# Patient Record
Sex: Male | Born: 1965
Health system: Southern US, Community
[De-identification: ages and names within clinical notes are randomized; demographics above are authoritative.]

## PROBLEM LIST (undated history)

## (undated) DIAGNOSIS — F1911 Other psychoactive substance abuse, in remission: Secondary | ICD-10-CM

## (undated) DIAGNOSIS — F32A Depression, unspecified: Secondary | ICD-10-CM

## (undated) DIAGNOSIS — N183 Chronic kidney disease, stage 3 (moderate): Secondary | ICD-10-CM

## (undated) DIAGNOSIS — Z9151 Personal history of suicidal behavior: Secondary | ICD-10-CM

## (undated) DIAGNOSIS — G8929 Other chronic pain: Secondary | ICD-10-CM

## (undated) DIAGNOSIS — M1990 Unspecified osteoarthritis, unspecified site: Secondary | ICD-10-CM

## (undated) DIAGNOSIS — I10 Essential (primary) hypertension: Secondary | ICD-10-CM

## (undated) DIAGNOSIS — N529 Male erectile dysfunction, unspecified: Secondary | ICD-10-CM

## (undated) DIAGNOSIS — Z915 Personal history of self-harm: Secondary | ICD-10-CM

## (undated) DIAGNOSIS — M549 Dorsalgia, unspecified: Secondary | ICD-10-CM

## (undated) HISTORY — DX: Personal history of suicidal behavior: Z91.51

## (undated) HISTORY — DX: Male erectile dysfunction, unspecified: N52.9

## (undated) HISTORY — PX: HAND SURGERY: SHX662

## (undated) HISTORY — DX: Other psychoactive substance abuse, in remission: F19.11

## (undated) HISTORY — DX: Personal history of self-harm: Z91.5

## (undated) HISTORY — DX: Other chronic pain: G89.29

## (undated) HISTORY — DX: Dorsalgia, unspecified: M54.9

## (undated) HISTORY — PX: ANKLE SURGERY: SHX546

## (undated) HISTORY — PX: LUMBAR EPIDURAL INJECTION: SHX1980

---

## 2012-11-26 ENCOUNTER — Emergency Department (HOSPITAL_COMMUNITY)
Admission: EM | Admit: 2012-11-26 | Discharge: 2012-11-26 | Disposition: A | Discharge status: Home / Self Care | Payer: PRIVATE HEALTH INSURANCE | Source: Home / Self Care | Attending: Family Medicine | Admitting: Family Medicine

## 2012-11-26 ENCOUNTER — Encounter (HOSPITAL_COMMUNITY): Payer: Self-pay | Admitting: Emergency Medicine

## 2012-11-26 DIAGNOSIS — J020 Streptococcal pharyngitis: Secondary | ICD-10-CM

## 2012-11-26 LAB — POCT RAPID STREP A: Streptococcus, Group A Screen (Direct): NEGATIVE

## 2012-11-26 MED ORDER — PREDNISONE 5 MG PO TABS: ORAL_TABLET | ORAL | Status: DC

## 2012-11-26 MED ORDER — OXYCODONE HCL 5 MG PO TABS: 5.0000 mg | ORAL_TABLET | ORAL | Status: DC | PRN

## 2012-11-26 MED ORDER — KETOROLAC TROMETHAMINE 60 MG/2ML IM SOLN
60.0000 mg | Freq: Once | INTRAMUSCULAR | Status: AC
  Administered 2012-11-26: 60 mg via INTRAMUSCULAR

## 2012-11-26 MED ORDER — AMOXICILLIN 875 MG PO TABS: 875.0000 mg | ORAL_TABLET | Freq: Two times a day (BID) | ORAL | Status: DC

## 2012-11-26 MED ORDER — DEXAMETHASONE SODIUM PHOSPHATE 10 MG/ML IJ SOLN
INTRAMUSCULAR | Status: AC
  Filled 2012-11-26: qty 1

## 2012-11-26 MED ORDER — KETOROLAC TROMETHAMINE 60 MG/2ML IM SOLN
INTRAMUSCULAR | Status: AC
  Filled 2012-11-26: qty 2

## 2012-11-26 MED ORDER — DEXAMETHASONE SODIUM PHOSPHATE 10 MG/ML IJ SOLN
10.0000 mg | Freq: Once | INTRAMUSCULAR | Status: AC
  Administered 2012-11-26: 10 mg via INTRAMUSCULAR

## 2012-11-26 NOTE — ED Provider Notes (Signed)
CSN: [id number]     Arrival date & time 11/26/12  1508 History   First MD Initiated Contact with Patient 11/26/12 1614     Chief Complaint  Patient presents with  . Sore Throat  . Otalgia   (Consider location/radiation/quality/duration/timing/severity/associated sxs/prior Treatment) HPI Comments: 47 year old male presents complaining of severe right-sided throat pain and ear pain. This began 3 days ago with left-sided stroke pain. The left-sided throat pain has somewhat resolved but now he is having severe and worsening pain on the right side.  The ear started hurting yesterday and has been getting worse. He is taken numerous over-the-counter cough and cold medications but they are not helping at all. Additionally, he admits to subjective fever. No rash, cough, shortness of breath, or sick contacts.  Patient is a 47 y.o. male presenting with pharyngitis and ear pain.  Sore Throat Pertinent negatives include no chest pain, no abdominal pain and no shortness of breath.  Otalgia Associated symptoms: sore throat   Associated symptoms: no abdominal pain, no cough, no diarrhea, no fever, no neck pain, no rash and no vomiting     History reviewed. No pertinent past medical history. History reviewed. No pertinent past surgical history. History reviewed. No pertinent family history. History  Substance Use Topics  . Smoking status: Current Some Day Smoker    Types: Cigarettes  . Smokeless tobacco: Not on file  . Alcohol Use: No    Review of Systems  Constitutional: Negative for fever, chills and fatigue.  HENT: Positive for ear pain and sore throat. Negative for neck pain and neck stiffness.   Eyes: Negative for visual disturbance.  Respiratory: Negative for cough and shortness of breath.   Cardiovascular: Negative for chest pain, palpitations and leg swelling.  Gastrointestinal: Negative for nausea, vomiting, abdominal pain, diarrhea and constipation.  Genitourinary: Negative for dysuria,  urgency, frequency and hematuria.  Musculoskeletal: Negative for myalgias and arthralgias.  Skin: Negative for rash.  Neurological: Negative for dizziness, weakness and light-headedness.    Allergies  Review of patient's allergies indicates no known allergies.  Home Medications   Current Outpatient Rx  Name  Route  Sig  Dispense  Refill  . amoxicillin (AMOXIL) 875 MG tablet   Oral   Take 1 tablet (875 mg total) by mouth 2 (two) times daily.   14 tablet   0   . oxyCODONE (ROXICODONE) 5 MG immediate release tablet   Oral   Take 1 tablet (5 mg total) by mouth every 4 (four) hours as needed for pain.   20 tablet   0   . predniSONE (DELTASONE) 5 MG tablet      5 tabs PO QD for 4 days;  3 tabs PO QD for 3 days;  1 tab PO QD until finished with prescription   30 tablet   0    BP 158/84  Pulse 58  Temp(Src) 98.3 F (36.8 C) (Oral)  Resp 16  SpO2 97% Physical Exam  Nursing note and vitals reviewed. Constitutional: He is oriented to person, place, and time. He appears well-developed and well-nourished. No distress.  HENT:  Head: Normocephalic and atraumatic.  Right Ear: Hearing, tympanic membrane, external ear and ear canal normal.  Left Ear: Hearing, tympanic membrane, external ear and ear canal normal.  Mouth/Throat: Oropharyngeal exudate (with erythema) present.  Neck: Normal range of motion. Neck supple.  Pulmonary/Chest: Effort normal. No respiratory distress.  Lymphadenopathy:    He has cervical adenopathy (Tonsillar).  Neurological: He is alert and oriented  to person, place, and time. Coordination normal.  Skin: Skin is warm and dry. No rash noted. He is not diaphoretic.  Psychiatric: He has a normal mood and affect. Judgment normal.    ED Course  Procedures (including critical care time) Labs Review Labs Reviewed  POCT RAPID STREP A ([facility] ONLY)   Imaging Review No results found.  MDM   1. Strep throat    This appears to be strep throat  although the strep test is negative. It is much worse on the right side than the left side but there is no unilateral swelling in the uvula is midline. I have advised him of symptoms to look out for if this should start become an abscess. He will take the medications as prescribed and will followup if he does not improve   Meds ordered this encounter  Medications  . dexamethasone (DECADRON) injection 10 mg    Sig:   . ketorolac (TORADOL) injection 60 mg    Sig:   . amoxicillin (AMOXIL) 875 MG tablet    Sig: Take 1 tablet (875 mg total) by mouth 2 (two) times daily.    Dispense:  14 tablet    Refill:  0  . predniSONE (DELTASONE) 5 MG tablet    Sig: 5 tabs PO QD for 4 days;  3 tabs PO QD for 3 days;  1 tab PO QD until finished with prescription    Dispense:  30 tablet    Refill:  0  . oxyCODONE (ROXICODONE) 5 MG immediate release tablet    Sig: Take 1 tablet (5 mg total) by mouth every 4 (four) hours as needed for pain.    Dispense:  20 tablet    Refill:  0       Graylon Good, PA-C 11/26/12 1752

## 2012-11-26 NOTE — ED Notes (Signed)
C/o ear pain. States left ear pain started on Friday and left side of throat. Saturday right ear pain and right sided throat pain. Monday swelling and pain with swallowing.  Denies fever and any other symptoms. Tried otc meds with no relief.

## 2012-11-26 NOTE — ED Provider Notes (Signed)
Medical screening examination/treatment/procedure(s) were performed by a resident physician or non-physician practitioner and as the supervising physician I was immediately available for consultation/collaboration.  Clementeen Graham, MD   Rodolph Bong, MD 11/26/12 2007

## 2012-11-26 NOTE — ED Notes (Signed)
Pt given injections will discharge at 4:40

## 2012-11-28 LAB — CULTURE, GROUP A STREP

## 2013-08-29 ENCOUNTER — Ambulatory Visit (INDEPENDENT_AMBULATORY_CARE_PROVIDER_SITE_OTHER): Payer: No Typology Code available for payment source | Admitting: Family Medicine

## 2013-08-29 VITALS — BP 140/90 | HR 68 | Temp 98.4°F | Resp 18 | Ht 78.0 in | Wt 197.4 lb

## 2013-08-29 DIAGNOSIS — N529 Male erectile dysfunction, unspecified: Secondary | ICD-10-CM

## 2013-08-29 MED ORDER — SILDENAFIL CITRATE 100 MG PO TABS: ORAL_TABLET | ORAL | Status: DC

## 2013-08-29 NOTE — Patient Instructions (Signed)
Good to see you today! Take care and come see [place] for a physical when you can.  You are welcome to make an appointment if that would be easier for you.  I gave you the 100mg  viagra tablets; this way you can break them into quarters and save money.  Let me know if you have any trouble with this medication such as chest pain.

## 2013-08-29 NOTE — Progress Notes (Signed)
[facility] [address], [place] [address] [phone]  Date:  08/29/2013   Name:  Benjamin [name]   DOB:  03-11-1966   MRN:  [id number]  PCP:  No primary provider on file.    Chief Complaint: Erectile Dysfunction   History of Present Illness:  Benjamin [name] is a 48 y.o. very pleasant male patient who presents with the following:  He is here today seeking an rx for viagra.  He has used this before with success  History of ankle surgery, otherwise he is a healthy person.   He does not have any heart problems, and no chest pain with exercise.  He exercises a lot and is in good shape.   He has had some ED since he was about 42.    He has generally used 25 mg in the past.  There are no active problems to display for this patient.   History reviewed. No pertinent past medical history.  Past Surgical History  Procedure Laterality Date  . Ankle surgery      History  Substance Use Topics  . Smoking status: Current Some Day Smoker    Types: Cigarettes  . Smokeless tobacco: Not on file  . Alcohol Use: No    No family history on file.  No Known Allergies  Medication list has been reviewed and updated.  Current Outpatient Prescriptions on File Prior to Visit  Medication Sig Dispense Refill  . amoxicillin (AMOXIL) 875 MG tablet Take 1 tablet (875 mg total) by mouth 2 (two) times daily.  14 tablet  0  . oxyCODONE (ROXICODONE) 5 MG immediate release tablet Take 1 tablet (5 mg total) by mouth every 4 (four) hours as needed for pain.  20 tablet  0  . predniSONE (DELTASONE) 5 MG tablet 5 tabs PO QD for 4 days;  3 tabs PO QD for 3 days;  1 tab PO QD until finished with prescription  30 tablet  0   No current facility-administered medications on file prior to visit.    Review of Systems:  As per HPI- otherwise negative.   Physical Examination: Filed Vitals:   08/29/13 1500  BP: 140/90  Pulse: 68  Temp: 98.4 F (36.9 C)  Resp: 18   Filed  Vitals:   08/29/13 1500  Height: 6\' 6"  (1.981 m)  Weight: 197 lb 6.4 oz (89.54 kg)   Body mass index is 22.82 kg/(m^2). Ideal Body Weight: Weight in (lb) to have BMI = 25: 215.9  GEN: WDWN, NAD, Non-toxic, A & O x 3, looks well, fit build HEENT: Atraumatic, Normocephalic. Neck supple. No masses, No LAD. Ears and Nose: No external deformity. CV: RRR, No M/G/R. No JVD. No thrill. No extra heart sounds. PULM: CTA B, no wheezes, crackles, rhonchi. No retractions. No resp. distress. No accessory muscle use. EXTR: No c/c/e NEURO Normal gait.  PSYCH: Normally interactive. Conversant. Not depressed or anxious appearing.  Calm demeanor.  Defer GU exam as he plans CPE soon.  He denies any discharge, penile malformation or other concern  Assessment and Plan: Erectile dysfunction - Plan: sildenafil (VIAGRA) 100 MG tablet  Refilled viagra for ED.  He has been using this with success.  He will come and see [place] for a CPE soon.   Gave 100mg  tablets to help save money, he can break them into quarters or halves as necessary   Signed Abbe Amsterdam, MD

## 2013-12-05 ENCOUNTER — Ambulatory Visit (INDEPENDENT_AMBULATORY_CARE_PROVIDER_SITE_OTHER): Payer: No Typology Code available for payment source | Admitting: Family Medicine

## 2013-12-05 VITALS — BP 118/74 | HR 55 | Temp 97.8°F | Resp 18 | Ht 73.0 in | Wt 187.0 lb

## 2013-12-05 DIAGNOSIS — J029 Acute pharyngitis, unspecified: Secondary | ICD-10-CM

## 2013-12-05 LAB — POCT RAPID STREP A (OFFICE): Rapid Strep A Screen: NEGATIVE

## 2013-12-05 MED ORDER — TRAMADOL HCL 50 MG PO TABS: 50.0000 mg | ORAL_TABLET | Freq: Three times a day (TID) | ORAL | Status: DC | PRN

## 2013-12-05 MED ORDER — CEFDINIR 300 MG PO CAPS: 600.0000 mg | ORAL_CAPSULE | Freq: Every day | ORAL | Status: DC

## 2013-12-05 NOTE — Progress Notes (Addendum)
[facility] [address] [place] [address] [phone]- [phone]  Date:  12/05/2013   Name:  Benjamin [name]   DOB:  [birth year]   MRN:  [id number]  PCP:  No PCP Per Patient    Chief Complaint: Sore Throat   History of Present Illness:  Benjamin [name] is a 49 y.o. very pleasant male patient who presents with the following:  Here today with concern of possible strep throat.  He has noted a ST for a couple of days.  Last year he was seen at the [facility] and had a negative strep and negative culture. However he was under the impression that he did have strep then.  He was treated with steroids and abx and got better.   He has not noted a fever but feels quite bad.  He has not vomited but did have diarrhea a couple of days ago He is othewise generally healthy.  Not aware of any sick contacts.    There are no active problems to display for this patient.   History reviewed. No pertinent past medical history.  Past Surgical History  Procedure Laterality Date  . Ankle surgery      History  Substance Use Topics  . Smoking status: Current Some Day Smoker    Types: Cigarettes  . Smokeless tobacco: Not on file  . Alcohol Use: No    History reviewed. No pertinent family history.  No Known Allergies  Medication list has been reviewed and updated.  Current Outpatient Prescriptions on File Prior to Visit  Medication Sig Dispense Refill  . sildenafil (VIAGRA) 100 MG tablet Take 25- 100 mg once a day as needed.  10 tablet  11   No current facility-administered medications on file prior to visit.    Review of Systems:  As per HPI- otherwise negative.   Physical Examination: Filed Vitals:   12/05/13 1512  BP: 118/74  Pulse: 49  Temp: 97.8 F (36.6 C)  Resp: 18   Filed Vitals:   12/05/13 1512  Height:  (1.854 m)  Weight: 187 lb (84.823 kg)   Body mass index is 24.68 kg/(m^2). Ideal Body Weight: Weight in (lb) to have BMI = 25: 189.1  GEN: WDWN, NAD,  Non-toxic, A & O x 3, very fit, looks well HEENT: Atraumatic, Normocephalic. Neck supple. No masses, tender but small LAD in bilateral neck.  Bilateral TM wnl, oropharynx shows left sided exudate and erythema.   PEERL,EOMI.   Ears and Nose: No external deformity. CV: RRR, No M/G/R. No JVD. No thrill. No extra heart sounds. PULM: CTA B, no wheezes, crackles, rhonchi. No retractions. No resp. distress. No accessory muscle use. ABD: S, NT, ND EXTR: No c/c/e NEURO Normal gait.  PSYCH: Normally interactive. Conversant. Not depressed or anxious appearing.  Calm demeanor.   Results for orders placed in visit on 12/05/13  POCT RAPID STREP A (OFFICE)      Result Value Ref Range   Rapid Strep A Screen Negative  Negative    Assessment and Plan: Acute pharyngitis, unspecified pharyngitis type - Plan: POCT rapid strep A, Culture, Group A Strep, cefdinir (OMNICEF) 300 MG capsule, traMADol (ULTRAM) 50 MG tablet   Possible strep pharyngitis with exudate although rapid is negative.  Tramadol for pain, omnicef for tx.  Await culture Signed Abbe Amsterdam, MD  9/14; called to give him throat culture results. Culture negative for strep. He is somewhat better but still has pain.  He will let me know  if not improving soon

## 2013-12-05 NOTE — Patient Instructions (Signed)
Use the omnicef for possible strep throat. I will be in touch with your culture asap.  Use the tramadol as needed for pain- it may cause you to feel a bit drowsy.  You can also use ibuprofen or tylenol as needed.  Try some chloraseptic spray OTC as well.    Let us know if you do not feel better in the next few days!

## 2013-12-07 LAB — CULTURE, GROUP A STREP: ORGANISM ID, BACTERIA: NORMAL

## 2014-01-31 ENCOUNTER — Other Ambulatory Visit: Payer: Self-pay | Admitting: Family Medicine

## 2014-01-31 ENCOUNTER — Ambulatory Visit (INDEPENDENT_AMBULATORY_CARE_PROVIDER_SITE_OTHER): Payer: No Typology Code available for payment source | Admitting: Family Medicine

## 2014-01-31 VITALS — BP 138/82 | HR 62 | Temp 98.1°F | Resp 16 | Ht 72.5 in | Wt 191.0 lb

## 2014-01-31 DIAGNOSIS — N529 Male erectile dysfunction, unspecified: Secondary | ICD-10-CM | POA: Insufficient documentation

## 2014-01-31 MED ORDER — SILDENAFIL CITRATE 100 MG PO TABS: 50.0000 mg | ORAL_TABLET | Freq: Every day | ORAL | Status: DC | PRN

## 2014-01-31 NOTE — Progress Notes (Signed)
   Subjective:    Patient ID: Benjamin HaagensenJohn [name], male    DOB: [birth year], 48 y.o.   MRN: [id number] Patient Active Problem List   Diagnosis Date Noted  . Erectile dysfunction 01/31/2014   Prior to Admission medications   Medication Sig Start Date End Date Taking? Authorizing Provider  sildenafil (VIAGRA) 100 MG tablet Take 0.5-1 tablets (50-100 mg total) by mouth daily as needed for erectile dysfunction. 01/31/14   Dorna LeitzNicole Bush V, PA-C   No Known Allergies  HPI  This is a 48 year old male with PMH ED who presents today for a medication refill of viagra. He saw Dr. Patsy Lageropland in June 2015 for this issue and was prescribed viagra with 11 refills. He reports when he went to the pharmacy they told him they couldn't fill this prescription for some reason because of "something with the computer". He does not have any chest pain or SOB at rest or with exercise. He has had ED since age 48 and viagra has worked well for him.  Review of Systems  Constitutional: Negative.   Respiratory: Negative for shortness of breath.   Cardiovascular: Negative.       Objective:   Physical Exam  Constitutional: He is oriented to person, place, and time. He appears well-developed and well-nourished. No distress.  HENT:  Head: Normocephalic and atraumatic.  Right Ear: Hearing normal.  Left Ear: Hearing normal.  Nose: Nose normal.  Eyes: Conjunctivae and lids are normal. Right eye exhibits no discharge. Left eye exhibits no discharge. No scleral icterus.  Pulmonary/Chest: Effort normal. No respiratory distress.  Musculoskeletal: Normal range of motion.  Neurological: He is alert and oriented to person, place, and time.  Skin: Skin is warm, dry and intact. No lesion and no rash noted.  Psychiatric: He has a normal mood and affect. His speech is normal and behavior is normal. Thought content normal.   BP 138/82 mmHg  Pulse 62  Temp(Src) 98.1 F (36.7 C) (Oral)  Resp 16  Ht 6' 0.5" (1.842 m)  Wt 191 lb (86.637  kg)  BMI 25.53 kg/m2  SpO2 97%     Assessment & Plan:  1. Erectile dysfunction, unspecified erectile dysfunction type Unsure why he had a problem filling this medication. Discontinued old prescription and gave new prescription with 11 refills. He will call with any problems/concerns.  - sildenafil (VIAGRA) 100 MG tablet; Take 0.5-1 tablets (50-100 mg total) by mouth daily as needed for erectile dysfunction.  Dispense: 10 tablet; Refill: 11   Nicole V. Dyke BrackettBush, PA-C, MHS [facility]  01/31/2014

## 2014-01-31 NOTE — Patient Instructions (Signed)
If you have any issues getting this filled, let me know.

## 2014-01-31 NOTE — Telephone Encounter (Signed)
Pt is requesting a refill of viagra.

## 2014-02-02 NOTE — Telephone Encounter (Signed)
Pt came in for OV and med was sent.

## 2014-02-14 NOTE — Progress Notes (Signed)
Reviewed documentation and agree w/ assessment and plan. Eva Shaw, MD MPH 

## 2014-07-23 ENCOUNTER — Ambulatory Visit: Payer: No Typology Code available for payment source | Admitting: Medical

## 2014-07-28 ENCOUNTER — Encounter: Payer: Self-pay | Admitting: Medical

## 2014-07-28 ENCOUNTER — Ambulatory Visit (INDEPENDENT_AMBULATORY_CARE_PROVIDER_SITE_OTHER): Payer: 59 | Admitting: Medical

## 2014-07-28 VITALS — BP 100/70 | HR 63 | Temp 98.0°F | Resp 16 | Ht 73.0 in | Wt 187.0 lb

## 2014-07-28 DIAGNOSIS — S3992XA Unspecified injury of lower back, initial encounter: Secondary | ICD-10-CM

## 2014-07-28 DIAGNOSIS — M545 Low back pain: Secondary | ICD-10-CM | POA: Diagnosis not present

## 2014-07-28 DIAGNOSIS — M549 Dorsalgia, unspecified: Secondary | ICD-10-CM | POA: Diagnosis not present

## 2014-07-28 MED ORDER — TRAMADOL HCL 50 MG PO TABS: 50.0000 mg | ORAL_TABLET | Freq: Four times a day (QID) | ORAL | Status: DC | PRN

## 2014-07-28 NOTE — Progress Notes (Signed)
Subjective: Here as a new patient.  From [place], been here in [place] about a year.  He notes back issues.  He notes injury from 15 years ago.   Had original injury doing dead lifts 15 years ago, had pinched nerve.  Another time in the past he was doing squats, shifting wrong, and was out of commission 1 week.  He is a Systems analystpersonal trainer, does exercise, weight lifting, training daily.   He is use to soreness, aches, but about 2 months ago while doing squats thinks he shifted wrong and since then has been down in his back.  Having to roll out of bed, having to knee down without bending at the hips when picking something up  Has decreased ROM and constant pain in mid and low back x 53mo  Gets some radiation of pain down into right anterior thigh and at times the hip.  No reported numbness, tingling or weakness of legs.  In the past with the initial injury was on NSAIDs and muscle relaxers x 2+ years to the point that liver and kidney labs became abnormal, so he is hesitant to reuse those medications.   He denies prior back surgery or procedure, no prior imaging . No other aggravating or relieving factors. No other complaint.  ROS as in subjective  Objective: BP 100/70 mmHg  Pulse 63  Temp(Src) 98 F (36.7 C) (Oral)  Resp 16  Ht 6\' 1"  (1.854 m)  Wt 187 lb (84.823 kg)  BMI 24.68 kg/m2   General appearance: alert, no distress, WD/WN, muscular AA male Neck: supple, no lymphadenopathy, no thyromegaly, no masses Heart: RRR, normal S1, S2, no murmurs Lungs: CTA bilaterally, no wheezes, rhonchi, or rales Abdomen: +bs, soft, non tender, non distended, no masses, no hepatomegaly, no splenomegaly Back:tender midline mid to low thoracic spine and lumbar spine midline, unable to do much flexion or extension due to pain.  Possible slight scoliosis to the left MSK: hips and legs nontender, no deformity, normal ROM Pulses: 2+ symmetric, upper and lower extremities, normal cap refill Neuro: normal LE  strength sensation and DTRs, normal heel and toe walk    Assessment: Encounter Diagnoses  Name Primary?  . Mid back pain Yes  . Low back pain without sciatica, unspecified back pain laterality   . Back injury, initial encounter    Plan: Discussed prior and current injuries, exam findings.   Go for T and L spine xrays.   Advised relative rest, gentle stretching, and f/u pending xray.

## 2014-07-29 ENCOUNTER — Other Ambulatory Visit: Payer: Self-pay

## 2014-07-29 ENCOUNTER — Ambulatory Visit
Admission: RE | Admit: 2014-07-29 | Discharge: 2014-07-29 | Disposition: A | Discharge status: Home / Self Care | Payer: 59 | Source: Ambulatory Visit | Attending: Medical | Admitting: Medical

## 2014-07-29 ENCOUNTER — Encounter: Payer: Self-pay | Admitting: Medical

## 2014-07-29 DIAGNOSIS — S3992XA Unspecified injury of lower back, initial encounter: Secondary | ICD-10-CM

## 2014-07-29 DIAGNOSIS — M549 Dorsalgia, unspecified: Secondary | ICD-10-CM

## 2014-07-29 DIAGNOSIS — M545 Low back pain: Secondary | ICD-10-CM

## 2014-07-30 ENCOUNTER — Telehealth: Payer: Self-pay | Admitting: Family Medicine

## 2014-07-30 NOTE — Telephone Encounter (Signed)
Patient called and I advised of his imaging findings.  Pt does want to go ahead with MRI.  Pt also request another rx.  That the one you gave him helped some, but wants to try something else.  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  Pt pharm is [facility] on [address].  Please advise pt if you will call something in for him [phone]

## 2014-07-30 NOTE — Progress Notes (Signed)
LM to CB

## 2014-08-02 ENCOUNTER — Other Ambulatory Visit: Payer: Self-pay | Admitting: Medical

## 2014-08-02 MED ORDER — METHOCARBAMOL 500 MG PO TABS: 500.0000 mg | ORAL_TABLET | Freq: Four times a day (QID) | ORAL | Status: DC

## 2014-08-02 NOTE — Telephone Encounter (Signed)
I sent Robaxin muscle relaxer to try initially at bedtime.  If it doesn't make him sleepy, he could try it doing the day time if needed.  This is as needed medication.   I would recommend he begin Aleve OTC BID for the time being as well.  Go ahead and set up for MRI Lumbar spine without contrast

## 2014-08-03 ENCOUNTER — Other Ambulatory Visit: Payer: Self-pay | Admitting: Family Medicine

## 2014-08-03 DIAGNOSIS — M545 Low back pain: Secondary | ICD-10-CM

## 2014-08-03 NOTE — Telephone Encounter (Signed)
I called the patient to make him aware that [facility] will call him to ask him some questions and they will be the one's to set him up for his MRI l-spine.

## 2014-08-03 NOTE — Telephone Encounter (Signed)
Patient is aware of the message from [facility] and I am working on his MRI appointment. CLS

## 2014-08-04 ENCOUNTER — Other Ambulatory Visit: Payer: Self-pay | Admitting: Family Medicine

## 2014-08-04 DIAGNOSIS — M5489 Other dorsalgia: Secondary | ICD-10-CM

## 2014-08-06 ENCOUNTER — Other Ambulatory Visit: Payer: 59

## 2014-08-06 ENCOUNTER — Telehealth: Payer: Self-pay | Admitting: Medical

## 2014-08-06 ENCOUNTER — Other Ambulatory Visit: Payer: Self-pay | Admitting: Medical

## 2014-08-06 MED ORDER — NAPROXEN 500 MG PO TABS: 500.0000 mg | ORAL_TABLET | Freq: Two times a day (BID) | ORAL | Status: DC

## 2014-08-06 NOTE — Telephone Encounter (Signed)
Pt had to reschedule CT scan that was supposed to be today to the 18th  because it had not been approved by insurance  Pt states muscle relaxers are not helping , they just make him sleepy. He needs to work and he is in a lot of pain

## 2014-08-06 NOTE — Telephone Encounter (Signed)
Vincenza HewsShane, I spent 15 plus minutes on the phone and they would not approve the CT, it is going to medical/physician review and the answer will be given by 5 pm Monday via fax. If you want to call them the reference number is [id number] @ [phone]

## 2014-08-06 NOTE — Telephone Encounter (Signed)
I added Naprosyn BID, so begin this, and he can still use occasional Ultram for pain control as well.

## 2014-08-06 NOTE — Telephone Encounter (Signed)
I'm confused, I thought we were ordering MRI L spine.  Please help me make sure we are ordering the MRI originally discussed?  Make sure he is aware of the delay

## 2014-08-07 NOTE — Telephone Encounter (Signed)
Vincenza HewsShane, I talk with you about this. The patient was ask the question from the MRI pre screening questioner and he said yes to buck shot to the right hand so the radiologist said that he couldn't have the MRI he had to have a CT. I tried to get it approved but they wouldn't approve over the phone it had to go for further reviewing. Patient is aware of this situation as well.

## 2014-08-11 ENCOUNTER — Telehealth: Payer: Self-pay | Admitting: Medical

## 2014-08-11 NOTE — Telephone Encounter (Signed)
I fax the referral over to [facility] and they will contact the patient for his appointment

## 2014-08-11 NOTE — Telephone Encounter (Signed)
What something for his pain? Aleve OTC is not working

## 2014-08-11 NOTE — Telephone Encounter (Signed)
I spoke with this patient and went over the message from [facility] and he understands. Patient would like to see a back specialists. I will work on the referral Patient states that Aleve OTC is not helping his pain. Patient is requesting something to help with pain.

## 2014-08-11 NOTE — Telephone Encounter (Signed)
We usually don't have this much problem with insurance.  Insurance has requested chart review and we have sent records.    The only other option we can do while waiting on word from insurance company about approval of CT is referral to back specialist or referral to physical therapy.  See if he wants to pursue either of these options?

## 2014-08-11 NOTE — Telephone Encounter (Signed)
Go with [facility]

## 2014-08-11 NOTE — Telephone Encounter (Signed)
Pt says [facility] says his insurance will not authorize for him to have the CT done so pt wants to know what he should do from this point because he is in a lot of pain

## 2014-08-12 ENCOUNTER — Inpatient Hospital Stay: Admission: RE | Admit: 2014-08-12 | Payer: 59 | Source: Ambulatory Visit

## 2014-08-12 ENCOUNTER — Other Ambulatory Visit: Payer: Self-pay | Admitting: Medical

## 2014-08-12 MED ORDER — TRAMADOL HCL 50 MG PO TABS: 50.0000 mg | ORAL_TABLET | Freq: Four times a day (QID) | ORAL | Status: DC | PRN

## 2014-08-12 NOTE — Telephone Encounter (Signed)
Patient is aware that his RX is ready for pick up.

## 2014-08-12 NOTE — Telephone Encounter (Signed)
I have prescribed several things already.  He can use the Naprosyn BID, ultram 1-2 tablets prn q 6 hours, and robaxin QHS.  Is he out of Ultram?    C/t with referral

## 2014-08-12 NOTE — Telephone Encounter (Signed)
The patient told me that he is out of the Tramadol.

## 2014-08-12 NOTE — Telephone Encounter (Signed)
rx ready 

## 2014-08-18 ENCOUNTER — Telehealth: Payer: Self-pay | Admitting: Family Medicine

## 2014-08-18 NOTE — Telephone Encounter (Signed)
[employer] online referral was completed. Reference # [id number] To [facility] Fax #[phone]

## 2014-09-03 ENCOUNTER — Telehealth: Payer: Self-pay | Admitting: Medical

## 2014-09-03 NOTE — Telephone Encounter (Signed)
We referred to specialist and I received the note.  They did give him steroid taper.  I can't prescribe him pain medication.  He can call them back and ask for something else to help the pain though.

## 2014-09-03 NOTE — Telephone Encounter (Signed)
Pt called and stated that he went to see [facility] yesterday. They did not give him anything for pain. He states that he is still in a lot of pain. He is requesting something a little stronger that last time. Pt can be reached at [phone] and [facility] [address]

## 2014-09-03 NOTE — Telephone Encounter (Signed)
LM to CB

## 2014-09-07 ENCOUNTER — Other Ambulatory Visit: Payer: Self-pay | Admitting: Orthopaedic Surgery

## 2014-09-07 DIAGNOSIS — M4716 Other spondylosis with myelopathy, lumbar region: Secondary | ICD-10-CM

## 2014-09-10 NOTE — Telephone Encounter (Signed)
Patient notified

## 2014-09-22 ENCOUNTER — Inpatient Hospital Stay: Admission: RE | Admit: 2014-09-22 | Payer: 59 | Source: Ambulatory Visit

## 2014-09-30 ENCOUNTER — Ambulatory Visit
Admission: RE | Admit: 2014-09-30 | Discharge: 2014-09-30 | Disposition: A | Discharge status: Home / Self Care | Payer: 59 | Source: Ambulatory Visit | Attending: Orthopaedic Surgery | Admitting: Orthopaedic Surgery

## 2014-09-30 DIAGNOSIS — M4716 Other spondylosis with myelopathy, lumbar region: Secondary | ICD-10-CM

## 2014-10-05 ENCOUNTER — Telehealth: Payer: Self-pay

## 2014-10-05 ENCOUNTER — Telehealth: Payer: Self-pay | Admitting: *Deleted

## 2014-10-05 NOTE — Telephone Encounter (Signed)
Joy with [facility] called. Patient has seen Dr.Cohen in their office but will be getting his first spinal injection with Dr.Thomas Sallow on 10/19/14. Needs referral done [facility]([employer]) for consult with this doctor, even though they are in same office. Is this okay? If so, send back to me and I will do.   ICD-10:M47.16 [phone]:[phone]

## 2014-10-05 NOTE — Telephone Encounter (Signed)
Patient left message on VM with questions about referral, the number he left is incorrect and the other number does not have a VM set up.

## 2014-10-05 NOTE — Telephone Encounter (Signed)
That is ok, refer

## 2014-10-07 NOTE — Telephone Encounter (Signed)
[employer] online referral was done and [facility]

## 2014-10-17 ENCOUNTER — Other Ambulatory Visit: Payer: Self-pay | Admitting: Physician Assistant

## 2014-11-03 ENCOUNTER — Encounter: Payer: Self-pay | Admitting: Medical

## 2014-11-03 ENCOUNTER — Ambulatory Visit (INDEPENDENT_AMBULATORY_CARE_PROVIDER_SITE_OTHER): Payer: 59 | Admitting: Medical

## 2014-11-03 VITALS — BP 128/80 | HR 68 | Temp 98.0°F | Resp 16 | Ht 74.0 in | Wt 200.0 lb

## 2014-11-03 DIAGNOSIS — Z202 Contact with and (suspected) exposure to infections with a predominantly sexual mode of transmission: Secondary | ICD-10-CM

## 2014-11-03 DIAGNOSIS — F1520 Other stimulant dependence, uncomplicated: Secondary | ICD-10-CM

## 2014-11-03 DIAGNOSIS — R51 Headache: Secondary | ICD-10-CM

## 2014-11-03 DIAGNOSIS — Z113 Encounter for screening for infections with a predominantly sexual mode of transmission: Secondary | ICD-10-CM | POA: Diagnosis not present

## 2014-11-03 DIAGNOSIS — R519 Headache, unspecified: Secondary | ICD-10-CM

## 2014-11-03 LAB — LIPID PANEL
Cholesterol: 223 mg/dL — ABNORMAL HIGH (ref 125–200)
HDL: 82 mg/dL (ref >=40)
LDL CALC: 111 mg/dL (ref <130)
Total CHOL/HDL Ratio: 2.7 Ratio (ref <=5.0)
Triglycerides: 150 mg/dL — ABNORMAL HIGH (ref <150)
VLDL: 30 mg/dL (ref <30)

## 2014-11-03 LAB — COMPREHENSIVE METABOLIC PANEL
ALT: 41 U/L (ref 9–46)
AST: 39 U/L (ref 10–40)
Albumin: 4.7 g/dL (ref 3.6–5.1)
Alkaline Phosphatase: 36 U/L — ABNORMAL LOW (ref 40–115)
BUN: 20 mg/dL (ref 7–25)
CO2: 26 mmol/L (ref 20–31)
CREATININE: 1.71 mg/dL — AB (ref 0.60–1.35)
Calcium: 9.5 mg/dL (ref 8.6–10.3)
Chloride: 103 mmol/L (ref 98–110)
Glucose, Bld: 106 mg/dL — ABNORMAL HIGH (ref 65–99)
Potassium: 4.5 mmol/L (ref 3.5–5.3)
Sodium: 137 mmol/L (ref 135–146)
TOTAL PROTEIN: 6.9 g/dL (ref 6.1–8.1)
Total Bilirubin: 2.1 mg/dL — ABNORMAL HIGH (ref 0.2–1.2)

## 2014-11-03 LAB — POCT URINALYSIS DIPSTICK
BILIRUBIN UA: NEGATIVE
GLUCOSE UA: NEGATIVE
KETONES UA: NEGATIVE
Leukocytes, UA: NEGATIVE
Nitrite, UA: NEGATIVE
Protein, UA: 0.15
RBC UA: NEGATIVE
Spec Grav, UA: 1.015
pH, UA: 7.5

## 2014-11-03 LAB — CBC
HCT: 40.8 % (ref 39.0–52.0)
HEMOGLOBIN: 13.6 g/dL (ref 13.0–17.0)
MCH: 24.7 pg — ABNORMAL LOW (ref 26.0–34.0)
MCHC: 33.3 g/dL (ref 30.0–36.0)
MCV: 74 fL — AB (ref 78.0–100.0)
MPV: 10 fL (ref 8.6–12.4)
PLATELETS: 179 10*3/uL (ref 150–400)
RBC: 5.51 MIL/uL (ref 4.22–5.81)
RDW: 15 % (ref 11.5–15.5)
WBC: 8.5 10*3/uL (ref 4.0–10.5)

## 2014-11-03 LAB — TSH: TSH: 0.416 u[IU]/mL (ref 0.350–4.500)

## 2014-11-03 NOTE — Progress Notes (Signed)
Subjective: Here for headaches and STD screen  He notes some intermittent history of headaches from mid 20s, but in recent weeks having frequent headaches, typically every other day.  Not severe ,but can't last hours to a few days.   Typically headaches are bitemporal typically, lasts for achy, but no photophobia, no phonophobia, no nausea, vomiting, paresthesia or vision changes or hearing changes.  Uses rest of OTC analgesics prn.   Sleep is ok.  He does drink 3 cups of coffee daily, soda as well.  Drinks alcohol, few beers every other day.    doesn't skip meals.  Not overly stressed.     He also wants STD screening, has some concerns, but no symptoms.   Sexually active, uses condoms.  Review of Systems Constitutional: -fever, -chills, -sweats, -unexpected weight change,-fatigue ENT: -runny nose, -ear pain, -sore throat Cardiology:  -chest pain, -palpitations, -edema Respiratory: -cough, -shortness of breath, -wheezing Gastroenterology: -abdominal pain, -nausea, -vomiting, -diarrhea, -constipation Hematology: -bleeding or bruising problems Musculoskeletal: -arthralgias, -myalgias, -joint swelling, -back pain Ophthalmology: -vision changes Urology: -dysuria, -difficulty urinating, -hematuria, -urinary frequency, -urgency Neurology:  -weakness, -tingling, -numbness   Past Medical History  Diagnosis Date  . Erectile dysfunction     Objective: BP 128/80 mmHg  Pulse 68  Temp(Src) 98 F (36.7 C) (Oral)  Resp 16  Ht  (1.88 m)  Wt 200 lb (90.719 kg)  BMI 25.67 kg/m2   General appearance: alert, no distress, WD/WN, AA male HEENT: normocephalic, sclerae anicteric, PERRLA, EOMi, nares patent, no discharge or erythema, pharynx normal Oral cavity: MMM, no lesions Neck: supple, no lymphadenopathy, no thyromegaly, no masses, no bruits Heart: RRR, normal S1, S2, no murmurs Lungs: CTA bilaterally, no wheezes, rhonchi, or rales Extremities: no edema, no cyanosis, no clubbing Pulses: 2+  symmetric, upper and lower extremities, normal cap refill Neurological: alert, oriented x 3, CN2-12 intact, strength normal upper extremities and lower extremities, sensation normal throughout, DTRs 2+ throughout, no cerebellar signs, gait normal Psychiatric: normal affect, behavior normal, pleasant    Assessment Encounter Diagnoses  Name Primary?  . Frequent headaches Yes  . Screen for STD (sexually transmitted disease)   . Venereal disease contact   . Caffeine dependence     Plan: Labs today.  discussed safe sex, prevention of STD.  discussed headaches.  He likely has a secondly headache syndrome, possibly related to excess caffeine, alcohol and NSAID use.   He has been seeing back specialist, and has been using some pain medications through them, so could even have some rebound issues.  discussed red flag symptoms that he doesn't have.   F/u pending labs.

## 2014-11-04 LAB — HIV ANTIBODY (ROUTINE TESTING W REFLEX): HIV: NONREACTIVE

## 2014-11-04 LAB — RPR

## 2014-11-04 LAB — GC/CHLAMYDIA PROBE AMP
CT PROBE, AMP APTIMA: NEGATIVE
GC PROBE AMP APTIMA: NEGATIVE

## 2014-11-05 ENCOUNTER — Other Ambulatory Visit: Payer: Self-pay | Admitting: Medical

## 2014-11-05 DIAGNOSIS — R17 Unspecified jaundice: Secondary | ICD-10-CM

## 2014-11-05 DIAGNOSIS — R7989 Other specified abnormal findings of blood chemistry: Secondary | ICD-10-CM

## 2014-11-24 ENCOUNTER — Encounter: Payer: Self-pay | Admitting: Medical

## 2014-12-19 ENCOUNTER — Encounter (HOSPITAL_COMMUNITY): Payer: Self-pay | Admitting: *Deleted

## 2014-12-19 ENCOUNTER — Emergency Department (INDEPENDENT_AMBULATORY_CARE_PROVIDER_SITE_OTHER)
Admission: EM | Admit: 2014-12-19 | Discharge: 2014-12-19 | Disposition: A | Discharge status: Home / Self Care | Payer: 59 | Source: Home / Self Care | Attending: Family Medicine | Admitting: Family Medicine

## 2014-12-19 DIAGNOSIS — A049 Bacterial intestinal infection, unspecified: Secondary | ICD-10-CM | POA: Diagnosis not present

## 2014-12-19 LAB — POCT URINALYSIS DIP (DEVICE)
BILIRUBIN URINE: NEGATIVE
GLUCOSE, UA: NEGATIVE mg/dL
Hgb urine dipstick: NEGATIVE
Ketones, ur: NEGATIVE mg/dL
LEUKOCYTES UA: NEGATIVE
NITRITE: NEGATIVE
PH: 6 (ref 5.0–8.0)
Protein, ur: NEGATIVE mg/dL
Specific Gravity, Urine: >=1.03 (ref 1.005–1.030)
Urobilinogen, UA: 0.2 mg/dL (ref 0.0–1.0)

## 2014-12-19 MED ORDER — CIPROFLOXACIN HCL 500 MG PO TABS: 500.0000 mg | ORAL_TABLET | Freq: Two times a day (BID) | ORAL | Status: DC

## 2014-12-19 MED ORDER — ALIGN 4 MG PO CAPS
1.0000 | ORAL_CAPSULE | Freq: Every day | ORAL | Status: DC
Start: 2014-12-19 — End: 2015-08-13

## 2014-12-19 NOTE — ED Provider Notes (Signed)
CSN: [id number]     Arrival date & time 12/19/14  1721 History   First MD Initiated Contact with Patient 12/19/14 1830     Chief Complaint  Patient presents with  . Abdominal Pain  . Diarrhea   (Consider location/radiation/quality/duration/timing/severity/associated sxs/prior Treatment)  HPI   The is a 49 year old male presenting tonight with complaints of abdominal pain and loose watery stools for the past 12-13 days. Patient states that he "ate a loose meat sandwich that he should've known better about" and that night experienced significant sharp abdominal pains with copious diarrhea. Patient denies blood in his stool or dark tarry stools. Patient states he did have some spots of bright red blood on the toilet tissue that he felt was consistent with the chronic diarrhea. Patient reports the abdominal pain is generalized and significantly worse whenever he eats. Denies nausea or vomiting.  Past Medical History  Diagnosis Date  . Erectile dysfunction    Past Surgical History  Procedure Laterality Date  . Ankle surgery     No family history on file. Social History  Substance Use Topics  . Smoking status: Current Every Day Smoker    Types: Cigarettes  . Smokeless tobacco: None  . Alcohol Use: Yes     Comment: occasional    Review of Systems  Constitutional: Negative.  Negative for fever and chills.  HENT: Negative.  Negative for sinus pressure, sneezing and sore throat.   Eyes: Negative.   Respiratory: Negative.  Negative for shortness of breath.   Cardiovascular: Negative.   Gastrointestinal: Positive for abdominal pain and diarrhea. Negative for nausea, vomiting, constipation, blood in stool, abdominal distention, anal bleeding and rectal pain.  Endocrine: Negative.   Genitourinary: Negative.  Negative for dysuria, hematuria, flank pain and genital sores.  Musculoskeletal: Negative.   Skin: Negative.  Negative for pallor and rash.  Allergic/Immunologic: Negative.    Neurological: Negative.   Hematological: Negative.   Psychiatric/Behavioral: Negative.     Allergies  Review of patient's allergies indicates no known allergies.  Home Medications   Prior to Admission medications   Medication Sig Start Date End Date Taking? Authorizing Provider  ciprofloxacin (CIPRO) 500 MG tablet Take 1 tablet (500 mg total) by mouth 2 (two) times daily. 12/19/14   Servando Salina, NP  HYDROcodone-acetaminophen (NORCO) 10-325 MG per tablet Take 1 tablet by mouth every 6 (six) hours as needed.    Historical Provider, MD  Probiotic Product (ALIGN) 4 MG CAPS Take 1 capsule by mouth daily. 12/19/14   Servando Salina, NP  traMADol (ULTRAM) 50 MG tablet Take 1 tablet (50 mg total) by mouth every 6 (six) hours as needed. 08/12/14   Kermit Balo Tysinger, PA-C  VIAGRA 100 MG tablet TAKE 1/2 TO 1 TABLET BY MOUTH EVERY DAY AS NEEDED FOR ERECTILE DYSFUNCTION 10/22/14   Dorna Leitz, PA-C   Meds Ordered and Administered this Visit  Medications - No data to display  BP 136/75 mmHg  Pulse 60  Temp(Src) 98.3 F (36.8 C) (Oral)  Resp 18  SpO2 96% No data found.   Physical Exam  Constitutional: He appears well-developed and well-nourished. No distress.  HENT:  Oropharynx clear and moist.  Neck: Normal range of motion. Neck supple.  Negative for nuchal rigidity.  Cardiovascular: Normal rate, regular rhythm, normal heart sounds and intact distal pulses.  Exam reveals no gallop and no friction rub.   No murmur heard. Pulmonary/Chest: Effort normal and breath sounds normal. No respiratory distress. He has no  wheezes. He has no rales. He exhibits no tenderness.  Abdominal: Soft. He exhibits no distension and no mass. There is no tenderness. There is no rebound and no guarding.  Bowel sounds are hypoactive in all quadrants. Patient states he hasn't had anything to eat or drink since this morning secondary to abdominal discomfort and diarrhea.  Skin: Skin is warm and dry. He is not  diaphoretic.  Nursing note and vitals reviewed.   ED Course  Procedures (including critical care time)  Labs Review Labs Reviewed  STOOL CULTURE  OVA + PARASITE EXAM  POCT URINALYSIS DIP (DEVICE)   Stool specimens are pending.    Results for orders placed or performed during the hospital encounter of 12/19/14  POCT urinalysis dip (device)  Result Value Ref Range   Glucose, UA NEGATIVE NEGATIVE mg/dL   Bilirubin Urine NEGATIVE NEGATIVE   Ketones, ur NEGATIVE NEGATIVE mg/dL   Specific Gravity, Urine >=1.030 1.005 - 1.030   Hgb urine dipstick NEGATIVE NEGATIVE   pH 6.0 5.0 - 8.0   Protein, ur NEGATIVE NEGATIVE mg/dL   Urobilinogen, UA 0.2 0.0 - 1.0 mg/dL   Nitrite NEGATIVE NEGATIVE   Leukocytes, UA NEGATIVE NEGATIVE    Imaging Review No results found.   MDM   1. Bacterial gastroenteritis    Meds ordered this encounter  Medications  . ciprofloxacin (CIPRO) 500 MG tablet    Sig: Take 1 tablet (500 mg total) by mouth 2 (two) times daily.    Dispense:  28 tablet    Refill:  0    Order Specific Question:  Supervising Provider    Answer:  Linna Hoff [phone]  . Probiotic Product (ALIGN) 4 MG CAPS    Sig: Take 1 capsule by mouth daily.    Dispense:  30 capsule    Refill:  2    Order Specific Question:  Supervising Provider    Answer:  Linna Hoff [phone]    The patient is to bring is stool specimens here for testing.  He is to follow up with Family Medicine on Monday.  The patient verbalizes understanding and agrees to plan of care.       Servando Salina, NP 12/19/14 2006

## 2014-12-19 NOTE — ED Notes (Signed)
C/O constant mid-abd cramps x 2 weeks with watery, loose stools.  Denies blood in stool.  States any time he eats anything "it goes right through me".  Denies fevers, n/v.  Denies travel.  Has tried Pepto, an unk OTC med, and an enema "to try to clean things out".  States he's "fine if I don't eat anything".  C/O losing weight.

## 2014-12-19 NOTE — Discharge Instructions (Signed)
Follow up with Family Medicine on Monday.  Collect the stool specimens as directed and return them here for testing.   Diarrhea Diarrhea is frequent loose and watery bowel movements. It can cause you to feel weak and dehydrated. Dehydration can cause you to become tired and thirsty, have a dry mouth, and have decreased urination that often is dark yellow. Diarrhea is a sign of another problem, most often an infection that will not last long. In most cases, diarrhea typically lasts 2-3 days. However, it can last longer if it is a sign of something more serious. It is important to treat your diarrhea as directed by your caregiver to lessen or prevent future episodes of diarrhea. CAUSES  Some common causes include:  Gastrointestinal infections caused by viruses, bacteria, or parasites.  Food poisoning or food allergies.  Certain medicines, such as antibiotics, chemotherapy, and laxatives.  Artificial sweeteners and fructose.  Digestive disorders. HOME CARE INSTRUCTIONS  Ensure adequate fluid intake (hydration): Have 1 cup (8 oz) of fluid for each diarrhea episode. Avoid fluids that contain simple sugars or sports drinks, fruit juices, whole milk products, and sodas. Your urine should be clear or pale yellow if you are drinking enough fluids. Hydrate with an oral rehydration solution that you can purchase at pharmacies, retail stores, and online. You can prepare an oral rehydration solution at home by mixing the following ingredients together:   - tsp table salt.   tsp baking soda.   tsp salt substitute containing potassium chloride.  1  tablespoons sugar.  1 L (34 oz) of water.  Certain foods and beverages may increase the speed at which food moves through the gastrointestinal (GI) tract. These foods and beverages should be avoided and include:  Caffeinated and alcoholic beverages.  High-fiber foods, such as raw fruits and vegetables, nuts, seeds, and whole grain breads and  cereals.  Foods and beverages sweetened with sugar alcohols, such as xylitol, sorbitol, and mannitol.  Some foods may be well tolerated and may help thicken stool including:  Starchy foods, such as rice, toast, pasta, low-sugar cereal, oatmeal, grits, baked potatoes, crackers, and bagels.  Bananas.  Applesauce.  Add probiotic-rich foods to help increase healthy bacteria in the GI tract, such as yogurt and fermented milk products.  Wash your hands well after each diarrhea episode.  Only take over-the-counter or prescription medicines as directed by your caregiver.  Take a warm bath to relieve any burning or pain from frequent diarrhea episodes. SEEK IMMEDIATE MEDICAL CARE IF:   You are unable to keep fluids down.  You have persistent vomiting.  You have blood in your stool, or your stools are black and tarry.  You do not urinate in 6-8 hours, or there is only a small amount of very dark urine.  You have abdominal pain that increases or localizes.  You have weakness, dizziness, confusion, or light-headedness.  You have a severe headache.  Your diarrhea gets worse or does not get better.  You have a fever or persistent symptoms for more than 2-3 days.  You have a fever and your symptoms suddenly get worse. MAKE SURE YOU:   Understand these instructions.  Will watch your condition.  Will get help right away if you are not doing well or get worse. Document Released: 03/03/2002 Document Revised: 07/28/2013 Document Reviewed: 11/19/2011 [facility] Patient Information 2015 [place], [place]. This information is not intended to replace advice given to you by your health care provider. Make sure you discuss any questions you have  with your health care provider. Food Choices to Help Relieve Diarrhea When you have diarrhea, the foods you eat and your eating habits are very important. Choosing the right foods and drinks can help relieve diarrhea. Also, because diarrhea can last up to  7 days, you need to replace lost fluids and electrolytes (such as sodium, potassium, and chloride) in order to help prevent dehydration.  WHAT GENERAL GUIDELINES DO I NEED TO FOLLOW?  Slowly drink 1 cup (8 oz) of fluid for each episode of diarrhea. If you are getting enough fluid, your urine will be clear or pale yellow.  Eat starchy foods. Some good choices include white rice, white toast, pasta, low-fiber cereal, baked potatoes (without the skin), saltine crackers, and bagels.  Avoid large servings of any cooked vegetables.  Limit fruit to two servings per day. A serving is  cup or 1 small piece.  Choose foods with less than 2 g of fiber per serving.  Limit fats to less than 8 tsp (38 g) per day.  Avoid fried foods.  Eat foods that have probiotics in them. Probiotics can be found in certain dairy products.  Avoid foods and beverages that may increase the speed at which food moves through the stomach and intestines (gastrointestinal tract). Things to avoid include:  High-fiber foods, such as dried fruit, raw fruits and vegetables, nuts, seeds, and whole grain foods.  Spicy foods and high-fat foods.  Foods and beverages sweetened with high-fructose corn syrup, honey, or sugar alcohols such as xylitol, sorbitol, and mannitol. WHAT FOODS ARE RECOMMENDED? Grains White rice. White, [place], or pita breads (fresh or toasted), including plain rolls, buns, or bagels. White pasta. Saltine, soda, or graham crackers. Pretzels. Low-fiber cereal. Cooked cereals made with water (such as cornmeal, farina, or cream cereals). Plain muffins. Matzo. Melba toast. Zwieback.  Vegetables Potatoes (without the skin). Strained tomato and vegetable juices. Most well-cooked and canned vegetables without seeds. Tender lettuce. Fruits Cooked or canned applesauce, apricots, cherries, fruit cocktail, grapefruit, peaches, pears, or plums. Fresh bananas, apples without skin, cherries, grapes, cantaloupe, grapefruit,  peaches, oranges, or plums.  Meat and Other Protein Products Baked or boiled chicken. Eggs. Tofu. Fish. Seafood. Smooth peanut butter. Ground or well-cooked tender beef, ham, veal, lamb, pork, or poultry.  Dairy Plain yogurt, kefir, and unsweetened liquid yogurt. Lactose-free milk, buttermilk, or soy milk. Plain hard cheese. Beverages Sport drinks. Clear broths. Diluted fruit juices (except prune). Regular, caffeine-free sodas such as ginger ale. Water. Decaffeinated teas. Oral rehydration solutions. Sugar-free beverages not sweetened with sugar alcohols. Other Bouillon, broth, or soups made from recommended foods.  The items listed above may not be a complete list of recommended foods or beverages. Contact your dietitian for more options. WHAT FOODS ARE NOT RECOMMENDED? Grains Whole grain, whole wheat, bran, or rye breads, rolls, pastas, crackers, and cereals. Wild or brown rice. Cereals that contain more than 2 g of fiber per serving. Corn tortillas or taco shells. Cooked or dry oatmeal. Granola. Popcorn. Vegetables Raw vegetables. Cabbage, broccoli, Brussels sprouts, artichokes, baked beans, beet greens, corn, kale, legumes, peas, sweet potatoes, and yams. Potato skins. Cooked spinach and cabbage. Fruits Dried fruit, including raisins and dates. Raw fruits. Stewed or dried prunes. Fresh apples with skin, apricots, mangoes, pears, raspberries, and strawberries.  Meat and Other Protein Products Chunky peanut butter. Nuts and seeds. Beans and lentils. Tomasa Blase.  Dairy High-fat cheeses. Milk, chocolate milk, and beverages made with milk, such as milk shakes. Cream. Ice cream. Sweets and Desserts Sweet  rolls, doughnuts, and sweet breads. Pancakes and waffles. Fats and Oils Butter. Cream sauces. Margarine. Salad oils. Plain salad dressings. Olives. Avocados.  Beverages Caffeinated beverages (such as coffee, tea, soda, or energy drinks). Alcoholic beverages. Fruit juices with pulp. Prune juice.  Soft drinks sweetened with high-fructose corn syrup or sugar alcohols. Other Coconut. Hot sauce. Chili powder. Mayonnaise. Gravy. Cream-based or milk-based soups.  The items listed above may not be a complete list of foods and beverages to avoid. Contact your dietitian for more information. WHAT SHOULD I DO IF I BECOME DEHYDRATED? Diarrhea can sometimes lead to dehydration. Signs of dehydration include dark urine and dry mouth and skin. If you think you are dehydrated, you should rehydrate with an oral rehydration solution. These solutions can be purchased at pharmacies, retail stores, or online.  Drink -1 cup (120-240 mL) of oral rehydration solution each time you have an episode of diarrhea. If drinking this amount makes your diarrhea worse, try drinking smaller amounts more often. For example, drink 1-3 tsp (5-15 mL) every 5-10 minutes.  A general rule for staying hydrated is to drink 1-2 L of fluid per day. Talk to your health care provider about the specific amount you should be drinking each day. Drink enough fluids to keep your urine clear or pale yellow. Document Released: 06/03/2003 Document Revised: 03/18/2013 Document Reviewed: 02/03/2013 [facility] Patient Information 2015 [place], [place]. This information is not intended to replace advice given to you by your health care provider. Make sure you discuss any questions you have with your health care provider.

## 2015-02-22 ENCOUNTER — Telehealth: Payer: Self-pay | Admitting: Family Medicine

## 2015-02-22 NOTE — Telephone Encounter (Signed)
[facility] [place]  [facility] has appointment 02/24/15  Diagnosis: M51.36, M54.5, and  M54.16

## 2015-02-23 NOTE — Telephone Encounter (Signed)
Referral done # is [id number]

## 2015-05-24 ENCOUNTER — Ambulatory Visit (INDEPENDENT_AMBULATORY_CARE_PROVIDER_SITE_OTHER): Payer: BLUE CROSS/BLUE SHIELD | Admitting: Medical

## 2015-05-24 ENCOUNTER — Telehealth: Payer: Self-pay | Admitting: Medical

## 2015-05-24 ENCOUNTER — Encounter: Payer: Self-pay | Admitting: Medical

## 2015-05-24 VITALS — BP 124/88 | HR 67 | Ht 72.75 in | Wt 200.0 lb

## 2015-05-24 DIAGNOSIS — R053 Chronic cough: Secondary | ICD-10-CM | POA: Insufficient documentation

## 2015-05-24 DIAGNOSIS — Z113 Encounter for screening for infections with a predominantly sexual mode of transmission: Secondary | ICD-10-CM | POA: Diagnosis not present

## 2015-05-24 DIAGNOSIS — F17200 Nicotine dependence, unspecified, uncomplicated: Secondary | ICD-10-CM

## 2015-05-24 DIAGNOSIS — R05 Cough: Secondary | ICD-10-CM | POA: Diagnosis not present

## 2015-05-24 DIAGNOSIS — R7989 Other specified abnormal findings of blood chemistry: Secondary | ICD-10-CM

## 2015-05-24 DIAGNOSIS — G8929 Other chronic pain: Secondary | ICD-10-CM

## 2015-05-24 DIAGNOSIS — M549 Dorsalgia, unspecified: Secondary | ICD-10-CM | POA: Diagnosis not present

## 2015-05-24 DIAGNOSIS — Z0000 Encounter for general adult medical examination without abnormal findings: Secondary | ICD-10-CM | POA: Insufficient documentation

## 2015-05-24 DIAGNOSIS — R059 Cough, unspecified: Secondary | ICD-10-CM

## 2015-05-24 DIAGNOSIS — Z125 Encounter for screening for malignant neoplasm of prostate: Secondary | ICD-10-CM | POA: Diagnosis not present

## 2015-05-24 DIAGNOSIS — N529 Male erectile dysfunction, unspecified: Secondary | ICD-10-CM

## 2015-05-24 DIAGNOSIS — R799 Abnormal finding of blood chemistry, unspecified: Secondary | ICD-10-CM | POA: Diagnosis not present

## 2015-05-24 DIAGNOSIS — Z2820 Immunization not carried out because of patient decision for unspecified reason: Secondary | ICD-10-CM

## 2015-05-24 DIAGNOSIS — Z809 Family history of malignant neoplasm, unspecified: Secondary | ICD-10-CM | POA: Diagnosis not present

## 2015-05-24 DIAGNOSIS — Z720 Tobacco use: Secondary | ICD-10-CM

## 2015-05-24 DIAGNOSIS — Z1211 Encounter for screening for malignant neoplasm of colon: Secondary | ICD-10-CM | POA: Insufficient documentation

## 2015-05-24 LAB — POCT URINALYSIS DIPSTICK
Bilirubin, UA: NEGATIVE
Glucose, UA: NEGATIVE
KETONES UA: NEGATIVE
Leukocytes, UA: NEGATIVE
Nitrite, UA: NEGATIVE
PH UA: 6
Protein, UA: NEGATIVE
RBC UA: NEGATIVE
Spec Grav, UA: >=1.03
Urobilinogen, UA: NEGATIVE

## 2015-05-24 LAB — COMPREHENSIVE METABOLIC PANEL
ALK PHOS: 33 U/L — AB (ref 40–115)
ALT: 37 U/L (ref 9–46)
AST: 43 U/L — ABNORMAL HIGH (ref 10–40)
Albumin: 4.7 g/dL (ref 3.6–5.1)
BILIRUBIN TOTAL: 1.4 mg/dL — AB (ref 0.2–1.2)
BUN: 16 mg/dL (ref 7–25)
CO2: 27 mmol/L (ref 20–31)
CREATININE: 1.4 mg/dL — AB (ref 0.60–1.35)
Calcium: 9.5 mg/dL (ref 8.6–10.3)
Chloride: 102 mmol/L (ref 98–110)
Glucose, Bld: 79 mg/dL (ref 65–99)
Potassium: 4 mmol/L (ref 3.5–5.3)
Sodium: 139 mmol/L (ref 135–146)
TOTAL PROTEIN: 6.9 g/dL (ref 6.1–8.1)

## 2015-05-24 LAB — CBC
HCT: 43.4 % (ref 39.0–52.0)
HEMOGLOBIN: 14.1 g/dL (ref 13.0–17.0)
MCH: 24.8 pg — ABNORMAL LOW (ref 26.0–34.0)
MCHC: 32.5 g/dL (ref 30.0–36.0)
MCV: 76.3 fL — ABNORMAL LOW (ref 78.0–100.0)
MPV: 9.7 fL (ref 8.6–12.4)
Platelets: 204 10*3/uL (ref 150–400)
RBC: 5.69 MIL/uL (ref 4.22–5.81)
RDW: 17.1 % — ABNORMAL HIGH (ref 11.5–15.5)
WBC: 4.8 10*3/uL (ref 4.0–10.5)

## 2015-05-24 LAB — LIPID PANEL
CHOLESTEROL: 202 mg/dL — AB (ref 125–200)
HDL: 86 mg/dL (ref >=40)
LDL Cholesterol: 95 mg/dL (ref <130)
Total CHOL/HDL Ratio: 2.3 Ratio (ref <=5.0)
Triglycerides: 106 mg/dL (ref <150)
VLDL: 21 mg/dL (ref <30)

## 2015-05-24 MED ORDER — SILDENAFIL CITRATE 100 MG PO TABS: ORAL_TABLET | ORAL | Status: DC

## 2015-05-24 NOTE — Telephone Encounter (Signed)
He can call his insurer about coverage.  typically insurance won't cover this unless he is 50yo or has colon cancer history in family.

## 2015-05-24 NOTE — Telephone Encounter (Signed)
Pt states he forgot to discuss with you that he wanted to be set up for colonoscopy.  Please call pt and advise

## 2015-05-24 NOTE — Progress Notes (Signed)
Subjective:   HPI  Benjamin [name] is a 50 y.o. male who presents for a complete physical.  Medical team: [facility] TYSINGER, DAVID Vincenza Hews, PA-C here for primary care  Concerns: Has some cough, smoker, wants screen for lung cancer and other cancers.  Mom died in 09-Mar-2023 from lung cancer.  Still has ongoing chronic back pain, seeing spine center  Never came in for trial of Viagra last visit.  Would like to try this.  Has problems getting and keeping erections.  Reviewed their medical, surgical, family, social, medication, and allergy history and updated chart as appropriate.  Past Medical History  Diagnosis Date  . Erectile dysfunction   . Chronic back pain     [facility]    Past Surgical History  Procedure Laterality Date  . Ankle surgery      right  . Hand surgery      fracture, ORIG, teenage years, right.  . Lumbar epidural injection      [facility]    Social History   Social History  . Marital Status: Single    Spouse Name: N/A  . Number of Children: N/A  . Years of Education: N/A   Occupational History  . Not on file.   Social History Main Topics  . Smoking status: Current Every Day Smoker -- 0.50 packs/day for 6 years    Types: Cigarettes  . Smokeless tobacco: Not on file  . Alcohol Use: Yes     Comment: occasional  . Drug Use: No  . Sexual Activity: Not on file   Other Topics Concern  . 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   No children    Family History  Problem Relation Age of Onset  . Cancer Mother     lung  . Cancer Father     throat  . Diabetes Paternal Grandmother   . Heart disease Neg Hx   . Stroke Neg Hx      Current outpatient prescriptions:  .  HYDROcodone-acetaminophen (NORCO) 10-325 MG per tablet, Take 1 tablet by mouth every 6 (six) hours as needed., Disp: , Rfl:  .  Probiotic  Product (ALIGN) 4 MG CAPS, Take 1 capsule by mouth daily. (Patient not taking: Reported on 05/24/2015), Disp: 30 capsule, Rfl: 2 .  sildenafil (VIAGRA) 100 MG tablet, TAKE 1/2 TO 1 TABLET BY MOUTH EVERY DAY AS NEEDED FOR ERECTILE DYSFUNCTION, Disp: 10 tablet, Rfl: 2 .  traMADol (ULTRAM) 50 MG tablet, Take 1 tablet (50 mg total) by mouth every 6 (six) hours as needed. (Patient not taking: Reported on 05/24/2015), Disp: 30 tablet, Rfl: 0  No Known Allergies   Review of Systems Constitutional: -fever, -chills, -sweats, -unexpected weight change, -decreased appetite, -fatigue Allergy: -sneezing, -itching, -congestion Dermatology: -changing moles, --rash, -lumps ENT: -runny nose, -ear pain, -sore throat, -hoarseness, -sinus pain, -teeth pain, - ringing in ears, -hearing loss, -nosebleeds Cardiology: -chest pain, -palpitations, -swelling, -difficulty breathing when lying flat, -waking up short of breath Respiratory: -cough, -shortness of breath, -difficulty breathing with exercise or exertion, -wheezing, -coughing up blood Gastroenterology: -abdominal pain, -nausea, -vomiting, -diarrhea, -constipation, -blood in stool, -changes in bowel movement, -difficulty swallowing or eating Hematology: -bleeding, -bruising  Musculoskeletal: -joint aches, -muscle aches, -joint swelling, +back pain, -neck pain, -cramping, -changes in gait Ophthalmology: denies vision changes, eye redness, itching, discharge Urology: -burning with urination, -difficulty urinating, -blood  in urine, -urinary frequency, -urgency, -incontinence Neurology: -headache, -weakness, -tingling, -numbness, -memory loss, -falls, -dizziness Psychology: -depressed mood, -agitation, -sleep problems     Objective:   Physical Exam  BP 124/88 mmHg  Pulse 67  Ht 6' 0.75" (1.848 m)  Wt 200 lb (90.719 kg)  BMI 26.56 kg/m2  General appearance: alert, no distress, WD/WN, AA male Skin: left cheek with small brown flat macule unchanged per patient,  few other benign macules, tattoo of "daddy" on upper abdomen, tattoo right arm HEENT: normocephalic, conjunctiva/corneas normal, sclerae anicteric, PERRLA, EOMi, nares patent, no discharge or erythema, pharynx normal Oral cavity: MMM, tongue normal, teeth in good repair Neck: supple, no lymphadenopathy, no thyromegaly, no masses, normal ROM, no bruits Chest: non tender, normal shape and expansion Heart: RRR, normal S1, S2, no murmurs Lungs: CTA bilaterally, no wheezes, rhonchi, or rales Abdomen: +bs, soft, non tender, non distended, no masses, no hepatomegaly, no splenomegaly, no bruits Back:   pain with ROM, lumbar spinal tenderness, otherwise non tender, normal ROM, no scoliosis Musculoskeletal: Right volar wrist surgical scar, right ankle surgical scar, otherwise upper extremities non tender, no obvious deformity, normal ROM throughout, lower extremities non tender, no obvious deformity, normal ROM throughout Extremities: no edema, no cyanosis, no clubbing Pulses: 2+ symmetric, upper and lower extremities, normal cap refill Neurological: alert, oriented x 3, CN2-12 intact, strength normal upper extremities and lower extremities, sensation normal throughout, DTRs 2+ throughout, no cerebellar signs, gait normal Psychiatric: normal affect, behavior normal, pleasant  GU: normal male external genitalia, circumcised, nontender, no masses, no hernia, no lymphadenopathy Rectal: anus normal tone, prostate WNL, no nodules, occult negative stool   Assessment and Plan :    Encounter Diagnoses  Name Primary?  . Routine general medical examination at a health care facility Yes  . Vaccine refused by patient   . Chronic back pain   . Abnormal serum creatinine level   . Family history of cancer   . Screening for prostate cancer   . Erectile dysfunction, unspecified erectile dysfunction type   . Smoker   . Cough   . Screen for STD (sexually transmitted disease)    Physical exam - discussed healthy  lifestyle, diet, exercise, preventative care, vaccinations, and addressed their concerns.   He refuses vaccines.  recommended Tdap, flu, pneumococcal Chronic back pain - c/t management [facility] abnormal serum findings - recheck labs today family hx/o cancer - discussed USPSTF screenings.  Discussed testicular cancer screen, discussed other guidelines.    Erectile Dysfunction - Reviewed pathophysiology and differential diagnosis of erectile dysfunction with the patient.  Discussed treatment options.  Begin trial of Viagra.  Discussed potential risks of medications including hypotension and priapism.  Discussed proper use of medication.  Questions were answered.  Recheck with call back. Smoker - he is trying to quit.  Go for CXR at his request given cough, smoking history Screen for STD today. Follow-up pending labs

## 2015-05-24 NOTE — Telephone Encounter (Signed)
Pt is aware.  

## 2015-05-25 LAB — RPR

## 2015-05-25 LAB — MICROALBUMIN / CREATININE URINE RATIO
CREATININE, URINE: 268 mg/dL (ref 20–370)
MICROALB/CREAT RATIO: 2 ug/mg{creat} (ref <30)
Microalb, Ur: 0.5 mg/dL

## 2015-05-25 LAB — HEMOGLOBIN A1C
HEMOGLOBIN A1C: 5.8 % — AB (ref <5.7)
MEAN PLASMA GLUCOSE: 120 mg/dL — AB (ref <117)

## 2015-05-25 LAB — HIV ANTIBODY (ROUTINE TESTING W REFLEX): HIV 1&2 Ab, 4th Generation: NONREACTIVE

## 2015-05-25 LAB — TSH: TSH: 0.62 m[IU]/L (ref 0.40–4.50)

## 2015-05-25 LAB — PSA: PSA: 0.59 ng/mL (ref <=4.00)

## 2015-05-26 ENCOUNTER — Telehealth: Payer: Self-pay

## 2015-05-26 DIAGNOSIS — R899 Unspecified abnormal finding in specimens from other organs, systems and tissues: Secondary | ICD-10-CM

## 2015-05-26 LAB — GC/CHLAMYDIA PROBE AMP
CT PROBE, AMP APTIMA: NOT DETECTED
GC PROBE AMP APTIMA: NOT DETECTED

## 2015-05-26 NOTE — Telephone Encounter (Signed)
-----   Message from Jac Canavan, PA-C sent at 05/26/2015  7:47 AM EST ----- Negative for HIV, syphilis, gonorrhea, and chlamydia.   Not sure why he doesn't want to evaluate the kidneys, but I recommend we either start with ultrasound or refer to nephrology to get an opinion on why he has abnormal kidney function.

## 2015-05-26 NOTE — Telephone Encounter (Signed)
Referral for nephrology.

## 2015-07-24 ENCOUNTER — Encounter (HOSPITAL_COMMUNITY): Payer: Self-pay | Admitting: Emergency Medicine

## 2015-07-24 ENCOUNTER — Encounter (HOSPITAL_COMMUNITY)
Admission: EM | Disposition: A | Discharge status: Home / Self Care | Payer: Self-pay | Source: Home / Self Care | Attending: Emergency Medicine

## 2015-07-24 ENCOUNTER — Emergency Department (HOSPITAL_COMMUNITY): Payer: BLUE CROSS/BLUE SHIELD | Admitting: Anesthesiology

## 2015-07-24 ENCOUNTER — Ambulatory Visit (HOSPITAL_COMMUNITY)
Admission: EM | Admit: 2015-07-24 | Discharge: 2015-07-24 | Disposition: A | Discharge status: Home / Self Care | Payer: BLUE CROSS/BLUE SHIELD | Attending: Emergency Medicine | Admitting: Emergency Medicine

## 2015-07-24 DIAGNOSIS — S56222A Laceration of other flexor muscle, fascia and tendon at forearm level, left arm, initial encounter: Secondary | ICD-10-CM | POA: Insufficient documentation

## 2015-07-24 DIAGNOSIS — N529 Male erectile dysfunction, unspecified: Secondary | ICD-10-CM | POA: Insufficient documentation

## 2015-07-24 DIAGNOSIS — S61512A Laceration without foreign body of left wrist, initial encounter: Secondary | ICD-10-CM | POA: Diagnosis present

## 2015-07-24 DIAGNOSIS — S66822A Laceration of other specified muscles, fascia and tendons at wrist and hand level, left hand, initial encounter: Secondary | ICD-10-CM | POA: Insufficient documentation

## 2015-07-24 DIAGNOSIS — F17210 Nicotine dependence, cigarettes, uncomplicated: Secondary | ICD-10-CM | POA: Diagnosis not present

## 2015-07-24 DIAGNOSIS — Z23 Encounter for immunization: Secondary | ICD-10-CM | POA: Diagnosis not present

## 2015-07-24 DIAGNOSIS — S6412XA Injury of median nerve at wrist and hand level of left arm, initial encounter: Secondary | ICD-10-CM | POA: Insufficient documentation

## 2015-07-24 DIAGNOSIS — W268XXA Contact with other sharp object(s), not elsewhere classified, initial encounter: Secondary | ICD-10-CM | POA: Insufficient documentation

## 2015-07-24 HISTORY — PX: TENDON REPAIR: SHX5111

## 2015-07-24 SURGERY — TENDON REPAIR
Anesthesia: Regional | Site: Arm Lower

## 2015-07-24 MED ORDER — LACTATED RINGERS IV SOLN
INTRAVENOUS | Status: DC | PRN
  Administered 2015-07-24 (×2): via INTRAVENOUS

## 2015-07-24 MED ORDER — ROCURONIUM BROMIDE 50 MG/5ML IV SOLN
INTRAVENOUS | Status: AC
  Filled 2015-07-24: qty 1

## 2015-07-24 MED ORDER — MIDAZOLAM HCL 2 MG/2ML IJ SOLN
INTRAMUSCULAR | Status: AC
  Filled 2015-07-24: qty 2

## 2015-07-24 MED ORDER — DOCUSATE SODIUM 100 MG PO CAPS: 100.0000 mg | ORAL_CAPSULE | Freq: Two times a day (BID) | ORAL | Status: DC

## 2015-07-24 MED ORDER — HYDROCODONE-ACETAMINOPHEN 5-325 MG PO TABS
2.0000 | ORAL_TABLET | Freq: Once | ORAL | Status: AC
  Administered 2015-07-24: 2 via ORAL
  Filled 2015-07-24: qty 2

## 2015-07-24 MED ORDER — CEFAZOLIN SODIUM 1 G IJ SOLR
INTRAMUSCULAR | Status: AC
  Filled 2015-07-24: qty 20

## 2015-07-24 MED ORDER — OXYCODONE-ACETAMINOPHEN 10-325 MG PO TABS: 1.0000 | ORAL_TABLET | ORAL | Status: DC | PRN

## 2015-07-24 MED ORDER — ONDANSETRON HCL 4 MG/2ML IJ SOLN
INTRAMUSCULAR | Status: DC | PRN
  Administered 2015-07-24: 4 mg via INTRAVENOUS

## 2015-07-24 MED ORDER — OXYCODONE-ACETAMINOPHEN 5-325 MG PO TABS
ORAL_TABLET | ORAL | Status: AC
  Filled 2015-07-24: qty 1

## 2015-07-24 MED ORDER — ONDANSETRON HCL 4 MG/2ML IJ SOLN: 4.0000 mg | Freq: Once | INTRAMUSCULAR | Status: DC | PRN

## 2015-07-24 MED ORDER — 0.9 % SODIUM CHLORIDE (POUR BTL) OPTIME
TOPICAL | Status: DC | PRN
  Administered 2015-07-24: 1000 mL

## 2015-07-24 MED ORDER — HYDROMORPHONE HCL 1 MG/ML IJ SOLN
0.2500 mg | INTRAMUSCULAR | Status: DC | PRN
  Administered 2015-07-24 (×2): 0.5 mg via INTRAVENOUS

## 2015-07-24 MED ORDER — LIDOCAINE HCL (CARDIAC) 20 MG/ML IV SOLN
INTRAVENOUS | Status: DC | PRN
  Administered 2015-07-24: 100 mg via INTRATRACHEAL

## 2015-07-24 MED ORDER — HYDROMORPHONE HCL 1 MG/ML IJ SOLN
INTRAMUSCULAR | Status: AC
  Filled 2015-07-24: qty 1

## 2015-07-24 MED ORDER — BUPIVACAINE HCL (PF) 0.5 % IJ SOLN
10.0000 mL | Freq: Once | INTRAMUSCULAR | Status: AC
  Administered 2015-07-24: 10 mL
  Filled 2015-07-24: qty 10

## 2015-07-24 MED ORDER — MEPERIDINE HCL 25 MG/ML IJ SOLN: 6.2500 mg | INTRAMUSCULAR | Status: DC | PRN

## 2015-07-24 MED ORDER — MIDAZOLAM HCL 5 MG/5ML IJ SOLN
INTRAMUSCULAR | Status: DC | PRN
  Administered 2015-07-24 (×2): 1 mg via INTRAVENOUS

## 2015-07-24 MED ORDER — TETANUS-DIPHTH-ACELL PERTUSSIS 5-2.5-18.5 LF-MCG/0.5 IM SUSP
0.5000 mL | Freq: Once | INTRAMUSCULAR | Status: AC
  Administered 2015-07-24: 0.5 mL via INTRAMUSCULAR
  Filled 2015-07-24: qty 0.5

## 2015-07-24 MED ORDER — FENTANYL CITRATE (PF) 250 MCG/5ML IJ SOLN
INTRAMUSCULAR | Status: AC
  Filled 2015-07-24: qty 5

## 2015-07-24 MED ORDER — BUPIVACAINE HCL (PF) 0.25 % IJ SOLN
INTRAMUSCULAR | Status: AC
  Filled 2015-07-24: qty 30

## 2015-07-24 MED ORDER — PROPOFOL 10 MG/ML IV BOLUS
INTRAVENOUS | Status: AC
  Filled 2015-07-24: qty 20

## 2015-07-24 MED ORDER — BUPIVACAINE HCL 0.25 % IJ SOLN: 10.0000 mL | Freq: Once | INTRAMUSCULAR | Status: DC

## 2015-07-24 MED ORDER — FENTANYL CITRATE (PF) 100 MCG/2ML IJ SOLN
INTRAMUSCULAR | Status: DC | PRN
  Administered 2015-07-24 (×2): 100 ug via INTRAVENOUS
  Administered 2015-07-24: 50 ug via INTRAVENOUS

## 2015-07-24 MED ORDER — LABETALOL HCL 5 MG/ML IV SOLN
INTRAVENOUS | Status: AC
  Filled 2015-07-24: qty 4

## 2015-07-24 MED ORDER — PROPOFOL 10 MG/ML IV BOLUS
INTRAVENOUS | Status: DC | PRN
  Administered 2015-07-24: 150 mg via INTRAVENOUS

## 2015-07-24 MED ORDER — LABETALOL HCL 5 MG/ML IV SOLN
5.0000 mg | Freq: Once | INTRAVENOUS | Status: AC
  Administered 2015-07-24: 5 mg via INTRAVENOUS

## 2015-07-24 MED ORDER — CEFAZOLIN SODIUM-DEXTROSE 2-4 GM/100ML-% IV SOLN
INTRAVENOUS | Status: AC
  Administered 2015-07-24: 2 g via INTRAVENOUS
  Filled 2015-07-24: qty 100

## 2015-07-24 MED ORDER — OXYCODONE-ACETAMINOPHEN 5-325 MG PO TABS
1.0000 | ORAL_TABLET | Freq: Once | ORAL | Status: AC
  Administered 2015-07-24: 1 via ORAL

## 2015-07-24 MED ORDER — ONDANSETRON HCL 4 MG/2ML IJ SOLN
INTRAMUSCULAR | Status: AC
  Filled 2015-07-24: qty 2

## 2015-07-24 SURGICAL SUPPLY — 51 items
BANDAGE ACE 4X5 VEL STRL LF (GAUZE/BANDAGES/DRESSINGS) ×2 IMPLANT
BANDAGE ELASTIC 3 VELCRO ST LF (GAUZE/BANDAGES/DRESSINGS) IMPLANT
BANDAGE ELASTIC 4 VELCRO ST LF (GAUZE/BANDAGES/DRESSINGS) IMPLANT
BNDG COHESIVE 1X5 TAN STRL LF (GAUZE/BANDAGES/DRESSINGS) IMPLANT
BNDG ESMARK 4X9 LF (GAUZE/BANDAGES/DRESSINGS) ×2 IMPLANT
BNDG GAUZE ELAST 4 BULKY (GAUZE/BANDAGES/DRESSINGS) ×2 IMPLANT
CORDS BIPOLAR (ELECTRODE) ×2 IMPLANT
COVER SURGICAL LIGHT HANDLE (MISCELLANEOUS) ×2 IMPLANT
CUFF TOURNIQUET SINGLE 18IN (TOURNIQUET CUFF) ×2 IMPLANT
CUFF TOURNIQUET SINGLE 24IN (TOURNIQUET CUFF) IMPLANT
DRAPE SURG 17X23 STRL (DRAPES) ×2 IMPLANT
DRSG ADAPTIC 3X8 NADH LF (GAUZE/BANDAGES/DRESSINGS) ×2 IMPLANT
GAUZE SPONGE 2X2 8PLY STRL LF (GAUZE/BANDAGES/DRESSINGS) IMPLANT
GAUZE SPONGE 4X4 12PLY STRL (GAUZE/BANDAGES/DRESSINGS) ×2 IMPLANT
GLOVE BIOGEL PI IND STRL 8.5 (GLOVE) ×1 IMPLANT
GLOVE BIOGEL PI INDICATOR 8.5 (GLOVE) ×1
GLOVE SURG ORTHO 8.0 STRL STRW (GLOVE) ×2 IMPLANT
GOWN STRL REUS W/ TWL LRG LVL3 (GOWN DISPOSABLE) ×2 IMPLANT
GOWN STRL REUS W/ TWL XL LVL3 (GOWN DISPOSABLE) ×1 IMPLANT
GOWN STRL REUS W/TWL LRG LVL3 (GOWN DISPOSABLE) ×2
GOWN STRL REUS W/TWL XL LVL3 (GOWN DISPOSABLE) ×1
KIT BASIN OR (CUSTOM PROCEDURE TRAY) ×2 IMPLANT
KIT ROOM TURNOVER OR (KITS) ×2 IMPLANT
LOOP VESSEL MAXI BLUE (MISCELLANEOUS) ×2 IMPLANT
MANIFOLD NEPTUNE II (INSTRUMENTS) ×2 IMPLANT
NEEDLE HYPO 25GX1X1/2 BEV (NEEDLE) IMPLANT
NS IRRIG 1000ML POUR BTL (IV SOLUTION) ×2 IMPLANT
PACK ORTHO EXTREMITY (CUSTOM PROCEDURE TRAY) ×2 IMPLANT
PAD ARMBOARD 7.5X6 YLW CONV (MISCELLANEOUS) ×4 IMPLANT
PAD CAST 4YDX4 CTTN HI CHSV (CAST SUPPLIES) IMPLANT
PADDING CAST COTTON 4X4 STRL (CAST SUPPLIES)
SOAP 2 % CHG 4 OZ (WOUND CARE) ×2 IMPLANT
SPECIMEN JAR SMALL (MISCELLANEOUS) ×2 IMPLANT
SPLINT FIBERGLASS 3X35 (CAST SUPPLIES) ×2 IMPLANT
SPONGE GAUZE 2X2 STER 10/PKG (GAUZE/BANDAGES/DRESSINGS)
SUCTION FRAZIER HANDLE 10FR (MISCELLANEOUS) ×1
SUCTION TUBE FRAZIER 10FR DISP (MISCELLANEOUS) ×1 IMPLANT
SUT FIBERWIRE 4-0 18 TAPR NDL (SUTURE) ×4
SUT MERSILENE 4 0 P 3 (SUTURE) IMPLANT
SUT PROLENE 4 0 P 3 18 (SUTURE) ×2 IMPLANT
SUT PROLENE 4 0 PS 2 18 (SUTURE) IMPLANT
SUT PROLENE 6 0 CC (SUTURE) ×2 IMPLANT
SUT VIC AB 2-0 CT1 27 (SUTURE)
SUT VIC AB 2-0 CT1 TAPERPNT 27 (SUTURE) IMPLANT
SUTURE FIBERWR 4-0 18 TAPR NDL (SUTURE) ×2 IMPLANT
SYR CONTROL 10ML LL (SYRINGE) IMPLANT
TOWEL OR 17X24 6PK STRL BLUE (TOWEL DISPOSABLE) ×2 IMPLANT
TOWEL OR 17X26 10 PK STRL BLUE (TOWEL DISPOSABLE) ×2 IMPLANT
TUBE CONNECTING 12X1/4 (SUCTIONS) IMPLANT
UNDERPAD 30X30 INCONTINENT (UNDERPADS AND DIAPERS) ×2 IMPLANT
WATER STERILE IRR 1000ML POUR (IV SOLUTION) ×2 IMPLANT

## 2015-07-24 NOTE — Anesthesia Postprocedure Evaluation (Signed)
Anesthesia Post Note  Patient: Benjamin HaagensenJohn [name]  Procedure(s) Performed: Procedure(s) (LRB): WRIST LACERATION AND TENDON REPAIR (N/A)  Patient location during evaluation: PACU Anesthesia Type: General Level of consciousness: awake and alert Pain management: pain level controlled Vital Signs Assessment: post-procedure vital signs reviewed and stable Respiratory status: spontaneous breathing, nonlabored ventilation, respiratory function stable and patient connected to nasal cannula oxygen Cardiovascular status: blood pressure returned to baseline and stable Postop Assessment: no signs of nausea or vomiting Anesthetic complications: no    Last Vitals:  Filed Vitals:   07/24/15 1630 07/24/15 1636  BP: 161/104 143/97  Pulse: 62 55  Temp:    Resp:      Last Pain:  Filed Vitals:   07/24/15 1709  PainSc: 10-Worst pain ever                 Essie Lagunes DAVID

## 2015-07-24 NOTE — Discharge Instructions (Signed)
KEEP BANDAGE CLEAN AND DRY °CALL OFFICE FOR F/U APPT [phone] IN 10 DAYS °KEEP HAND ELEVATED ABOVE HEART °OK TO APPLY ICE TO OPERATIVE AREA °CONTACT OFFICE IF ANY WORSENING PAIN OR CONCERNS. °

## 2015-07-24 NOTE — ED Notes (Signed)
Report given to Du BoisRobbie, RN short stay.  Gaynelle AduRobbie will insert IV in short stay.

## 2015-07-24 NOTE — ED Provider Notes (Signed)
CSN: [id number]     Arrival date & time 07/24/15  2725 History   First MD Initiated Contact with Patient 07/24/15 1013     Chief Complaint  Patient presents with  . Extremity Laceration    left wrist     (Consider location/radiation/quality/duration/timing/severity/associated sxs/prior Treatment) HPI Comments: Patient is a 50 year old male who presents with laceration to his left wrist. Patient was working in a basement full of scrap metal around 6:15 AM today when he lifted a piece of aluminum which caught his arm in drug it down. The patient states he was able to get the bleeding controlled in about one hour. He stated he felt weak at first, but now he is just feeling pain to the area. The patient denies any chest pain, shortness of breath, abdominal pain, nausea, vomiting, dysuria. Patient's last by mouth was 10 hours ago. The patient is tetanus is not up-to-date.  The history is provided by the patient.    Past Medical History  Diagnosis Date  . Erectile dysfunction   . Chronic back pain     [facility]   Past Surgical History  Procedure Laterality Date  . Ankle surgery      right  . Hand surgery      fracture, ORIG, teenage years, right.  . Lumbar epidural injection      [facility]   Family History  Problem Relation Age of Onset  . Cancer Mother     lung  . Cancer Father     throat  . Diabetes Paternal Grandmother   . Heart disease Neg Hx   . Stroke Neg Hx    Social History  Substance Use Topics  . Smoking status: Current Every Day Smoker -- 0.50 packs/day for 6 years    Types: Cigarettes  . Smokeless tobacco: None  . Alcohol Use: Yes     Comment: occasional    Review of Systems  Constitutional: Negative for fever and chills.  HENT: Negative for facial swelling and sore throat.   Respiratory: Negative for shortness of breath.   Cardiovascular: Negative for chest pain.  Gastrointestinal: Negative for nausea, vomiting  and abdominal pain.  Genitourinary: Negative for dysuria.  Musculoskeletal: Negative for back pain.  Skin: Positive for wound. Negative for rash.  Neurological: Negative for headaches.  Psychiatric/Behavioral: The patient is not nervous/anxious.       Allergies  Review of patient's allergies indicates no known allergies.  Home Medications   Prior to Admission medications   Medication Sig Start Date End Date Taking? Authorizing Provider  docusate sodium (COLACE) 100 MG capsule Take 1 capsule (100 mg total) by mouth 2 (two) times daily. 07/24/15   Bradly Bienenstock, MD  HYDROcodone-acetaminophen (NORCO) 10-325 MG per tablet Take 1 tablet by mouth every 6 (six) hours as needed.    Historical Provider, MD  oxyCODONE-acetaminophen (PERCOCET) 10-325 MG tablet Take 1 tablet by mouth every 4 (four) hours as needed for pain. 07/24/15   Bradly Bienenstock, MD  Probiotic Product (ALIGN) 4 MG CAPS Take 1 capsule by mouth daily. Patient not taking: Reported on 05/24/2015 12/19/14   Servando Salina, NP  sildenafil (VIAGRA) 100 MG tablet TAKE 1/2 TO 1 TABLET BY MOUTH EVERY DAY AS NEEDED FOR ERECTILE DYSFUNCTION 05/24/15   Kermit Balo Tysinger, PA-C  traMADol (ULTRAM) 50 MG tablet Take 1 tablet (50 mg total) by mouth every 6 (six) hours as needed. Patient not taking: Reported on 05/24/2015 08/12/14  Kermit Baloavid S Tysinger, PA-C   BP 146/108 mmHg  Pulse 62  Temp(Src) 98.8 F (37.1 C) (Oral)  Resp 18  SpO2 99% Physical Exam  Constitutional: He appears well-developed and well-nourished. No distress.  HENT:  Head: Normocephalic and atraumatic.  Eyes: Conjunctivae are normal. Pupils are equal, round, and reactive to light. Right eye exhibits no discharge. Left eye exhibits no discharge. No scleral icterus.  Neck: Normal range of motion. Neck supple. No thyromegaly present.  Cardiovascular: Normal rate, regular rhythm, normal heart sounds and intact distal pulses.  Exam reveals no gallop and no friction rub.   No murmur  heard. Pulmonary/Chest: Effort normal and breath sounds normal. No stridor. No respiratory distress. He has no wheezes. He has no rales.  Abdominal: Soft. Bowel sounds are normal. He exhibits no distension. There is no tenderness. There is no rebound and no guarding.  Musculoskeletal: He exhibits no edema.       Hands: 5 cm laceration across the anterior wrist; most likely tendon laceration found during wound exploration and irrigation; normal sensation; cap Refill <2 sec; patient able to flex and extend all fingers at DIP and PIP; patient experiences sharp pain in his palm on extension of his fingers  Lymphadenopathy:    He has no cervical adenopathy.  Neurological: He is alert. Coordination normal.  Skin: Skin is warm and dry. No rash noted. He is not diaphoretic. No pallor.  Psychiatric: He has a normal mood and affect.  Nursing note and vitals reviewed.   ED Course  Procedures (including critical care time) Labs Review Labs Reviewed - No data to display  Imaging Review No results found. I have personally reviewed and evaluated these images and lab results as part of my medical decision-making.   EKG Interpretation None      Local anesthetic around wound with 0.25% Marcaine, 5mL, prior to irrigation and wound exploration. Wound dressed with saline gauze for transfer to OR.  MDM   On wound exploration and irrigation, it is noted that there is a most likely tendon laceration. No foreign bodies noted on examination. Hand surgery consulted and would like to take the patient to the OR today. Patient also evaluated by Dr. Linwood DibblesJon Knapp who is in agreement with plan. Patient care transferred to Dr. Orlan Leavensrtman for further evaluation and treatment of this laceration and OR.  Final diagnoses:  Laceration of wrist with complication, left, initial encounter     Emi Holeslexandra M Jaedon Siler, PA-C 07/24/15 1358  Linwood DibblesJon Knapp, MD 07/24/15 2038

## 2015-07-24 NOTE — ED Provider Notes (Signed)
Medical screening examination/treatment/procedure(s) were conducted as a shared visit with non-physician practitioner(s) and myself.  I personally evaluated the patient during the encounter.  Pt presents to the ED with an accidental wrist laceration while moving sheet metal.  Horizontal laceration along volar aspect of the left wrist. Wound explored and tendon laceration noted. Pt has pain with wrist extension that he feels in the palm of his hand but has intact movements of digits and wrist.  Distal sensation is intact.  Concerning for either flexor carpi radialis or palmaris longus laceration.  Will consult with orthopedic hand surgery.  Discussed with Dr Ortmann at [address].  He will plan on taking the patient to the OR.  Linwood DibblesJon Geanette Buonocore, MD 07/24/15 1255

## 2015-07-24 NOTE — OR Nursing (Signed)
Discharge instructions were reviewed with patient and patients brother. Pt and family did not have any questions regarding teaching. Pt stated that he had a "superman ring" that brother did not give back to him. ED and Short Stay was called to check and see if they had pts ring and both denied seeing ring. Pt family at bedside stated that they did not see the ring also and that it is probably at home. Pt BP was elevated and Anesthesia called and new order for Labetalol was administered and last BP 145/95. Anesthesia informed of last BP and agreed pt was safe to be discharged home.

## 2015-07-24 NOTE — Anesthesia Preprocedure Evaluation (Signed)
Anesthesia Evaluation  Patient identified by MRN, date of birth, ID band Patient awake    Reviewed: Allergy & Precautions, NPO status , Patient's Chart, lab work & pertinent test results  Airway Mallampati: I  TM Distance: >3 FB Neck ROM: Full    Dental   Pulmonary Current Smoker,    Pulmonary exam normal        Cardiovascular Normal cardiovascular exam     Neuro/Psych    GI/Hepatic   Endo/Other    Renal/GU      Musculoskeletal   Abdominal   Peds  Hematology   Anesthesia Other Findings   Reproductive/Obstetrics                             Anesthesia Physical Anesthesia Plan  ASA: II  Anesthesia Plan: Regional   Post-op Pain Management:    Induction: Intravenous  Airway Management Planned: Natural Airway  Additional Equipment:   Intra-op Plan:   Post-operative Plan:   Informed Consent: I have reviewed the patients History and Physical, chart, labs and discussed the procedure including the risks, benefits and alternatives for the proposed anesthesia with the patient or authorized representative who has indicated his/her understanding and acceptance.     Plan Discussed with: CRNA and Surgeon  Anesthesia Plan Comments:         Anesthesia Quick Evaluation

## 2015-07-24 NOTE — Anesthesia Procedure Notes (Signed)
Procedure Name: Intubation Date/Time: 07/24/2015 2:34 PM Performed by: Coralee RudFLORES, Latavia Goga Pre-anesthesia Checklist: Patient identified, Emergency Drugs available, Suction available, Patient being monitored and Timeout performed Patient Re-evaluated:Patient Re-evaluated prior to inductionOxygen Delivery Method: Circle System Utilized and Circle system utilized Preoxygenation: Pre-oxygenation with 100% oxygen Intubation Type: IV induction, Rapid sequence and Cricoid Pressure applied Laryngoscope Size: Mac and 3 Grade View: Grade II Tube type: Oral Tube size: 7.5 mm Number of attempts: 1 Airway Equipment and Method: Stylet Placement Confirmation: ETT inserted through vocal cords under direct vision,  positive ETCO2 and breath sounds checked- equal and bilateral Secured at: 22 cm Tube secured with: Tape Dental Injury: Teeth and Oropharynx as per pre-operative assessment

## 2015-07-24 NOTE — Progress Notes (Signed)
All belongings/ ring given to brother at bedside.

## 2015-07-24 NOTE — Brief Op Note (Signed)
07/24/2015  1:52 PM  PATIENT:  Benjamin HaagensenJohn Rudnick  50 y.o. male  PRE-OPERATIVE DIAGNOSIS:  laceration of left wrist  POST-OPERATIVE DIAGNOSIS:  * No post-op diagnosis entered *  PROCEDURE:  Procedure(s): WRIST LACERATION AND TENDON REPAIR (N/A)  SURGEON:  Surgeon(s) and Role:    * Bradly BienenstockFred Raliegh Scobie, MD - Primary  PHYSICIAN ASSISTANT:   ASSISTANTS: none   ANESTHESIA:   none  EBL:     BLOOD ADMINISTERED:none  DRAINS: none   LOCAL MEDICATIONS USED:  MARCAINE     SPECIMEN:  No Specimen  DISPOSITION OF SPECIMEN:  N/A  COUNTS:  YES  TOURNIQUET:    DICTATIO: [id number]: [id number]  PLAN OF CARE: Discharge to home after PACU  PATIENT DISPOSITION:  PACU - hemodynamically stable.   Delay start of Pharmacological VTE agent (>24hrs) due to surgical blood loss or risk of bleeding: not applicable

## 2015-07-24 NOTE — ED Notes (Signed)
Pt comes from home with a wrist laceration to left wrist. Pt was moving and a metal piece slit his left wrist. A large amount of blood loss per pt. Pt holding pressure to inner wrist. Pt became weak.

## 2015-07-24 NOTE — Transfer of Care (Signed)
Immediate Anesthesia Transfer of Care Note  Patient: Benjamin HaagensenJohn Guzek  Procedure(s) Performed: Procedure(s): WRIST LACERATION AND TENDON REPAIR (N/A)  Patient Location: PACU  Anesthesia Type:General  Level of Consciousness: awake  Airway & Oxygen Therapy: Patient Spontanous Breathing and Patient connected to nasal cannula oxygen  Post-op Assessment: Report given to RN and Post -op Vital signs reviewed and stable  Post vital signs: Reviewed and stable  Last Vitals:  Filed Vitals:   07/24/15 1100 07/24/15 1200  BP: 137/98 146/108  Pulse: 75 62  Temp:    Resp:  18    Last Pain:  Filed Vitals:   07/24/15 1316  PainSc: 8          Complications: No apparent anesthesia complications

## 2015-07-24 NOTE — H&P (Signed)
Benjamin HaagensenJohn [name] is an 50 y.o. male.   Chief Complaint: Left hand laceration  HPI: Pt at home sustained sharp injury to left wrist Pt here for surgery No prior surgery to left wrist  Past Medical History  Diagnosis Date  . Erectile dysfunction   . Chronic back pain     [facility]    Past Surgical History  Procedure Laterality Date  . Ankle surgery      right  . Hand surgery      fracture, ORIG, teenage years, right.  . Lumbar epidural injection      [facility]    Family History  Problem Relation Age of Onset  . Cancer Mother     lung  . Cancer Father     throat  . Diabetes Paternal Grandmother   . Heart disease Neg Hx   . Stroke Neg Hx    Social History:  reports that he has been smoking Cigarettes.  He has a 3 pack-year smoking history. He does not have any smokeless tobacco history on file. He reports that he drinks alcohol. He reports that he does not use illicit drugs.  Allergies: No Known Allergies  Medications Prior to Admission  Medication Sig Dispense Refill  . HYDROcodone-acetaminophen (NORCO) 10-325 MG per tablet Take 1 tablet by mouth every 6 (six) hours as needed.    . Probiotic Product (ALIGN) 4 MG CAPS Take 1 capsule by mouth daily. (Patient not taking: Reported on 05/24/2015) 30 capsule 2  . sildenafil (VIAGRA) 100 MG tablet TAKE 1/2 TO 1 TABLET BY MOUTH EVERY DAY AS NEEDED FOR ERECTILE DYSFUNCTION 10 tablet 2  . traMADol (ULTRAM) 50 MG tablet Take 1 tablet (50 mg total) by mouth every 6 (six) hours as needed. (Patient not taking: Reported on 05/24/2015) 30 tablet 0    No results found for this or any previous visit (from the past 48 hour(s)). No results found.  ROS NO RECENT ILLNESSES OR HOSPITALIZATIONS  Blood pressure 146/108, pulse 62, temperature 98.8 F (37.1 C), temperature source Oral, resp. rate 18, SpO2 99 %. Physical Exam  General Appearance:  Alert, cooperative, no distress, appears stated age   Head:  Normocephalic, without obvious abnormality, atraumatic  Eyes:  Pupils equal, conjunctiva/corneas clear,         Throat: Lips, mucosa, and tongue normal; teeth and gums normal  Neck: No visible masses     Lungs:   respirations unlabored  Chest Wall:  No tenderness or deformity  Heart:  Regular rate and rhythm,  Abdomen:   Soft, non-tender,         Extremities: LEFT WRIST: BANDAGE IN PLACE, FINGERS WARM WELL PERFUSED DIFFICULTY WITH THUMB PALMAR ABDUCTION DECREASED SENSATION TO LIGHT TOUCH, THUMB AND INDEX FDS/FDP INTACT TO INDEX/LONG/RING/SMALL ABLE TO FLEX THUMB IP JOINT  Pulses: 2+ and symmetric  Skin: Skin color, texture, turgor normal, no rashes or lesions     Neurologic: Normal   Assessment/Plan LEFT WRIST LACERATION WITH TENDON INJURY POSSIBLE NERVE INJURY  LEFT WRIST LACERATION WOUND EXPLORATION AND REPAIR AS INDICATED  R/B/A DISCUSSED WITH PT IN HOSPITAL.  PT VOICED UNDERSTANDING OF PLAN CONSENT SIGNED DAY OF SURGERY PT SEEN AND EXAMINED PRIOR TO OPERATIVE PROCEDURE/DAY OF SURGERY SITE MARKED. QUESTIONS ANSWERED WILL GO HOME FOLLOWING SURGERY  WE ARE PLANNING SURGERY FOR YOUR UPPER EXTREMITY. THE RISKS AND BENEFITS OF SURGERY INCLUDE BUT NOT LIMITED TO BLEEDING INFECTION, DAMAGE TO NEARBY NERVES ARTERIES TENDONS, FAILURE OF SURGERY TO ACCOMPLISH  ITS INTENDED GOALS, PERSISTENT SYMPTOMS AND NEED FOR FURTHER SURGICAL INTERVENTION. WITH THIS IN MIND WE WILL PROCEED. I HAVE DISCUSSED WITH THE PATIENT THE PRE AND POSTOPERATIVE REGIMEN AND THE DOS AND DON'TS. PT VOICED UNDERSTANDING AND INFORMED CONSENT SIGNED. Sharma Covert 07/24/2015, 1:49 PM

## 2015-07-25 NOTE — Op Note (Signed)
NAME:  Benjamin [name], Benjamin [name] NO.:  [id number]  MEDICAL RECORD NO.:  [id number]  LOCATION:  [facility]                         FACILITY:  [facility]  PHYSICIAN:  Sharma Covert IV, M.D.DATE OF BIRTH:  [birth year]  DATE OF PROCEDURE:  07/24/2015 DATE OF DISCHARGE:  07/24/2015                              OPERATIVE REPORT   PREOPERATIVE DIAGNOSIS:  Left wrist laceration with tendon involvement.  POSTOPERATIVE DIAGNOSIS:  Left wrist laceration with tendon involvement.  ATTENDING SURGEON:  Sharma Covert, M.D., who scrubbed and present the entire procedure.  ASSISTANT SURGEON:  None.  ANESTHESIA:  General via LMA.  PROCEDURES: 1. Left wrist laceration, flexor carpi radialis tendon repair. 2. Left wrist laceration, palmaris longus tendon repair. 3. Left wrist median nerve repair, primary neurorapphy 4. Left wrist traumatic laceration repair of 5 cm.  SURGICAL INDICATIONS:  Mr. Oregel is a right-hand-dominant gentleman, who sustained penetrating injury to his left wrist.  The patient was seen and evaluated and recommended to undergo the above procedure. Risks, benefits, and alternatives were discussed in detail with the patient.  Signed informed consent was obtained.  Risks include, but not limited to bleeding; infection; damage to nearby nerves, arteries, or tendons; loss of motion of wrist and digits; incomplete relief of symptoms; need for further surgical intervention.  DESCRIPTION OF PROCEDURE:  The patient was properly identified in the preoperative holding area, marked with a permanent marker made on left wrist to indicate the correct operative site.  The patient was brought back to the operating room, placed supine on the anesthesia table, general anesthesia was administered.  The patient tolerated this well. Well-padded tourniquet was placed on left brachium and sealed with 1000 drape.  Left upper extremity was then prepped and draped in normal sterile fashion.   Time-out was called, correct side was identified, and procedure then begun.  Attention then turned to left wrist.  5 cm laceration was then extended proximally and distally.  Limb was elevated and tourniquet insufflated.  Dissection was carried down through skin and subcutaneous tissues.  Dissection was carried down where the patient has near 50% laceration of the FCR __________ the palmaris longus as well as injury to the epineural and intraarticular lacerations to the median nerve, the volar portion of the median nerve has been lacerated, __________ 90% of the nerve was still in continuity.  The wound was then thoroughly irrigated.  Careful inspection of the ulnar nerve was done without any evidence of a laceration to the ulnar aspect.  No evidence of laceration to the flexor tendon, FPL and deep FDS and FDP.  Following this, the wound was then thoroughly irrigated. The median nerve was then repaired.  Epineural intra-fascicular sutures were then made with an 8-0 nylon as well as the epineural sutures repairing this nicely and closing this nicely.  Following this, the FCR was then repaired with running 4.0 FiberWire suture as well as several figure-of-eight 4.0 FiberWires.  Palmaris longus was repaired with FiberWire figure-of-eight and horizontal mattress sutures.  After repair of both flexor tendons, the nerve, the wound was irrigated, traumatic laceration was then closed with Prolene sutures.  10 mL of 0.25% Marcaine infiltrated.  Adaptic dressing and  sterile compressive bandage were applied.  The patient was placed in well-padded volar splint, extubated, and taken to recovery room in good condition.  POST-PROCEDURE PLAN:  The patient was discharged to home.  Will be seen back in the office in approximately 10 days for wound check, suture removal, and begin a postoperative wrist flexor repair protocol.     Madelynn DoneFred W. Raja Caputi IV, M.D.     FWO/MEDQ  D:  07/24/2015  T:   07/24/2015  Job:  [id number]

## 2015-07-26 ENCOUNTER — Encounter (HOSPITAL_COMMUNITY): Payer: Self-pay | Admitting: Orthopedic Surgery

## 2015-07-29 ENCOUNTER — Telehealth: Payer: Self-pay | Admitting: Medical

## 2015-07-29 NOTE — Telephone Encounter (Signed)
[facility] called and states that they have been calling Benjamin [name] since march 1st and trying to get in touch with him and the number they have and we have in not a working number and they asked for the emergency contact number and his mom was on the HIPPA form and i gave her the number, she was going to call her, i told her to tell them to get them to give us a call with a up dated phone number she just wanted to give you a heads up about this since they have been trying and not succeeded

## 2015-08-04 ENCOUNTER — Telehealth: Payer: Self-pay

## 2015-08-04 NOTE — Telephone Encounter (Signed)
Got a call from [facility] that pt has been called and messages left 5 times. I also tried to call the pt and the number is now disconnected. If the pt does still end up needing appt he will need to be referred again since he was referred on 05/26/15 and they are throwing the referral away if they haven't heard from him in two days

## 2015-08-10 ENCOUNTER — Inpatient Hospital Stay (HOSPITAL_COMMUNITY)
Admission: EM | Admit: 2015-08-10 | Discharge: 2015-08-13 | DRG: 577 | Disposition: A | Discharge status: Other Acute Inpatient Hospital | Payer: BLUE CROSS/BLUE SHIELD | Attending: Internal Medicine | Admitting: Internal Medicine

## 2015-08-10 ENCOUNTER — Inpatient Hospital Stay (HOSPITAL_COMMUNITY): Payer: BLUE CROSS/BLUE SHIELD | Admitting: Anesthesiology

## 2015-08-10 ENCOUNTER — Emergency Department (HOSPITAL_COMMUNITY): Payer: BLUE CROSS/BLUE SHIELD

## 2015-08-10 ENCOUNTER — Encounter (HOSPITAL_COMMUNITY)
Admission: EM | Disposition: A | Discharge status: Other Acute Inpatient Hospital | Payer: Self-pay | Source: Home / Self Care | Attending: Internal Medicine

## 2015-08-10 ENCOUNTER — Encounter (HOSPITAL_COMMUNITY): Payer: Self-pay | Admitting: Emergency Medicine

## 2015-08-10 DIAGNOSIS — E162 Hypoglycemia, unspecified: Secondary | ICD-10-CM | POA: Diagnosis not present

## 2015-08-10 DIAGNOSIS — G4700 Insomnia, unspecified: Secondary | ICD-10-CM | POA: Diagnosis not present

## 2015-08-10 DIAGNOSIS — X788XXA Intentional self-harm by other sharp object, initial encounter: Secondary | ICD-10-CM | POA: Diagnosis present

## 2015-08-10 DIAGNOSIS — F1410 Cocaine abuse, uncomplicated: Secondary | ICD-10-CM | POA: Diagnosis present

## 2015-08-10 DIAGNOSIS — M549 Dorsalgia, unspecified: Secondary | ICD-10-CM | POA: Diagnosis present

## 2015-08-10 DIAGNOSIS — N529 Male erectile dysfunction, unspecified: Secondary | ICD-10-CM | POA: Diagnosis present

## 2015-08-10 DIAGNOSIS — F32A Depression, unspecified: Secondary | ICD-10-CM | POA: Diagnosis present

## 2015-08-10 DIAGNOSIS — T1491XA Suicide attempt, initial encounter: Secondary | ICD-10-CM | POA: Diagnosis present

## 2015-08-10 DIAGNOSIS — D62 Acute posthemorrhagic anemia: Secondary | ICD-10-CM | POA: Diagnosis not present

## 2015-08-10 DIAGNOSIS — N183 Chronic kidney disease, stage 3 (moderate): Secondary | ICD-10-CM | POA: Diagnosis present

## 2015-08-10 DIAGNOSIS — T1491 Suicide attempt: Secondary | ICD-10-CM | POA: Diagnosis present

## 2015-08-10 DIAGNOSIS — F1010 Alcohol abuse, uncomplicated: Secondary | ICD-10-CM | POA: Diagnosis present

## 2015-08-10 DIAGNOSIS — F17210 Nicotine dependence, cigarettes, uncomplicated: Secondary | ICD-10-CM | POA: Diagnosis present

## 2015-08-10 DIAGNOSIS — F1910 Other psychoactive substance abuse, uncomplicated: Secondary | ICD-10-CM | POA: Diagnosis not present

## 2015-08-10 DIAGNOSIS — E860 Dehydration: Secondary | ICD-10-CM | POA: Diagnosis present

## 2015-08-10 DIAGNOSIS — G8929 Other chronic pain: Secondary | ICD-10-CM | POA: Diagnosis present

## 2015-08-10 DIAGNOSIS — F322 Major depressive disorder, single episode, severe without psychotic features: Secondary | ICD-10-CM | POA: Diagnosis not present

## 2015-08-10 DIAGNOSIS — F329 Major depressive disorder, single episode, unspecified: Secondary | ICD-10-CM | POA: Diagnosis present

## 2015-08-10 DIAGNOSIS — F1210 Cannabis abuse, uncomplicated: Secondary | ICD-10-CM | POA: Diagnosis not present

## 2015-08-10 DIAGNOSIS — S41111A Laceration without foreign body of right upper arm, initial encounter: Secondary | ICD-10-CM | POA: Diagnosis not present

## 2015-08-10 DIAGNOSIS — F1290 Cannabis use, unspecified, uncomplicated: Secondary | ICD-10-CM | POA: Diagnosis present

## 2015-08-10 DIAGNOSIS — S51811A Laceration without foreign body of right forearm, initial encounter: Principal | ICD-10-CM | POA: Diagnosis present

## 2015-08-10 DIAGNOSIS — F1110 Opioid abuse, uncomplicated: Secondary | ICD-10-CM | POA: Diagnosis not present

## 2015-08-10 DIAGNOSIS — I129 Hypertensive chronic kidney disease with stage 1 through stage 4 chronic kidney disease, or unspecified chronic kidney disease: Secondary | ICD-10-CM | POA: Diagnosis present

## 2015-08-10 DIAGNOSIS — F4321 Adjustment disorder with depressed mood: Secondary | ICD-10-CM | POA: Diagnosis not present

## 2015-08-10 DIAGNOSIS — R634 Abnormal weight loss: Secondary | ICD-10-CM | POA: Diagnosis present

## 2015-08-10 DIAGNOSIS — S41111D Laceration without foreign body of right upper arm, subsequent encounter: Secondary | ICD-10-CM | POA: Diagnosis not present

## 2015-08-10 DIAGNOSIS — I10 Essential (primary) hypertension: Secondary | ICD-10-CM | POA: Diagnosis present

## 2015-08-10 HISTORY — PX: I & D EXTREMITY: SHX5045

## 2015-08-10 LAB — RAPID URINE DRUG SCREEN, HOSP PERFORMED
Amphetamines: NOT DETECTED
BARBITURATES: NOT DETECTED
Benzodiazepines: POSITIVE — AB
Cocaine: POSITIVE — AB
Opiates: POSITIVE — AB
TETRAHYDROCANNABINOL: POSITIVE — AB

## 2015-08-10 LAB — CBC WITH DIFFERENTIAL/PLATELET
BASOS PCT: 0 %
Basophils Absolute: 0 10*3/uL (ref 0.0–0.1)
EOS ABS: 0 10*3/uL (ref 0.0–0.7)
EOS PCT: 1 %
HCT: 34.9 % — ABNORMAL LOW (ref 39.0–52.0)
HEMOGLOBIN: 11.8 g/dL — AB (ref 13.0–17.0)
Lymphocytes Relative: 22 %
Lymphs Abs: 1.2 10*3/uL (ref 0.7–4.0)
MCH: 25.8 pg — ABNORMAL LOW (ref 26.0–34.0)
MCHC: 33.8 g/dL (ref 30.0–36.0)
MCV: 76.4 fL — ABNORMAL LOW (ref 78.0–100.0)
MONOS PCT: 9 %
Monocytes Absolute: 0.5 10*3/uL (ref 0.1–1.0)
NEUTROS PCT: 68 %
Neutro Abs: 3.7 10*3/uL (ref 1.7–7.7)
PLATELETS: 182 10*3/uL (ref 150–400)
RBC: 4.57 MIL/uL (ref 4.22–5.81)
RDW: 14.3 % (ref 11.5–15.5)
WBC: 5.4 10*3/uL (ref 4.0–10.5)

## 2015-08-10 LAB — ETHANOL: Alcohol, Ethyl (B): <5 mg/dL (ref <5)

## 2015-08-10 LAB — BASIC METABOLIC PANEL
Anion gap: 7 (ref 5–15)
BUN: 21 mg/dL — AB (ref 6–20)
CALCIUM: 9.1 mg/dL (ref 8.9–10.3)
CHLORIDE: 104 mmol/L (ref 101–111)
CO2: 25 mmol/L (ref 22–32)
CREATININE: 1.64 mg/dL — AB (ref 0.61–1.24)
GFR, EST AFRICAN AMERICAN: 56 mL/min — AB (ref >60)
GFR, EST NON AFRICAN AMERICAN: 48 mL/min — AB (ref >60)
Glucose, Bld: 98 mg/dL (ref 65–99)
Potassium: 3.6 mmol/L (ref 3.5–5.1)
SODIUM: 136 mmol/L (ref 135–145)

## 2015-08-10 LAB — ACETAMINOPHEN LEVEL: Acetaminophen (Tylenol), Serum: <10 ug/mL — ABNORMAL LOW (ref 10–30)

## 2015-08-10 LAB — PHOSPHORUS: Phosphorus: 4.1 mg/dL (ref 2.5–4.6)

## 2015-08-10 LAB — MAGNESIUM: MAGNESIUM: 2.3 mg/dL (ref 1.7–2.4)

## 2015-08-10 LAB — SALICYLATE LEVEL

## 2015-08-10 SURGERY — IRRIGATION AND DEBRIDEMENT EXTREMITY
Anesthesia: General | Site: Arm Lower | Laterality: Right

## 2015-08-10 MED ORDER — ROCURONIUM BROMIDE 50 MG/5ML IV SOLN
INTRAVENOUS | Status: AC
  Filled 2015-08-10: qty 1

## 2015-08-10 MED ORDER — ENSURE ENLIVE PO LIQD
237.0000 mL | Freq: Two times a day (BID) | ORAL | Status: DC
  Administered 2015-08-11 – 2015-08-13 (×5): 237 mL via ORAL

## 2015-08-10 MED ORDER — SODIUM CHLORIDE 0.9 % IV SOLN
Freq: Once | INTRAVENOUS | Status: AC
  Administered 2015-08-10: 11:00:00 via INTRAVENOUS

## 2015-08-10 MED ORDER — LACTATED RINGERS IV SOLN
INTRAVENOUS | Status: DC | PRN
  Administered 2015-08-10: 22:00:00 via INTRAVENOUS

## 2015-08-10 MED ORDER — ACETAMINOPHEN 650 MG RE SUPP: 650.0000 mg | Freq: Four times a day (QID) | RECTAL | Status: DC | PRN

## 2015-08-10 MED ORDER — KETOROLAC TROMETHAMINE 30 MG/ML IJ SOLN
30.0000 mg | Freq: Once | INTRAMUSCULAR | Status: AC | PRN
  Administered 2015-08-10: 30 mg via INTRAVENOUS

## 2015-08-10 MED ORDER — THIAMINE HCL 100 MG/ML IJ SOLN
100.0000 mg | Freq: Every day | INTRAMUSCULAR | Status: DC
  Administered 2015-08-11: 100 mg via INTRAVENOUS
  Filled 2015-08-10: qty 2

## 2015-08-10 MED ORDER — SODIUM CHLORIDE 0.9 % IV SOLN: INTRAVENOUS | Status: DC | PRN

## 2015-08-10 MED ORDER — DOCUSATE SODIUM 100 MG PO CAPS
100.0000 mg | ORAL_CAPSULE | Freq: Two times a day (BID) | ORAL | Status: DC
  Administered 2015-08-11 – 2015-08-13 (×5): 100 mg via ORAL
  Filled 2015-08-10 (×5): qty 1

## 2015-08-10 MED ORDER — BISACODYL 10 MG RE SUPP: 10.0000 mg | Freq: Every day | RECTAL | Status: DC | PRN

## 2015-08-10 MED ORDER — POLYETHYLENE GLYCOL 3350 17 G PO PACK: 17.0000 g | PACK | Freq: Every day | ORAL | Status: DC | PRN

## 2015-08-10 MED ORDER — LIDOCAINE HCL (CARDIAC) 20 MG/ML IV SOLN
INTRAVENOUS | Status: DC | PRN
  Administered 2015-08-10: 50 mg via INTRAVENOUS

## 2015-08-10 MED ORDER — FOLIC ACID 5 MG/ML IJ SOLN
1.0000 mg | Freq: Every day | INTRAMUSCULAR | Status: DC
  Administered 2015-08-11: 1 mg via INTRAVENOUS
  Filled 2015-08-10: qty 0.2

## 2015-08-10 MED ORDER — LIDOCAINE HCL 4 % EX SOLN
Freq: Once | CUTANEOUS | Status: AC
  Administered 2015-08-10: 11:00:00 via TOPICAL
  Filled 2015-08-10: qty 50

## 2015-08-10 MED ORDER — MIDAZOLAM HCL 5 MG/5ML IJ SOLN
INTRAMUSCULAR | Status: DC | PRN
  Administered 2015-08-10: 2 mg via INTRAVENOUS

## 2015-08-10 MED ORDER — DEXTROSE 5 % IV SOLN
1.0000 g | Freq: Once | INTRAVENOUS | Status: AC
  Administered 2015-08-10: 1 g via INTRAVENOUS
  Filled 2015-08-10: qty 10

## 2015-08-10 MED ORDER — PIPERACILLIN-TAZOBACTAM 3.375 G IVPB
3.3750 g | Freq: Once | INTRAVENOUS | Status: AC
  Administered 2015-08-10: 3.375 g via INTRAVENOUS

## 2015-08-10 MED ORDER — LIDOCAINE 2% (20 MG/ML) 5 ML SYRINGE
INTRAMUSCULAR | Status: AC
  Filled 2015-08-10: qty 5

## 2015-08-10 MED ORDER — METHOCARBAMOL 500 MG PO TABS
500.0000 mg | ORAL_TABLET | Freq: Four times a day (QID) | ORAL | Status: DC | PRN
  Administered 2015-08-11 – 2015-08-13 (×4): 500 mg via ORAL
  Filled 2015-08-10 (×4): qty 1

## 2015-08-10 MED ORDER — ONDANSETRON HCL 4 MG/2ML IJ SOLN
4.0000 mg | Freq: Once | INTRAMUSCULAR | Status: AC
Start: 2015-08-10 — End: 2015-08-10
  Administered 2015-08-10: 4 mg via INTRAVENOUS
  Filled 2015-08-10: qty 2

## 2015-08-10 MED ORDER — MORPHINE SULFATE (PF) 2 MG/ML IV SOLN
1.0000 mg | INTRAVENOUS | Status: DC | PRN
  Administered 2015-08-10 – 2015-08-11 (×3): 4 mg via INTRAVENOUS
  Administered 2015-08-12 – 2015-08-13 (×5): 2 mg via INTRAVENOUS
  Filled 2015-08-10: qty 2
  Filled 2015-08-10 (×3): qty 1
  Filled 2015-08-10: qty 2
  Filled 2015-08-10 (×2): qty 1
  Filled 2015-08-10: qty 2

## 2015-08-10 MED ORDER — FENTANYL CITRATE (PF) 100 MCG/2ML IJ SOLN
25.0000 ug | INTRAMUSCULAR | Status: DC | PRN
  Administered 2015-08-10 (×2): 50 ug via INTRAVENOUS

## 2015-08-10 MED ORDER — HYDROMORPHONE HCL 1 MG/ML IJ SOLN
1.0000 mg | Freq: Once | INTRAMUSCULAR | Status: AC
  Administered 2015-08-10: 1 mg via INTRAVENOUS
  Filled 2015-08-10: qty 1

## 2015-08-10 MED ORDER — PROPOFOL 10 MG/ML IV BOLUS
INTRAVENOUS | Status: DC | PRN
  Administered 2015-08-10: 150 mg via INTRAVENOUS

## 2015-08-10 MED ORDER — PROPOFOL 10 MG/ML IV BOLUS
INTRAVENOUS | Status: AC
  Filled 2015-08-10: qty 20

## 2015-08-10 MED ORDER — LORAZEPAM 2 MG/ML IJ SOLN
2.0000 mg | INTRAMUSCULAR | Status: DC | PRN
  Administered 2015-08-12: 2 mg via INTRAVENOUS
  Filled 2015-08-10: qty 1

## 2015-08-10 MED ORDER — VANCOMYCIN HCL IN DEXTROSE 1-5 GM/200ML-% IV SOLN
INTRAVENOUS | Status: AC
  Administered 2015-08-10: 1000 mg via INTRAVENOUS
  Filled 2015-08-10: qty 200

## 2015-08-10 MED ORDER — SODIUM CHLORIDE 0.9 % IV SOLN
INTRAVENOUS | Status: DC
  Administered 2015-08-10 – 2015-08-11 (×4): via INTRAVENOUS

## 2015-08-10 MED ORDER — ACETAMINOPHEN 325 MG PO TABS: 650.0000 mg | ORAL_TABLET | Freq: Four times a day (QID) | ORAL | Status: DC | PRN

## 2015-08-10 MED ORDER — FENTANYL CITRATE (PF) 250 MCG/5ML IJ SOLN
INTRAMUSCULAR | Status: AC
  Filled 2015-08-10: qty 5

## 2015-08-10 MED ORDER — ONDANSETRON HCL 4 MG/2ML IJ SOLN
INTRAMUSCULAR | Status: DC | PRN
  Administered 2015-08-10: 4 mg via INTRAVENOUS

## 2015-08-10 MED ORDER — BOOST / RESOURCE BREEZE PO LIQD
1.0000 | Freq: Three times a day (TID) | ORAL | Status: DC
  Administered 2015-08-11: 1 via ORAL

## 2015-08-10 MED ORDER — DEXAMETHASONE SODIUM PHOSPHATE 10 MG/ML IJ SOLN
INTRAMUSCULAR | Status: AC
  Filled 2015-08-10: qty 1

## 2015-08-10 MED ORDER — MIDAZOLAM HCL 2 MG/2ML IJ SOLN
INTRAMUSCULAR | Status: AC
  Filled 2015-08-10: qty 4

## 2015-08-10 MED ORDER — SODIUM CHLORIDE 0.9% FLUSH: 3.0000 mL | Freq: Two times a day (BID) | INTRAVENOUS | Status: DC

## 2015-08-10 MED ORDER — DEXTROSE 5 % IV SOLN: 500.0000 mg | Freq: Four times a day (QID) | INTRAVENOUS | Status: DC | PRN

## 2015-08-10 MED ORDER — FENTANYL CITRATE (PF) 100 MCG/2ML IJ SOLN
INTRAMUSCULAR | Status: AC
  Filled 2015-08-10: qty 2

## 2015-08-10 MED ORDER — DEXAMETHASONE SODIUM PHOSPHATE 10 MG/ML IJ SOLN
INTRAMUSCULAR | Status: DC | PRN
  Administered 2015-08-10: 10 mg via INTRAVENOUS

## 2015-08-10 MED ORDER — SUCCINYLCHOLINE CHLORIDE 200 MG/10ML IV SOSY
PREFILLED_SYRINGE | INTRAVENOUS | Status: AC
  Filled 2015-08-10: qty 10

## 2015-08-10 MED ORDER — KETOROLAC TROMETHAMINE 30 MG/ML IJ SOLN
INTRAMUSCULAR | Status: AC
  Filled 2015-08-10: qty 1

## 2015-08-10 MED ORDER — SODIUM CHLORIDE 0.9 % IR SOLN
Status: DC | PRN
  Administered 2015-08-10: 1000 mL
  Administered 2015-08-10: 3000 mL

## 2015-08-10 MED ORDER — ONDANSETRON HCL 4 MG/2ML IJ SOLN
INTRAMUSCULAR | Status: AC
  Filled 2015-08-10: qty 2

## 2015-08-10 MED ORDER — LACTATED RINGERS IV SOLN: INTRAVENOUS | Status: DC | PRN

## 2015-08-10 MED ORDER — PIPERACILLIN-TAZOBACTAM 3.375 G IVPB
3.3750 g | Freq: Once | INTRAVENOUS | Status: DC
  Filled 2015-08-10: qty 50

## 2015-08-10 MED ORDER — BACITRACIN-NEOMYCIN-POLYMYXIN 400-5-5000 EX OINT
TOPICAL_OINTMENT | CUTANEOUS | Status: AC
  Filled 2015-08-10: qty 1

## 2015-08-10 MED ORDER — FENTANYL CITRATE (PF) 100 MCG/2ML IJ SOLN
INTRAMUSCULAR | Status: DC | PRN
  Administered 2015-08-10 (×2): 50 ug via INTRAVENOUS
  Administered 2015-08-10: 150 ug via INTRAVENOUS

## 2015-08-10 MED ORDER — OXYCODONE HCL 5 MG PO TABS: 10.0000 mg | ORAL_TABLET | ORAL | Status: DC | PRN

## 2015-08-10 SURGICAL SUPPLY — 49 items
BANDAGE ELASTIC 3 VELCRO ST LF (GAUZE/BANDAGES/DRESSINGS) ×2 IMPLANT
BANDAGE ELASTIC 4 VELCRO ST LF (GAUZE/BANDAGES/DRESSINGS) ×2 IMPLANT
BNDG CONFORM 2 STRL LF (GAUZE/BANDAGES/DRESSINGS) IMPLANT
BNDG GAUZE ELAST 4 BULKY (GAUZE/BANDAGES/DRESSINGS) ×2 IMPLANT
CORDS BIPOLAR (ELECTRODE) ×2 IMPLANT
CUFF TOURNIQUET SINGLE 18IN (TOURNIQUET CUFF) ×2 IMPLANT
CUFF TOURNIQUET SINGLE 24IN (TOURNIQUET CUFF) IMPLANT
DRAIN HEMOVAC 7FR (DRAIN) ×2 IMPLANT
DRSG ADAPTIC 3X8 NADH LF (GAUZE/BANDAGES/DRESSINGS) ×2 IMPLANT
DRSG PAD ABDOMINAL 8X10 ST (GAUZE/BANDAGES/DRESSINGS) ×2 IMPLANT
EVACUATOR SILICONE 100CC (DRAIN) ×2 IMPLANT
GAUZE SPONGE 4X4 12PLY STRL (GAUZE/BANDAGES/DRESSINGS) ×2 IMPLANT
GAUZE SPONGE 4X4 16PLY XRAY LF (GAUZE/BANDAGES/DRESSINGS) ×2 IMPLANT
GAUZE XEROFORM 1X8 LF (GAUZE/BANDAGES/DRESSINGS) ×2 IMPLANT
GAUZE XEROFORM 5X9 LF (GAUZE/BANDAGES/DRESSINGS) ×2 IMPLANT
GLOVE BIOGEL M 8.0 STRL (GLOVE) ×2 IMPLANT
GLOVE SS BIOGEL STRL SZ 8 (GLOVE) ×1 IMPLANT
GLOVE SUPERSENSE BIOGEL SZ 8 (GLOVE) ×1
GOWN STRL REUS W/ TWL LRG LVL3 (GOWN DISPOSABLE) ×1 IMPLANT
GOWN STRL REUS W/ TWL XL LVL3 (GOWN DISPOSABLE) ×2 IMPLANT
GOWN STRL REUS W/TWL LRG LVL3 (GOWN DISPOSABLE) ×1
GOWN STRL REUS W/TWL XL LVL3 (GOWN DISPOSABLE) ×2
HANDPIECE INTERPULSE COAX TIP (DISPOSABLE)
KIT BASIN OR (CUSTOM PROCEDURE TRAY) ×2 IMPLANT
KIT ROOM TURNOVER OR (KITS) ×2 IMPLANT
MANIFOLD NEPTUNE II (INSTRUMENTS) ×2 IMPLANT
NEEDLE HYPO 25GX1X1/2 BEV (NEEDLE) IMPLANT
NERVE PROTECTOR NEURAWRAP 7MM (Tissue) ×2 IMPLANT
NS IRRIG 1000ML POUR BTL (IV SOLUTION) ×2 IMPLANT
PACK ORTHO EXTREMITY (CUSTOM PROCEDURE TRAY) ×2 IMPLANT
PAD ARMBOARD 7.5X6 YLW CONV (MISCELLANEOUS) ×2 IMPLANT
PAD CAST 4YDX4 CTTN HI CHSV (CAST SUPPLIES) ×1 IMPLANT
PADDING CAST COTTON 4X4 STRL (CAST SUPPLIES) ×1
PROTECTOR NERVE NEURAWRAP 7MM (Tissue) ×1 IMPLANT
SET CYSTO W/LG BORE CLAMP LF (SET/KITS/TRAYS/PACK) ×2 IMPLANT
SET HNDPC FAN SPRY TIP SCT (DISPOSABLE) IMPLANT
SPLINT FIBERGLASS 4X30 (CAST SUPPLIES) ×2 IMPLANT
SPONGE LAP 4X18 X RAY DECT (DISPOSABLE) ×2 IMPLANT
SUT PROLENE 2 0 SH 30 (SUTURE) ×2 IMPLANT
SUT PROLENE 3 0 PS 2 (SUTURE) ×2 IMPLANT
SUT PROLENE 3 0 SH DA (SUTURE) ×2 IMPLANT
SUT PROLENE 4 0 PS 2 18 (SUTURE) ×6 IMPLANT
SYR CONTROL 10ML LL (SYRINGE) IMPLANT
TOWEL OR 17X24 6PK STRL BLUE (TOWEL DISPOSABLE) ×2 IMPLANT
TOWEL OR 17X26 10 PK STRL BLUE (TOWEL DISPOSABLE) ×2 IMPLANT
TUBE ANAEROBIC SPECIMEN COL (MISCELLANEOUS) ×2 IMPLANT
TUBE CONNECTING 12X1/4 (SUCTIONS) ×2 IMPLANT
WATER STERILE IRR 1000ML POUR (IV SOLUTION) ×2 IMPLANT
YANKAUER SUCT BULB TIP NO VENT (SUCTIONS) ×2 IMPLANT

## 2015-08-10 NOTE — Op Note (Signed)
See dictation#[id number] Patient was able to be closed primarily over drains. He had extensive reconstruction of his forearm.  I would recommend Vanc and Zosyn for 24-48 hours followed by outpatient antibiotics (Cipro 500 mg twice a day 14 days)  Await Behavioral Health recommendations. Given the fact that this is his second suicide attempt within months we hope he can get some help  He is okay to begin range of motion to the fingers and we encourage him to keep the bandage clean and dry until follow-up in office in 7-10 days  Pao Haffey M.D.

## 2015-08-10 NOTE — ED Notes (Signed)
[place] [address] [facility] [place]

## 2015-08-10 NOTE — Anesthesia Procedure Notes (Signed)
Procedure Name: LMA Insertion Date/Time: 08/10/2015 8:19 PM Performed by: Orvilla FusATO, Taleah Bellantoni A Pre-anesthesia Checklist: Patient identified, Emergency Drugs available, Suction available, Patient being monitored and Timeout performed Patient Re-evaluated:Patient Re-evaluated prior to inductionOxygen Delivery Method: Circle system utilized Preoxygenation: Pre-oxygenation with 100% oxygen Intubation Type: IV induction Ventilation: Mask ventilation without difficulty LMA: LMA inserted LMA Size: 5.0 Number of attempts: 1 Placement Confirmation: positive ETCO2 Tube secured with: Tape Dental Injury: Teeth and Oropharynx as per pre-operative assessment

## 2015-08-10 NOTE — ED Notes (Signed)
Attempted report to [address]. Nurse busy

## 2015-08-10 NOTE — ED Provider Notes (Addendum)
CSN: [id number]     Arrival date & time 08/10/15  0920 History   First MD Initiated Contact with Patient 08/10/15 1011     Chief Complaint  Patient presents with  . Extremity Laceration     (Consider location/radiation/quality/duration/timing/severity/associated sxs/prior Treatment) Patient is a 50 y.o. male presenting with depression and skin laceration. The history is provided by the patient. No language interpreter was used.  Depression This is a recurrent problem. The current episode started in the past 7 days. The problem occurs constantly. The problem has been gradually worsening. Associated symptoms include myalgias. Nothing aggravates the symptoms. He has tried nothing for the symptoms. The treatment provided moderate relief.  Laceration Location:  Shoulder/arm Shoulder/arm laceration location:  R forearm Length (cm):  21cm Depth:  Through underlying tissue Quality: straight   Time since incident:  2 days Laceration mechanism:  Razor Pain details:    Quality:  Aching   Severity:  Moderate   Timing:  Constant Foreign body present:  No foreign bodies Relieved by:  Nothing Worsened by:  Nothing tried Ineffective treatments:  None tried Tetanus status:  Up to date Pt cut his wrist 2 days ago.  Pt reports he was feeling suicidal.  Pt reports pervious laceration is also from a suicide attempt.  Pt has a history of alcohol and substance abuse.   Past Medical History  Diagnosis Date  . Erectile dysfunction   . Chronic back pain     [facility]   Past Surgical History  Procedure Laterality Date  . Ankle surgery      right  . Hand surgery      fracture, ORIG, teenage years, right.  . Lumbar epidural injection      [facility]  . Tendon repair N/A 07/24/2015    Procedure: WRIST LACERATION AND TENDON REPAIR;  Surgeon: Bradly Bienenstock, MD;  Location: [facility];  Service: Orthopedics;  Laterality: N/A;   Family History  Problem Relation Age  of Onset  . Cancer Mother     lung  . Cancer Father     throat  . Diabetes Paternal Grandmother   . Heart disease Neg Hx   . Stroke Neg Hx    Social History  Substance Use Topics  . Smoking status: Current Every Day Smoker -- 0.50 packs/day for 6 years    Types: Cigarettes  . Smokeless tobacco: None  . Alcohol Use: Yes     Comment: occasional    Review of Systems  Musculoskeletal: Positive for myalgias.  Psychiatric/Behavioral: Positive for depression.  All other systems reviewed and are negative.     Allergies  Review of patient's allergies indicates no known allergies.  Home Medications   Prior to Admission medications   Medication Sig Start Date End Date Taking? Authorizing Provider  HYDROcodone-acetaminophen (NORCO) 10-325 MG tablet Take 1 tablet by mouth 4 (four) times daily as needed (pain).  08/03/15  Yes Historical Provider, MD  docusate sodium (COLACE) 100 MG capsule Take 1 capsule (100 mg total) by mouth 2 (two) times daily. 07/24/15   Bradly Bienenstock, MD  oxyCODONE-acetaminophen (PERCOCET) 10-325 MG tablet Take 1 tablet by mouth every 4 (four) hours as needed for pain. 07/24/15   Bradly Bienenstock, MD  Probiotic Product (ALIGN) 4 MG CAPS Take 1 capsule by mouth daily. Patient not taking: Reported on 05/24/2015 12/19/14   Servando Salina, NP  sildenafil (VIAGRA) 100 MG tablet TAKE 1/2 TO 1 TABLET BY MOUTH EVERY DAY  AS NEEDED FOR ERECTILE DYSFUNCTION 05/24/15   Kermit Baloavid S Tysinger, PA-C   BP 158/95 mmHg  Pulse 62  Temp(Src) 98.2 F (36.8 C) (Oral)  Resp 20  Ht 6\' 2"  (1.88 m)  Wt 83.915 kg  BMI 23.74 kg/m2  SpO2 97% Physical Exam  Constitutional: He is oriented to person, place, and time. He appears well-developed and well-nourished.  HENT:  Head: Normocephalic.  Eyes: EOM are normal.  Neck: Normal range of motion.  Pulmonary/Chest: Effort normal.  Abdominal: He exhibits no distension.  Musculoskeletal: Normal range of motion.  15cm gapping laceration right forearm,   Large swollen muscle belly protruding. 6 cm laceration gapping,  No deep penetration   Neurological: He is alert and oriented to person, place, and time.  Psychiatric: He has a normal mood and affect.  Nursing note and vitals reviewed.   ED Course  Procedures (including critical care time) Labs Review Labs Reviewed  CBC WITH DIFFERENTIAL/PLATELET - Abnormal; Notable for the following:    Hemoglobin 11.8 (*)    HCT 34.9 (*)    MCV 76.4 (*)    MCH 25.8 (*)    All other components within normal limits  BASIC METABOLIC PANEL - Abnormal; Notable for the following:    BUN 21 (*)    Creatinine, Ser 1.64 (*)    GFR calc non Af Amer 48 (*)    GFR calc Af Amer 56 (*)    All other components within normal limits  ETHANOL  URINE RAPID DRUG SCREEN, HOSP PERFORMED  SALICYLATE LEVEL  ACETAMINOPHEN LEVEL    Imaging Review No results found. I have personally reviewed and evaluated these images and lab results as part of my medical decision-making.   EKG Interpretation None      MDM  Pt given rocephin IV.  Medical screen labs,   I will consult Dr. Amanda PeaGramig.  Wound is old,  Odor of infection but no drainage.     Final diagnoses:  Laceration of right arm with complication, initial encounter  Depression  Suicide attempt (HCC)    Pt seen by Dr. Amanda PeaGramig.  Pt will need debriedmentt in OR.  Pt will need to stay for infection and wound vacuum. He reports he will need medicine to admit for management of antibiotics, detox and substance abuse.  Pt also needs Psychiatric treatment for depression, suicide attempts and withdrawal.    Elson AreasLeslie K Latavious Bitter, PA-C 08/10/15 [address]1605  Landry Lookingbill K [place], [place] 08/10/15 1605  Jerelyn ScottMartha Linker, MD 08/10/15 [address]  Adianna Darwin K [place], PA-C 08/10/15 1728  Jerelyn ScottMartha Linker, MD 08/10/15 [phone]

## 2015-08-10 NOTE — ED Notes (Signed)
Patient undressed, in gown, on continuous pulse oximetry and blood pressure cuff 

## 2015-08-10 NOTE — ED Notes (Signed)
Having to soak bandage off.  Driied to skin.
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
## 2015-08-10 NOTE — Anesthesia Postprocedure Evaluation (Signed)
Anesthesia Post Note  Patient: Benjamin HaagensenJohn Ruder  Procedure(s) Performed: Procedure(s) (LRB): IRRIGATION AND DEBRIDEMENT SKIN, SUBCUTANEOS TISSUE AND  MUSCLE, CARPAL TUNNEL RELEASE, MEDIAN NERVE LYSIS WITH NERVE WRAP (Right)  Patient location during evaluation: PACU Anesthesia Type: General Level of consciousness: awake and alert Pain management: pain level controlled Vital Signs Assessment: post-procedure vital signs reviewed and stable Respiratory status: spontaneous breathing, nonlabored ventilation, respiratory function stable and patient connected to nasal cannula oxygen Cardiovascular status: blood pressure returned to baseline and stable Postop Assessment: no signs of nausea or vomiting Anesthetic complications: no    Last Vitals:  Filed Vitals:   08/10/15 2220 08/10/15 2235  BP: 182/110 171/119  Pulse: 68 57  Temp: 36.4 C   Resp: 10 13    Last Pain:  Filed Vitals:   08/10/15 2241  PainSc: Asleep                 Sharnita Bogucki S

## 2015-08-10 NOTE — ED Notes (Signed)
Patient states cut arm x 2 days ago.   Patient states "I don't want to talk about it right now".   Patient then stated he did it at work.  Patient wouldn't elaborate.   Patient states now he was drinking and unsure of what happened.  Patient states he was "smoking reefer".   Patient came in with wound wrapped.  Patient states had not taken the bandage off in 2 days.   Dried blood on bandage.  Will have to unwrap to assess.

## 2015-08-10 NOTE — ED Notes (Addendum)
Placed patient's arm in a basin filled with a combination of normal saline and hydrogen peroxide to soak off the 3 day old blood stained bandages

## 2015-08-10 NOTE — Op Note (Signed)
NAME:  Benjamin, [name] NO.:  [id number]  MEDICAL RECORD NO.:  [id number]  LOCATION:  [facility]                         FACILITY:  [facility]  PHYSICIAN:  Dionne Ano. Samyia Motter, M.D.DATE OF BIRTH:  19-May-1965  DATE OF PROCEDURE: DATE OF DISCHARGE:                              OPERATIVE REPORT   PREOPERATIVE DIAGNOSIS:  Status post attempted suicide with laceration to the right arm two days ago with gaping wound, loss of sensation to the thumb and disarray of the soft tissues.  POSTOPERATIVE DIAGNOSIS:  Status post attempted suicide with laceration to the right arm two days ago with gaping wound, loss of sensation to the thumb and disarray of the soft tissues with noted findings of prior median nerve type injury with dense scar tissue formation around the nerve as well as an accessory manus muscle in the volar forearm.  SURGICAL PROCEDURE PERFORMED: 1. Irrigation and debridement of skin, subcutaneous tissue, muscle,     and tendon.  This was an excisional debridement with knife, scissor     and curette, 15 cm in length, 2 inches in depth. 2. Median nerve neurolysis, extensive in nature. 3. NeuroGen 7-mm wrap placed around the median nerve. 4. Open carpal tunnel release. 5. Advanced tenosynovectomy, radical in nature, wrist, hand, forearm. 6. Excision/tenotomy, palmaris longus tendon. 7. Resection of accessory muscle, forearm in the form of a large manus     type forearm muscle. 8. Complex closure over two JP drains, size 7, right forearm 20 cm in     length. 9. Rotation flap, right forearm, 4-cm laceration. 10.Fasciotomy, deep and volar forearm compartments, right arm.  SURGEON:  Dionne Ano. Amanda Pea, M.D.  ASSISTANT:  Karie Chimera, P.A.-C.  COMPLICATIONS:  None.  ANESTHESIA:  General.  TOURNIQUET TIME:  Less than an hour.  DRAINS:  Two JP drains.  ESTIMATED BLOOD LOSS:  Less than 100 mL.  INDICATIONS:  This patient is a 50 year old male who presents with  the above-mentioned problems.  The patient had an attempted suicide attempt a month ago.  He was treated by Dr. Melvyn Novas.  Subsequent to this, he had a recent suicide attempt 2 days ago.  He has been unattended to and thus came into the ER today for my evaluation.  He has not had any treatment for 2 days, has some very severe injuries to the forearm.  He has a 20- cm laceration to the volar forearm with an additional 4-5 cm laceration in the proximal forearm.  Median nerve is questionable in terms of its integrity.  He has disarray of his soft tissues.  There was no cellulitis interestingly enough and no signs of compartment syndrome at present time.  I have discussed with him the risks and benefits of surgery, do's and don'ts, etc.  We had a very difficult time getting this gentleman any professional help in terms of his behavioral issues.  Certainly, he is a Hydrographic surveyor, I did not feel comfortable managing his behavioral healthcare. I have discussed with the patient the issues at hand, he was admitted by the Hospitalist Service as he has a history of polysubstance abuse including alcohol and cocaine as well as hypertension and other recent downward spiraling  issues.  The main issue he has is a behavioral health issue in my opinion.  As mentioned, I did not feel comfortable treating his behavioral health issues and suicidal ideations and we have strongly urge Behavior Health to engage.  This was recommended by the Hospitalist, the emergency room staff and of course, I would echo these thoughts.  I feel that the big issue is his suicide attempts.  Whether he is just reaching out for tension or quite serious is always an issue. I would state for the record that the lacerations today are multiple in nature, very deep and certainly indicated seriousness beyond the simple cut.  He was prepped for surgery, he understands risks and benefits.  OPERATIVE PROCEDURE:  The patient was seen by  myself and Anesthesia, taken to the operative suite, underwent a smooth induction of general anesthesia, was prepped and draped in usual sterile fashion with the preoperative Hibiclens scrub followed by 10 minutes surgical Betadine scrub and paint followed by elevation of the arm.  Tourniquet was insufflated to 250 mmHg.  The patient underwent I and D of skin, subcutaneous tissue, muscle, and tendon, surveyed the landscape showed an accessory manus muscle in the forearm as well as old scar tissue and an old incision, which would represent almost an extended carpal tunnel release-type incision.  I reviewed this and evacuated notable clot. Following this, we then incised 1-mm rim of skin tissue and subcu tissue.  We then I and D'ed the muscle and tendinous tissue.  This was an I and D of skin, tendon, muscle and subcutaneous fat.  The bone was not involved, this was an excisional debridement with curette, knife, blade, and scissor.  The patient tolerated this well.  There were no complicating features. Following this, I then identified a 5-cm laceration that is approximately and performed a similar irrigation and debridement process as outlined previously.  Once this was complete, we then placed 6 L through and through the area.  Following this, we then very carefully and cautiously performed an open carpal tunnel release.  It was quite apparent.  The median nerve was guardian against the radial leaflet.  I would surmise that the patient either had an old carpal tunnel release with poor recovery or an injury in the distant past with median nerve laceration versus partial injury. The median nerve was very carefully teased away from the radial leaflet. I was very carefully released the entire carpal tunnel, which had noted hematoma in it, which was evacuated.  Following carpal tunnel release, I have turned attention toward the median nerve.  I performed a complete median nerve neurolysis  in the hand, wrist and forearm.  Following median nerve neurolysis, I then performed a radical tenosynovectomy of the flexor tendons including the flexor tendon of the index through small fingers, FDS, FDP as well as FPL.  Following this, I identified the ulnar nerve and artery, they were noted to be intact.  After the radical tenosynovectomy, we then performed very careful and cautious excision of the palmaris longus.  I performed a palmaris longus tenotomy.  Following this, I performed a removal of the manus muscle.  This was large the flexor manus muscle about the wrist, it had investments proximally to the superficial fascia and investments distally to the ulnar leaflet of the transverse carpal region.  The patient had this excised in total.  Following this, I then placed two JP drains, I then placed the fascia through release routine both superficial and deep fascia,  underwent a compartment release and fasciotomy to prevent swelling and problems into the future.  The patient tolerated this well.  There were no complicating features.  Following this, we then very carefully and cautiously performed an approach of irrigation once again followed by rotation flap closure of the 5-cm wound with step cuts and Prolene closure.  Following this, I performed a very careful and cautious closure complex in nature of the 20-cm laceration over the drains.  The patient tolerated this well.  There were no complicating features.  He had excellent pulse refill and the patient had excellent soft tissue parameters at the time of the cessation of surgery.  He will be admitted by the Hospitalist and I would recommend vancomycin and Zosyn for 24-48 hours followed by an outpatient antibiotic program and close followup in our office.  Do's and don'ts have been discussed and all questions had been encouraged and answered.  Pleasure to see him today and participate in his care.  Hopefully, he can find  some peace and happiness.  I think he is in for a long haul with his injuries in the forearm, which are multiple.  We did remove sutures in his left forearm, which were placed by Dr. Melvyn Novasrtmann and he has had poor followup care of.     Dionne AnoWilliam M. Amanda PeaGramig, M.D.     [facility] IncWMG/MEDQ  D:  08/10/2015  T:  08/10/2015  Job:  [id number]
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
## 2015-08-10 NOTE — Transfer of Care (Signed)
Immediate Anesthesia Transfer of Care Note  Patient: Benjamin HaagensenJohn Capote  Procedure(s) Performed: Procedure(s): IRRIGATION AND DEBRIDEMENT SKIN, SUBCUTANEOS TISSUE AND  MUSCLE, CARPAL TUNNEL RELEASE, MEDIAN NERVE LYSIS WITH NERVE WRAP (Right)  Patient Location: PACU  Anesthesia Type:General  Level of Consciousness: sedated, patient cooperative and responds to stimulation  Airway & Oxygen Therapy: Patient Spontanous Breathing and Patient connected to nasal cannula oxygen  Post-op Assessment: Report given to RN, Post -op Vital signs reviewed and stable and Patient moving all extremities X 4  Post vital signs: Reviewed and stable  Last Vitals:  Filed Vitals:   08/10/15 1800 08/10/15 1830  BP: 116/62 108/69  Pulse: 52 56  Temp:    Resp: 18 18    Last Pain:  Filed Vitals:   08/10/15 1901  PainSc: 6          Complications: No apparent anesthesia complications

## 2015-08-10 NOTE — ED Notes (Signed)
Pt to short stay  

## 2015-08-10 NOTE — Anesthesia Preprocedure Evaluation (Addendum)
Anesthesia Evaluation  Patient identified by MRN, date of birth, ID band Patient awake    Reviewed: Allergy & Precautions, NPO status , Patient's Chart, lab work & pertinent test results  History of Anesthesia Complications Negative for: history of anesthetic complications  Airway Mallampati: II  TM Distance: >3 FB Neck ROM: Full    Dental no notable dental hx.    Pulmonary neg pulmonary ROS, Current Smoker,    Pulmonary exam normal breath sounds clear to auscultation       Cardiovascular Exercise Tolerance: Good negative cardio ROS Normal cardiovascular exam Rhythm:Regular Rate:Normal     Neuro/Psych PSYCHIATRIC DISORDERS Depression Multiple suicide attempts negative neurological ROS  negative psych ROS   GI/Hepatic negative GI ROS, Neg liver ROS, (+)     substance abuse  alcohol use, cocaine use and marijuana use,   Endo/Other  negative endocrine ROS  Renal/GU Renal diseasenegative Renal ROS  negative genitourinary   Musculoskeletal negative musculoskeletal ROS (+)   Abdominal   Peds negative pediatric ROS (+)  Hematology negative hematology ROS (+)   Anesthesia Other Findings   Reproductive/Obstetrics negative OB ROS                           Lab Results  Component Value Date   WBC 5.4 08/10/2015   HGB 11.8* 08/10/2015   HCT 34.9* 08/10/2015   MCV 76.4* 08/10/2015   PLT 182 08/10/2015     Chemistry      Component Value Date/Time   NA 136 08/10/2015 1047   K 3.6 08/10/2015 1047   CL 104 08/10/2015 1047   CO2 25 08/10/2015 1047   BUN 21* 08/10/2015 1047   CREATININE 1.64* 08/10/2015 1047   CREATININE 1.40* 05/24/2015 0001      Component Value Date/Time   CALCIUM 9.1 08/10/2015 1047   ALKPHOS 33* 05/24/2015 0001   AST 43* 05/24/2015 0001   ALT 37 05/24/2015 0001   BILITOT 1.4* 05/24/2015 0001     BP Readings from Last 3 Encounters:  08/10/15 139/68  07/24/15  143/97  05/24/15 124/88    Anesthesia Physical Anesthesia Plan  ASA: II  Anesthesia Plan: General   Post-op Pain Management:    Induction: Intravenous  Airway Management Planned: Oral ETT and LMA  Additional Equipment:   Intra-op Plan:   Post-operative Plan: Extubation in OR  Informed Consent: I have reviewed the patients History and Physical, chart, labs and discussed the procedure including the risks, benefits and alternatives for the proposed anesthesia with the patient or authorized representative who has indicated his/her understanding and acceptance.   Dental advisory given  Plan Discussed with: CRNA and Surgeon  Anesthesia Plan Comments:         Anesthesia Quick Evaluation

## 2015-08-10 NOTE — ED Notes (Signed)
Orthopedic MD at the bedside. 

## 2015-08-10 NOTE — ED Notes (Signed)
When pt is asked about his wound on his right forearm, pt states " I am very depressed and I tried to kill myself two days ago."  Pt reports a lot of stress and death in his life. Pt states his mother and grandmother passed away recently. Pt states he wants to talk with someone regarding his depression.

## 2015-08-10 NOTE — OR Nursing (Signed)
[facility]  Ancef discontinued, pt to be on vanc/zosyn as previously ordered.

## 2015-08-11 ENCOUNTER — Encounter (HOSPITAL_COMMUNITY): Payer: Self-pay | Admitting: Orthopedic Surgery

## 2015-08-11 DIAGNOSIS — F1410 Cocaine abuse, uncomplicated: Secondary | ICD-10-CM

## 2015-08-11 DIAGNOSIS — F329 Major depressive disorder, single episode, unspecified: Secondary | ICD-10-CM

## 2015-08-11 DIAGNOSIS — R634 Abnormal weight loss: Secondary | ICD-10-CM

## 2015-08-11 DIAGNOSIS — E860 Dehydration: Secondary | ICD-10-CM

## 2015-08-11 DIAGNOSIS — T1491 Suicide attempt: Secondary | ICD-10-CM

## 2015-08-11 DIAGNOSIS — F1010 Alcohol abuse, uncomplicated: Secondary | ICD-10-CM

## 2015-08-11 DIAGNOSIS — R45851 Suicidal ideations: Secondary | ICD-10-CM

## 2015-08-11 DIAGNOSIS — F4321 Adjustment disorder with depressed mood: Secondary | ICD-10-CM

## 2015-08-11 DIAGNOSIS — X789XXS Intentional self-harm by unspecified sharp object, sequela: Secondary | ICD-10-CM

## 2015-08-11 DIAGNOSIS — S41111D Laceration without foreign body of right upper arm, subsequent encounter: Secondary | ICD-10-CM

## 2015-08-11 LAB — COMPREHENSIVE METABOLIC PANEL
ALT: 23 U/L (ref 17–63)
ANION GAP: 11 (ref 5–15)
AST: 31 U/L (ref 15–41)
Albumin: 3.9 g/dL (ref 3.5–5.0)
Alkaline Phosphatase: 27 U/L — ABNORMAL LOW (ref 38–126)
BUN: 17 mg/dL (ref 6–20)
CHLORIDE: 107 mmol/L (ref 101–111)
CO2: 22 mmol/L (ref 22–32)
Calcium: 8.4 mg/dL — ABNORMAL LOW (ref 8.9–10.3)
Creatinine, Ser: 1.63 mg/dL — ABNORMAL HIGH (ref 0.61–1.24)
GFR, EST AFRICAN AMERICAN: 56 mL/min — AB (ref >60)
GFR, EST NON AFRICAN AMERICAN: 49 mL/min — AB (ref >60)
Glucose, Bld: 62 mg/dL — ABNORMAL LOW (ref 65–99)
POTASSIUM: 4 mmol/L (ref 3.5–5.1)
Sodium: 140 mmol/L (ref 135–145)
TOTAL PROTEIN: 5.8 g/dL — AB (ref 6.5–8.1)
Total Bilirubin: 2.4 mg/dL — ABNORMAL HIGH (ref 0.3–1.2)

## 2015-08-11 LAB — CBC
HCT: 33.1 % — ABNORMAL LOW (ref 39.0–52.0)
Hemoglobin: 10.6 g/dL — ABNORMAL LOW (ref 13.0–17.0)
MCH: 25.1 pg — ABNORMAL LOW (ref 26.0–34.0)
MCHC: 32 g/dL (ref 30.0–36.0)
MCV: 78.3 fL (ref 78.0–100.0)
PLATELETS: 191 10*3/uL (ref 150–400)
RBC: 4.23 MIL/uL (ref 4.22–5.81)
RDW: 14.8 % (ref 11.5–15.5)
WBC: 9.6 10*3/uL (ref 4.0–10.5)

## 2015-08-11 LAB — GLUCOSE, CAPILLARY
GLUCOSE-CAPILLARY: 112 mg/dL — AB (ref 65–99)
GLUCOSE-CAPILLARY: 109 mg/dL — AB (ref 65–99)

## 2015-08-11 LAB — TSH: TSH: 2.31 u[IU]/mL (ref 0.350–4.500)

## 2015-08-11 LAB — MRSA PCR SCREENING: MRSA by PCR: NEGATIVE

## 2015-08-11 MED ORDER — VANCOMYCIN HCL IN DEXTROSE 1-5 GM/200ML-% IV SOLN
1000.0000 mg | Freq: Two times a day (BID) | INTRAVENOUS | Status: AC
  Administered 2015-08-11 – 2015-08-12 (×4): 1000 mg via INTRAVENOUS
  Filled 2015-08-11 (×5): qty 200

## 2015-08-11 MED ORDER — OXYCODONE HCL 5 MG PO TABS
10.0000 mg | ORAL_TABLET | ORAL | Status: DC | PRN
  Administered 2015-08-11 – 2015-08-13 (×9): 10 mg via ORAL
  Filled 2015-08-11 (×9): qty 2

## 2015-08-11 MED ORDER — TRAZODONE HCL 50 MG PO TABS
50.0000 mg | ORAL_TABLET | Freq: Every day | ORAL | Status: DC
  Administered 2015-08-11 – 2015-08-12 (×2): 50 mg via ORAL
  Filled 2015-08-11 (×2): qty 1

## 2015-08-11 MED ORDER — ADULT MULTIVITAMIN W/MINERALS CH
1.0000 | ORAL_TABLET | Freq: Every day | ORAL | Status: DC
  Administered 2015-08-11 – 2015-08-13 (×3): 1 via ORAL
  Filled 2015-08-11 (×3): qty 1

## 2015-08-11 MED ORDER — VITAMIN B-1 100 MG PO TABS
100.0000 mg | ORAL_TABLET | Freq: Every day | ORAL | Status: DC
  Administered 2015-08-12 – 2015-08-13 (×2): 100 mg via ORAL
  Filled 2015-08-11 (×2): qty 1

## 2015-08-11 MED ORDER — PIPERACILLIN-TAZOBACTAM 3.375 G IVPB
3.3750 g | Freq: Three times a day (TID) | INTRAVENOUS | Status: AC
  Administered 2015-08-11 – 2015-08-12 (×6): 3.375 g via INTRAVENOUS
  Filled 2015-08-11 (×7): qty 50

## 2015-08-11 MED ORDER — DULOXETINE HCL 30 MG PO CPEP
30.0000 mg | ORAL_CAPSULE | Freq: Every day | ORAL | Status: DC
  Administered 2015-08-11 – 2015-08-13 (×3): 30 mg via ORAL
  Filled 2015-08-11 (×3): qty 1

## 2015-08-11 MED ORDER — FOLIC ACID 1 MG PO TABS
1.0000 mg | ORAL_TABLET | Freq: Every day | ORAL | Status: DC
  Administered 2015-08-12 – 2015-08-13 (×2): 1 mg via ORAL
  Filled 2015-08-11 (×2): qty 1

## 2015-08-11 NOTE — Consult Note (Signed)
[facility]iat[facility] Consult   Reason for Consult:  Depression, bereavement and polysubstance abuse Referring Physician:  Dr. Algis Liming Patient Identification: Benjamin [name] MRN:  [id number] Principal Diagnosis: Suicide attempt [facility]) Diagnosis:   Patient Active Problem List   Diagnosis Date Noted  . Suicide attempt ([place]) [T14.91] 08/10/2015  . Laceration of right arm with complication [M35.597C] 16/38/4536  . Uncontrolled hypertension [I10] 08/10/2015  . Alcohol abuse [F10.10] 08/10/2015  . Cocaine abuse [F14.10] 08/10/2015  . Dehydration [E86.0] 08/10/2015  . Weight loss, non-intentional [R63.4] 08/10/2015  . Depression [F32.9] 08/10/2015  . Essential hypertension [I10]   . Vaccine refused by patient [Z28.20] 05/24/2015  . Routine general medical examination at a health care facility [Z00.00] 05/24/2015  . Chronic back pain [M54.9, G89.29] 05/24/2015  . Abnormal serum creatinine level [R79.9] 05/24/2015  . Family history of cancer [Z80.9] 05/24/2015  . Screening for prostate cancer [Z12.5] 05/24/2015  . Smoker [Z72.0] 05/24/2015  . Cough [R05] 05/24/2015  . Screen for STD (sexually transmitted disease) [Z11.3] 05/24/2015  . Erectile dysfunction [N52.9] 01/31/2014    Total Time spent with patient: 1 hour  Subjective:   Benjamin [name] is a 50 y.o. male patient admitted with depression, polysubstance abuse and status post suicidal attempt.  HPI: Benjamin [name] is a 50 y.o. male seen, chart reviewed and for face-to-face psychiatric consultation and evaluation. Patient has been suffering with increased symptoms of depression since he lost his mother in 23-Mar-2015 for lung cancer in [place] and then lost grandmother for unspecified medical problems in January 2017 in [place]. Patient has been busy first few months taking care of mothers bills, and payments etc. Patient stated he was offered hospice care counseling services for bereavement but patient stated he does not  required at that time so gently refused. Patient later within month found himself feeling depressed, sad, missing his mother and could not focus on his work and thought about moving out of mom's home to new rental place with the help of his girlfriend who has planning to come to [place], [place] from [place]. Patient initially started self-medicating with alcohol which was progressed to marijuana and also cocaine. Patient reported last week he was taken time off time from work and has been involved with more and more drug use. Patient reported he has one previous episode of self-injurious behavior on his left forearm required sutures in hospital in 07/24/2015. Patient reported he did lie to the physicians at that time because he does not want to be lable at psychiatric patient. Patient feels his cousin, his younger brother and girlfriend will be supportive to him. Patient stated he does not know why he become depressed and trying to kill himself. Patient blood alcohol is not significant and his urine drug screen is positive for opiates, cocaine, benzodiazepines and tetrahydrocannabinol.  Please review the following information for more detailsedical history significant for definitive chronic medical problems except for tobacco abuse and erectile dysfunction. Patient moved to [facility] 3 years ago to care for his mother who recently died in 03-23-15 secondary to complications from lung cancer. Since her death he has been suffering with profound depression. Since that time he began drinking socially and in the past 1 month escalated his alcohol intake to at least two 40 ounce beers per day as well as 1-2 shots of vodka per day. For the past month he began using illegal drugs including marijuana and daily use of cocaine/crack cocaine. He reports an unintentional weight loss of  10 pounds in the past month. He initially sought medical care in April 29 in the ER after reporting an accidental  laceration to the left arm. This was a factitious report and as of today patient has admitted that this was his initial suicide attempt. Since that time patient performed another self-inflicted deep laceration to the right forearm that is 21 cm in length, straight/linear and extends into the deep muscle and tendon layer. He reports he utilized a Clinical biochemist. He is unclear as to exactly when he performed this act because he has been drinking so heavily and has been utilizing other mind altering substances but he thinks injuries likely occurred about 2 days ago. Because of increased pain he presented to the ER today for treatment. As of today he has been more forthright with his medical history and admits that both lacerations were self-inflicted as a suicide attempt.  Past Psychiatric History: patient has no previous history of acute psychiatric hospitalization or outpatient medication management. Patient given diffuse hospice counseling services after death of his mother thinking his been strong enough and manages himself.   Risk to Self: Is patient at risk for suicide?: Yes Risk to Others:   Prior Inpatient Therapy:   Prior Outpatient Therapy:    Past Medical History:  Past Medical History  Diagnosis Date  . Erectile dysfunction   . Chronic back pain     managed [facility]    Past Surgical History  Procedure Laterality Date  . Ankle surgery      right  . Hand surgery      fracture, ORIG, teenage years, right.  . Lumbar epidural injection      [facility]  . Tendon repair N/A 07/24/2015    Procedure: WRIST LACERATION AND TENDON REPAIR;  Surgeon: Iran Planas, MD;  Location: [facility];  Service: Orthopedics;  Laterality: N/A;   Family History:  Family History  Problem Relation Age of Onset  . Cancer Mother     lung  . Cancer Father     throat  . Diabetes Paternal Grandmother   . Heart disease Neg Hx   . Stroke Neg Hx    Family Psychiatric  History: Denied  and reportedly patient mother suffered with drinking alcohol and smoking marijuana to deal with her stress from the cancer.  Social History:  History  Alcohol Use  . Yes    Comment: occasional     History  Drug Use  . Yes  . Special: Marijuana, Cocaine    Comment: last use cocaine 08/08/15, last use MJ 08/08/15    Social History   Social History  . Marital Status: Single    Spouse Name: N/A  . Number of Children: N/A  . Years of Education: N/A   Social History Main Topics  . Smoking status: Current Every Day Smoker -- 0.50 packs/day for 6 years    Types: Cigarettes  . Smokeless tobacco: None  . Alcohol Use: Yes     Comment: occasional  . Drug Use: Yes    Special: Marijuana, Cocaine     Comment: last use cocaine 08/08/15, last use MJ 08/08/15  . Sexual Activity: Not Asked   Other Topics Concern  . 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   No children   Additional Social History:    Allergies:  No Known Allergies  Labs:  Results for orders placed or  performed during the hospital encounter of 08/10/15 (from the past 48 hour(s))  CBC with Differential/Platelet     Status: Abnormal   Collection Time: 08/10/15 10:47 AM  Result Value Ref Range   WBC 5.4 4.0 - 10.5 K/uL   RBC 4.57 4.22 - 5.81 MIL/uL   Hemoglobin 11.8 (L) 13.0 - 17.0 g/dL   HCT 34.9 (L) 39.0 - 52.0 %   MCV 76.4 (L) 78.0 - 100.0 fL   MCH 25.8 (L) 26.0 - 34.0 pg   MCHC 33.8 30.0 - 36.0 g/dL   RDW 14.3 11.5 - 15.5 %   Platelets 182 150 - 400 K/uL   Neutrophils Relative % 68 %   Neutro Abs 3.7 1.7 - 7.7 K/uL   Lymphocytes Relative 22 %   Lymphs Abs 1.2 0.7 - 4.0 K/uL   Monocytes Relative 9 %   Monocytes Absolute 0.5 0.1 - 1.0 K/uL   Eosinophils Relative 1 %   Eosinophils Absolute 0.0 0.0 - 0.7 K/uL   Basophils Relative 0 %   Basophils Absolute 0.0 0.0 - 0.1 K/uL  Basic metabolic panel     Status: Abnormal   Collection Time:  08/10/15 10:47 AM  Result Value Ref Range   Sodium 136 135 - 145 mmol/L   Potassium 3.6 3.5 - 5.1 mmol/L   Chloride 104 101 - 111 mmol/L   CO2 25 22 - 32 mmol/L   Glucose, Bld 98 65 - 99 mg/dL   BUN 21 (H) 6 - 20 mg/dL   Creatinine, Ser 1.64 (H) 0.61 - 1.24 mg/dL   Calcium 9.1 8.9 - 10.3 mg/dL   GFR calc non Af Amer 48 (L) >60 mL/min   GFR calc Af Amer 56 (L) >60 mL/min    Comment: (NOTE) The eGFR has been calculated using the CKD EPI equation. This calculation has not been validated in all clinical situations. eGFR's persistently <60 mL/min signify possible Chronic Kidney Disease.    Anion gap 7 5 - 15  Ethanol     Status: None   Collection Time: 08/10/15 12:05 PM  Result Value Ref Range   Alcohol, Ethyl (B) <5 <5 mg/dL    Comment:        LOWEST DETECTABLE LIMIT FOR SERUM ALCOHOL IS 5 mg/dL FOR MEDICAL PURPOSES ONLY   Salicylate level     Status: None   Collection Time: 08/10/15 12:05 PM  Result Value Ref Range   Salicylate Lvl <3.1 2.8 - 30.0 mg/dL  Acetaminophen level     Status: Abnormal   Collection Time: 08/10/15 12:05 PM  Result Value Ref Range   Acetaminophen (Tylenol), Serum <10 (L) 10 - 30 ug/mL    Comment:        THERAPEUTIC CONCENTRATIONS VARY SIGNIFICANTLY. A RANGE OF 10-30 ug/mL MAY BE AN EFFECTIVE CONCENTRATION FOR MANY PATIENTS. HOWEVER, SOME ARE BEST TREATED AT CONCENTRATIONS OUTSIDE THIS RANGE. ACETAMINOPHEN CONCENTRATIONS >150 ug/mL AT 4 HOURS AFTER INGESTION AND >50 ug/mL AT 12 HOURS AFTER INGESTION ARE OFTEN ASSOCIATED WITH TOXIC REACTIONS.   Urine rapid drug screen (hosp performed)     Status: Abnormal   Collection Time: 08/10/15  5:19 PM  Result Value Ref Range   Opiates POSITIVE (A) NONE DETECTED   Cocaine POSITIVE (A) NONE DETECTED   Benzodiazepines POSITIVE (A) NONE DETECTED   Amphetamines NONE DETECTED NONE DETECTED   Tetrahydrocannabinol POSITIVE (A) NONE DETECTED   Barbiturates NONE DETECTED NONE DETECTED    Comment:         DRUG SCREEN  FOR MEDICAL PURPOSES ONLY.  IF CONFIRMATION IS NEEDED FOR ANY PURPOSE, NOTIFY LAB WITHIN 5 DAYS.        LOWEST DETECTABLE LIMITS FOR URINE DRUG SCREEN Drug Class       Cutoff (ng/mL) Amphetamine      1000 Barbiturate      200 Benzodiazepine   092 Tricyclics       957 Opiates          300 Cocaine          300 THC              50   Phosphorus     Status: None   Collection Time: 08/10/15  5:29 PM  Result Value Ref Range   Phosphorus 4.1 2.5 - 4.6 mg/dL  Magnesium     Status: None   Collection Time: 08/10/15  5:29 PM  Result Value Ref Range   Magnesium 2.3 1.7 - 2.4 mg/dL  MRSA PCR Screening     Status: None   Collection Time: 08/10/15 11:30 PM  Result Value Ref Range   MRSA by PCR NEGATIVE NEGATIVE    Comment:        The GeneXpert MRSA Assay (FDA approved for NASAL specimens only), is one component of a comprehensive MRSA colonization surveillance program. It is not intended to diagnose MRSA infection nor to guide or monitor treatment for MRSA infections.   TSH     Status: None   Collection Time: 08/10/15 11:47 PM  Result Value Ref Range   TSH 2.310 0.350 - 4.500 uIU/mL  Comprehensive metabolic panel     Status: Abnormal   Collection Time: 08/11/15  4:36 AM  Result Value Ref Range   Sodium 140 135 - 145 mmol/L   Potassium 4.0 3.5 - 5.1 mmol/L   Chloride 107 101 - 111 mmol/L   CO2 22 22 - 32 mmol/L   Glucose, Bld 62 (L) 65 - 99 mg/dL   BUN 17 6 - 20 mg/dL   Creatinine, Ser 1.63 (H) 0.61 - 1.24 mg/dL   Calcium 8.4 (L) 8.9 - 10.3 mg/dL   Total Protein 5.8 (L) 6.5 - 8.1 g/dL   Albumin 3.9 3.5 - 5.0 g/dL   AST 31 15 - 41 U/L   ALT 23 17 - 63 U/L   Alkaline Phosphatase 27 (L) 38 - 126 U/L   Total Bilirubin 2.4 (H) 0.3 - 1.2 mg/dL   GFR calc non Af Amer 49 (L) >60 mL/min   GFR calc Af Amer 56 (L) >60 mL/min    Comment: (NOTE) The eGFR has been calculated using the CKD EPI equation. This calculation has not been validated in all clinical  situations. eGFR's persistently <60 mL/min signify possible Chronic Kidney Disease.    Anion gap 11 5 - 15  CBC     Status: Abnormal   Collection Time: 08/11/15  4:36 AM  Result Value Ref Range   WBC 9.6 4.0 - 10.5 K/uL   RBC 4.23 4.22 - 5.81 MIL/uL   Hemoglobin 10.6 (L) 13.0 - 17.0 g/dL   HCT 33.1 (L) 39.0 - 52.0 %   MCV 78.3 78.0 - 100.0 fL   MCH 25.1 (L) 26.0 - 34.0 pg   MCHC 32.0 30.0 - 36.0 g/dL   RDW 14.8 11.5 - 15.5 %   Platelets 191 150 - 400 K/uL    Current Facility-Administered Medications  Medication Dose Route Frequency Provider Last Rate Last Dose  . 0.9 %  sodium chloride infusion  Intravenous Continuous Samella Parr, NP 75 mL/hr at 08/10/15 2356    . acetaminophen (TYLENOL) tablet 650 mg  650 mg Oral Q6H PRN Samella Parr, NP       Or  . acetaminophen (TYLENOL) suppository 650 mg  650 mg Rectal Q6H PRN Samella Parr, NP      . bisacodyl (DULCOLAX) suppository 10 mg  10 mg Rectal Daily PRN Samella Parr, NP      . docusate sodium (COLACE) capsule 100 mg  100 mg Oral BID Samella Parr, NP   100 mg at 08/11/15 0845  . feeding supplement (BOOST / RESOURCE BREEZE) liquid 1 Container  1 Container Oral TID BM Samella Parr, NP   1 Container at 08/11/15 [phone]  . feeding supplement (ENSURE ENLIVE) (ENSURE ENLIVE) liquid 237 mL  237 mL Oral BID BM Samella Parr, NP   237 mL at 08/11/15 0849  . folic acid injection 1 mg  1 mg Intravenous Daily Samella Parr, NP   1 mg at 08/11/15 0846  . LORazepam (ATIVAN) injection 2-3 mg  2-3 mg Intravenous Q1H PRN Samella Parr, NP      . methocarbamol (ROBAXIN) 500 mg in dextrose 5 % 50 mL IVPB  500 mg Intravenous Q6H PRN Avelina Laine, PA-C      . methocarbamol (ROBAXIN) tablet 500 mg  500 mg Oral Q6H PRN Avelina Laine, PA-C   500 mg at 08/11/15 0857  . morphine 2 MG/ML injection 1-4 mg  1-4 mg Intravenous Q2H PRN Samella Parr, NP   4 mg at 08/11/15 0505  . oxyCODONE (Oxy IR/ROXICODONE) immediate release tablet 10 mg   10 mg Oral Q4H PRN Modena Jansky, MD   10 mg at 08/11/15 0845  . piperacillin-tazobactam (ZOSYN) IVPB 3.375 g  3.375 g Intravenous Q8H Modena Jansky, MD   3.375 g at 08/11/15 1478  . polyethylene glycol (MIRALAX / GLYCOLAX) packet 17 g  17 g Oral Daily PRN Samella Parr, NP      . sodium chloride flush (NS) 0.9 % injection 3 mL  3 mL Intravenous Q12H Samella Parr, NP   3 mL at 08/10/15 2315  . thiamine (B-1) injection 100 mg  100 mg Intravenous Daily Samella Parr, NP   100 mg at 08/11/15 0846  . vancomycin (VANCOCIN) IVPB 1000 mg/200 mL premix  1,000 mg Intravenous Q12H Modena Jansky, MD   1,000 mg at 08/11/15 2956    Musculoskeletal: Strength & Muscle Tone: decreased Gait & Station: unable to stand Patient leans: N/A  Psychiatric Specialty Exam: ROS depression, anxiety, behavior management and polysubstance abuse denied nausea, vomiting, abdominal pain, shortness of breath and chest pain No Fever-chills, No Headache, No changes with Vision or hearing, reports vertigo No problems swallowing food or Liquids, No Chest pain, Cough or Shortness of Breath, No Abdominal pain, No Nausea or Vommitting, Bowel movements are regular, No Blood in stool or Urine, No dysuria, No new skin rashes or bruises, No new joints pains-aches,  No new weakness, tingling, numbness in any extremity, No recent weight gain or loss, No polyuria, polydypsia or polyphagia,   A full 10 point Review of Systems was done, except as stated above, all other Review of Systems were negative.  Blood pressure 120/69, pulse 55, temperature 98.2 F (36.8 C), temperature source Oral, resp. rate 12, height 6' 2"  (1.88 m), weight 86.2 kg (190 lb 0.6 oz), SpO2 95 %.Body mass index is  24.39 kg/(m^2).  General Appearance: Guarded  Eye Contact::  Good  Speech:  Clear and Coherent  Volume:  Normal  Mood:  Depressed  Affect:  Constricted and Depressed  Thought Process:  Coherent and Goal Directed  Orientation:   Full (Time, Place, and Person)  Thought Content:  WDL  Suicidal Thoughts:  Yes.  with intent/plan  Homicidal Thoughts:  No  Memory:  Immediate;   Good Recent;   Fair Remote;   Fair  Judgement:  Impaired  Insight:  Shallow  Psychomotor Activity:  Decreased  Concentration:  Fair  Recall:  Good  Fund of Knowledge:Good  Language: Good  Akathisia:  Negative  Handed:  Right  AIMS (if indicated):     Assets:  Communication Skills Desire for Improvement Financial Resources/Insurance Housing Intimacy Leisure Time Physical Health Resilience Social Support Talents/Skills Transportation Vocational/Educational  ADL's:  Impaired  Cognition: WNL  Sleep:      Treatment Plan Summary: Daily contact with patient to assess and evaluate symptoms and progress in treatment and Medication management   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   Disposition: Recommend psychiatric Inpatient admission when medically cleared. Supportive therapy provided about ongoing stressors.  Durward Parcel., MD 08/11/2015 10:41 AM

## 2015-08-11 NOTE — Progress Notes (Signed)
Pharmacy Antibiotic Note  Benjamin HaagensenJohn [name] is a 50 y.o. male admitted on 08/10/2015 with R arm laceration s/p I & D.  Pharmacy has been consulted for Vancomycin and Zosyn dosing x 48 h.  Plan: Vancomycin 1 g IV q12h  Zosyn 3.375 g IV q8h   Height: 6\' 2"  (188 cm) Weight: 190 lb 0.6 oz (86.2 kg) IBW/kg (Calculated) : 82.2  Temp (24hrs), Avg:97.8 F (36.6 C), Min:97.5 F (36.4 C), Max:98.2 F (36.8 C)   Recent Labs Lab 08/10/15 1047 08/11/15 0436  WBC 5.4 9.6  CREATININE 1.64* 1.63*    Estimated Creatinine Clearance: 65.1 mL/min (by C-G formula based on Cr of 1.63).    No Known Allergies   Benjamin [name], Benjamin [name] 08/11/2015 7:56 AM

## 2015-08-11 NOTE — Progress Notes (Signed)
Subjective: 1 Day Post-Op Procedure(s) (LRB): IRRIGATION AND DEBRIDEMENT SKIN, SUBCUTANEOS TISSUE AND  MUSCLE, CARPAL TUNNEL RELEASE, MEDIAN NERVE LYSIS WITH NERVE WRAP (Right) Patient reports pain as fairly controlled at this point. He has associated pain about the right upper extremity has expects. I have discussed with him his intraoperative findings and the significant scar tissue that was present as well as the median nerve lesions. I have once again questioned him in regards to a prior surgery to the right upper extremity and in fact he states he forgot to tell us he had a major reconstruction of a both bone wrist fracture several years ago which would certainly account for his interoperative findings. He denies nausea vomiting fever or chills. Sitter is in the room  Objective: Vital signs in last 24 hours: Temp:  [97.5 F (36.4 C)-98.2 F (36.8 C)] 98.2 F (36.8 C) (05/17 0745) Pulse Rate:  [50-71] 58 (05/17 0400) Resp:  [9-22] 9 (05/17 0400) BP: (108-182)/(62-119) 137/77 mmHg (05/17 0400) SpO2:  [96 %-100 %] 97 % (05/17 0400) Weight:  [83.915 kg (185 lb)-86.2 kg (190 lb 0.6 oz)] 86.2 kg (190 lb 0.6 oz) (05/16 2323)  Intake/Output from previous day: 05/16 0701 - 05/17 0700 In: 1405 [I.V.:1405] Out: 395 [Urine:375; Blood:20] Intake/Output this shift: Total I/O In: -  Out: 450 [Urine:450]   Recent Labs  08/10/15 1047 08/11/15 0436  HGB 11.8* 10.6*    Recent Labs  08/10/15 1047 08/11/15 0436  WBC 5.4 9.6  RBC 4.57 4.23  HCT 34.9* 33.1*  PLT 182 191    Recent Labs  08/10/15 1047 08/11/15 0436  NA 136 140  K 3.6 4.0  CL 104 107  CO2 25 22  BUN 21* 17  CREATININE 1.64* 1.63*  GLUCOSE 98 62*  CALCIUM 9.1 8.4*   No results for input(s): LABPT, INR in the last 72 hours.  Examination Patient is pleasant he is conversant Head is atraumatic normocephalic Chest equal expansions are present respirations are nonlabored Evaluation of the right upper extremity  shows that his splint is clean dry and intact. His drain is removed without difficulties. He does have diminished sensation about the median nerve distribution but has good early range of motion with flexion and extension about the digits of compartment syndrome dressings about the left wrist remain intact  Assessment/Plan: 1 Day Post-Op Procedure(s) (LRB): IRRIGATION AND DEBRIDEMENT SKIN, SUBCUTANEOS TISSUE AND  MUSCLE, CARPAL TUNNEL RELEASE, MEDIAN NERVE LYSIS WITH NERVE WRAP (Right) We have discussed with him all issues we would recommend a 48 hour period of IV antibiotics given the open wound and the delay in seeking treatment for over 2 days for the open laceration. The patient was able to have a primary closure and pending no complicating events should not need to return to the operative suite. We have encouraged frequent finger range of motion elevation and edema control. The patient is interested in seeking behavioral health options pending psychiatric evaluation.  Rjay Revolorio L 08/11/2015, 9:21 AM

## 2015-08-11 NOTE — Care Management Note (Signed)
Case Management Note  Patient Details  Name: Benjamin HaagensenJohn [name] MRN: [id number] Date of Birth: 08-05-1965  Subjective/Objective:    Patient from home alone, per psych eval meets critieria for inpt pysch.  Pysch to follow.                Action/Plan:   Expected Discharge Date:                  Expected Discharge Plan:  [facility]  In-House Referral:  Clinical Social Work  Discharge planning Services  CM Consult  Post Acute Care Choice:    Choice offered to:     DME Arranged:    DME Agency:     HH Arranged:    HH Agency:     Status of Service:  In process, will continue to follow  Medicare Important Message Given:    Date Medicare IM Given:    Medicare IM give by:    Date Additional Medicare IM Given:    Additional Medicare Important Message give by:     If discussed at Long Length of Stay Meetings, dates discussed:    Additional Comments:  Leone Havenaylor, Boyd Litaker Clinton, RN 08/11/2015, 5:06 PM
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
## 2015-08-11 NOTE — Progress Notes (Signed)
Initial Nutrition Assessment  DOCUMENTATION CODES:   Not applicable  INTERVENTION:    Strawberry Ensure Enlive PO BID, each supplement provides 350 kcal and 20 grams of protein  Multivitamin daily  NUTRITION DIAGNOSIS:   Increased nutrient needs related to wound healing as evidenced by estimated needs.  GOAL:   Patient will meet greater than or equal to 90% of their needs  MONITOR:   PO intake, Supplement acceptance, Skin, I & O's  REASON FOR ASSESSMENT:   Consult Assessment of nutrition requirement/status  ASSESSMENT:   50 y.o. male with a past medical history of chronic back pain, depression, multiple suicide attempts, and ETOH abuse, who presents with massive R arm laceration after suicide attempt.  Patient reports that he has been eating poorly for the past 3 months and has lost ~10-15 lbs. He states that his usual weight is ~190-195 lbs and he is currently 180 lbs. Per chart review, he weighed 200 lbs 3 months ago and is now 190 lbs. Patient with ~5% weight loss in the past 3 months. He attributes his weight loss and recent ETOH abuse to depression related to the recent passing of his mother. He has tried and Network engineerlikes strawberry Ensure, agreed to drink supplement BID. He reports good PO intake since hospital admission, consuming 100% of meals.   Diet Order:  Diet regular Room service appropriate?: Yes; Fluid consistency:: Thin  Skin:  Wound (see comment) (right arm laceration)  Last BM:  unknown  Height:   Ht Readings from Last 1 Encounters:  08/10/15 6\' 2"  (1.88 m)    Weight:   Wt Readings from Last 1 Encounters:  08/10/15 190 lb 0.6 oz (86.2 kg)    Ideal Body Weight:  86.4 kg  BMI:  Body mass index is 24.39 kg/(m^2).  Estimated Nutritional Needs:   Kcal:  2300-2500  Protein:  120-130 gm  Fluid:  2.3-2.5 L  EDUCATION NEEDS:   Education needs addressed  Joaquin CourtsKimberly Chyrl Elwell, RD, LDN, CNSC Pager [phone] After Hours Pager [phone]

## 2015-08-11 NOTE — Consult Note (Signed)
[facility] unable to visit with pt; pt with psych; ch will visit post psych eval. Erline LevineMichael I Decorey Wahlert 10:56 AM

## 2015-08-12 LAB — COMPREHENSIVE METABOLIC PANEL
ALBUMIN: 3.1 g/dL — AB (ref 3.5–5.0)
ALT: 20 U/L (ref 17–63)
ANION GAP: 10 (ref 5–15)
AST: 21 U/L (ref 15–41)
Alkaline Phosphatase: 19 U/L — ABNORMAL LOW (ref 38–126)
BILIRUBIN TOTAL: 1.4 mg/dL — AB (ref 0.3–1.2)
BUN: 18 mg/dL (ref 6–20)
CO2: 26 mmol/L (ref 22–32)
Calcium: 8.2 mg/dL — ABNORMAL LOW (ref 8.9–10.3)
Chloride: 105 mmol/L (ref 101–111)
Creatinine, Ser: 1.74 mg/dL — ABNORMAL HIGH (ref 0.61–1.24)
GFR calc Af Amer: 52 mL/min — ABNORMAL LOW (ref >60)
GFR calc non Af Amer: 45 mL/min — ABNORMAL LOW (ref >60)
GLUCOSE: 102 mg/dL — AB (ref 65–99)
POTASSIUM: 4.1 mmol/L (ref 3.5–5.1)
SODIUM: 141 mmol/L (ref 135–145)
TOTAL PROTEIN: 4.7 g/dL — AB (ref 6.5–8.1)

## 2015-08-12 LAB — CBC
HEMATOCRIT: 29.1 % — AB (ref 39.0–52.0)
Hemoglobin: 9.3 g/dL — ABNORMAL LOW (ref 13.0–17.0)
MCH: 24.8 pg — ABNORMAL LOW (ref 26.0–34.0)
MCHC: 32 g/dL (ref 30.0–36.0)
MCV: 77.6 fL — ABNORMAL LOW (ref 78.0–100.0)
Platelets: 149 10*3/uL — ABNORMAL LOW (ref 150–400)
RBC: 3.75 MIL/uL — ABNORMAL LOW (ref 4.22–5.81)
RDW: 14.3 % (ref 11.5–15.5)
WBC: 6.2 10*3/uL (ref 4.0–10.5)

## 2015-08-12 LAB — GLUCOSE, CAPILLARY: Glucose-Capillary: 89 mg/dL (ref 65–99)

## 2015-08-12 MED ORDER — SODIUM CHLORIDE 0.9 % IV SOLN
INTRAVENOUS | Status: AC
  Administered 2015-08-12: 23:00:00 via INTRAVENOUS

## 2015-08-12 NOTE — Progress Notes (Signed)
Report called pt to transfer to 5N16 via w/c with belongings and sitter.

## 2015-08-12 NOTE — Progress Notes (Signed)
PROGRESS NOTE  Benjamin HaagensenJohn [name]  ZOX:[id number] DOB: September 29, 1965  DOA: 08/10/2015 PCP: Ernst BreachYSINGER, DAVID SHANE, PA-C   Brief Narrative:  50 year old male with history of polysubstance abuse (tobacco, alcohol, THC, cocaine, crack cocaine), depression-worsened following death of his mother in December 2016 from lung cancer, attempted initial suicide by self-inflicted left arm laceration on 4/29 but did not admit suicidal intent at that time, again attempted suicide by another self-inflicted deep laceration to right forearm using a razor-unknown duration, [facility] because of worsening pain. Orthopedic/hand surgery was consulted and patient was taken to the OR for I&D/extensive surgery. Psychiatric consulted.   Assessment & Plan:   Principal Problem:   Suicide attempt [facility](HCC) Active Problems:   Laceration of right arm with complication   Uncontrolled hypertension   Alcohol abuse   Cocaine abuse   Dehydration   Weight loss, non-intentional   Depression   Multiple suicide attempts/depression - Patient admitted to at least 2 suicide attempts by lacerating his forearms since that of his mother in December 2016. - Continue suicide precautions and Recruitment consultantsafety sitter. - Psychiatry consulted: Started Cymbalta and recommend inpatient psychiatry admission when medically stable. Patient expected to DC 5/19.  Laceration of right arm - hand surgery was consulted and patient underwent extensive I&D in OR on 5/16.  - Orthopedic follow-up appreciated: Recommend continuing IV antibiotics through today then transitioning to oral Cipro 500 MG twice a day 14 days and outpatient follow-up with them in the office at the end of next week.  Polysubstance abuse (alcohol, tobacco, crack/cocaine & THC) - Cessation counseled. Continue CIWA protocol and nicotine patch. No features of alcohol withdrawal.  Unintentional weight loss - May be related to depression, substance abuse and poor oral intake. Diet as  tolerated. Outpatient follow-up as deemed necessary.  Dehydration - Resolved.  Stage III chronic kidney disease - Creatinine seems to have slightly increased today. Brief IV fluids and follow up BMP.  Acute postop blood loss anemia - Follow CBCs closely and transfuse if hemoglobin less than 7 g per DL.  Mild thrombocytopenia - ? Secondary to acute infection versus blood loss. Follow CBC in a.m.  Hypoglycemia - Blood sugar on 5/17 a.m. labs: 62. Monitored CBGs > No further hypoglycemic episodes, DC'ed CBG's.   DVT prophylaxis: SCD's Code Status: Full Family Communication: Discussed at length with patient. No family at bedside. Disposition Plan: Patient was admitted to stepdown unit postop. Transfer to medical bed on 5/18. [facility].  Consultants:    Orthopedic/hand surgery  Procedures:    I&D of right arm laceration in OR on 5/16  Antimicrobials:    IV vancomycin  IV Zosyn    Subjective: Appropriate right arm postop pain. Denies suicidal or homicidal ideations. As per RN, no acute issues reported.  Objective:  Filed Vitals:   08/12/15 0747 08/12/15 0800 08/12/15 1100 08/12/15 1200  BP: 121/69 119/66 125/89 122/71  Pulse: 56 55 60   Temp: 97.9 F (36.6 C)  98 F (36.7 C)   TempSrc: Oral  Oral   Resp: 12 12 11 16   Height:      Weight:      SpO2: 99% 99% 98%     Intake/Output Summary (Last 24 hours) at 08/12/15 1421 Last data filed at 08/12/15 1200  Gross per 24 hour  Intake 2693.42 ml  Output    650 ml  Net 2043.42 ml   Filed Weights   08/10/15 0930 08/10/15 2323  Weight: 83.915 kg (185  lb) 86.2 kg (190 lb 0.6 oz)    Examination:  General exam: Pleasant young male sitting up comfortably in bed this morning eating breakfast.Safety sitter at bedside. Respiratory system: Clear to auscultation. Respiratory effort normal. Cardiovascular system: S1 & S2 heard, RRR. No JVD, murmurs, rubs, gallops or clicks. No pedal edema. Telemetry: SB in  the 50s-SR  Gastrointestinal system: Abdomen is nondistended, soft and nontender. No organomegaly or masses felt. Normal bowel sounds heard. Central nervous system: Alert and oriented. No focal neurological deficits. Extremities: Symmetric 5 x 5 power. Right arm postop dressing clean and dry. Able to move tip of his fingers well. Left wrist dressing clean and dry. Skin: No rashes, lesions or ulcers Psychiatry: Judgement and insight appear normal. Mood & affect appropriate.     Data Reviewed: I have personally reviewed following labs and imaging studies  CBC:  Recent Labs Lab 08/10/15 1047 08/11/15 0436 08/12/15 0553  WBC 5.4 9.6 6.2  NEUTROABS 3.7  --   --   HGB 11.8* 10.6* 9.3*  HCT 34.9* 33.1* 29.1*  MCV 76.4* 78.3 77.6*  PLT 182 191 149*   Basic Metabolic Panel:  Recent Labs Lab 08/10/15 1047 08/10/15 1729 08/11/15 0436 08/12/15 0553  NA 136  --  140 141  K 3.6  --  4.0 4.1  CL 104  --  107 105  CO2 25  --  22 26  GLUCOSE 98  --  62* 102*  BUN 21*  --  17 18  CREATININE 1.64*  --  1.63* 1.74*  CALCIUM 9.1  --  8.4* 8.2*  MG  --  2.3  --   --   PHOS  --  4.1  --   --    GFR: Estimated Creatinine Clearance: 61 mL/min (by C-G formula based on Cr of 1.74). Liver Function Tests:  Recent Labs Lab 08/11/15 0436 08/12/15 0553  AST 31 21  ALT 23 20  ALKPHOS 27* 19*  BILITOT 2.4* 1.4*  PROT 5.8* 4.7*  ALBUMIN 3.9 3.1*   No results for input(s): LIPASE, AMYLASE in the last 168 hours. No results for input(s): AMMONIA in the last 168 hours. Coagulation Profile: No results for input(s): INR, PROTIME in the last 168 hours. Cardiac Enzymes: No results for input(s): CKTOTAL, CKMB, CKMBINDEX, TROPONINI in the last 168 hours. BNP (last 3 results) No results for input(s): PROBNP in the last 8760 hours. HbA1C: No results for input(s): HGBA1C in the last 72 hours. CBG:  Recent Labs Lab 08/11/15 1804 08/11/15 2109 08/12/15 0745  GLUCAP 112* 109* 89   Lipid  Profile: No results for input(s): CHOL, HDL, LDLCALC, TRIG, CHOLHDL, LDLDIRECT in the last 72 hours. Thyroid Function Tests:  Recent Labs  08/10/15 2347  TSH 2.310   Anemia Panel: No results for input(s): VITAMINB12, FOLATE, FERRITIN, TIBC, IRON, RETICCTPCT in the last 72 hours.  Sepsis Labs: No results for input(s): PROCALCITON, LATICACIDVEN in the last 168 hours.  Recent Results (from the past 240 hour(s))  Culture, blood (Routine X 2) w Reflex to ID Panel     Status: None (Preliminary result)   Collection Time: 08/10/15  5:15 PM  Result Value Ref Range Status   Specimen Description BLOOD LEFT HAND  Final   Special Requests IN PEDIATRIC BOTTLE 3CC  Final   Culture NO GROWTH 2 DAYS  Final   Report Status PENDING  Incomplete  Culture, blood (Routine X 2) w Reflex to ID Panel     Status: None (Preliminary result)  Collection Time: 08/10/15  5:20 PM  Result Value Ref Range Status   Specimen Description BLOOD LEFT FOREARM  Final   Special Requests BOTTLES DRAWN AEROBIC AND ANAEROBIC 5CC  Final   Culture NO GROWTH 2 DAYS  Final   Report Status PENDING  Incomplete  MRSA PCR Screening     Status: None   Collection Time: 08/10/15 11:30 PM  Result Value Ref Range Status   MRSA by PCR NEGATIVE NEGATIVE Final    Comment:        The GeneXpert MRSA Assay (FDA approved for NASAL specimens only), is one component of a comprehensive MRSA colonization surveillance program. It is not intended to diagnose MRSA infection nor to guide or monitor treatment for MRSA infections.          Radiology Studies: No results found.      Scheduled Meds: . docusate sodium  100 mg Oral BID  . DULoxetine  30 mg Oral Daily  . feeding supplement (ENSURE ENLIVE)  237 mL Oral BID BM  . folic acid  1 mg Oral Daily  . multivitamin with minerals  1 tablet Oral Daily  . piperacillin-tazobactam (ZOSYN)  IV  3.375 g Intravenous Q8H  . sodium chloride flush  3 mL Intravenous Q12H  . thiamine   100 mg Oral Daily  . traZODone  50 mg Oral QHS  . vancomycin  1,000 mg Intravenous Q12H   Continuous Infusions: . sodium chloride 75 mL/hr at 08/12/15 0819     LOS: 2 days    Time spent: 25 minutes.    [facility], MD [facility] Pager [phone]  If 7PM-7AM, please contact night-coverage [email or web address] Password [email or web address] 08/12/2015, 2:21 PM

## 2015-08-12 NOTE — Progress Notes (Signed)
Patient ID: Mady HaagensenJohn Faerber, male   DOB: 02-07-1966, 50 y.o.   MRN: [id number] Patient seen at bedside.  Patient has no comp caring features today.  I removed and replaced his left wrist bandage. The series clean dry without signs of infection.  The right upper extremity bandages left intact. His arm is soft without cellulitis or other problematic features.  I discussed him range of motion issues and edema control methods.  I would recommend IV antibiotics through today. Following this I feel he can be transferred to oral Cipro with follow-up in office at the end of next week. As mentioned I would recommend Cipro 500 mg twice a day 14 days.  He has no signs of infection but we need to keep a close eye on him.  We have discussed with patient all issues plans concerns and other avenues of treatment.  He'll be going to behavioral health after his suicide attempt.  Gerica Koble M.D.

## 2015-08-13 ENCOUNTER — Encounter (HOSPITAL_COMMUNITY): Payer: Self-pay | Admitting: *Deleted

## 2015-08-13 ENCOUNTER — Inpatient Hospital Stay (HOSPITAL_COMMUNITY)
Admission: AD | Admit: 2015-08-13 | Discharge: 2015-08-16 | DRG: 885 | Disposition: A | Discharge status: Other Acute Inpatient Hospital | Payer: BLUE CROSS/BLUE SHIELD | Source: Intra-hospital | Attending: Orthopedic Surgery | Admitting: Orthopedic Surgery

## 2015-08-13 DIAGNOSIS — F4321 Adjustment disorder with depressed mood: Secondary | ICD-10-CM | POA: Diagnosis not present

## 2015-08-13 DIAGNOSIS — G4700 Insomnia, unspecified: Secondary | ICD-10-CM | POA: Diagnosis present

## 2015-08-13 DIAGNOSIS — F332 Major depressive disorder, recurrent severe without psychotic features: Secondary | ICD-10-CM | POA: Clinically undetermined

## 2015-08-13 DIAGNOSIS — K5900 Constipation, unspecified: Secondary | ICD-10-CM | POA: Diagnosis present

## 2015-08-13 DIAGNOSIS — F17210 Nicotine dependence, cigarettes, uncomplicated: Secondary | ICD-10-CM | POA: Diagnosis present

## 2015-08-13 DIAGNOSIS — F1410 Cocaine abuse, uncomplicated: Secondary | ICD-10-CM | POA: Diagnosis present

## 2015-08-13 DIAGNOSIS — F1210 Cannabis abuse, uncomplicated: Secondary | ICD-10-CM | POA: Diagnosis present

## 2015-08-13 DIAGNOSIS — F1910 Other psychoactive substance abuse, uncomplicated: Secondary | ICD-10-CM | POA: Diagnosis not present

## 2015-08-13 DIAGNOSIS — R45851 Suicidal ideations: Secondary | ICD-10-CM | POA: Diagnosis present

## 2015-08-13 DIAGNOSIS — S41111A Laceration without foreign body of right upper arm, initial encounter: Secondary | ICD-10-CM | POA: Diagnosis present

## 2015-08-13 DIAGNOSIS — E860 Dehydration: Secondary | ICD-10-CM | POA: Diagnosis not present

## 2015-08-13 DIAGNOSIS — F1010 Alcohol abuse, uncomplicated: Secondary | ICD-10-CM | POA: Diagnosis not present

## 2015-08-13 DIAGNOSIS — I10 Essential (primary) hypertension: Secondary | ICD-10-CM | POA: Diagnosis present

## 2015-08-13 DIAGNOSIS — F322 Major depressive disorder, single episode, severe without psychotic features: Secondary | ICD-10-CM | POA: Diagnosis present

## 2015-08-13 DIAGNOSIS — F10239 Alcohol dependence with withdrawal, unspecified: Secondary | ICD-10-CM | POA: Diagnosis present

## 2015-08-13 DIAGNOSIS — X781XXA Intentional self-harm by knife, initial encounter: Secondary | ICD-10-CM | POA: Diagnosis present

## 2015-08-13 DIAGNOSIS — S51811A Laceration without foreign body of right forearm, initial encounter: Secondary | ICD-10-CM | POA: Diagnosis present

## 2015-08-13 DIAGNOSIS — S5011XA Contusion of right forearm, initial encounter: Secondary | ICD-10-CM | POA: Diagnosis present

## 2015-08-13 DIAGNOSIS — T1491 Suicide attempt: Secondary | ICD-10-CM | POA: Diagnosis not present

## 2015-08-13 DIAGNOSIS — F329 Major depressive disorder, single episode, unspecified: Secondary | ICD-10-CM | POA: Diagnosis not present

## 2015-08-13 DIAGNOSIS — F1110 Opioid abuse, uncomplicated: Secondary | ICD-10-CM | POA: Diagnosis present

## 2015-08-13 DIAGNOSIS — M79A11 Nontraumatic compartment syndrome of right upper extremity: Secondary | ICD-10-CM

## 2015-08-13 DIAGNOSIS — T1491XA Suicide attempt, initial encounter: Secondary | ICD-10-CM | POA: Diagnosis present

## 2015-08-13 LAB — CBC
HCT: 29 % — ABNORMAL LOW (ref 39.0–52.0)
HEMOGLOBIN: 9.2 g/dL — AB (ref 13.0–17.0)
MCH: 24.8 pg — AB (ref 26.0–34.0)
MCHC: 31.7 g/dL (ref 30.0–36.0)
MCV: 78.2 fL (ref 78.0–100.0)
Platelets: 160 10*3/uL (ref 150–400)
RBC: 3.71 MIL/uL — ABNORMAL LOW (ref 4.22–5.81)
RDW: 14.7 % (ref 11.5–15.5)
WBC: 4.1 10*3/uL (ref 4.0–10.5)

## 2015-08-13 LAB — BASIC METABOLIC PANEL
Anion gap: 5 (ref 5–15)
BUN: 15 mg/dL (ref 6–20)
CHLORIDE: 106 mmol/L (ref 101–111)
CO2: 29 mmol/L (ref 22–32)
CREATININE: 1.71 mg/dL — AB (ref 0.61–1.24)
Calcium: 8.4 mg/dL — ABNORMAL LOW (ref 8.9–10.3)
GFR calc Af Amer: 53 mL/min — ABNORMAL LOW (ref >60)
GFR calc non Af Amer: 46 mL/min — ABNORMAL LOW (ref >60)
GLUCOSE: 99 mg/dL (ref 65–99)
Potassium: 3.9 mmol/L (ref 3.5–5.1)
SODIUM: 140 mmol/L (ref 135–145)

## 2015-08-13 MED ORDER — DOCUSATE SODIUM 100 MG PO CAPS
100.0000 mg | ORAL_CAPSULE | Freq: Two times a day (BID) | ORAL | Status: DC
  Administered 2015-08-13 – 2015-08-15 (×4): 100 mg via ORAL
  Filled 2015-08-13 (×9): qty 1

## 2015-08-13 MED ORDER — ALUM & MAG HYDROXIDE-SIMETH 200-200-20 MG/5ML PO SUSP: 30.0000 mL | ORAL | Status: DC | PRN

## 2015-08-13 MED ORDER — FOLIC ACID 1 MG PO TABS: 1.0000 mg | ORAL_TABLET | Freq: Every day | ORAL | Status: DC

## 2015-08-13 MED ORDER — ACETAMINOPHEN 325 MG PO TABS: 650.0000 mg | ORAL_TABLET | Freq: Four times a day (QID) | ORAL | Status: DC | PRN

## 2015-08-13 MED ORDER — POLYETHYLENE GLYCOL 3350 17 G PO PACK
17.0000 g | PACK | Freq: Every day | ORAL | Status: DC | PRN
  Filled 2015-08-13: qty 1

## 2015-08-13 MED ORDER — ENSURE ENLIVE PO LIQD
237.0000 mL | Freq: Two times a day (BID) | ORAL | Status: DC
  Administered 2015-08-13 – 2015-08-15 (×5): 237 mL via ORAL

## 2015-08-13 MED ORDER — METHOCARBAMOL 500 MG PO TABS
500.0000 mg | ORAL_TABLET | Freq: Four times a day (QID) | ORAL | Status: DC | PRN
  Administered 2015-08-13: 500 mg via ORAL
  Filled 2015-08-13 (×2): qty 1

## 2015-08-13 MED ORDER — LORAZEPAM 1 MG PO TABS
1.0000 mg | ORAL_TABLET | Freq: Four times a day (QID) | ORAL | Status: DC | PRN
  Administered 2015-08-13 – 2015-08-14 (×2): 1 mg via ORAL
  Filled 2015-08-13 (×2): qty 1

## 2015-08-13 MED ORDER — METHOCARBAMOL 500 MG PO TABS: 500.0000 mg | ORAL_TABLET | Freq: Four times a day (QID) | ORAL | Status: DC | PRN

## 2015-08-13 MED ORDER — OXYCODONE HCL 5 MG PO TABS
10.0000 mg | ORAL_TABLET | ORAL | Status: DC | PRN
  Administered 2015-08-13 – 2015-08-15 (×10): 10 mg via ORAL
  Filled 2015-08-13 (×10): qty 2

## 2015-08-13 MED ORDER — TRAZODONE HCL 50 MG PO TABS: 50.0000 mg | ORAL_TABLET | Freq: Every day | ORAL | Status: DC

## 2015-08-13 MED ORDER — VITAMIN B-1 100 MG PO TABS
100.0000 mg | ORAL_TABLET | Freq: Every day | ORAL | Status: DC
  Administered 2015-08-14 – 2015-08-15 (×2): 100 mg via ORAL
  Filled 2015-08-13 (×4): qty 1

## 2015-08-13 MED ORDER — FOLIC ACID 1 MG PO TABS
1.0000 mg | ORAL_TABLET | Freq: Every day | ORAL | Status: DC
  Administered 2015-08-14 – 2015-08-15 (×2): 1 mg via ORAL
  Filled 2015-08-13 (×4): qty 1

## 2015-08-13 MED ORDER — BISACODYL 10 MG RE SUPP: 10.0000 mg | Freq: Every day | RECTAL | Status: DC | PRN

## 2015-08-13 MED ORDER — ADULT MULTIVITAMIN W/MINERALS CH
1.0000 | ORAL_TABLET | Freq: Every day | ORAL | Status: DC
  Administered 2015-08-14 – 2015-08-15 (×2): 1 via ORAL
  Filled 2015-08-13 (×4): qty 1

## 2015-08-13 MED ORDER — DULOXETINE HCL 30 MG PO CPEP: 30.0000 mg | ORAL_CAPSULE | Freq: Every day | ORAL | Status: DC

## 2015-08-13 MED ORDER — TRAZODONE HCL 50 MG PO TABS
50.0000 mg | ORAL_TABLET | Freq: Every day | ORAL | Status: DC
  Administered 2015-08-13 – 2015-08-14 (×2): 50 mg via ORAL
  Filled 2015-08-13 (×5): qty 1

## 2015-08-13 MED ORDER — ADULT MULTIVITAMIN W/MINERALS CH: 1.0000 | ORAL_TABLET | Freq: Every day | ORAL | Status: DC

## 2015-08-13 MED ORDER — GABAPENTIN 100 MG PO CAPS
100.0000 mg | ORAL_CAPSULE | Freq: Three times a day (TID) | ORAL | Status: DC
  Administered 2015-08-13 – 2015-08-15 (×6): 100 mg via ORAL
  Filled 2015-08-13 (×9): qty 1

## 2015-08-13 MED ORDER — THIAMINE HCL 100 MG PO TABS: 100.0000 mg | ORAL_TABLET | Freq: Every day | ORAL | Status: DC

## 2015-08-13 MED ORDER — MAGNESIUM HYDROXIDE 400 MG/5ML PO SUSP
30.0000 mL | Freq: Every day | ORAL | Status: DC | PRN
  Filled 2015-08-13: qty 30

## 2015-08-13 MED ORDER — CIPROFLOXACIN HCL 500 MG PO TABS: 500.0000 mg | ORAL_TABLET | Freq: Two times a day (BID) | ORAL | Status: DC

## 2015-08-13 MED ORDER — DULOXETINE HCL 30 MG PO CPEP
30.0000 mg | ORAL_CAPSULE | Freq: Every day | ORAL | Status: DC
  Administered 2015-08-14 – 2015-08-15 (×2): 30 mg via ORAL
  Filled 2015-08-13 (×4): qty 1

## 2015-08-13 NOTE — Progress Notes (Signed)
Psychoeducational Group Note  Date:  08/13/2015 Time:  2202  Group Topic/Focus:  Wrap-Up Group:   The focus of this group is to help patients review their daily goal of treatment and discuss progress on daily workbooks.  Participation Level: Did Not Attend  Participation Quality:  Not Applicable  Affect:  Not Applicable  Cognitive:  Not Applicable  Insight:  Not Applicable  Engagement in Group: Not Applicable  Additional Comments:  The patient did not attend the A.A. Meeting this evening as he slept in his bedroom.   Gloristine Turrubiates S 08/13/2015, 10:02 PM

## 2015-08-13 NOTE — Tx Team (Signed)
Initial Interdisciplinary Treatment Plan   PATIENT STRESSORS: Loss of mother and grandmother a month apart   PATIENT STRENGTHS: Ability for insight Average or above average intelligence Capable of independent living Communication skills Supportive family/friends   PROBLEM LIST: Problem List/Patient Goals Date to be addressed Date deferred Reason deferred Estimated date of resolution  At risk for suicide 08/13/2015  08/13/2015   D/C  Depression 08/13/2015  08/13/2015   D/C  Self-injury 08/13/2015  08/13/2015   D/C  Substance Abuse 08/13/2015  08/13/2015   D/C  "Get back to feeling good by myself" 08/13/2015  08/13/2015   D/C  "Know I have other options besides doing what I did" 08/13/2015  08/13/2015   D/C                     DISCHARGE CRITERIA:  Ability to meet basic life and health needs Improved stabilization in mood, thinking, and/or behavior Medical problems require only outpatient monitoring Motivation to continue treatment in a less acute level of care Need for constant or close observation no longer present Reduction of life-threatening or endangering symptoms to within safe limits Safe-care adequate arrangements made  PRELIMINARY DISCHARGE PLAN: Attend 12-step recovery group Outpatient therapy Return to previous living arrangement Return to previous work or school arrangements  PATIENT/FAMIILY INVOLVEMENT: This treatment plan has been presented to and reviewed with the patient, Benjamin [name].  The patient and family have been given the opportunity to ask questions and make suggestions.  Larry SierrasMiddleton, Stephon Weathers P 08/13/2015, 4:33 PM

## 2015-08-13 NOTE — Discharge Instructions (Addendum)
Incision and Drainage Incision and drainage is a procedure in which a sac-like structure (cystic structure) is opened and drained. The area to be drained usually contains material such as pus, fluid, or blood.  LET YOUR CAREGIVER KNOW ABOUT:   Allergies to medicine.  Medicines taken, including vitamins, herbs, eyedrops, over-the-counter medicines, and creams.  Use of steroids (by mouth or creams).  Previous problems with anesthetics or numbing medicines.  History of bleeding problems or blood clots.  Previous surgery.  Other health problems, including diabetes and kidney problems.  Possibility of pregnancy, if this applies. RISKS AND COMPLICATIONS  Pain.  Bleeding.  Scarring.  Infection. BEFORE THE PROCEDURE  You may need to have an ultrasound or other imaging tests to see how large or deep your cystic structure is. Blood tests may also be used to determine if you have an infection or how severe the infection is. You may need to have a tetanus shot. PROCEDURE  The affected area is cleaned with a cleaning fluid. The cyst area will then be numbed with a medicine (local anesthetic). A small incision will be made in the cystic structure. A syringe or catheter may be used to drain the contents of the cystic structure, or the contents may be squeezed out. The area will then be flushed with a cleansing solution. After cleansing the area, it is often gently packed with a gauze or another wound dressing. Once it is packed, it will be covered with gauze and tape or some other type of wound dressing. AFTER THE PROCEDURE   Often, you will be allowed to go home right after the procedure.  You may be given antibiotic medicine to prevent or heal an infection.  If the area was packed with gauze or some other wound dressing, you will likely need to come back in 1 to 2 days to get it removed.  The area should heal in about 14 days.   This information is not intended to replace advice given  to you by your health care provider. Make sure you discuss any questions you have with your health care provider.   Document Released: 09/06/2000 Document Revised: 09/12/2011 Document Reviewed: 05/08/2011 Elsevier Interactive Patient Education 2016 [employer].   Pain Medicine Instructions  HOW CAN PAIN MEDICINE AFFECT ME?    You were given a prescription for pain medicine. This medicine may make you tired or drowsy and may affect your ability to think clearly. Pain medicine may also affect your ability to drive or perform certain physical activities. It may not be possible to make all of your pain go away, but you should be comfortable enough to move, breathe, and take care of yourself.  HOW OFTEN SHOULD I TAKE PAIN MEDICINE AND HOW MUCH SHOULD I TAKE?  Take pain medicine only as directed by your health care provider and only as needed for pain.  You do not need to take pain medicine if you are not having pain, unless directed by your health care provider.  You can take less than the prescribed dose if you find that a smaller amount of medicine controls your pain. WHAT RESTRICTIONS DO I HAVE WHILE TAKING PAIN MEDICINE?  Follow these instructions after you start taking pain medicine, while you are taking the medicine, and for 8 hours after you stop taking the medicine:  Do not drive.  Do not operate machinery.  Do not operate power tools.  Do not sign legal documents.  Do not drink alcohol.  Do not take  sleeping pills.  Do not supervise children by yourself.  Do not participate in activities that require climbing or being in high places.  Do not enter a body of water--such as a lake, river, ocean, spa, or swimming pool--without an adult nearby who can monitor and help you. HOW CAN I KEEP OTHERS SAFE WHILE I AM TAKING PAIN MEDICINE?  Store your pain medicine as directed by your health care provider. Make sure that it is placed where children and pets cannot reach it.  Never share your  pain medicine with anyone.  Do not save any leftover pills. If you have any leftover pain medicine, get rid of it or destroy it as directed by your health care provider. WHAT ELSE DO I NEED TO KNOW ABOUT TAKING PAIN MEDICINE?  Use a stool softener if you become constipated from your pain medicine. Increasing your intake of fruits and vegetables will also help with constipation.  Write down the times when you take your pain medicine. Look at the times before you take your next dose of medicine. It is easy to become confused while on pain medicine. Recording the times helps you to avoid an overdose.  If your pain is severe, do not try to treat it yourself by taking more pills than instructed on your prescription. Contact your health care provider for help.  You may have been prescribed a pain medicine that contains acetaminophen. Do not take any other acetaminophen while taking this medicine. An overdose of acetaminophen can result in severe liver damage. Acetaminophen is found in many over-the-counter (OTC) and prescription medicines. If you are taking any medicines in addition to your pain medicine, check the active ingredients on those medicines to see if acetaminophen is listed. WHEN SHOULD I CALL MY HEALTH CARE PROVIDER?  Your medicine is not helping to make the pain go away.  You vomit or have diarrhea shortly after taking the medicine.  You develop new pain in areas that did not hurt before.  You have an allergic reaction to your medicine. This may include:  Itchiness.  Swelling.  Dizziness.  Developing a new rash. WHEN SHOULD I CALL 911 OR GO TO THE EMERGENCY ROOM?  You feel dizzy or you faint.  You are very confused or disoriented.  You repeatedly vomit.  Your skin or lips turn pale or bluish in color.  You have shortness of breath or you are breathing much more slowly than usual.  You have a severe allergic reaction to your medicine. This includes:  Developing tongue swelling.  Having  difficulty breathing. This information is not intended to replace advice given to you by your health care provider. Make sure you discuss any questions you have with your health care provider.  Document Released: 06/19/2000 Document Revised: 07/28/2014 Document Reviewed: 01/15/2014  Elsevier Interactive Patient Education [employer] Elsevier Inc.

## 2015-08-13 NOTE — Progress Notes (Signed)
Admission Note:  50 year old male who presents IVC, in no acute distress, for the treatment of SI and Depression.  Patient made self-inflicted lacerations to forearm with a blade as a suicide attempt.  Patient appears flat and depressed. Patient was calm and cooperative with admission process. Patient currently denies SI and contracts for safety upon admission.  Patient denies HI and AVH .  Patient reports stressors of mother passing away December 2016 from lung CA and his grandmother passing away January 2017.  Patient's last suicide attempt was on Mother's Day. Patient states "My mother was my everything.  I felt like I didn't have nothing and I just broke down".  Patient reports prior suicide attempt by cutting forearm "the end of April" which resulted in sutures to arm. Patient reports that he denied it was a suicide attempt to Doctor and said it was a "work related incident"e.  Patient reports using 1 gram of crack/cocaine daily, smoking 1/2 pack of cigarettes daily, and drinking "5-6 drinks" daily.  Patient has past medical hx of "4 herniated disk".  Patient was living alone prior to admission. Patient reports that his girlfriend will be moving down to [place] and will be living with him on discharge.  [facility], patient would like "Get back to feeling good by myself" and "Know I have other options besides doing what I did".   Skin was assessed.  Laceration to right arm.  Unable to assess laceration due to being wrapped in compression dressing.  Scratches to left leg. Patient searched and no contraband found, POC and unit policies explained and understanding verbalized. Consents obtained.  Patient had no additional questions or concerns.

## 2015-08-13 NOTE — Progress Notes (Signed)
D: Pt denies SI/HI/AV. Pt is pleasant and cooperative. Pt appears to be remorseful about cutting his arm. Pt stated he was overwhelmed with all the things coming on him at one time.   A: Pt was offered support and encouragement. Pt was given scheduled medications. Pt was encourage to attend groups. Q 15 minute checks were done for safety.   R: Pt is taking medication. Pt has no complaints.Pt receptive to treatment and safety maintained on unit.

## 2015-08-13 NOTE — Consult Note (Signed)
[place] [facility] Consult follow-up  Reason for Consult:  Depression, bereavement and polysubstance abuse Referring Physician:  Dr. Algis Liming Patient Identification: Benjamin [name] MRN:  [id number] Principal Diagnosis: Suicide attempt [facility]) Diagnosis:   Patient Active Problem List   Diagnosis Date Noted  . Suicide attempt ([place]) [T14.91] 08/10/2015  . Laceration of right arm with complication [Z76.734L] 93/79/0240  . Uncontrolled hypertension [I10] 08/10/2015  . Alcohol abuse [F10.10] 08/10/2015  . Cocaine abuse [F14.10] 08/10/2015  . Dehydration [E86.0] 08/10/2015  . Weight loss, non-intentional [R63.4] 08/10/2015  . Depression [F32.9] 08/10/2015  . Essential hypertension [I10]   . Vaccine refused by patient [Z28.20] 05/24/2015  . Routine general medical examination at a health care facility [Z00.00] 05/24/2015  . Chronic back pain [M54.9, G89.29] 05/24/2015  . Abnormal serum creatinine level [R79.9] 05/24/2015  . Family history of cancer [Z80.9] 05/24/2015  . Screening for prostate cancer [Z12.5] 05/24/2015  . Smoker [Z72.0] 05/24/2015  . Cough [R05] 05/24/2015  . Screen for STD (sexually transmitted disease) [Z11.3] 05/24/2015  . Erectile dysfunction [N52.9] 01/31/2014    Total Time spent with patient: 30 minutes  Subjective:   Benjamin [name] is a 50 y.o. male patient admitted with depression, polysubstance abuse and status post suicidal attempt.  HPI: Benjamin [name] is a 50 y.o. male seen, chart reviewed and for face-to-face psychiatric consultation and evaluation. Patient has been suffering with increased symptoms of depression since he lost his mother in December 2016 for lung cancer in [place] and then lost grandmother for unspecified medical problems in January 2017 in [place]. Patient has been busy first few months taking care of mothers bills, and payments etc. Patient stated he was offered hospice care counseling services for bereavement but patient stated he  does not required at that time so gently refused. Patient later within month found himself feeling depressed, sad, missing his mother and could not focus on his work and thought about moving out of mom's home to new rental place with the help of his girlfriend who has planning to come to [place], [place] from [place]. Patient initially started self-medicating with alcohol which was progressed to marijuana and also cocaine. Patient reported last week he was taken time off time from work and has been involved with more and more drug use. Patient reported he has one previous episode of self-injurious behavior on his left forearm required sutures in hospital in 07/24/2015. Patient reported he did lie to the physicians at that time because he does not want to be lable at psychiatric patient. Patient feels his cousin, his younger brother and girlfriend will be supportive to him. Patient stated he does not know why he become depressed and trying to kill himself. Patient blood alcohol is not significant and his urine drug screen is positive for opiates, cocaine, benzodiazepines and tetrahydrocannabinol. Past Psychiatric History: patient has no previous history of acute psychiatric hospitalization or outpatient medication management. Patient given diffuse hospice counseling services after death of his mother thinking his been strong enough and manages himself.   Interval history: Patient seen today for psychiatric consultation follow-up for increased symptoms of depression, polysubstance abuse and bereavement.Patient has status post suicidal attempt by cutting his left forearm with a sharp blade which required intervention of  suturing. Patient complaining about pain on his left forearm and difficult to sleep over night. Patient continue to endorse symptoms of depression and willing to participate voluntarily ine psychiatric hospitalization. Patient has no withdrawal symptoms of drug of abuse at this time  including opiates and Benzo's. Patient continued to meet criteria for acute psychiatric hospitalization.  Risk to Self: Is patient at risk for suicide?: Yes Risk to Others:   Prior Inpatient Therapy:   Prior Outpatient Therapy:    Past Medical History:  Past Medical History  Diagnosis Date  . Erectile dysfunction   . Chronic back pain     managed [facility]    Past Surgical History  Procedure Laterality Date  . Ankle surgery      right  . Hand surgery      fracture, ORIG, teenage years, right.  . Lumbar epidural injection      [facility]  . Tendon repair N/A 07/24/2015    Procedure: WRIST LACERATION AND TENDON REPAIR;  Surgeon: Iran Planas, MD;  Location: [facility];  Service: Orthopedics;  Laterality: N/A;  . I&d extremity Right 08/10/2015    Procedure: IRRIGATION AND DEBRIDEMENT SKIN, SUBCUTANEOS TISSUE AND  MUSCLE, CARPAL TUNNEL RELEASE, MEDIAN NERVE LYSIS WITH NERVE WRAP;  Surgeon: Roseanne Kaufman, MD;  Location: [facility];  Service: Orthopedics;  Laterality: Right;   Family History:  Family History  Problem Relation Age of Onset  . Cancer Mother     lung  . Cancer Father     throat  . Diabetes Paternal Grandmother   . Heart disease Neg Hx   . Stroke Neg Hx    Family Psychiatric  History: Denied and reportedly patient mother suffered with drinking alcohol and smoking marijuana to deal with her stress from the cancer.  Social History:  History  Alcohol Use  . Yes    Comment: occasional     History  Drug Use  . Yes  . Special: Marijuana, Cocaine    Comment: last use cocaine 08/08/15, last use MJ 08/08/15    Social History   Social History  . Marital Status: Single    Spouse Name: N/A  . Number of Children: N/A  . Years of Education: N/A   Social History Main Topics  . Smoking status: Current Every Day Smoker -- 0.50 packs/day for 6 years    Types: Cigarettes  . Smokeless tobacco: None  . Alcohol Use: Yes     Comment: occasional   . Drug Use: Yes    Special: Marijuana, Cocaine     Comment: last use cocaine 08/08/15, last use MJ 08/08/15  . Sexual Activity: Not Asked   Other Topics Concern  . 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   No children   Additional Social History:    Allergies:  No Known Allergies  Labs:  Results for orders placed or performed during the hospital encounter of 08/10/15 (from the past 48 hour(s))  Glucose, capillary     Status: Abnormal   Collection Time: 08/11/15  6:04 PM  Result Value Ref Range   Glucose-Capillary 112 (H) 65 - 99 mg/dL   Comment 1 Notify RN    Comment 2 Document in Chart   Glucose, capillary     Status: Abnormal   Collection Time: 08/11/15  9:09 PM  Result Value Ref Range   Glucose-Capillary 109 (H) 65 - 99 mg/dL  CBC     Status: Abnormal   Collection Time: 08/12/15  5:53 AM  Result Value Ref Range   WBC 6.2 4.0 - 10.5 K/uL   RBC 3.75 (L) 4.22 - 5.81 MIL/uL   Hemoglobin 9.3 (L) 13.0 -  17.0 g/dL   HCT 29.1 (L) 39.0 - 52.0 %   MCV 77.6 (L) 78.0 - 100.0 fL   MCH 24.8 (L) 26.0 - 34.0 pg   MCHC 32.0 30.0 - 36.0 g/dL   RDW 14.3 11.5 - 15.5 %   Platelets 149 (L) 150 - 400 K/uL  Comprehensive metabolic panel     Status: Abnormal   Collection Time: 08/12/15  5:53 AM  Result Value Ref Range   Sodium 141 135 - 145 mmol/L   Potassium 4.1 3.5 - 5.1 mmol/L   Chloride 105 101 - 111 mmol/L   CO2 26 22 - 32 mmol/L   Glucose, Bld 102 (H) 65 - 99 mg/dL   BUN 18 6 - 20 mg/dL   Creatinine, Ser 1.74 (H) 0.61 - 1.24 mg/dL   Calcium 8.2 (L) 8.9 - 10.3 mg/dL   Total Protein 4.7 (L) 6.5 - 8.1 g/dL   Albumin 3.1 (L) 3.5 - 5.0 g/dL   AST 21 15 - 41 U/L   ALT 20 17 - 63 U/L   Alkaline Phosphatase 19 (L) 38 - 126 U/L   Total Bilirubin 1.4 (H) 0.3 - 1.2 mg/dL   GFR calc non Af Amer 45 (L) >60 mL/min   GFR calc Af Amer 52 (L) >60 mL/min    Comment: (NOTE) The eGFR has been calculated using the CKD  EPI equation. This calculation has not been validated in all clinical situations. eGFR's persistently <60 mL/min signify possible Chronic Kidney Disease.    Anion gap 10 5 - 15  Glucose, capillary     Status: None   Collection Time: 08/12/15  7:45 AM  Result Value Ref Range   Glucose-Capillary 89 65 - 99 mg/dL  CBC     Status: Abnormal   Collection Time: 08/13/15  8:14 AM  Result Value Ref Range   WBC 4.1 4.0 - 10.5 K/uL   RBC 3.71 (L) 4.22 - 5.81 MIL/uL   Hemoglobin 9.2 (L) 13.0 - 17.0 g/dL   HCT 29.0 (L) 39.0 - 52.0 %   MCV 78.2 78.0 - 100.0 fL   MCH 24.8 (L) 26.0 - 34.0 pg   MCHC 31.7 30.0 - 36.0 g/dL   RDW 14.7 11.5 - 15.5 %   Platelets 160 150 - 400 K/uL  Basic metabolic panel     Status: Abnormal   Collection Time: 08/13/15  8:14 AM  Result Value Ref Range   Sodium 140 135 - 145 mmol/L   Potassium 3.9 3.5 - 5.1 mmol/L   Chloride 106 101 - 111 mmol/L   CO2 29 22 - 32 mmol/L   Glucose, Bld 99 65 - 99 mg/dL   BUN 15 6 - 20 mg/dL   Creatinine, Ser 1.71 (H) 0.61 - 1.24 mg/dL   Calcium 8.4 (L) 8.9 - 10.3 mg/dL   GFR calc non Af Amer 46 (L) >60 mL/min   GFR calc Af Amer 53 (L) >60 mL/min    Comment: (NOTE) The eGFR has been calculated using the CKD EPI equation. This calculation has not been validated in all clinical situations. eGFR's persistently <60 mL/min signify possible Chronic Kidney Disease.    Anion gap 5 5 - 15    Current Facility-Administered Medications  Medication Dose Route Frequency Provider Last Rate Last Dose  . acetaminophen (TYLENOL) tablet 650 mg  650 mg Oral Q6H PRN Samella Parr, NP      . bisacodyl (DULCOLAX) suppository 10 mg  10 mg Rectal Daily PRN Leisa Lenz  Lissa Merlin, NP      . docusate sodium (COLACE) capsule 100 mg  100 mg Oral BID Samella Parr, NP   100 mg at 08/13/15 0947  . DULoxetine (CYMBALTA) DR capsule 30 mg  30 mg Oral Daily Ambrose Finland, MD   30 mg at 08/13/15 0947  . feeding supplement (ENSURE ENLIVE) (ENSURE ENLIVE)  liquid 237 mL  237 mL Oral BID BM Modena Jansky, MD   237 mL at 08/13/15 1000  . folic acid (FOLVITE) tablet 1 mg  1 mg Oral Daily Wynell Balloon, RPH   1 mg at 08/13/15 0947  . LORazepam (ATIVAN) injection 2-3 mg  2-3 mg Intravenous Q1H PRN Samella Parr, NP   2 mg at 08/12/15 1739  . methocarbamol (ROBAXIN) tablet 500 mg  500 mg Oral Q6H PRN Avelina Laine, PA-C   500 mg at 08/13/15 0426  . morphine 2 MG/ML injection 1-4 mg  1-4 mg Intravenous Q2H PRN Samella Parr, NP   2 mg at 08/13/15 0434  . multivitamin with minerals tablet 1 tablet  1 tablet Oral Daily Modena Jansky, MD   1 tablet at 08/13/15 0947  . oxyCODONE (Oxy IR/ROXICODONE) immediate release tablet 10 mg  10 mg Oral Q4H PRN Modena Jansky, MD   10 mg at 08/13/15 0957  . polyethylene glycol (MIRALAX / GLYCOLAX) packet 17 g  17 g Oral Daily PRN Samella Parr, NP      . sodium chloride flush (NS) 0.9 % injection 3 mL  3 mL Intravenous Q12H Samella Parr, NP   3 mL at 08/10/15 2315  . thiamine (VITAMIN B-1) tablet 100 mg  100 mg Oral Daily Wynell Balloon, RPH   100 mg at 08/13/15 0947  . traZODone (DESYREL) tablet 50 mg  50 mg Oral QHS Ambrose Finland, MD   50 mg at 08/12/15 2130    Musculoskeletal: Strength & Muscle Tone: decreased Gait & Station: unable to stand Patient leans: N/A  Psychiatric Specialty Exam: ROS depression, anxiety, behavior management and polysubstance abuse denied nausea, vomiting, abdominal pain, shortness of breath and chest pain.  Blood pressure 135/65, pulse 50, temperature 98 F (36.7 C), temperature source Oral, resp. rate 18, height 6' 2"  (1.88 m), weight 86.2 kg (190 lb 0.6 oz), SpO2 99 %.Body mass index is 24.39 kg/(m^2).  General Appearance: Guarded  Eye Contact::  Good  Speech:  Clear and Coherent  Volume:  Normal  Mood:  Depressed  Affect:  Constricted and Depressed  Thought Process:  Coherent and Goal Directed  Orientation:  Full (Time, Place, and Person)  Thought  Content:  WDL  Suicidal Thoughts:  Yes.  with intent/plan  Homicidal Thoughts:  No  Memory:  Immediate;   Good Recent;   Fair Remote;   Fair  Judgement:  Impaired  Insight:  Shallow  Psychomotor Activity:  Decreased  Concentration:  Fair  Recall:  Good  Fund of Knowledge:Good  Language: Good  Akathisia:  Negative  Handed:  Right  AIMS (if indicated):     Assets:  Communication Skills Desire for Improvement Financial Resources/Insurance Housing Intimacy Leisure Time Physical Health Resilience Social Support Talents/Skills Transportation Vocational/Educational  ADL's:  Impaired  Cognition: WNL  Sleep:      Treatment Plan Summary: Patient continue to meet criteria for acute psychiatric hospitalization for increased symptoms of depression and status post suicidal attempt by self injurious behavior which required surgical intervention for suturing his left forearmMedication management for increased  depression.  Case discussed with psych as LCSW who has been working on inpatient psychiatric placement and probably will be transferred to behavioral [place] sometime this afternoon  Patient cannot contract for safety and will continue safety sitter   Continue Cymbalta 30 mg daily for depression and trazodone 50 mg at bedtime for insomnia  Appreciate psychiatric consultation and we sign off as of today as patient will be transferred this afternoon. Please contact [phone] or [phone] if needs further assistance    Disposition: Recommend psychiatric Inpatient admission when medically cleared. Supportive therapy provided about ongoing stressors.  Durward Parcel., MD 08/13/2015 10:38 AM

## 2015-08-13 NOTE — Clinical Social Work Psych Note (Signed)
Psych CSW received consult for inpatient psychiatric hospitalization.  Psych CSW now following.  Psych CSW made referrals to the following facilities: - [facility] [place] [facility]  - [facility]  Per report, patient is medically cleared awaiting psych placement.  Of note, payer source is [employer].  Vickii PennaGina Kinser Fellman, LCSW [phone]  2H 1-14; Hospital Psychiatric Service Line Licensed Clinical Social Worker

## 2015-08-13 NOTE — Progress Notes (Signed)
[place] police picked up Mr Benjamin [name] [facility], Brother Deval notified and stepdown unit notified about pt clothing

## 2015-08-13 NOTE — Progress Notes (Signed)
D Pt has made several statements today to this writer, for example" I don't know what I'm going to do...just send me back to the emergency room.Marland Kitchen.Marland Kitchen.I can't stand this pain.Marland Kitchen.Marland Kitchen.I  feel, like ripping this thing off my arm".Pt was given 10 mg oxycodone as soon as this Clinical research associatewriter obtained order , for pt's c/o " 10 out of 10"  Pain in his right arm. Pt seen walking around the 300 hall , with right arm hanging dependent and sitting in the 300 dayroom...with right arm hanging dependent ,  (despite this nurse's instructions to keep right arm with fingers pointed to the sky ). When pt threatened to " rip this thing off my arm" this nurse spoke with both Dr. Jama Flavorsobos and [facility]), and no additional orders obtained. Pt given additional prn ativan at 1729 and he is currently sitting quietly in the dayroom.calm and quiet. A Safety maintained. R Cont with poc.
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
## 2015-08-13 NOTE — Progress Notes (Addendum)
Full report given to Christina at 11:20, [place] [facility]. [facility] Cecile HearingHogalgi notified.

## 2015-08-13 NOTE — Clinical Social Work Note (Addendum)
Psych CSW received [facility] that [facility] by Fransisca KaufmannLaura Davis. RN to call report prior to transportation to [phone] Transportation: Sheriff - scheduled at 11:44am Patient is involuntary.  IVC paperwork is on the chart.   Vickii PennaGina Keziah Avis, LCSW [phone]  2H 1-14; Hospital Psychiatric Service Line Licensed Clinical Social Worker

## 2015-08-14 ENCOUNTER — Encounter (HOSPITAL_COMMUNITY): Payer: Self-pay | Admitting: Registered Nurse

## 2015-08-14 DIAGNOSIS — F322 Major depressive disorder, single episode, severe without psychotic features: Secondary | ICD-10-CM | POA: Diagnosis present

## 2015-08-14 DIAGNOSIS — F1410 Cocaine abuse, uncomplicated: Secondary | ICD-10-CM | POA: Clinically undetermined

## 2015-08-14 DIAGNOSIS — F1210 Cannabis abuse, uncomplicated: Secondary | ICD-10-CM

## 2015-08-14 DIAGNOSIS — F1110 Opioid abuse, uncomplicated: Secondary | ICD-10-CM | POA: Clinically undetermined

## 2015-08-14 DIAGNOSIS — F332 Major depressive disorder, recurrent severe without psychotic features: Secondary | ICD-10-CM | POA: Clinically undetermined

## 2015-08-14 MED ORDER — METHOCARBAMOL 500 MG PO TABS
500.0000 mg | ORAL_TABLET | Freq: Four times a day (QID) | ORAL | Status: DC | PRN
  Administered 2015-08-14: 500 mg via ORAL
  Filled 2015-08-14: qty 1

## 2015-08-14 MED ORDER — CHLORDIAZEPOXIDE HCL 25 MG PO CAPS
25.0000 mg | ORAL_CAPSULE | Freq: Three times a day (TID) | ORAL | Status: DC
  Administered 2015-08-15 (×2): 25 mg via ORAL
  Filled 2015-08-14 (×2): qty 1

## 2015-08-14 MED ORDER — CHLORDIAZEPOXIDE HCL 25 MG PO CAPS: 25.0000 mg | ORAL_CAPSULE | Freq: Every day | ORAL | Status: DC

## 2015-08-14 MED ORDER — HYDROXYZINE HCL 25 MG PO TABS
25.0000 mg | ORAL_TABLET | Freq: Four times a day (QID) | ORAL | Status: DC | PRN
  Administered 2015-08-14: 25 mg via ORAL
  Filled 2015-08-14: qty 1

## 2015-08-14 MED ORDER — CHLORDIAZEPOXIDE HCL 25 MG PO CAPS
25.0000 mg | ORAL_CAPSULE | Freq: Four times a day (QID) | ORAL | Status: AC
  Administered 2015-08-14 – 2015-08-15 (×4): 25 mg via ORAL
  Filled 2015-08-14 (×4): qty 1

## 2015-08-14 MED ORDER — LOPERAMIDE HCL 2 MG PO CAPS: 2.0000 mg | ORAL_CAPSULE | ORAL | Status: DC | PRN

## 2015-08-14 MED ORDER — NICOTINE 21 MG/24HR TD PT24
21.0000 mg | MEDICATED_PATCH | Freq: Every day | TRANSDERMAL | Status: DC
Start: 2015-08-14 — End: 2015-08-16
  Administered 2015-08-14 – 2015-08-15 (×2): 21 mg via TRANSDERMAL
  Filled 2015-08-14 (×5): qty 1

## 2015-08-14 MED ORDER — CIPROFLOXACIN HCL 500 MG PO TABS
500.0000 mg | ORAL_TABLET | Freq: Two times a day (BID) | ORAL | Status: DC
  Administered 2015-08-14 – 2015-08-15 (×3): 500 mg via ORAL
  Filled 2015-08-14 (×7): qty 1

## 2015-08-14 MED ORDER — CHLORDIAZEPOXIDE HCL 25 MG PO CAPS: 25.0000 mg | ORAL_CAPSULE | Freq: Four times a day (QID) | ORAL | Status: DC | PRN

## 2015-08-14 MED ORDER — CIPROFLOXACIN HCL 500 MG PO TABS
500.0000 mg | ORAL_TABLET | Freq: Two times a day (BID) | ORAL | Status: DC
  Filled 2015-08-14 (×3): qty 1

## 2015-08-14 MED ORDER — ONDANSETRON 4 MG PO TBDP: 4.0000 mg | ORAL_TABLET | Freq: Four times a day (QID) | ORAL | Status: DC | PRN

## 2015-08-14 MED ORDER — CHLORDIAZEPOXIDE HCL 25 MG PO CAPS: 25.0000 mg | ORAL_CAPSULE | ORAL | Status: DC

## 2015-08-14 NOTE — Progress Notes (Signed)
Patient did attend the evening speaker AA meeting.  
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
## 2015-08-14 NOTE — H&P (Signed)
Psychiatric Admission Assessment Adult  Patient Identification: Benjamin [name] MRN:  [id number] Date of Evaluation:  08/14/2015 Chief Complaint:  MDD-BEREAVEMENT POLYSUBSTANCE ABUSE Principal Diagnosis: Major depressive disorder, single episode, severe ([place]) Diagnosis:   Patient Active Problem List   Diagnosis Date Noted  . Major depressive disorder, single episode, severe ([place]) [F32.2] 08/14/2015  . Polysubstance abuse [F19.10] 08/13/2015  . Suicide attempt ([place]) [T14.91] 08/10/2015  . Laceration of right arm with complication [U38.453M] 46/80/3212  . Uncontrolled hypertension [I10] 08/10/2015  . Alcohol abuse [F10.10] 08/10/2015  . Cocaine abuse [F14.10] 08/10/2015  . Dehydration [E86.0] 08/10/2015  . Weight loss, non-intentional [R63.4] 08/10/2015  . Depression [F32.9] 08/10/2015  . Essential hypertension [I10]   . Vaccine refused by patient [Z28.20] 05/24/2015  . Routine general medical examination at a health care facility [Z00.00] 05/24/2015  . Chronic back pain [M54.9, G89.29] 05/24/2015  . Abnormal serum creatinine level [R79.9] 05/24/2015  . Family history of cancer [Z80.9] 05/24/2015  . Screening for prostate cancer [Z12.5] 05/24/2015  . Smoker [Z72.0] 05/24/2015  . Cough [R05] 05/24/2015  . Screen for STD (sexually transmitted disease) [Z11.3] 05/24/2015  . Erectile dysfunction [N52.9] 01/31/2014   History of Present Illness::[name] is a 50 y.o. Male.  Patient seen by this provider, case reviewed with social worker and nursing.  On evaluation:  Benjamin [name] reports he has been depressed since the death of his mother March 27, 2023 and then a worsening of his depression with the death of his grandmother 04-27-2022.  Patient has been taking care of his mothers bills for the last few months, unable to concentrate on his work and losing focus with the thoughts of his mothers death and having to move out of her home and into rental property.  He started to self medicate with alcohol  and marijuana and eventually starting using cocaine. Last week he took time out of work but saw an increase in his drug use.  UDS positive for opiates, cocaine, benzodiazepines and marijuana. "I just felt overwhelmed with the bills, the house.  After my mothers death there was still more; I didn't know what to do with stuff.  I was trying to get my things together for me and my girl.  I was never the one to try drugs but I was looking for a way to get rid of the pain."  States that he did however drink alcohol everyday prior to the death of his mother or grandmother."  He reports that he has one previous episode of self injurious behavior where he cut himself on left forearm and required sutures 4/17.  States that he lied saying it was not a suicide attempt because he did not want to be psychiatric patient.  He Denies any other past psychiatric history of hospitalization or outpatient services.  He was offered hospice counseling services after the death of his mother but refused thinking he was strong enough to handle things himself.  He has a girlfriend in [place] who plans to move here with him.  Also reports that he has a cousin and younger brother who is supportive.      Associated Signs/Symptoms: Depression Symptoms:  depressed mood, insomnia, feelings of worthlessness/guilt, difficulty concentrating, hopelessness, anxiety, weight loss, anger (Hypo) Manic Symptoms:  Distractibility, Impulsivity, Irritable Mood, Anxiety Symptoms:  Agoraphobia, Excessive Worry, Psychotic Symptoms:  Denies PTSD Symptoms: Denies Total Time spent with patient: 1 hour  Past Psychiatric History: Depression.  One incident of self injurious behavior (cutting requiring sutures) 4/14 and current incident  of trying to kill himself by cutting his right arm.    Is the patient at risk to self? Yes.    Has the patient been a risk to self in the past 6 months? Yes.    Has the patient been a risk to self within the  distant past? No.  Is the patient a risk to others? No.  Has the patient been a risk to others in the past 6 months? No.  Has the patient been a risk to others within the distant past? No.   Prior Inpatient Therapy:  Denies Prior Outpatient Therapy:  Denies  Alcohol Screening: 1. How often do you have a drink containing alcohol?: 4 or more times a week 2. How many drinks containing alcohol do you have on a typical day when you are drinking?: 5 or 6 3. How often do you have six or more drinks on one occasion?: Daily or almost daily Preliminary Score: 6 4. How often during the last year have you found that you were not able to stop drinking once you had started?: Never 5. How often during the last year have you failed to do what was normally expected from you becasue of drinking?: Never 6. How often during the last year have you needed a first drink in the morning to get yourself going after a heavy drinking session?: Never 7. How often during the last year have you had a feeling of guilt of remorse after drinking?: Never 8. How often during the last year have you been unable to remember what happened the night before because you had been drinking?: Never 9. Have you or someone else been injured as a result of your drinking?: No 10. Has a relative or friend or a doctor or another health worker been concerned about your drinking or suggested you cut down?: No Alcohol Use Disorder Identification Test Final Score (AUDIT): 10 Brief Intervention: Patient declined brief intervention Substance Abuse History in the last 12 months:  Yes.   Consequences of Substance Abuse: Denies Previous Psychotropic Medications: No  Psychological Evaluations: No Past Medical History:  Past Medical History  Diagnosis Date  . Erectile dysfunction   . Chronic back pain     managed by [facility]    Past Surgical History  Procedure Laterality Date  . Ankle surgery      right  . Hand surgery       fracture, ORIG, teenage years, right.  . Lumbar epidural injection      [facility]  . Tendon repair N/A 07/24/2015    Procedure: WRIST LACERATION AND TENDON REPAIR;  Surgeon: Iran Planas, MD;  Location: [facility];  Service: Orthopedics;  Laterality: N/A;  . I&d extremity Right 08/10/2015    Procedure: IRRIGATION AND DEBRIDEMENT SKIN, SUBCUTANEOS TISSUE AND  MUSCLE, CARPAL TUNNEL RELEASE, MEDIAN NERVE LYSIS WITH NERVE WRAP;  Surgeon: Roseanne Kaufman, MD;  Location: [facility];  Service: Orthopedics;  Laterality: Right;   Family History:  Family History  Problem Relation Age of Onset  . Cancer Mother     lung  . Cancer Father     throat  . Diabetes Paternal Grandmother   . Heart disease Neg Hx   . Stroke Neg Hx    Family Psychiatric  History: Denies family history of mental illness or substance abuse Tobacco Screening: Everyday smoker Social History:  History  Alcohol Use  . Yes    Comment: occasional     History  Drug  Use  . Yes  . Special: Marijuana, Cocaine    Comment: last use cocaine 08/08/15, last use MJ 08/08/15    Additional Social History:  Allergies:  No Known Allergies Lab Results:  Results for orders placed or performed during the hospital encounter of 08/10/15 (from the past 48 hour(s))  CBC     Status: Abnormal   Collection Time: 08/13/15  8:14 AM  Result Value Ref Range   WBC 4.1 4.0 - 10.5 K/uL   RBC 3.71 (L) 4.22 - 5.81 MIL/uL   Hemoglobin 9.2 (L) 13.0 - 17.0 g/dL   HCT 29.0 (L) 39.0 - 52.0 %   MCV 78.2 78.0 - 100.0 fL   MCH 24.8 (L) 26.0 - 34.0 pg   MCHC 31.7 30.0 - 36.0 g/dL   RDW 14.7 11.5 - 15.5 %   Platelets 160 150 - 400 K/uL  Basic metabolic panel     Status: Abnormal   Collection Time: 08/13/15  8:14 AM  Result Value Ref Range   Sodium 140 135 - 145 mmol/L   Potassium 3.9 3.5 - 5.1 mmol/L   Chloride 106 101 - 111 mmol/L   CO2 29 22 - 32 mmol/L   Glucose, Bld 99 65 - 99 mg/dL   BUN 15 6 - 20 mg/dL   Creatinine, Ser 1.71 (H) 0.61 -  1.24 mg/dL   Calcium 8.4 (L) 8.9 - 10.3 mg/dL   GFR calc non Af Amer 46 (L) >60 mL/min   GFR calc Af Amer 53 (L) >60 mL/min    Comment: (NOTE) The eGFR has been calculated using the CKD EPI equation. This calculation has not been validated in all clinical situations. eGFR's persistently <60 mL/min signify possible Chronic Kidney Disease.    Anion gap 5 5 - 15    Blood Alcohol level:  Lab Results  Component Value Date   ETH <5 78/93/8101    Metabolic Disorder Labs:  Lab Results  Component Value Date   HGBA1C 5.8* 05/24/2015   MPG 120* 05/24/2015   No results found for: PROLACTIN Lab Results  Component Value Date   CHOL 202* 05/24/2015   TRIG 106 05/24/2015   HDL 86 05/24/2015   CHOLHDL 2.3 05/24/2015   VLDL 21 05/24/2015   LDLCALC 95 05/24/2015   LDLCALC 111 11/03/2014    Current Medications: Current Facility-Administered Medications  Medication Dose Route Frequency Provider Last Rate Last Dose  . acetaminophen (TYLENOL) tablet 650 mg  650 mg Oral Q6H PRN Niel Hummer, NP      . alum & mag hydroxide-simeth (MAALOX/MYLANTA) 200-200-20 MG/5ML suspension 30 mL  30 mL Oral Q4H PRN Niel Hummer, NP      . chlordiazePOXIDE (LIBRIUM) capsule 25 mg  25 mg Oral Q6H PRN Tevis Conger B Marria Mathison, NP      . chlordiazePOXIDE (LIBRIUM) capsule 25 mg  25 mg Oral QID Marche Hottenstein B Kloee Ballew, NP       Followed by  . [START ON 08/15/2015] chlordiazePOXIDE (LIBRIUM) capsule 25 mg  25 mg Oral TID Jens Siems B Trinnity Breunig, NP       Followed by  . [START ON 08/16/2015] chlordiazePOXIDE (LIBRIUM) capsule 25 mg  25 mg Oral BH-qamhs Mrytle Bento B Laela Deviney, NP       Followed by  . [START ON 08/18/2015] chlordiazePOXIDE (LIBRIUM) capsule 25 mg  25 mg Oral Daily Ngoc Daughtridge B Romell Cavanah, NP      . ciprofloxacin (CIPRO) tablet 500 mg  500 mg Oral BID Arath Kaigler B Ameir Faria, NP      .  docusate sodium (COLACE) capsule 100 mg  100 mg Oral BID Niel Hummer, NP   100 mg at 08/14/15 0753  . DULoxetine (CYMBALTA) DR capsule 30 mg  30 mg Oral Daily  Niel Hummer, NP   30 mg at 08/14/15 0753  . feeding supplement (ENSURE ENLIVE) (ENSURE ENLIVE) liquid 237 mL  237 mL Oral BID BM Niel Hummer, NP   237 mL at 08/13/15 1430  . folic acid (FOLVITE) tablet 1 mg  1 mg Oral Daily Niel Hummer, NP   1 mg at 08/14/15 0753  . gabapentin (NEURONTIN) capsule 100 mg  100 mg Oral TID Kerrie Buffalo, NP   100 mg at 08/14/15 0753  . hydrOXYzine (ATARAX/VISTARIL) tablet 25 mg  25 mg Oral Q6H PRN Jaymz Traywick B Lincoln Kleiner, NP      . loperamide (IMODIUM) capsule 2-4 mg  2-4 mg Oral PRN Trinidad Petron B Myriam Brandhorst, NP      . magnesium hydroxide (MILK OF MAGNESIA) suspension 30 mL  30 mL Oral Daily PRN Niel Hummer, NP      . methocarbamol (ROBAXIN) tablet 500 mg  500 mg Oral Q6H PRN Kanyia Heaslip B Braedyn Riggle, NP      . multivitamin with minerals tablet 1 tablet  1 tablet Oral Daily Niel Hummer, NP   1 tablet at 08/14/15 0753  . ondansetron (ZOFRAN-ODT) disintegrating tablet 4 mg  4 mg Oral Q6H PRN Shantoya Geurts B Sharyn Brilliant, NP      . oxyCODONE (Oxy IR/ROXICODONE) immediate release tablet 10 mg  10 mg Oral Q4H PRN Kerrie Buffalo, NP   10 mg at 08/14/15 0756  . polyethylene glycol (MIRALAX / GLYCOLAX) packet 17 g  17 g Oral Daily PRN Niel Hummer, NP      . thiamine (VITAMIN B-1) tablet 100 mg  100 mg Oral Daily Niel Hummer, NP   100 mg at 08/14/15 0753  . traZODone (DESYREL) tablet 50 mg  50 mg Oral QHS Niel Hummer, NP   50 mg at 08/13/15 2158   PTA Medications: Prescriptions prior to admission  Medication Sig Dispense Refill Last Dose  . HYDROcodone-acetaminophen (NORCO) 10-325 MG tablet Take 1 tablet by mouth 4 (four) times daily as needed for moderate pain.   0 Past Week at Unknown time  . ciprofloxacin (CIPRO) 500 MG tablet Take 1 tablet (500 mg total) by mouth 2 (two) times daily. For 2 weeks beginning 08/13/15.     Marland Kitchen docusate sodium (COLACE) 100 MG capsule Take 1 capsule (100 mg total) by mouth 2 (two) times daily. (Patient not taking: Reported on 08/13/2015) 30 capsule 0   . DULoxetine  (CYMBALTA) 30 MG capsule Take 1 capsule (30 mg total) by mouth daily.     . folic acid (FOLVITE) 1 MG tablet Take 1 tablet (1 mg total) by mouth daily.     . methocarbamol (ROBAXIN) 500 MG tablet Take 1 tablet (500 mg total) by mouth every 6 (six) hours as needed for muscle spasms.     . Multiple Vitamin (MULTIVITAMIN WITH MINERALS) TABS tablet Take 1 tablet by mouth daily.     Marland Kitchen thiamine 100 MG tablet Take 1 tablet (100 mg total) by mouth daily.     . traZODone (DESYREL) 50 MG tablet Take 1 tablet (50 mg total) by mouth at bedtime.       Musculoskeletal: Strength & Muscle Tone: within normal limits Gait & Station: normal Patient leans: N/A  Psychiatric Specialty Exam: Physical Exam  Constitutional:  He is oriented to person, place, and time.  HENT:  Head: Normocephalic.  Neck: Normal range of motion.  Respiratory: Effort normal.  Musculoskeletal: Normal range of motion.  Neurological: He is alert and oriented to person, place, and time. Gait normal.  Skin: Skin is warm and dry.  Has old laceration on his left wrist.  Scabbed over no s/s of infection noted. Right arm sutures; wound covered by bandage that is not to be removed until follow up with doctor.  See H&P note  Psychiatric: His speech is normal and behavior is normal. His mood appears anxious. Thought content is not paranoid and not delusional. Cognition and memory are normal. He expresses impulsivity. He exhibits a depressed mood. He expresses suicidal ideation. He expresses no homicidal ideation.    Review of Systems  Gastrointestinal: Positive for diarrhea (Related to taking two stool softeners. one discontinued).  Musculoskeletal: Positive for back pain.       Complaints of pain right arm.  Wound covered; and also left wrist  Skin:       Has old laceration on his left wrist.  Scabbed over no s/s of infection noted.  Neurological: Negative for dizziness and tremors.  Psychiatric/Behavioral: Positive for depression and  substance abuse. Negative for hallucinations. Suicidal ideas: Denies at this time. The patient is nervous/anxious and has insomnia.   All other systems reviewed and are negative.   Blood pressure 133/83, pulse 59, temperature 97.8 F (36.6 C), temperature source Oral, resp. rate 20, height 6' (1.829 m), weight 82.555 kg (182 lb), SpO2 99 %.Body mass index is 24.68 kg/(m^2).  General Appearance: Casual  Eye Contact::  Good  Speech:  Clear and Coherent and Normal Rate  Volume:  Normal  Mood:  Depressed  Affect:  Depressed and Flat  Thought Process:  Circumstantial  Orientation:  Full (Time, Place, and Person)  Thought Content:  Denies hallucinations, delusions, and paranoia  Suicidal Thoughts:  At this time denies suicidal thoughts.  Prior cut arm with the intent of killing himself  Homicidal Thoughts:  No  Memory:  Immediate;   Good Recent;   Good Remote;   Good  Judgement:  Fair  Insight:  Lacking  Psychomotor Activity:  Normal  Concentration:  Fair  Recall:  Good  Fund of Knowledge:Good  Language: Good  Akathisia:  No  Handed:  Right  AIMS (if indicated):     Assets:  Communication Skills Desire for Improvement Housing Social Support  ADL's:  Intact  Cognition: WNL  Sleep:  Number of Hours: 6.75     Treatment Plan Summary: Daily contact with patient to assess and evaluate symptoms and progress in treatment and Medication management  Observation Level/Precautions:  Continuous Observation for safety  Laboratory:  CBC Chemistry Profile UDS UA ED labs reviewed.  repeating CBC and BMP at [facility] in 48-72 hours (May 21 or 22) to ensure stability; follow final blood culture results that were sent from [facility].   Psychotherapy:  Individual and group session  Medications:  Major Depressive Disorder: Cymbalta DR 30 mg daily; Insomnia: Trazodone 50 mg Q hs prn; Pain:  Oxycodone 10 mg Q 4 hr prn; Muscle spasm: Robaxin 500 mg a 6 hr prn;  Agitation/Neuropathic pain:Neurontin 100 mg Tid; Alcohol detox/withdrawal: Librium detox protocol; Constipation: Colace 100 mg bid.  Discontinued Dulcolax.  Bacterial Infection(arm): Cipro 500 mg Bid (started 4/20) continue   Consultations:  Patient will need to follow up/consult with the physicians listed below under the recommendations  for outpatient follow after his discharge from [facility] for the treatment of his arm.  Discharge Concerns:  Safety, stabilization, and access to medication  Estimated LOS: 5-7 days  Other:      Plan: Patient will need these follow up for treatment of right arm   Hospital [facility]) Recommendations for Outpatient Follow-up:  1. Chana Bode, PA-C/PCP in 2 weeks, upon discharge from [facility]. To be seen with repeat labs (CBC & BMP). 2. Dr. Roseanne Kaufman, Orthopedics/Hand Surgeon: To be seen by end of next week for postop wound check, suture removal and further recommendations. If patient remains at the [facility] during that time, recommend that his care be discussed with Dr. Amedeo Plenty. 3. Patient will need prescriptions for his antibiotics (remainder of dose), pain medications and newly started psychiatric medications upon discharge from [facility]. 4. Recommend repeating CBC and BMP at [facility] in 48-72 hours to ensure stability. 5. Please follow final blood culture results that were sent from [facility].    I certify that inpatient services furnished can reasonably be expected to improve the patient's condition.    Riva Sesma, NP 5/20/201710:55 AM

## 2015-08-14 NOTE — [facility] Group Notes (Signed)
[facility] Group Notes:  (Nursing/MHT/Case Management/Adjunct)  Date:  08/14/2015  Time:  1400-1500  Type of Therapy:  Nurse Education  Life SKills  :  The group is focused on teaching patients how to identify their needs as well as develop healthy skills needed to get their needs met.  Participation Level: Patient did not attend   Participation Quality:  N/A  Affect:  N/A  Cognitive:  N/A  Insight:N/A    Engagement in Group:  N/A  Modes of Intervention:  N/A  Summary of Progress/Problems:  Lauralyn Primes 08/14/2015, 5:50 PM

## 2015-08-14 NOTE — [facility] Group Notes (Signed)
[facility] Group Notes:  (Clinical Social Work)   01/23/2015     10:00-11:00AM  Summary of Progress/Problems:   In today's process group a decisional balance exercise was used to explore in depth the perceived benefits and costs of unhealthy coping skills including negative self-talk, suicide attempts, and drugs/alcohol, as well as the  benefits and costs of replacing these with healthy coping skills.  Patients listed healthy and unhealthy coping techniques, determining that unhealthy coping techniques work initially, but eventually become harmful.  Motivational Interviewing and the whiteboard were utilized for the exercises.  The patient expressed that the unhealthy coping he most recently used was 2 suicide attempts because he could not stop thinking, also referencing drug and alcohol use.  He was initially very quiet, speaking up more as the group progressed.  Type of Therapy:  Group Therapy - Process   Participation Level:  Active  Participation Quality:  Attentive and Sharing  Affect:  Blunted and Depressed  Cognitive:  Appropriate and Oriented  Insight:  Engaged  Engagement in Therapy:  Engaged  Modes of Intervention:  Education, Motivational Interviewing  Ambrose MantleMareida Grossman-Orr, LCSW 08/14/2015, 12:37 PM

## 2015-08-14 NOTE — [facility] Suicide Risk Assessment (Signed)
[facility] Admission Suicide Risk Assessment   Nursing information obtained from:  Patient Demographic factors:  Male, Living alone, Unemployed Current Mental Status:  Suicidal ideation indicated by patient, Suicide plan, Plan includes specific time, place, or method, Self-harm thoughts, Self-harm behaviors, Intention to act on suicide plan, Belief that plan would result in death Loss Factors:  Loss of significant relationship Historical Factors:  Prior suicide attempts Risk Reduction Factors:  Positive social support  Total Time spent with patient: 30 minutes Principal Problem: MDD (major depressive disorder), single episode, severe , no psychosis (HCC) Diagnosis:   Patient Active Problem List   Diagnosis Date Noted  . MDD (major depressive disorder), single episode, severe , no psychosis (HCC) [F32.2] 08/14/2015  . Cocaine use disorder, mild, abuse [F14.10] 08/14/2015  . Cannabis use disorder, mild, abuse [F12.10] 08/14/2015  . Opioid use disorder, mild, abuse [F11.10] 08/14/2015  . Suicide attempt (HCC) [T14.91] 08/10/2015  . Laceration of right arm with complication [S41.111A] 08/10/2015  . Uncontrolled hypertension [I10] 08/10/2015  . Dehydration [E86.0] 08/10/2015  . Weight loss, non-intentional [R63.4] 08/10/2015  . Depression [F32.9] 08/10/2015  . Essential hypertension [I10]   . Vaccine refused by patient [Z28.20] 05/24/2015  . Routine general medical examination at a health care facility [Z00.00] 05/24/2015  . Chronic back pain [M54.9, G89.29] 05/24/2015  . Abnormal serum creatinine level [R79.9] 05/24/2015  . Family history of cancer [Z80.9] 05/24/2015  . Screening for prostate cancer [Z12.5] 05/24/2015  . Smoker [Z72.0] 05/24/2015  . Cough [R05] 05/24/2015  . Screen for STD (sexually transmitted disease) [Z11.3] 05/24/2015  . Erectile dysfunction [N52.9] 01/31/2014   Subjective Data: Patient states he had worsening depressive sx, and he started abusing a lot of drugs  including cocaine, BZD, opioids and cannabis . Pt reports he decided to kill himself since he felt very hopeless and when he attempted suicide he was sober . Pt reports sleep issues and is willing to be on an antidepressant.   Continued Clinical Symptoms:  Alcohol Use Disorder Identification Test Final Score (AUDIT): 10 The "Alcohol Use Disorders Identification Test", Guidelines for Use in Primary Care, Second Edition.  World Science writer [facility]). Score between 0-7:  no or low risk or alcohol related problems. Score between 8-15:  moderate risk of alcohol related problems. Score between 16-19:  high risk of alcohol related problems. Score 20 or above:  warrants further diagnostic evaluation for alcohol dependence and treatment.   CLINICAL FACTORS:   Depression:   Anhedonia Hopelessness Impulsivity Insomnia Alcohol/Substance Abuse/Dependencies   Musculoskeletal: Strength & Muscle Tone: within normal limits Gait & Station: normal Patient leans: N/A  Psychiatric Specialty Exam: Review of Systems  Psychiatric/Behavioral: Positive for depression, suicidal ideas (S/P suicide attempt) and substance abuse. The patient is nervous/anxious and has insomnia.   All other systems reviewed and are negative.   Blood pressure 140/86, pulse 64, temperature 97.8 F (36.6 C), temperature source Oral, resp. rate 20, height 6' (1.829 m), weight 82.555 kg (182 lb), SpO2 99 %.Body mass index is 24.68 kg/(m^2).  General Appearance: Fairly Groomed  Patent attorney::  Fair  Speech:  Normal Rate  Volume:  Normal  Mood:  Depressed  Affect:  Congruent  Thought Process:  Goal Directed  Orientation:  Full (Time, Place, and Person)  Thought Content:  Rumination  Suicidal Thoughts:  Yes.  S/P suicide attempt by cutting requiring sutures.  Homicidal Thoughts:  No  Memory:  Immediate;   Fair Recent;   Fair Remote;   Fair  Judgement:  Impaired  Insight:  Shallow  Psychomotor Activity:  Normal   Concentration:  Fair  Recall:  FiservFair  Fund of Knowledge:Fair  Language: Fair  Akathisia:  No  Handed:  Right  AIMS (if indicated):     Assets:  Desire for Improvement  Sleep:  Number of Hours: 6.75  Cognition: WNL  ADL's:  Intact    COGNITIVE FEATURES THAT CONTRIBUTE TO RISK:  Closed-mindedness, Polarized thinking and Thought constriction (tunnel vision)    SUICIDE RISK:   Severe:  Frequent, intense, and enduring suicidal ideation, specific plan, no subjective intent, but some objective markers of intent (i.e., choice of lethal method), the method is accessible, some limited preparatory behavior, evidence of impaired self-control, severe dysphoria/symptomatology, multiple risk factors present, and few if any protective factors, particularly a lack of social support.  PLAN OF CARE:  Patient will benefit from inpatient treatment and stabilization.  Estimated length of stay is 5-7 days.  Reviewed past medical records,treatment plan.  [facility] - Please also see H&P. CSW will start working on disposition.  Patient to participate in therapeutic milieu .      I certify that inpatient services furnished can reasonably be expected to improve the patient's condition.   Izacc Demeyer, MD 08/14/2015, 12:32 PM

## 2015-08-14 NOTE — Progress Notes (Addendum)
D: Patient complaining of severe pain in R arm due to extensive surgery. Patient rates pain 10/10. He was complaining that he is not getting his pain medication as he should, explained to patient his pain medication schedule. Patient has an incision where he cut his arm in a suicide attempt. Patient also has lacerations (healing) on left arm from an incision he made a few weeks ago. Patient has compression bandage on R arm and will need to have the sutures removed by the end of the week. Capillary refill is less than 3 seconds and he can move his fingers freely. No sign of swelling or redness. Patient was given his doctor's number so he can make a follow up appointment after discharge. Patient presents with flat affect. He rates his depression and anxiety as a 5; anxiety as a 4. He states his suicidal thoughts have decreased. He denies HI/AVH. Patient is dealing with grief issues due to the death of mother and grandmother in the past two years. He is concerned with having medications in timely fashion. He did not attend his group session.  A: Patient's safety maintained through q15 min checks, education, support, and encouragement offered, and medications administered. R: Patient is receptive and complaint, will continue to monitor .

## 2015-08-14 NOTE — [facility] Group Notes (Signed)
[facility] Group Notes:  (Nursing/MHT/Case Management/Adjunct)  Date:  08/14/2015  Time:  9:22 AM  Type of Therapy:  Psychoeducational Skills  Participation Level:  Did Not Attend  Invited; declined to attend.  Cranford MonBeaudry, Yarrow Linhart Evans 08/14/2015, 9:22 AM

## 2015-08-14 NOTE — BHH Counselor (Signed)
CSW spoke with girlfriend Sharyon MedicusChristine Canino [phone] to give names of rehab facilities to research and contact about whether they will take pt's insurance, how much co-pay would be.  This was done at pt's request.  Ambrose MantleMareida Grossman-Orr, LCSW 08/14/2015, 5:15 PM

## 2015-08-14 NOTE — Progress Notes (Signed)
D: Patient complaining of severe pain in R arm due to extensive surgery.  Patient has an incision where he cut his arm in a suicide attempt.  Patient also has lacerations (healing) on left arm from an incision he made a few weeks ago.  Patient has compression bandage on R arm and will need to have the sutures removed by the end of the week. Capillary refill is less than 3 seconds and he can move his fingers freely.  No sign of swelling or redness.  Patient was given his doctor's number so he can make a follow up appointment after discharge.  Patient presents with flat affect.  He rates his depression and anxiety as a 5; anxiety as a 4.  He states his suicidal thoughts have decreased.  He denies HI/AVH.  Patient is dealing with grief issues due to the death of mother and grandmother in the past two years.   A: Continue to monitor medication management and MD orders.  Continue 15 minute safety checks per protocol.  Offer support and encouragement as needed. R: Patient is receptive to staff; his behavior is appropriate.

## 2015-08-15 ENCOUNTER — Encounter (HOSPITAL_COMMUNITY): Payer: Self-pay | Admitting: Certified Registered Nurse Anesthetist

## 2015-08-15 ENCOUNTER — Inpatient Hospital Stay (HOSPITAL_COMMUNITY): Payer: BLUE CROSS/BLUE SHIELD | Admitting: Certified Registered Nurse Anesthetist

## 2015-08-15 ENCOUNTER — Encounter (HOSPITAL_COMMUNITY)
Admission: AD | Disposition: A | Discharge status: Other Acute Inpatient Hospital | Payer: Self-pay | Attending: Orthopedic Surgery

## 2015-08-15 DIAGNOSIS — G4700 Insomnia, unspecified: Secondary | ICD-10-CM

## 2015-08-15 DIAGNOSIS — F1010 Alcohol abuse, uncomplicated: Secondary | ICD-10-CM

## 2015-08-15 DIAGNOSIS — F1910 Other psychoactive substance abuse, uncomplicated: Secondary | ICD-10-CM

## 2015-08-15 DIAGNOSIS — S5011XA Contusion of right forearm, initial encounter: Secondary | ICD-10-CM | POA: Diagnosis present

## 2015-08-15 HISTORY — PX: INCISION AND DRAINAGE OF WOUND: SHX1803

## 2015-08-15 HISTORY — PX: WOUND EXPLORATION: SHX6188

## 2015-08-15 LAB — CBC WITH DIFFERENTIAL/PLATELET
BASOS ABS: 0 10*3/uL (ref 0.0–0.1)
Basophils Relative: 0 %
EOS ABS: 0 10*3/uL (ref 0.0–0.7)
EOS PCT: 0 %
HCT: 30.6 % — ABNORMAL LOW (ref 39.0–52.0)
HEMOGLOBIN: 10 g/dL — AB (ref 13.0–17.0)
LYMPHS ABS: 0.9 10*3/uL (ref 0.7–4.0)
LYMPHS PCT: 10 %
MCH: 25.6 pg — AB (ref 26.0–34.0)
MCHC: 32.7 g/dL (ref 30.0–36.0)
MCV: 78.5 fL (ref 78.0–100.0)
Monocytes Absolute: 0.8 10*3/uL (ref 0.1–1.0)
Monocytes Relative: 8 %
NEUTROS PCT: 82 %
Neutro Abs: 7.5 10*3/uL (ref 1.7–7.7)
PLATELETS: 206 10*3/uL (ref 150–400)
RBC: 3.9 MIL/uL — AB (ref 4.22–5.81)
RDW: 14.7 % (ref 11.5–15.5)
WBC: 9.2 10*3/uL (ref 4.0–10.5)

## 2015-08-15 LAB — BASIC METABOLIC PANEL
Anion gap: 7 (ref 5–15)
BUN: 24 mg/dL — AB (ref 6–20)
CHLORIDE: 106 mmol/L (ref 101–111)
CO2: 27 mmol/L (ref 22–32)
Calcium: 9.4 mg/dL (ref 8.9–10.3)
Creatinine, Ser: 1.52 mg/dL — ABNORMAL HIGH (ref 0.61–1.24)
GFR, EST NON AFRICAN AMERICAN: 53 mL/min — AB (ref >60)
Glucose, Bld: 116 mg/dL — ABNORMAL HIGH (ref 65–99)
POTASSIUM: 3.9 mmol/L (ref 3.5–5.1)
SODIUM: 140 mmol/L (ref 135–145)

## 2015-08-15 LAB — CULTURE, BLOOD (ROUTINE X 2)
Culture: NO GROWTH
Culture: NO GROWTH

## 2015-08-15 SURGERY — WOUND EXPLORATION
Anesthesia: General | Laterality: Right

## 2015-08-15 MED ORDER — GLYCOPYRROLATE 0.2 MG/ML IJ SOLN
INTRAMUSCULAR | Status: DC | PRN
  Administered 2015-08-15: 0.2 mg via INTRAVENOUS

## 2015-08-15 MED ORDER — HYDROMORPHONE HCL 1 MG/ML IJ SOLN: 0.2500 mg | INTRAMUSCULAR | Status: DC | PRN

## 2015-08-15 MED ORDER — 0.9 % SODIUM CHLORIDE (POUR BTL) OPTIME
TOPICAL | Status: DC | PRN
  Administered 2015-08-15 (×2): 1000 mL

## 2015-08-15 MED ORDER — ROCURONIUM BROMIDE 50 MG/5ML IV SOLN
INTRAVENOUS | Status: AC
  Filled 2015-08-15: qty 1

## 2015-08-15 MED ORDER — HYDROMORPHONE HCL 2 MG/ML IJ SOLN
2.0000 mg | Freq: Once | INTRAMUSCULAR | Status: AC
  Administered 2015-08-15: 2 mg via INTRAVENOUS
  Filled 2015-08-15: qty 1

## 2015-08-15 MED ORDER — PROPOFOL 10 MG/ML IV BOLUS
INTRAVENOUS | Status: AC
  Filled 2015-08-15: qty 20

## 2015-08-15 MED ORDER — SUCCINYLCHOLINE CHLORIDE 20 MG/ML IJ SOLN
INTRAMUSCULAR | Status: DC | PRN
  Administered 2015-08-15: 80 mg via INTRAVENOUS

## 2015-08-15 MED ORDER — MIDAZOLAM HCL 5 MG/5ML IJ SOLN
INTRAMUSCULAR | Status: DC | PRN
  Administered 2015-08-15: 1 mg via INTRAVENOUS

## 2015-08-15 MED ORDER — HYDROMORPHONE HCL 1 MG/ML IJ SOLN
1.0000 mg | Freq: Once | INTRAMUSCULAR | Status: AC
  Administered 2015-08-15: 1 mg via INTRAVENOUS
  Filled 2015-08-15: qty 1

## 2015-08-15 MED ORDER — ONDANSETRON HCL 4 MG/2ML IJ SOLN
INTRAMUSCULAR | Status: AC
  Filled 2015-08-15: qty 2

## 2015-08-15 MED ORDER — CEFAZOLIN SODIUM-DEXTROSE 2-4 GM/100ML-% IV SOLN
INTRAVENOUS | Status: AC
Start: 2015-08-15 — End: 2015-08-15
  Filled 2015-08-15: qty 100

## 2015-08-15 MED ORDER — CEFAZOLIN SODIUM-DEXTROSE 2-3 GM-% IV SOLR
INTRAVENOUS | Status: DC | PRN
  Administered 2015-08-15: 2 g via INTRAVENOUS

## 2015-08-15 MED ORDER — FENTANYL CITRATE (PF) 250 MCG/5ML IJ SOLN
INTRAMUSCULAR | Status: AC
  Filled 2015-08-15: qty 5

## 2015-08-15 MED ORDER — EPHEDRINE SULFATE 50 MG/ML IJ SOLN
INTRAMUSCULAR | Status: DC | PRN
  Administered 2015-08-15: 10 mg via INTRAVENOUS

## 2015-08-15 MED ORDER — MIDAZOLAM HCL 2 MG/2ML IJ SOLN
INTRAMUSCULAR | Status: AC
  Filled 2015-08-15: qty 2

## 2015-08-15 MED ORDER — LACTATED RINGERS IV SOLN
INTRAVENOUS | Status: DC | PRN
  Administered 2015-08-15 (×2): via INTRAVENOUS

## 2015-08-15 MED ORDER — GABAPENTIN 100 MG PO CAPS
200.0000 mg | ORAL_CAPSULE | Freq: Three times a day (TID) | ORAL | Status: DC
  Administered 2015-08-15 (×2): 200 mg via ORAL
  Filled 2015-08-15 (×6): qty 2

## 2015-08-15 MED ORDER — LIDOCAINE HCL (CARDIAC) 20 MG/ML IV SOLN
INTRAVENOUS | Status: DC | PRN
  Administered 2015-08-15: 50 mg via INTRAVENOUS

## 2015-08-15 MED ORDER — ONDANSETRON HCL 4 MG/2ML IJ SOLN
4.0000 mg | Freq: Once | INTRAMUSCULAR | Status: AC
  Administered 2015-08-15: 4 mg via INTRAVENOUS
  Filled 2015-08-15: qty 2

## 2015-08-15 MED ORDER — PROPOFOL 10 MG/ML IV BOLUS
INTRAVENOUS | Status: DC | PRN
  Administered 2015-08-15: 160 mg via INTRAVENOUS

## 2015-08-15 MED ORDER — LIDOCAINE HCL (CARDIAC) 20 MG/ML IV SOLN
INTRAVENOUS | Status: AC
  Filled 2015-08-15: qty 5

## 2015-08-15 MED ORDER — ONDANSETRON HCL 4 MG/2ML IJ SOLN
INTRAMUSCULAR | Status: DC | PRN
  Administered 2015-08-15: 4 mg via INTRAVENOUS

## 2015-08-15 SURGICAL SUPPLY — 35 items
BAG ZIPLOCK 12X15 (MISCELLANEOUS) ×2 IMPLANT
BANDAGE ESMARK 6X9 LF (GAUZE/BANDAGES/DRESSINGS) ×1 IMPLANT
BNDG ESMARK 6X9 LF (GAUZE/BANDAGES/DRESSINGS) ×2
BNDG GAUZE ELAST 4 BULKY (GAUZE/BANDAGES/DRESSINGS) ×2 IMPLANT
CORDS BIPOLAR (ELECTRODE) ×2 IMPLANT
CUFF TOURN SGL QUICK 18 (TOURNIQUET CUFF) ×2 IMPLANT
CUFF TOURN SGL QUICK 24 (TOURNIQUET CUFF)
CUFF TOURN SGL QUICK 34 (TOURNIQUET CUFF)
CUFF TRNQT CYL 24X4X40X1 (TOURNIQUET CUFF) IMPLANT
CUFF TRNQT CYL 34X4X40X1 (TOURNIQUET CUFF) IMPLANT
DRAIN PENROSE 18X1/2 LTX STRL (DRAIN) ×2 IMPLANT
DRAPE INCISE IOBAN 66X45 STRL (DRAPES) ×2 IMPLANT
DRSG PAD ABDOMINAL 8X10 ST (GAUZE/BANDAGES/DRESSINGS) ×4 IMPLANT
DRSG VAC ATS SM SENSATRAC (GAUZE/BANDAGES/DRESSINGS) ×2 IMPLANT
DURAPREP 26ML APPLICATOR (WOUND CARE) ×2 IMPLANT
ELECT REM PT RETURN 9FT ADLT (ELECTROSURGICAL) ×2
ELECTRODE REM PT RTRN 9FT ADLT (ELECTROSURGICAL) ×1 IMPLANT
GAUZE SPONGE 4X4 12PLY STRL (GAUZE/BANDAGES/DRESSINGS) ×2 IMPLANT
GLOVE BIO SURGEON STRL SZ7.5 (GLOVE) ×2 IMPLANT
GLOVE BIO SURGEON STRL SZ8 (GLOVE) ×2 IMPLANT
GLOVE ECLIPSE 7.5 STRL STRAW (GLOVE) ×2 IMPLANT
GLOVE EUDERMIC 7 POWDERFREE (GLOVE) ×2 IMPLANT
HANDPIECE INTERPULSE COAX TIP (DISPOSABLE) ×1
KIT BASIN OR (CUSTOM PROCEDURE TRAY) ×2 IMPLANT
MANIFOLD NEPTUNE II (INSTRUMENTS) ×2 IMPLANT
PACK ORTHO EXTREMITY (CUSTOM PROCEDURE TRAY) ×2 IMPLANT
PAD CAST 4YDX4 CTTN HI CHSV (CAST SUPPLIES) ×1 IMPLANT
PADDING CAST COTTON 4X4 STRL (CAST SUPPLIES) ×1
POSITIONER SURGICAL ARM (MISCELLANEOUS) ×4 IMPLANT
SET HNDPC FAN SPRY TIP SCT (DISPOSABLE) ×1 IMPLANT
SUT NYLON 3 0 (SUTURE) ×4 IMPLANT
SWAB COLLECTION DEVICE MRSA (MISCELLANEOUS) ×2 IMPLANT
SWAB CULTURE ESWAB REG 1ML (MISCELLANEOUS) ×2 IMPLANT
SYR CONTROL 10ML LL (SYRINGE) ×2 IMPLANT
TUBE ANAEROBIC SPECIMEN COL (MISCELLANEOUS) ×2 IMPLANT

## 2015-08-15 NOTE — Anesthesia Postprocedure Evaluation (Signed)
Anesthesia Post Note  Patient: Benjamin HaagensenJohn Holben  Procedure(s) Performed: Procedure(s) (LRB): WOUND EXPLORATION right forearm (Right) IRRIGATION AND DEBRIDEMENT WOUND (Right)  Patient location during evaluation: PACU Anesthesia Type: General Level of consciousness: awake Vital Signs Assessment: post-procedure vital signs reviewed and stable Respiratory status: spontaneous breathing Cardiovascular status: stable Anesthetic complications: no    Last Vitals:  Filed Vitals:   08/15/15 2245 08/15/15 2315  BP: 172/93   Pulse: 72   Temp: 36.4 C   Resp: 12 10    Last Pain:  Filed Vitals:   08/15/15 2320  PainSc: 10-Worst pain ever                 EDWARDS,Kedric Bumgarner

## 2015-08-15 NOTE — Progress Notes (Signed)
D: Patient continues to complain of severe pain due to compression bandage on right arm.  He states he did not sleep well and woke several times due to pain.  Patient had to wait on pain medication last night because the power was out and meds could not be pulled from the pyxis.  Patient asked this nurse if his pain medication could be increased.  Informed him that he will need to review that with the provider that sees him.  He denies SI/HI/AVH.  He continues to ask for "someone to look at my arm."  Informed patient that he will need to follow up with his ortho doctor to have sutures removed at the end of the week.  Patient has been given this information. A: Continue to monitor medication management and MD orders.  Safety checks completed every 15 minutes per protocol.  Offer support and encouragement as needed. R: Patient is receptive to staff; his behavior is appropriate.

## 2015-08-15 NOTE — Progress Notes (Signed)
Met pt in pre-op a few minutes prior to surgery. He shared with me that he was depressed, he felt ashamed. He said his mother passed Dec 15 of cxr and he was her caregiver. He said other family members and her husband abandoned her. He said his grandmother died in April 26, 2022. We talked about the complications of his grief and he said he wants help. He said he tried cocaine and weed a couple weeks ago but it did not help how he was feeling. He said he cut his arm (pointing to his left arm) and then he said he decided he didn't want to live any more and cut his left arm (showing me his major laceration on his rt arm). Pt said he was hoping he would bleed to death. Pt said he is a pt at [facility] and they brought him here tonight because of the pain in his arm. Pt asked [place] to call his fiance in [place]. Her name is Altha Harm and her number is [phone]. She would like an update when Mr. Bantz is out of surgery. She described to me some of the same stories Mr. Deshler shared. She further stated that she is moving her June 1, which he had already shared. She described his plan of care to me and wanted to know how he may get assistance. I encouraged her to contact CSW after he is out of surgery.  Mr. Broner said he wanted Coleman help. I will refer him to Cloria Spring, staff [facility]. Please page if additional support is needed prior to Matt's visit. Chaplain Ernest Haber, M.Div.   08/15/15 04-26-2098  Clinical Encounter Type  Visited With Patient

## 2015-08-15 NOTE — Op Note (Signed)
08/13/2015 - 08/15/2015  10:38 PM  PATIENT:   Benjamin [name]  50 y.o. male  PRE-OPERATIVE DIAGNOSIS:  compartment syndrome right forearm  POST-OPERATIVE DIAGNOSIS:  same  PROCEDURE:  Evacuation hematoma right forearm, exploration, irrigation, and debridement forearm  SURGEON:  Zaryah Seckel, Vania ReaKevin M. M.D.  ASSISTANTS: Shuford pac   ANESTHESIA:   GET + ISB  EBL: min  SPECIMEN:  Wound cultures  Drains: VAC   PATIENT DISPOSITION:  PACU - hemodynamically stable.    PLAN OF CARE: Admit to inpatient   Will contact Dr. Amanda PeaGramig in am for further treatment recommendations  Dictation# [id number]   Contact # [phone]

## 2015-08-15 NOTE — Progress Notes (Signed)
[facility] MD Progress Note  08/15/2015 9:59 AM Jin Capote  MRN:  [id number]   Subjective:  Patient states that he is feeling okay.  Patient seen by this provider, chart reviewed with nursing.  On evaluation:  Gunter Conde reports that he woke several time during the night related to arm pain.  "I'm not feeling to good today.  I need somebody to raise my pain medication, give me something to help me sleep better, and I want some body to look at my arm this thing (referring to wrap) is contributing to my pain."  Discussed with patient that dressing could not be removed and that there would be a follow up.  Also discussed pain medication that he is getting it every 4 hours and would not increase dosage.  Informed that would increase Neurontin to 200 mg Tid.  States that he ate without difficulty; tolerating medications without adverse reaction; and that he is attending/participating in group sessions.  Reports that he got a lot out of the AA meeting.  At this time patient continues report depression but improving.  He denies suicidal thoughts at this time.  He also denies homicidal thoughts, psychosis, and paranoia. "I had everything going on and still decided to take my life.  I just needed a break.  I wasn't high when I did this."    Principal Problem: MDD (major depressive disorder), single episode, severe , no psychosis (HCC) Diagnosis:   Patient Active Problem List   Diagnosis Date Noted  . MDD (major depressive disorder), single episode, severe , no psychosis (HCC) [F32.2] 08/14/2015  . Cocaine use disorder, mild, abuse [F14.10] 08/14/2015  . Cannabis use disorder, mild, abuse [F12.10] 08/14/2015  . Opioid use disorder, mild, abuse [F11.10] 08/14/2015  . Suicide attempt (HCC) [T14.91] 08/10/2015  . Laceration of right arm with complication [S41.111A] 08/10/2015  . Uncontrolled hypertension [I10] 08/10/2015  . Dehydration [E86.0] 08/10/2015  . Weight loss, non-intentional [R63.4] 08/10/2015   . Depression [F32.9] 08/10/2015  . Essential hypertension [I10]   . Vaccine refused by patient [Z28.20] 05/24/2015  . Routine general medical examination at a health care facility [Z00.00] 05/24/2015  . Chronic back pain [M54.9, G89.29] 05/24/2015  . Abnormal serum creatinine level [R79.9] 05/24/2015  . Family history of cancer [Z80.9] 05/24/2015  . Screening for prostate cancer [Z12.5] 05/24/2015  . Smoker [Z72.0] 05/24/2015  . Cough [R05] 05/24/2015  . Screen for STD (sexually transmitted disease) [Z11.3] 05/24/2015  . Erectile dysfunction [N52.9] 01/31/2014   Total Time spent with patient: 30 minutes  Past Psychiatric History: Depression. One incident of self injurious behavior (cutting requiring sutures) 4/14 and current incident of trying to kill himself by cutting his right arm.   Past Medical History:  Past Medical History  Diagnosis Date  . Erectile dysfunction   . Chronic back pain     managed by [facility]    Past Surgical History  Procedure Laterality Date  . Ankle surgery      right  . Hand surgery      fracture, ORIG, teenage years, right.  . Lumbar epidural injection      [facility]  . Tendon repair N/A 07/24/2015    Procedure: WRIST LACERATION AND TENDON REPAIR;  Surgeon: Bradly Bienenstock, MD;  Location: [facility] OR;  Service: Orthopedics;  Laterality: N/A;  . I&d extremity Right 08/10/2015    Procedure: IRRIGATION AND DEBRIDEMENT SKIN, SUBCUTANEOS TISSUE AND  MUSCLE, CARPAL TUNNEL RELEASE, MEDIAN NERVE LYSIS WITH NERVE WRAP;  Surgeon: Dominica SeverinWilliam Gramig, MD;  [facility];  Service: Orthopedics;  Laterality: Right;   Family History:  Family History  Problem Relation Age of Onset  . Cancer Mother     lung  . Anxiety disorder Mother   . Cancer Father     throat  . Diabetes Paternal Grandmother   . Heart disease Neg Hx   . Stroke Neg Hx    Family Psychiatric  History: Mother/Depression Social History:  History  Alcohol Use  .  Yes    Comment: occasional     History  Drug Use  . Yes  . Special: Marijuana, Cocaine    Comment: last use cocaine 08/08/15, last use MJ 08/08/15    Social History   Social History  . Marital Status: Single    Spouse Name: N/A  . Number of Children: N/A  . Years of Education: N/A   Social History Main Topics  . Smoking status: Current Every Day Smoker -- 0.50 packs/day for 6 years    Types: Cigarettes  . Smokeless tobacco: None  . Alcohol Use: Yes     Comment: occasional  . Drug Use: Yes    Special: Marijuana, Cocaine     Comment: last use cocaine 08/08/15, last use MJ 08/08/15  . Sexual Activity: Not Asked   Other Topics Concern  . 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   No children   Additional Social History:   Sleep: Fair, related to pain  Appetite:  Good  Current Medications: Current Facility-Administered Medications  Medication Dose Route Frequency Provider Last Rate Last Dose  . acetaminophen (TYLENOL) tablet 650 mg  650 mg Oral Q6H PRN Thermon LeylandLaura A Davis, NP      . alum & mag hydroxide-simeth (MAALOX/MYLANTA) 200-200-20 MG/5ML suspension 30 mL  30 mL Oral Q4H PRN Thermon LeylandLaura A Davis, NP      . chlordiazePOXIDE (LIBRIUM) capsule 25 mg  25 mg Oral Q6H PRN Shuvon B Rankin, NP      . chlordiazePOXIDE (LIBRIUM) capsule 25 mg  25 mg Oral TID Shuvon B Rankin, NP       Followed by  . [START ON 08/16/2015] chlordiazePOXIDE (LIBRIUM) capsule 25 mg  25 mg Oral BH-qamhs Shuvon B Rankin, NP       Followed by  . [START ON 08/18/2015] chlordiazePOXIDE (LIBRIUM) capsule 25 mg  25 mg Oral Daily Shuvon B Rankin, NP      . ciprofloxacin (CIPRO) tablet 500 mg  500 mg Oral BID Shuvon B Rankin, NP   500 mg at 08/15/15 0756  . docusate sodium (COLACE) capsule 100 mg  100 mg Oral BID Thermon LeylandLaura A Davis, NP   100 mg at 08/15/15 0755  . DULoxetine (CYMBALTA) DR capsule 30 mg  30 mg Oral Daily Thermon LeylandLaura A Davis, NP   30 mg at  08/15/15 0756  . feeding supplement (ENSURE ENLIVE) (ENSURE ENLIVE) liquid 237 mL  237 mL Oral BID BM Thermon LeylandLaura A Davis, NP   237 mL at 08/15/15 0800  . folic acid (FOLVITE) tablet 1 mg  1 mg Oral Daily Thermon LeylandLaura A Davis, NP   1 mg at 08/15/15 0756  . gabapentin (NEURONTIN) capsule 200 mg  200 mg Oral TID Shuvon B Rankin, NP      . hydrOXYzine (ATARAX/VISTARIL) tablet 25 mg  25 mg Oral Q6H PRN Shuvon B Rankin, NP   25 mg at 08/14/15 1817  . loperamide (  IMODIUM) capsule 2-4 mg  2-4 mg Oral PRN Shuvon B Rankin, NP      . magnesium hydroxide (MILK OF MAGNESIA) suspension 30 mL  30 mL Oral Daily PRN Thermon Leyland, NP      . methocarbamol (ROBAXIN) tablet 500 mg  500 mg Oral Q6H PRN Shuvon B Rankin, NP   500 mg at 08/14/15 1817  . multivitamin with minerals tablet 1 tablet  1 tablet Oral Daily Thermon Leyland, NP   1 tablet at 08/15/15 0756  . nicotine (NICODERM CQ - dosed in mg/24 hours) patch 21 mg  21 mg Transdermal Daily Shuvon B Rankin, NP   21 mg at 08/15/15 0802  . ondansetron (ZOFRAN-ODT) disintegrating tablet 4 mg  4 mg Oral Q6H PRN Shuvon B Rankin, NP      . oxyCODONE (Oxy IR/ROXICODONE) immediate release tablet 10 mg  10 mg Oral Q4H PRN Adonis Brook, NP   10 mg at 08/15/15 0755  . polyethylene glycol (MIRALAX / GLYCOLAX) packet 17 g  17 g Oral Daily PRN Thermon Leyland, NP      . thiamine (VITAMIN B-1) tablet 100 mg  100 mg Oral Daily Thermon Leyland, NP   100 mg at 08/15/15 0756  . traZODone (DESYREL) tablet 50 mg  50 mg Oral QHS Thermon Leyland, NP   50 mg at 08/14/15 2205    Lab Results: No results found for this or any previous visit (from the past 48 hour(s)).  Blood Alcohol level:  Lab Results  Component Value Date   ETH <5 08/10/2015    Physical Findings: AIMS: Facial and Oral Movements Muscles of Facial Expression: None, normal Lips and Perioral Area: None, normal Jaw: None, normal Tongue: None, normal,Extremity Movements Upper (arms, wrists, hands, fingers): None, normal Lower (legs,  knees, ankles, toes): None, normal, Trunk Movements Neck, shoulders, hips: None, normal, Overall Severity Severity of abnormal movements (highest score from questions above): None, normal Incapacitation due to abnormal movements: None, normal Patient's awareness of abnormal movements (rate only patient's report): No Awareness, Dental Status Current problems with teeth and/or dentures?: No Does patient usually wear dentures?: No  CIWA:  CIWA-Ar Total: 4 COWS:     Musculoskeletal: Strength & Muscle Tone: within normal limits Gait & Station: normal Patient leans: N/A  Psychiatric Specialty Exam: Review of Systems  Gastrointestinal: Positive for diarrhea.  Musculoskeletal: Positive for back pain.        Complaints of pain right arm. Wound covered; and also left wrist   Skin:        Has old laceration on his left wrist. Scabbed over no s/s of infection noted.   Psychiatric/Behavioral: Positive for depression and substance abuse. Suicidal ideas: Denies at this time. The patient is nervous/anxious and has insomnia.   All other systems reviewed and are negative.   Blood pressure 136/69, pulse 63, temperature 98.9 F (37.2 C), temperature source Oral, resp. rate 16, height 6' (1.829 m), weight 82.555 kg (182 lb), SpO2 99 %.Body mass index is 24.68 kg/(m^2).  General Appearance: Casual and Fairly Groomed  Eye Contact::  Good  Speech:  Clear and Coherent and Normal Rate  Volume:  Normal  Mood:  Depressed  Affect:  Depressed  Thought Process:  Circumstantial, Goal Directed and Irrelevant  Orientation:  Full (Time, Place, and Person)  Thought Content:  Rumination  Suicidal Thoughts:  Denies at this time  Homicidal Thoughts:  No  Memory:  Immediate;   Good Recent;   Good  Remote;   Good  Judgement:  Fair  Insight:  Lacking  Psychomotor Activity:  Normal  Concentration:  Fair  Recall:  Good  Fund of Knowledge:Good  Language: Good  Akathisia:  No  Handed:  Right  AIMS (if  indicated):     Assets:  Communication Skills Desire for Improvement Housing Social Support  ADL's:  Intact  Cognition: WNL  Sleep:  Number of Hours: 5.75   Treatment Plan Summary: Daily contact with patient to assess and evaluate symptoms and progress in treatment and Medication management  Plan: CSW will start working on disposition.  Patient to participate in therapeutic milieu .  Medication management:   Medication management:  Major Depressive Disorder: Cymbalta DR 30 mg daily; Insomnia: Trazodone 50 mg Q hs prn Pain: Oxycodone 10 mg Q 4 hr prn; Muscle spasm: Robaxin 500 mg a 6 hr prn Agitation/Neuropathic pain:  Increased Neurontin 200 mg Tid Alcohol detox/withdrawal: Librium detox protocol Constipation: Colace 100 mg bid. Discontinued Dulcolax.  Bacterial Infection(arm): Cipro 500 mg Bid (started 4/20) continue   Social Work will continue to work with patient and assist in helping patient with :  [facility]) Recommendations for Outpatient Follow-up:  1. Crosby Oyster, PA-C/PCP in 2 weeks, upon discharge from [facility] To be seen with repeat labs (CBC & BMP). 2. Dr. Dominica Severin, Orthopedics/Hand Surgeon: To be seen by end of next week for postop wound check, suture removal and further recommendations. If patient remains at the [facility] during that time, recommend that his care be discussed with Dr. Amanda Pea. 3. Patient will need prescriptions for his antibiotics (remainder of dose), pain medications and newly started psychiatric medications upon discharge from [facility]. 4. Recommend repeating CBC and BMP at [facility] in 48-72 hours to ensure stability. 5. Please follow final blood culture results that were sent from [facility].   Assunta Found, NP 08/15/2015, 9:59 AM

## 2015-08-15 NOTE — Anesthesia Preprocedure Evaluation (Signed)
Anesthesia Evaluation  Patient identified by MRN, date of birth, ID band Patient awake    Reviewed: Allergy & Precautions, NPO status , Patient's Chart, lab work & pertinent test results  Airway Mallampati: II  TM Distance: >3 FB Neck ROM: Full    Dental   Pulmonary Current Smoker,    breath sounds clear to auscultation       Cardiovascular hypertension,  Rhythm:Regular Rate:Normal     Neuro/Psych History noted. CE   GI/Hepatic negative GI ROS, Neg liver ROS,   Endo/Other  negative endocrine ROS  Renal/GU negative Renal ROS     Musculoskeletal   Abdominal   Peds  Hematology   Anesthesia Other Findings   Reproductive/Obstetrics                             Anesthesia Physical Anesthesia Plan  ASA: III  Anesthesia Plan:    Post-op Pain Management:    Induction: Intravenous, Rapid sequence and Cricoid pressure planned  Airway Management Planned: Oral ETT  Additional Equipment:   Intra-op Plan:   Post-operative Plan: Extubation in OR  Informed Consent: I have reviewed the patients History and Physical, chart, labs and discussed the procedure including the risks, benefits and alternatives for the proposed anesthesia with the patient or authorized representative who has indicated his/her understanding and acceptance.   Dental advisory given  Plan Discussed with: CRNA and Anesthesiologist  Anesthesia Plan Comments:         Anesthesia Quick Evaluation

## 2015-08-15 NOTE — ED Notes (Signed)
[facility] pain (10/10)  related to self inflicted laceration in Aug 10, 2015. Surgical intervention done by Dr. Amanda PeaGramig. Patient states pain has gotten severely worse despite pain interventions.

## 2015-08-15 NOTE — Progress Notes (Signed)
[facility], along with [facility] and [employer]. [facility]. [facility]. Pt c/o not being able to move right hand fingers. Fingers painful to touch and constant burning sensation. Oxy 10 mg given at 1643, not effective. Pt capillary refill to right hand fingers less than 2 sec. [facility] prior to transfer. Consulting civil engineerCharge RN., was unavailable to take report due to [facility].

## 2015-08-15 NOTE — Anesthesia Procedure Notes (Signed)
Procedure Name: Intubation Performed by: Benjamin [name] Pre-anesthesia Checklist: Patient identified, Emergency Drugs available, Suction available, Patient being monitored and Timeout performed Patient Re-evaluated:Patient Re-evaluated prior to inductionOxygen Delivery Method: Circle system utilized Preoxygenation: Pre-oxygenation with 100% oxygen (RSI) Intubation Type: IV induction Laryngoscope Size: Mac and 4 Grade View: Grade I Tube type: Oral Tube size: 7.5 mm Number of attempts: 1 Airway Equipment and Method: Stylet Placement Confirmation: ETT inserted through vocal cords under direct vision,  positive ETCO2,  CO2 detector and breath sounds checked- equal and bilateral Secured at: 23 cm Tube secured with: Tape Dental Injury: Teeth and Oropharynx as per pre-operative assessment      

## 2015-08-15 NOTE — ED Provider Notes (Signed)
CSN: [id number]     Arrival date & time 08/15/15  1840 History   By signing my name below, I, Arlan Organshley Leger, attest that this documentation has been prepared under the direction and in the presence of Kawhi Diebold Y Deshondra Worst, [place]-C.  Electronically Signed: Arlan OrganAshley Leger, ED Scribe. 08/15/2015. 7:42 PM.   Chief Complaint  Patient presents with  . Extremity Laceration    5/16 self inflicited Right forearm laceration seen by Dr. Amanda PeaGramig -- presents [facility] servere painto rt arm  [facility]    [facility]PI  HPI Comments: [name] a 50 y.o. male without any pertinent past medical history who presents to the Emergency Department complaining of constant, ongoing, worsening R arm pain x 2-3 hours. Currently pain is rated 10/10. Intermittent numbness also reports to R hand. Pt states "its hard for me to move my fingers". Discomfort is secondary to a self inflicted laceration sustained on 08/10/15. Debridement, I&D, and complex repair performed in OR to extremity on 08/10/15 by Dr. Dominica SeverinWilliam Gramig. Pt denies any new injury or trauma to arm. He denies any discomfort  since time of surgery until today. However, pt states "something didn't feel right this afternoon". States he has had progressively increasing severe pain. He states now he cannot move his fingers and states his sensation feels different. Currently pt is still on Cipro. Oxycodone given 1 hour prior to arrival without any improvement. No recent fever or chills. No known allergies to medications.  PCP: Ernst BreachYSINGER, DAVID SHANE, PA-C    Past Medical History  Diagnosis Date  . Erectile dysfunction   . Chronic back pain     [facility]   Past Surgical History  Procedure Laterality Date  . Ankle surgery      right  . Hand surgery      fracture, ORIG, teenage years, right.  . Lumbar epidural injection      [facility]  . Tendon repair N/A 07/24/2015    Procedure: WRIST LACERATION AND  TENDON REPAIR;  Surgeon: Bradly BienenstockFred Ortmann, MD;  Location: [facility] OR;  Service: Orthopedics;  Laterality: N/A;  . I&d extremity Right 08/10/2015    Procedure: IRRIGATION AND DEBRIDEMENT SKIN, SUBCUTANEOS TISSUE AND  MUSCLE, CARPAL TUNNEL RELEASE, MEDIAN NERVE LYSIS WITH NERVE WRAP;  Surgeon: Dominica SeverinWilliam Gramig, MD;  Location: [facility] OR;  Service: Orthopedics;  Laterality: Right;   Family History  Problem Relation Age of Onset  . Cancer Mother     lung  . Anxiety disorder Mother   . Cancer Father     throat  . Diabetes Paternal Grandmother   . Heart disease Neg Hx   . Stroke Neg Hx    Social History  Substance Use Topics  . Smoking status: Current Every Day Smoker -- 0.50 packs/day for 6 years    Types: Cigarettes  . Smokeless tobacco: None  . Alcohol Use: Yes     Comment: occasional    Review of Systems  A complete 10 system review of systems was obtained and all systems are negative except as noted in the HPI and PMH.    Allergies  Review of patient's allergies indicates no known allergies.  Home Medications   Prior to Admission medications   Medication Sig Start Date End Date Taking? Authorizing Provider  HYDROcodone-acetaminophen (NORCO) 10-325 MG tablet Take 1 tablet by mouth 4 (four) times daily as needed for moderate pain.  08/03/15  Yes Historical Provider, MD  ciprofloxacin (  CIPRO) 500 MG tablet Take 1 tablet (500 mg total) by mouth 2 (two) times daily. For 2 weeks beginning 08/13/15. 08/13/15   Elease Etienne, MD  docusate sodium (COLACE) 100 MG capsule Take 1 capsule (100 mg total) by mouth 2 (two) times daily. Patient not taking: Reported on 08/13/2015 07/24/15   Bradly Bienenstock, MD  DULoxetine (CYMBALTA) 30 MG capsule Take 1 capsule (30 mg total) by mouth daily. 08/13/15   Elease Etienne, MD  folic acid (FOLVITE) 1 MG tablet Take 1 tablet (1 mg total) by mouth daily. 08/13/15   Elease Etienne, MD  methocarbamol (ROBAXIN) 500 MG tablet Take 1 tablet (500 mg total) by mouth every 6 (six)  hours as needed for muscle spasms. 08/13/15   Elease Etienne, MD  Multiple Vitamin (MULTIVITAMIN WITH MINERALS) TABS tablet Take 1 tablet by mouth daily. 08/13/15   Elease Etienne, MD  oxyCODONE-acetaminophen (PERCOCET) 10-325 MG tablet  07/24/15   Historical Provider, MD  thiamine 100 MG tablet Take 1 tablet (100 mg total) by mouth daily. 08/13/15   Elease Etienne, MD  traZODone (DESYREL) 50 MG tablet Take 1 tablet (50 mg total) by mouth at bedtime. 08/13/15   Elease Etienne, MD  VIAGRA 100 MG tablet TK 1/2 TO 1 T PO QD PRF ERECTILE DYSFUNCTION 05/24/15   Historical Provider, MD   Triage Vitals: BP 130/85 mmHg  Pulse 72  Temp(Src) 97.6 F (36.4 C) (Oral)  Resp 20  Ht  (1.88 m)  Wt 182 lb (82.555 kg)  BMI 24.68 kg/m2  SpO2 100%   Physical Exam  Constitutional: He is oriented to person, place, and time. He appears well-developed and well-nourished.  HENT:  Head: Normocephalic.  Eyes: EOM are normal.  Neck: Normal range of motion.  Pulmonary/Chest: Effort normal.  Abdominal: He exhibits no distension.  Musculoskeletal: Normal range of motion.  Right arm splint removed. Right forearm is swollen and tight. Diffusely tender. There is minimal serosanguinous drainage from the incision site but sutures are intact. Radial pulse is barely palpable. Pt unable to flex or extend fingers or right wrist. Sensation intact to light touch though pt states it feels different than normal. Skin is pale.   Neurological: He is alert and oriented to person, place, and time.  Psychiatric: He has a normal mood and affect.  Nursing note and vitals reviewed.   ED Course  Procedures (including critical care time)  DIAGNOSTIC STUDIES: Oxygen Saturation is 100% on RA, Normal by my interpretation.    COORDINATION OF CARE: 7:23 PM- Will order blood work. Will give pain medication. Discussed treatment plan with pt at bedside and pt agreed to plan.     Labs Review Labs Reviewed  BASIC METABOLIC PANEL  - Abnormal; Notable for the following:    Glucose, Bld 116 (*)    BUN 24 (*)    Creatinine, Ser 1.52 (*)    GFR calc non Af Amer 53 (*)    All other components within normal limits  CBC WITH DIFFERENTIAL/PLATELET - Abnormal; Notable for the following:    RBC 3.90 (*)    Hemoglobin 10.0 (*)    HCT 30.6 (*)    MCH 25.6 (*)    All other components within normal limits  CBC WITH DIFFERENTIAL/PLATELET    Imaging Review No results found. I have personally reviewed and evaluated these images and lab results as part of my medical decision-making.   EKG Interpretation None  MDM   Final diagnoses:  None    Pt seen and evaluated with attending Dr. Verdie Mosher. He is a transfer from [facility]. There is concern for possible compartment syndrome at this time. Will page on-call for Dr. Amanda Pea.  I spoke to Dr. Rennis Chris from [facility] ortho. He is not on call for Dr. Carlos Levering hand patients but will review patient chart and call me back. Appreciate assistance.  Dr. Rennis Chris evaluated pt in the ED. Will bring to the OR.    I personally performed the services described in this documentation, which was scribed in my presence. The recorded information has been reviewed and is accurate.   Carlene Coria, PA-C 08/15/15 2127  Lavera Guise, MD 08/15/15 1610  Lavera Guise, MD 08/15/15 [phone]

## 2015-08-15 NOTE — ED Notes (Signed)
Last food and drink at 1230 today.

## 2015-08-15 NOTE — ED Notes (Signed)
Dr. Rennis ChrisSupple from orthopedics at bedside.

## 2015-08-15 NOTE — ED Notes (Signed)
Bed: [id number] Expected date: 08/15/15 Expected time:  Means of arrival:  Comments: BH-IVC-lac L forearm

## 2015-08-15 NOTE — ED Notes (Signed)
Provider at bedside requesting that patient be moved to the acute side.

## 2015-08-15 NOTE — ED Notes (Signed)
Bed: WA17 Expected date:  Expected time:  Means of arrival:  Comments: Hold for room 33

## 2015-08-15 NOTE — Transfer of Care (Signed)
Immediate Anesthesia Transfer of Care Note  Patient: Benjamin HaagensenJohn Lewan  Procedure(s) Performed: Procedure(s): WOUND EXPLORATION right forearm (Right) IRRIGATION AND DEBRIDEMENT WOUND (Right)  Patient Location: PACU  Anesthesia Type:General  Level of Consciousness: sedated, patient cooperative and responds to stimulation  Airway & Oxygen Therapy: Patient Spontanous Breathing and Patient connected to face mask oxygen  Post-op Assessment: Report given to RN and Post -op Vital signs reviewed and stable  Post vital signs: Reviewed and stable  Last Vitals:  Filed Vitals:   08/15/15 1850 08/15/15 2100  BP: 130/85 161/80  Pulse: 72 65  Temp: 36.4 C 36.7 C  Resp: 20 18    Last Pain:  Filed Vitals:   08/15/15 2101  PainSc: 10-Worst pain ever      Patients Stated Pain Goal: 8 (08/15/15 2050)  Complications: No apparent anesthesia complications

## 2015-08-15 NOTE — H&P (Signed)
Mady HaagensenJohn Hartinger    Chief Complaint: compartment syndrome HPI: The patient is a 50 y.o. male s/p suicide attempt with severe right forearm laceration, surgical exploration and repair by Dr. Amanda PeaGramig 08/10/15, d/cd to behavioral health, now returnns to ED with acute, severe right forearm pain and swelling  Past Medical History  Diagnosis Date  . Erectile dysfunction   . Chronic back pain     [facility]    Past Surgical History  Procedure Laterality Date  . Ankle surgery      right  . Hand surgery      fracture, ORIG, teenage years, right.  . Lumbar epidural injection      [facility]  . Tendon repair N/A 07/24/2015    Procedure: WRIST LACERATION AND TENDON REPAIR;  Surgeon: Bradly BienenstockFred Ortmann, MD;  Location: [facility] OR;  Service: Orthopedics;  Laterality: N/A;  . I&d extremity Right 08/10/2015    Procedure: IRRIGATION AND DEBRIDEMENT SKIN, SUBCUTANEOS TISSUE AND  MUSCLE, CARPAL TUNNEL RELEASE, MEDIAN NERVE LYSIS WITH NERVE WRAP;  Surgeon: Dominica SeverinWilliam Gramig, MD;  Location: [facility] OR;  Service: Orthopedics;  Laterality: Right;    Family History  Problem Relation Age of Onset  . Cancer Mother     lung  . Anxiety disorder Mother   . Cancer Father     throat  . Diabetes Paternal Grandmother   . Heart disease Neg Hx   . Stroke Neg Hx     Social History:  reports that he has been smoking Cigarettes.  He has a 3 pack-year smoking history. He does not have any smokeless tobacco history on file. He reports that he drinks alcohol. He reports that he uses illicit drugs (Marijuana and Cocaine).    (Not in a hospital admission)   Physical Exam: right forearm with diffuse swelling from elbow distally into hand and digits, excruciating pain witgh attempts at passive digital extension, scant bloody drainage from wound, multiple sutures intact. Brisk capillary refill, 1+ radial pulse, grossly intact to light touch sensation.  Vitals  Temp:  [97.6 F (36.4 C)-98.9 F  (37.2 C)] 97.6 F (36.4 C) (05/21 1850) Pulse Rate:  [57-72] 72 (05/21 1850) Resp:  [16-20] 20 (05/21 1850) BP: (130-143)/(69-85) 130/85 mmHg (05/21 1850) SpO2:  [98 %-100 %] 100 % (05/21 1850)  Assessment/Plan  Impression: compartment syndrome right forearm  Plan of Action: Procedure(s): WOUND EXPLORATION right forearm IRRIGATION AND DEBRIDEMENT WOUND  Lura Falor M Bridgit Eynon 08/15/2015, 9:00 PM Contact # [phone]

## 2015-08-15 NOTE — [facility] Group Notes (Signed)
[facility] Group Notes: (Clinical Social Work)   08/15/2015      Type of Therapy:  Group Therapy   Participation Level:  Did Not Attend despite [facility]HT prompting - was shaving, arrived 2 minutes before end of group   Ambrose MantleMareida Grossman-Orr, LCSW 08/15/2015, 12:20 PM

## 2015-08-15 NOTE — ED Notes (Signed)
Lab at bedside

## 2015-08-16 ENCOUNTER — Inpatient Hospital Stay (HOSPITAL_COMMUNITY)
Admission: AD | Admit: 2015-08-16 | Discharge: 2015-08-24 | DRG: 982 | Disposition: A | Discharge status: Other Acute Inpatient Hospital | Payer: BLUE CROSS/BLUE SHIELD | Source: Other Acute Inpatient Hospital | Attending: Orthopedic Surgery | Admitting: Orthopedic Surgery

## 2015-08-16 ENCOUNTER — Encounter (HOSPITAL_COMMUNITY): Payer: Self-pay | Admitting: *Deleted

## 2015-08-16 DIAGNOSIS — S51811A Laceration without foreign body of right forearm, initial encounter: Secondary | ICD-10-CM | POA: Diagnosis present

## 2015-08-16 DIAGNOSIS — X781XXA Intentional self-harm by knife, initial encounter: Secondary | ICD-10-CM | POA: Diagnosis present

## 2015-08-16 DIAGNOSIS — S5010XA Contusion of unspecified forearm, initial encounter: Secondary | ICD-10-CM | POA: Diagnosis present

## 2015-08-16 DIAGNOSIS — G8929 Other chronic pain: Secondary | ICD-10-CM | POA: Diagnosis present

## 2015-08-16 DIAGNOSIS — S5011XA Contusion of right forearm, initial encounter: Secondary | ICD-10-CM | POA: Diagnosis present

## 2015-08-16 DIAGNOSIS — M549 Dorsalgia, unspecified: Secondary | ICD-10-CM | POA: Diagnosis present

## 2015-08-16 DIAGNOSIS — Y838 Other surgical procedures as the cause of abnormal reaction of the patient, or of later complication, without mention of misadventure at the time of the procedure: Secondary | ICD-10-CM | POA: Diagnosis present

## 2015-08-16 DIAGNOSIS — I97621 Postprocedural hematoma of a circulatory system organ or structure following other procedure: Secondary | ICD-10-CM | POA: Diagnosis present

## 2015-08-16 DIAGNOSIS — F332 Major depressive disorder, recurrent severe without psychotic features: Secondary | ICD-10-CM | POA: Diagnosis not present

## 2015-08-16 MED ORDER — METHOCARBAMOL 500 MG PO TABS
500.0000 mg | ORAL_TABLET | Freq: Four times a day (QID) | ORAL | Status: DC | PRN
Start: 2015-08-16 — End: 2015-08-24
  Administered 2015-08-16 – 2015-08-23 (×13): 500 mg via ORAL
  Filled 2015-08-16 (×16): qty 1

## 2015-08-16 MED ORDER — NICOTINE 21 MG/24HR TD PT24
21.0000 mg | MEDICATED_PATCH | Freq: Every day | TRANSDERMAL | Status: DC
  Administered 2015-08-16 – 2015-08-24 (×9): 21 mg via TRANSDERMAL
  Filled 2015-08-16 (×9): qty 1

## 2015-08-16 MED ORDER — VITAMIN B-1 100 MG PO TABS
100.0000 mg | ORAL_TABLET | Freq: Every day | ORAL | Status: DC
  Administered 2015-08-16 – 2015-08-24 (×9): 100 mg via ORAL
  Filled 2015-08-16 (×7): qty 1

## 2015-08-16 MED ORDER — CIPROFLOXACIN HCL 500 MG PO TABS
500.0000 mg | ORAL_TABLET | Freq: Two times a day (BID) | ORAL | Status: DC
  Administered 2015-08-16 – 2015-08-17 (×3): 500 mg via ORAL
  Filled 2015-08-16 (×6): qty 1

## 2015-08-16 MED ORDER — LACTATED RINGERS IV SOLN
INTRAVENOUS | Status: DC
  Administered 2015-08-17 – 2015-08-18 (×2): via INTRAVENOUS
  Administered 2015-08-22: 1 via INTRAVENOUS

## 2015-08-16 MED ORDER — ONDANSETRON 4 MG PO TBDP
4.0000 mg | ORAL_TABLET | Freq: Four times a day (QID) | ORAL | Status: AC | PRN
  Filled 2015-08-16: qty 1

## 2015-08-16 MED ORDER — OXYCODONE HCL 5 MG PO TABS
10.0000 mg | ORAL_TABLET | ORAL | Status: DC | PRN
  Administered 2015-08-16 (×4): 10 mg via ORAL
  Filled 2015-08-16 (×4): qty 2

## 2015-08-16 MED ORDER — FOLIC ACID 1 MG PO TABS
1.0000 mg | ORAL_TABLET | Freq: Every day | ORAL | Status: DC
  Administered 2015-08-16 – 2015-08-24 (×9): 1 mg via ORAL
  Filled 2015-08-16 (×9): qty 1

## 2015-08-16 MED ORDER — HYDROXYZINE HCL 25 MG PO TABS: 25.0000 mg | ORAL_TABLET | Freq: Four times a day (QID) | ORAL | Status: AC | PRN

## 2015-08-16 MED ORDER — CHLORDIAZEPOXIDE HCL 25 MG PO CAPS: 25.0000 mg | ORAL_CAPSULE | Freq: Four times a day (QID) | ORAL | Status: AC | PRN

## 2015-08-16 MED ORDER — OXYCODONE HCL 5 MG PO TABS
5.0000 mg | ORAL_TABLET | ORAL | Status: DC | PRN
  Administered 2015-08-16 – 2015-08-19 (×15): 15 mg via ORAL
  Administered 2015-08-19: 5 mg via ORAL
  Administered 2015-08-19 – 2015-08-24 (×24): 15 mg via ORAL
  Filled 2015-08-16 (×11): qty 3
  Filled 2015-08-16: qty 1
  Filled 2015-08-16 (×5): qty 3
  Filled 2015-08-16: qty 2
  Filled 2015-08-16 (×5): qty 3
  Filled 2015-08-16: qty 1
  Filled 2015-08-16 (×4): qty 3
  Filled 2015-08-16: qty 2
  Filled 2015-08-16 (×14): qty 3

## 2015-08-16 MED ORDER — POLYETHYLENE GLYCOL 3350 17 G PO PACK: 17.0000 g | PACK | Freq: Every day | ORAL | Status: DC | PRN

## 2015-08-16 MED ORDER — WHITE PETROLATUM GEL
Status: AC
  Administered 2015-08-16: 0.2
  Filled 2015-08-16: qty 1

## 2015-08-16 MED ORDER — LOPERAMIDE HCL 2 MG PO CAPS: 2.0000 mg | ORAL_CAPSULE | ORAL | Status: AC | PRN

## 2015-08-16 MED ORDER — DULOXETINE HCL 30 MG PO CPEP
30.0000 mg | ORAL_CAPSULE | Freq: Every day | ORAL | Status: DC
  Administered 2015-08-16 – 2015-08-24 (×9): 30 mg via ORAL
  Filled 2015-08-16 (×9): qty 1

## 2015-08-16 MED ORDER — CEFAZOLIN SODIUM-DEXTROSE 2-4 GM/100ML-% IV SOLN
2.0000 g | Freq: Three times a day (TID) | INTRAVENOUS | Status: DC
  Administered 2015-08-16 – 2015-08-24 (×25): 2 g via INTRAVENOUS
  Filled 2015-08-16 (×31): qty 100

## 2015-08-16 MED ORDER — MAGNESIUM HYDROXIDE 400 MG/5ML PO SUSP: 30.0000 mL | Freq: Every day | ORAL | Status: DC | PRN

## 2015-08-16 MED ORDER — CHLORDIAZEPOXIDE HCL 25 MG PO CAPS
25.0000 mg | ORAL_CAPSULE | Freq: Two times a day (BID) | ORAL | Status: AC
  Administered 2015-08-16 (×2): 25 mg via ORAL
  Filled 2015-08-16 (×2): qty 1

## 2015-08-16 MED ORDER — GABAPENTIN 100 MG PO CAPS
200.0000 mg | ORAL_CAPSULE | Freq: Three times a day (TID) | ORAL | Status: DC
  Administered 2015-08-16 – 2015-08-24 (×25): 200 mg via ORAL
  Filled 2015-08-16 (×25): qty 2

## 2015-08-16 MED ORDER — CHLORDIAZEPOXIDE HCL 25 MG PO CAPS
25.0000 mg | ORAL_CAPSULE | Freq: Every day | ORAL | Status: AC
  Administered 2015-08-17: 25 mg via ORAL
  Filled 2015-08-16: qty 1

## 2015-08-16 MED ORDER — TRAZODONE HCL 50 MG PO TABS
50.0000 mg | ORAL_TABLET | Freq: Every day | ORAL | Status: DC
  Administered 2015-08-16 – 2015-08-23 (×8): 50 mg via ORAL
  Filled 2015-08-16 (×8): qty 1

## 2015-08-16 MED ORDER — ALUM & MAG HYDROXIDE-SIMETH 200-200-20 MG/5ML PO SUSP
30.0000 mL | ORAL | Status: DC | PRN
  Administered 2015-08-16 – 2015-08-20 (×7): 30 mL via ORAL
  Filled 2015-08-16 (×7): qty 30

## 2015-08-16 MED ORDER — ENSURE ENLIVE PO LIQD
237.0000 mL | Freq: Two times a day (BID) | ORAL | Status: DC
  Administered 2015-08-16 – 2015-08-24 (×14): 237 mL via ORAL

## 2015-08-16 MED ORDER — CEFAZOLIN SODIUM 1 G IJ SOLR
2.0000 g | Freq: Three times a day (TID) | INTRAMUSCULAR | Status: DC
  Filled 2015-08-16 (×4): qty 20

## 2015-08-16 MED ORDER — ACETAMINOPHEN 325 MG PO TABS
650.0000 mg | ORAL_TABLET | Freq: Four times a day (QID) | ORAL | Status: DC | PRN
  Administered 2015-08-16: 650 mg via ORAL
  Filled 2015-08-16: qty 2

## 2015-08-16 MED ORDER — DOCUSATE SODIUM 100 MG PO CAPS
100.0000 mg | ORAL_CAPSULE | Freq: Two times a day (BID) | ORAL | Status: DC
  Administered 2015-08-16 – 2015-08-24 (×10): 100 mg via ORAL
  Filled 2015-08-16 (×15): qty 1

## 2015-08-16 MED ORDER — CHLORDIAZEPOXIDE HCL 25 MG PO CAPS
25.0000 mg | ORAL_CAPSULE | Freq: Three times a day (TID) | ORAL | Status: AC
Start: 2015-08-16 — End: 2015-08-16

## 2015-08-16 MED ORDER — ADULT MULTIVITAMIN W/MINERALS CH
1.0000 | ORAL_TABLET | Freq: Every day | ORAL | Status: DC
  Administered 2015-08-16 – 2015-08-24 (×9): 1 via ORAL
  Filled 2015-08-16 (×9): qty 1

## 2015-08-16 NOTE — Progress Notes (Signed)
Patient ID: Benjamin HaagensenJohn Tiso, male   DOB: Mar 07, 1966, 50 y.o.   MRN: [id number] Notes reviewed.  I've discussed patient's care with the patient and Dr. supple today.  The patient woke up yesterday and began having pain followed by the ultimate presentation to the emergency room and a hematoma evacuation last night.  It is unusual that after 5 days he presented with a hematoma. On Thursday Friday Saturday he did well with normal motion to his fingers and no problems.  At present time the distal portion of his wound is closed. He has a VAC on the upper arm.  He is sensate. He has no change in his vascular status at present time.  I discussed with the patient this unusual presentation.  I would recommend daily dressing change in the OR tomorrow with possible closure versus VAC continuation versus split-thickness skin graft.  At present juncture I would hold any and all blood thinners.  Is unusual to present this lady with a hematoma but not either question.  The patient denies trauma to the arm that he is aware of. He denies suicidal ideations.  He appears to be quite stable at this juncture.  We will continue aggressive care for this complicated upper extremity predicament with a return to the operative theater tomorrow.  Mariano Doshi M.D.

## 2015-08-16 NOTE — Op Note (Signed)
NAMMarland Kitchen:  Benjamin [name], Benjamin [name]              ACCOUNT NO.:  [id number]  MEDICAL RECORD NO.:  [id number]  LOCATION:  [facility]                         [facility]  PHYSICIAN:  Vania ReaKevin M. Daren Doswell, M.D.  DATE OF BIRTH:  04/29/1965  DATE OF PROCEDURE:  08/15/2015 DATE OF DISCHARGE:                              OPERATIVE REPORT   PREOP DIAGNOSIS:  Right forearm compartment syndrome.  POSTOP DIAGNOSIS:  Right forearm compartment syndrome.  PROCEDURE:  Evacuation of hematoma from the right forearm with exploration, irrigation and debridement of forearm compartments and tissues with subsequent application of a vacuum assisted closure device.  SURGEON:  Vania ReaKevin M. Marquerite Forsman, M.D.  ASSISTANNicki Guadalajara:  Tricia, PAC.  ANESTHESIA:  General endotracheal as well as an interscalene block.  ESTIMATED BLOOD LOSS:  Minimal.  DRAINS:  Vac x1.  HISTORY:  Mr. Benjamin [name] is a 50 year old male, status post a suicide attempt with multiple lacerations of the right forearm, have been treated by Dr. Amanda PeaGramig with an exploration of the right forearm, debridement, removal of devitalized tissues and treatment of a median nerve injury with placement of a nerve sheath device.  He has been discharged to the behavior health facility, presented back to the emergency room this evening with the acute onset of severe right forearm pain with associated significant swelling which was a dramatic change from a status yesterday.  Reports no significant change in his level of activity.  Reports incapacitating pain and relatively abrupt increase in swelling of the forearm and hand.  On evaluation in the emergency room he was noted to have marked swelling of the forearm from the proximal 3rd, middle 3rd junction distally into the wrist, hand and swollen digits as well.  He had excruciating pain with any attempts at passive extension of the digits.  He maintained 1+ radial pulse.  There was a reasonable capillary refill although I would not call it  brisk but certainly showed evidence that there was continued vascularity of the hand, certainly there was no cyanosis.  He was intact to light touch sensation in all digits.  Given the degree of swelling, the bloody drainage noted from the wound and the extreme pain with attempts at passive extension of the digits he is brought to the operating room emergently for exploration of this wound.  He had a previous compartment release performed by Dr. Amanda PeaGramig and our concern is that he developed a hematoma creating essentially another compartment syndrome of the forearm.  Preoperatively counseled Mr. Benjamin [name] regarding treatment options and potential risks versus benefits thereof.  Possible surgical complications were all reviewed including bleeding, infection, neurovascular injury and possible need for additional surgery.  He understands and accepts and agrees with our plan.  PROCEDURE IN DETAIL:  The patient was brought to the operating room, placed supine on the operating table.  Underwent smooth induction of a general endotracheal anesthesia.  He did receive prophylactic antibiotics.  A tourniquet applied to the right upper arm but was not inflated.  The right upper extremity was sterilely prepped and draped in standard fashion.  Time-out was called.  The previously placed sutures were removed over the proximal 3/4 of the incision and as these were released, the skin retracted  abruptly and a large amount of hematoma extruded through the wound.  We opened the proximal 3/4 of the incision leaving the portion over the carpal canal in place.  Through this, I was able to evacuate perhaps a cup of hematoma.  Once this was completed, we copiously irrigated the tissues.  As we milked the tissues and expressed from proximal and distal, the previously placed sheath around the median nerve distally extruded through the wound and so this was removed.  At this point, we then copiously irrigated all  the tissue planes and we did not find an actual arterial bleeder emanating off the ulnar artery which we carefully coagulated with bipolar electrocautery.  We then carefully evaluated all aspects of the wound and all tissue planes and did not identify any obvious continued bleeding points.  We copiously irrigated with several liters of warm saline.  At this point, once we were satisfied with the evacuation of the hematoma and controlling of the bleeding points, we then attempted to re-close the wound and we did accomplish this over approximately the distal 3/4 of the incision, closed with interrupted vertical mattress 3-0 nylon sutures.  The more proximal aspect however had a necrotic skin bridge that did not allow [place] to gain a side-to-side closure and so a small sponge for the vacuum assisted closure system was applied and then we wrapped the entire forearm and applied the wound vacuum assisted closure system.  In addition, this was hoped to prevent the reaccumulation of the hematoma. Once we were satisfied with the overall application of the vacuum assisted closure device, the arm was then placed into a [place] sling. The patient was awakened, extubated, and taken to the recovery room in stable condition.  Nicki Guadalajara, PA-C was used as an Geophysicist/field seismologist throughout this case, essential for help with positioning the patient, positioning the extremity, retraction of soft tissues, wound closure and intraoperative decision making.     Vania Rea. Kathy Wares, M.D.     KMS/MEDQ  D:  08/15/2015  T:  08/15/2015  Job:  [id number]

## 2015-08-16 NOTE — Progress Notes (Signed)
[facility] Amanda PeaGramig in to see pt. Plans to take pt to surgery tmrw to I and D right arm wound and replace wound vac. Pt alert and oriented. No distress noted.

## 2015-08-17 ENCOUNTER — Inpatient Hospital Stay (HOSPITAL_COMMUNITY): Payer: BLUE CROSS/BLUE SHIELD | Admitting: Certified Registered Nurse Anesthetist

## 2015-08-17 ENCOUNTER — Encounter (HOSPITAL_COMMUNITY)
Admission: AD | Disposition: A | Discharge status: Other Acute Inpatient Hospital | Payer: Self-pay | Attending: Orthopedic Surgery

## 2015-08-17 HISTORY — PX: SKIN SPLIT GRAFT: SHX444

## 2015-08-17 HISTORY — PX: APPLICATION OF WOUND VAC: SHX5189

## 2015-08-17 LAB — CBC WITH DIFFERENTIAL/PLATELET
BASOS PCT: 0 %
Basophils Absolute: 0 10*3/uL (ref 0.0–0.1)
EOS PCT: 2 %
Eosinophils Absolute: 0.1 10*3/uL (ref 0.0–0.7)
HEMATOCRIT: 28 % — AB (ref 39.0–52.0)
Hemoglobin: 8.7 g/dL — ABNORMAL LOW (ref 13.0–17.0)
Lymphocytes Relative: 38 %
Lymphs Abs: 2 10*3/uL (ref 0.7–4.0)
MCH: 24.4 pg — ABNORMAL LOW (ref 26.0–34.0)
MCHC: 31.1 g/dL (ref 30.0–36.0)
MCV: 78.4 fL (ref 78.0–100.0)
MONO ABS: 0.3 10*3/uL (ref 0.1–1.0)
MONOS PCT: 6 %
NEUTROS ABS: 2.8 10*3/uL (ref 1.7–7.7)
Neutrophils Relative %: 54 %
PLATELETS: 152 10*3/uL (ref 150–400)
RBC: 3.57 MIL/uL — ABNORMAL LOW (ref 4.22–5.81)
RDW: 15.1 % (ref 11.5–15.5)
WBC: 5.2 10*3/uL (ref 4.0–10.5)

## 2015-08-17 LAB — BASIC METABOLIC PANEL
Anion gap: 7 (ref 5–15)
BUN: 11 mg/dL (ref 6–20)
CALCIUM: 8.9 mg/dL (ref 8.9–10.3)
CO2: 28 mmol/L (ref 22–32)
CREATININE: 1.34 mg/dL — AB (ref 0.61–1.24)
Chloride: 106 mmol/L (ref 101–111)
GFR calc Af Amer: >60 mL/min (ref >60)
GLUCOSE: 105 mg/dL — AB (ref 65–99)
Potassium: 4 mmol/L (ref 3.5–5.1)
Sodium: 141 mmol/L (ref 135–145)

## 2015-08-17 SURGERY — APPLICATION, GRAFT, SKIN, SPLIT-THICKNESS
Anesthesia: General | Site: Hand | Laterality: Right

## 2015-08-17 MED ORDER — SENNOSIDES-DOCUSATE SODIUM 8.6-50 MG PO TABS
1.0000 | ORAL_TABLET | Freq: Two times a day (BID) | ORAL | Status: DC
  Administered 2015-08-18 – 2015-08-23 (×5): 1 via ORAL
  Filled 2015-08-17 (×11): qty 1

## 2015-08-17 MED ORDER — LIDOCAINE 2% (20 MG/ML) 5 ML SYRINGE
INTRAMUSCULAR | Status: AC
  Filled 2015-08-17: qty 10

## 2015-08-17 MED ORDER — FENTANYL CITRATE (PF) 250 MCG/5ML IJ SOLN
INTRAMUSCULAR | Status: AC
  Filled 2015-08-17: qty 5

## 2015-08-17 MED ORDER — GLYCOPYRROLATE 0.2 MG/ML IJ SOLN
INTRAMUSCULAR | Status: DC | PRN
  Administered 2015-08-17: 0.2 mg via INTRAVENOUS

## 2015-08-17 MED ORDER — MIDAZOLAM HCL 5 MG/5ML IJ SOLN
INTRAMUSCULAR | Status: DC | PRN
  Administered 2015-08-17: 2 mg via INTRAVENOUS

## 2015-08-17 MED ORDER — DEXAMETHASONE SODIUM PHOSPHATE 4 MG/ML IJ SOLN
INTRAMUSCULAR | Status: DC | PRN
  Administered 2015-08-17: 5 mg via INTRAVENOUS

## 2015-08-17 MED ORDER — SODIUM CHLORIDE 0.9 % IR SOLN
Status: DC | PRN
  Administered 2015-08-17: 1000 mL

## 2015-08-17 MED ORDER — LIDOCAINE HCL (CARDIAC) 20 MG/ML IV SOLN
INTRAVENOUS | Status: DC | PRN
  Administered 2015-08-17: 80 mg via INTRAVENOUS

## 2015-08-17 MED ORDER — MIDAZOLAM HCL 2 MG/2ML IJ SOLN
INTRAMUSCULAR | Status: AC
  Filled 2015-08-17: qty 2

## 2015-08-17 MED ORDER — LACTATED RINGERS IV SOLN
INTRAVENOUS | Status: DC | PRN
  Administered 2015-08-17: 18:00:00 via INTRAVENOUS

## 2015-08-17 MED ORDER — KETOROLAC TROMETHAMINE 30 MG/ML IJ SOLN: 30.0000 mg | Freq: Once | INTRAMUSCULAR | Status: DC | PRN

## 2015-08-17 MED ORDER — SODIUM CHLORIDE 0.9 % IR SOLN
Status: DC | PRN
  Administered 2015-08-17: 3000 mL

## 2015-08-17 MED ORDER — FENTANYL CITRATE (PF) 100 MCG/2ML IJ SOLN
INTRAMUSCULAR | Status: DC | PRN
  Administered 2015-08-17 (×2): 50 ug via INTRAVENOUS

## 2015-08-17 MED ORDER — PROPOFOL 10 MG/ML IV BOLUS
INTRAVENOUS | Status: AC
  Filled 2015-08-17: qty 20

## 2015-08-17 MED ORDER — HYDROMORPHONE HCL 1 MG/ML IJ SOLN: 0.2500 mg | INTRAMUSCULAR | Status: DC | PRN

## 2015-08-17 MED ORDER — SUGAMMADEX SODIUM 200 MG/2ML IV SOLN
INTRAVENOUS | Status: AC
  Filled 2015-08-17: qty 2

## 2015-08-17 MED ORDER — ONDANSETRON HCL 4 MG/2ML IJ SOLN
INTRAMUSCULAR | Status: DC | PRN
  Administered 2015-08-17: 4 mg via INTRAVENOUS

## 2015-08-17 MED ORDER — PROPOFOL 10 MG/ML IV BOLUS
INTRAVENOUS | Status: DC | PRN
  Administered 2015-08-17: 150 mg via INTRAVENOUS

## 2015-08-17 MED ORDER — VITAMIN C 500 MG PO TABS
1000.0000 mg | ORAL_TABLET | Freq: Every day | ORAL | Status: DC
  Administered 2015-08-17 – 2015-08-24 (×8): 1000 mg via ORAL
  Filled 2015-08-17 (×8): qty 2

## 2015-08-17 SURGICAL SUPPLY — 44 items
BANDAGE ELASTIC 6 VELCRO ST LF (GAUZE/BANDAGES/DRESSINGS) IMPLANT
BLADE SURG ROTATE 9660 (MISCELLANEOUS) IMPLANT
BNDG GAUZE ELAST 4 BULKY (GAUZE/BANDAGES/DRESSINGS) ×4 IMPLANT
CANISTER SUCTION 2500CC (MISCELLANEOUS) IMPLANT
COVER SURGICAL LIGHT HANDLE (MISCELLANEOUS) ×2 IMPLANT
DERMACARRIERS GRAFT 1 TO 1.5 (DISPOSABLE)
DRAIN CHANNEL 10F 3/8 F FF (DRAIN) ×2 IMPLANT
DRAIN CHANNEL 10M FLAT 3/4 FLT (DRAIN) ×2 IMPLANT
DRAIN HEMOVAC 1/8 X 5 (WOUND CARE) ×4 IMPLANT
DRAPE INCISE IOBAN 66X45 STRL (DRAPES) ×2 IMPLANT
DRSG ADAPTIC 3X8 NADH LF (GAUZE/BANDAGES/DRESSINGS) IMPLANT
DRSG MEPILEX BORDER 4X8 (GAUZE/BANDAGES/DRESSINGS) ×2 IMPLANT
DRSG MEPITEL 3X4 ME34 (GAUZE/BANDAGES/DRESSINGS) ×2 IMPLANT
DRSG MEPITEL 4X7.2 (GAUZE/BANDAGES/DRESSINGS) ×4 IMPLANT
DRSG PAD ABDOMINAL 8X10 ST (GAUZE/BANDAGES/DRESSINGS) IMPLANT
EVACUATOR SILICONE 100CC (DRAIN) ×4 IMPLANT
GAUZE SPONGE 4X4 12PLY STRL (GAUZE/BANDAGES/DRESSINGS) IMPLANT
GAUZE XEROFORM 5X9 LF (GAUZE/BANDAGES/DRESSINGS) ×6 IMPLANT
GLOVE BIOGEL M 8.0 STRL (GLOVE) ×2 IMPLANT
GLOVE SS BIOGEL STRL SZ 8 (GLOVE) ×1 IMPLANT
GLOVE SUPERSENSE BIOGEL SZ 8 (GLOVE) ×1
GOWN STRL REUS W/ TWL LRG LVL3 (GOWN DISPOSABLE) ×1 IMPLANT
GOWN STRL REUS W/ TWL XL LVL3 (GOWN DISPOSABLE) ×3 IMPLANT
GOWN STRL REUS W/TWL LRG LVL3 (GOWN DISPOSABLE) ×1
GOWN STRL REUS W/TWL XL LVL3 (GOWN DISPOSABLE) ×3
GRAFT DERMACARRIERS 1 TO 1.5 (DISPOSABLE) IMPLANT
KIT BASIN OR (CUSTOM PROCEDURE TRAY) ×2 IMPLANT
KIT ROOM TURNOVER OR (KITS) ×2 IMPLANT
MANIFOLD NEPTUNE II (INSTRUMENTS) IMPLANT
NS IRRIG 1000ML POUR BTL (IV SOLUTION) ×2 IMPLANT
PACK ORTHO EXTREMITY (CUSTOM PROCEDURE TRAY) ×2 IMPLANT
PAD ARMBOARD 7.5X6 YLW CONV (MISCELLANEOUS) ×4 IMPLANT
PAD CAST 4YDX4 CTTN HI CHSV (CAST SUPPLIES) ×1 IMPLANT
PADDING CAST COTTON 4X4 STRL (CAST SUPPLIES) ×1
SPLINT FIBERGLASS 4X30 (CAST SUPPLIES) ×2 IMPLANT
SPONGE GAUZE 4X4 12PLY STER LF (GAUZE/BANDAGES/DRESSINGS) ×2 IMPLANT
STAPLER VISISTAT 35W (STAPLE) IMPLANT
SUT PROLENE 3 0 PS 2 (SUTURE) ×10 IMPLANT
TOWEL OR 17X24 6PK STRL BLUE (TOWEL DISPOSABLE) ×2 IMPLANT
TOWEL OR 17X26 10 PK STRL BLUE (TOWEL DISPOSABLE) ×2 IMPLANT
TUBE CONNECTING 12X1/4 (SUCTIONS) IMPLANT
UNDERPAD 30X30 INCONTINENT (UNDERPADS AND DIAPERS) ×2 IMPLANT
WATER STERILE IRR 1000ML POUR (IV SOLUTION) ×2 IMPLANT
YANKAUER SUCT BULB TIP NO VENT (SUCTIONS) IMPLANT

## 2015-08-17 NOTE — Progress Notes (Signed)
Dr Amanda PeaGramig called and [facility].

## 2015-08-17 NOTE — Op Note (Signed)
See [id number]#[id number] Amanda PeaGramig MD

## 2015-08-17 NOTE — Anesthesia Postprocedure Evaluation (Signed)
Anesthesia Post Note  Patient: Benjamin HaagensenJohn Blatt  Procedure(s) Performed: Procedure(s) (LRB): I&D RIGHT FOREARM/POSSIBLE SKIN GRAFT (Right) APPLICATION OF WOUND VAC (Right)  Patient location during evaluation: PACU Anesthesia Type: General Level of consciousness: awake and alert Pain management: pain level controlled Vital Signs Assessment: post-procedure vital signs reviewed and stable Respiratory status: spontaneous breathing, nonlabored ventilation, respiratory function stable and patient connected to nasal cannula oxygen Cardiovascular status: blood pressure returned to baseline and stable Postop Assessment: no signs of nausea or vomiting Anesthetic complications: no    Last Vitals:  Filed Vitals:   08/17/15 2015 08/17/15 2046  BP: 139/95 151/86  Pulse: 75 66  Temp:  36.4 C  Resp: 8 14    Last Pain:  Filed Vitals:   08/17/15 2047  PainSc: 3                  Shelton SilvasKevin D Aleisa Howk

## 2015-08-17 NOTE — Progress Notes (Signed)
Report given to RN.  Sitter at bedside.  VSS.

## 2015-08-17 NOTE — Anesthesia Procedure Notes (Signed)
Procedure Name: LMA Insertion Date/Time: 08/17/2015 6:24 PM Performed by: Reine JustFLOWERS, Aleiya Rye T Pre-anesthesia Checklist: Patient identified, Emergency Drugs available, Suction available, Patient being monitored and Timeout performed Patient Re-evaluated:Patient Re-evaluated prior to inductionOxygen Delivery Method: Circle system utilized and Simple face mask Preoxygenation: Pre-oxygenation with 100% oxygen Intubation Type: IV induction Ventilation: Mask ventilation without difficulty LMA: LMA inserted LMA Size: 5.0 Airway Equipment and Method: Patient positioned with wedge pillow Placement Confirmation: positive ETCO2 and breath sounds checked- equal and bilateral Tube secured with: Tape Dental Injury: Teeth and Oropharynx as per pre-operative assessment

## 2015-08-17 NOTE — Transfer of Care (Signed)
Immediate Anesthesia Transfer of Care Note  Patient: Benjamin HaagensenJohn Bugaj  Procedure(s) Performed: Procedure(s): I&D RIGHT FOREARM/POSSIBLE SKIN GRAFT (Right) APPLICATION OF WOUND VAC (Right)  Patient Location: PACU  Anesthesia Type:General  Level of Consciousness: awake and patient cooperative  Airway & Oxygen Therapy: Patient Spontanous Breathing and Patient connected to face mask oxygen  Post-op Assessment: Report given to RN and Post -op Vital signs reviewed and stable  Post vital signs: Reviewed and stable  Last Vitals:  Filed Vitals:   08/17/15 0500 08/17/15 1658  BP: 125/69 139/69  Pulse: 67 55  Temp: 36.5 C 36.6 C  Resp: 18 18    Last Pain:  Filed Vitals:   08/17/15 1658  PainSc: 3       Patients Stated Pain Goal: 1 (08/16/15 1735)  Complications: No apparent anesthesia complications

## 2015-08-17 NOTE — Op Note (Signed)
NAMMarland Kitchen:  Benjamin [name], Benjamin              ACCOUNT NO.:  [id number]  MEDICAL RECORD NO.:  [id number]  LOCATION:  [id number]                        FACILITY:  [facility]  PHYSICIAN:  Dionne AnoWilliam M. Charlottie Peragine, M.D.DATE OF BIRTH:  1965-09-27  DATE OF PROCEDURE: DATE OF DISCHARGE:                              OPERATIVE REPORT   PREOPERATIVE DIAGNOSIS:  Status post self-inflicted laceration to the right forearm one week ago with subsequent development of a hematoma 5 days later, which required decompression Sunday night.  The patient now presents for repeat incision and drainage and possible closure.  POSTOPERATIVE DIAGNOSIS:  Status post self-inflicted laceration to the right forearm one week ago with subsequent development of a hematoma 5 days later, which required decompression Sunday night.  The patient now presents for repeat incision and drainage and possible closure.  PROCEDURES: 1. Irrigation and debridement of skin, subcutaneous tissue, muscle,     and tendon. 2. Ulnar artery and nerve exploration with neurolysis of the ulnar     nerve and ulnar artery evaluation/exploration. 3. Flexor tenolysis, tenosynovectomy, right forearm. 4. Complex closure over two JP drains, right forearm.  SURGEON:  Dionne AnoWilliam M. Amanda PeaGramig, M.D.  ASSISTANT:  None.  COMPLICATIONS:  None.  ANESTHESIA:  General.  TOURNIQUET TIME:  Zero.  INDICATIONS:  The patient is a 50 year old male who has multiple suicide attempts.  He presented as noted 1 month ago to my partner Dr. Melvyn Novasrtmann for left-sided injuries.  He presented 1 week ago for my evaluation after a self-inflicted wound to the right forearm, which was highly complex as described in his chart.  He actually presented 2 days after his initial injury and had significant disarray of the arm.  We took him to Surgery, performed irrigation and debridement and multiple repairs and the soft tissue managements.  He was closed and discharged ultimately after hospital stay.  He did  very well until Sunday morning as the chart describes.  Sunday morning, he began having pain, which accumulated in hematoma.  He appears to have developed a spontaneous hematoma in his arm.  He was taken to Surgery Sunday night by Dr. Rennis ChrisSupple, hematoma evacuated and I have discussed the case with my partner, Dr. Rennis ChrisSupple at length.  He now presents for repeat I and D.  I have discussed with the patient all issues, do's and don'ts, etc.  OPERATION IN DETAIL:  The patient was seen by myself and Anesthesia. The patient was taken to the operative theater and underwent a general anesthetic.  Following this, the patient then had the arm prescrubbed with two separate Hibiclens scrubs followed by Betadine scrub and paint for 10 minutes and sterile field being secured.  I then performed removal of prior sutures and we performed at this juncture irrigation and debridement of skin, subcutaneous tissue, muscle, and tendon.  This was an excisional debridement greater than 20 square centimeters utilizing scissor, knife and associated orthopedic instruments. Following this, I performed a limited flexor tenolysis, tenosynovectomy to remove any inflammatory fluid.  Following this, I performed a median nerve neurolysis and exploration, it was noted to be intact and stable.  Following this, we performed an ulnar nerve neurolysis and exploration, it was noted to be intact and  stable.  Following this, I performed ulnar artery exploration, it was intact, pulsatile and looked excellent.  He had some general ooze about the skin edge, but no obvious bleeders or active bleeding present.  At this time, we placed 3 liters of fluid through cysto tubing around the areas in question.  Once this was complete, I placed two JP drains and once this was complete, I then placed multiple sutures in the forearm.  Combination of far-near-near-far and other horizontal vertical mattress sutures were placed without  difficulty.  The patient tolerated this well.  The patient then had Xeroform followed by gauze and a modified long-arm dressing placed with posterior splint.  The patient tolerated this nicely and there were no issues.  Following placement of the splint, the patient then was taken to recovery room.  I did hook the JP drains up to suction.  Suction was a bit poor, but there was no obvious active bleeding.  He is going to be continued as an inpatient.  We will continue IV antibiotics, general postop observatory care and other measures.  Should problems arise, he will notify me.  I have discussed all care with his family, they were concerned.  He does not have suicidal ideations at present time, and his left arm looks good.  We are going to do everything we can to try and help this gentleman.  I will continue watching his blood counts; however, the blood count at present time was quite well.  Do's and don'ts have been discussed.  There were no complicating features.     Dionne Ano. Amanda Pea, M.D.     [facility]  D:  08/17/2015  T:  08/17/2015  Job:  [id number]

## 2015-08-17 NOTE — Progress Notes (Signed)
Pt states that his ace wrap dressing is "moist" and would like it removed. Educated Pt that only the doctor can do that. Pt refused to have his dressing reinforced.

## 2015-08-17 NOTE — Progress Notes (Signed)
As a Engineer, drillingWOT RN, I helped give primary RNs meds.

## 2015-08-17 NOTE — Anesthesia Preprocedure Evaluation (Addendum)
Anesthesia Evaluation  Patient identified by MRN, date of birth, ID band Patient awake    Reviewed: Allergy & Precautions, NPO status , Patient's Chart, lab work & pertinent test results  History of Anesthesia Complications Negative for: history of anesthetic complications  Airway Mallampati: II  TM Distance: >3 FB Neck ROM: Full    Dental no notable dental hx. (+) Teeth Intact, Dental Advisory Given   Pulmonary Current Smoker,    Pulmonary exam normal breath sounds clear to auscultation       Cardiovascular hypertension, Normal cardiovascular exam Rhythm:Regular Rate:Normal     Neuro/Psych PSYCHIATRIC DISORDERS Depression negative neurological ROS     GI/Hepatic negative GI ROS, (+)     substance abuse  cocaine use,   Endo/Other  negative endocrine ROS  Renal/GU negative Renal ROS  negative genitourinary   Musculoskeletal negative musculoskeletal ROS (+)   Abdominal   Peds negative pediatric ROS (+)  Hematology  (+) Blood dyscrasia, anemia ,   Anesthesia Other Findings   Reproductive/Obstetrics negative OB ROS                            Anesthesia Physical Anesthesia Plan  ASA: II  Anesthesia Plan: General   Post-op Pain Management:    Induction: Intravenous  Airway Management Planned: LMA  Additional Equipment:   Intra-op Plan:   Post-operative Plan: Extubation in OR  Informed Consent: I have reviewed the patients History and Physical, chart, labs and discussed the procedure including the risks, benefits and alternatives for the proposed anesthesia with the patient or authorized representative who has indicated his/her understanding and acceptance.   Dental advisory given  Plan Discussed with: CRNA and Surgeon  Anesthesia Plan Comments:         Anesthesia Quick Evaluation                                   Anesthesia Evaluation  Patient identified by MRN,  date of birth, ID band Patient awake    Reviewed: Allergy & Precautions, NPO status , Patient's Chart, lab work & pertinent test results  History of Anesthesia Complications Negative for: history of anesthetic complications  Airway Mallampati: II  TM Distance: >3 FB Neck ROM: Full    Dental no notable dental hx.    Pulmonary neg pulmonary ROS, Current Smoker,    Pulmonary exam normal breath sounds clear to auscultation       Cardiovascular Exercise Tolerance: Good negative cardio ROS Normal cardiovascular exam Rhythm:Regular Rate:Normal     Neuro/Psych PSYCHIATRIC DISORDERS Depression Multiple suicide attempts negative neurological ROS  negative psych ROS   GI/Hepatic negative GI ROS, Neg liver ROS, (+)     substance abuse  alcohol use, cocaine use and marijuana use,   Endo/Other  negative endocrine ROS  Renal/GU Renal diseasenegative Renal ROS  negative genitourinary   Musculoskeletal negative musculoskeletal ROS (+)   Abdominal   Peds negative pediatric ROS (+)  Hematology negative hematology ROS (+)   Anesthesia Other Findings   Reproductive/Obstetrics negative OB ROS                           Lab Results  Component Value Date   WBC 5.2 08/17/2015   HGB 8.7* 08/17/2015   HCT 28.0* 08/17/2015   MCV 78.4 08/17/2015   PLT 152 08/17/2015  Chemistry      Component Value Date/Time   NA 141 08/17/2015 0353   K 4.0 08/17/2015 0353   CL 106 08/17/2015 0353   CO2 28 08/17/2015 0353   BUN 11 08/17/2015 0353   CREATININE 1.34* 08/17/2015 0353   CREATININE 1.40* 05/24/2015 0001      Component Value Date/Time   CALCIUM 8.9 08/17/2015 0353   ALKPHOS 19* 08/12/2015 0553   AST 21 08/12/2015 0553   ALT 20 08/12/2015 0553   BILITOT 1.4* 08/12/2015 0553     BP Readings from Last 3 Encounters:  08/17/15 139/69  08/16/15 199/101  08/13/15 127/58    Anesthesia Physical Anesthesia Plan  ASA: II  Anesthesia  Plan: General   Post-op Pain Management:    Induction: Intravenous  Airway Management Planned: Oral ETT and LMA  Additional Equipment:   Intra-op Plan:   Post-operative Plan: Extubation in OR  Informed Consent: I have reviewed the patients History and Physical, chart, labs and discussed the procedure including the risks, benefits and alternatives for the proposed anesthesia with the patient or authorized representative who has indicated his/her understanding and acceptance.   Dental advisory given  Plan Discussed with: CRNA and Surgeon  Anesthesia Plan Comments:         Anesthesia Quick Evaluation

## 2015-08-17 NOTE — H&P (Signed)
Patient presents for repeat irrigation and debridement. We'll perform possible closure of his arm if conditions are stable. I discussed all issues  [place] He remains neurovascularly intact. He is still sore as expected. Will proceed accordingly. He denies suicidal ideations. The patient is alert and oriented in no acute distress. The patient complains of pain in the affected upper extremity.  The patient is noted to have a normal HEENT exam. Lung fields show equal chest expansion and no shortness of breath. Abdomen exam is nontender without distention. Lower extremity examination does not show any fracture dislocation or blood clot symptoms. Pelvis is stable and the neck and back are stable and nontender. We are planning surgery for your upper extremity. The risk and benefits of surgery to include risk of bleeding, infection, anesthesia,  damage to normal structures and failure of the surgery to accomplish its intended goals of relieving symptoms and restoring function have been discussed in detail. With this in mind we plan to proceed. I have specifically discussed with the patient the pre-and postoperative regime and the dos and don'ts and risk and benefits in great detail. Risk and benefits of surgery also include risk of dystrophy(CRPS), chronic nerve pain, failure of the healing process to go onto completion and other inherent risks of surgery The relavent the pathophysiology of the disease/injury process, as well as the alternatives for treatment and postoperative course of action has been discussed in great detail with the patient who desires to proceed.  We will do everything in our power to help you (the patient) restore function to the upper extremity. It is a pleasure to see this patient today. Vaughn Beaumier MD

## 2015-08-17 NOTE — Progress Notes (Signed)
   08/17/15 1235  Clinical Encounter Type  Visited With Patient  Visit Type Initial;Spiritual support;Social support  Referral From Nurse  Spiritual Encounters  Spiritual Needs Prayer;Emotional;Grief support  Stress Factors  Patient Stress Factors Loss;Family relationships;Health changes   Chaplain responded to a consult. Patient is experiencing grief (within the past six months has lost his mother, grandmother, and cousin). Patient is still feeling depressed, but expressed wanting to get better and to "be healthy physically and mentally." Chaplain offered prayer and support. Spiritual care services available as needed.   Alda Ponderdam M Jes Costales, Chaplain 08/17/2015 12:56 PM

## 2015-08-18 ENCOUNTER — Encounter (HOSPITAL_COMMUNITY): Payer: Self-pay | Admitting: Orthopedic Surgery

## 2015-08-18 LAB — CBC WITH DIFFERENTIAL/PLATELET
Basophils Absolute: 0 10*3/uL (ref 0.0–0.1)
Basophils Relative: 0 %
EOS ABS: 0 10*3/uL (ref 0.0–0.7)
EOS PCT: 0 %
HCT: 28.9 % — ABNORMAL LOW (ref 39.0–52.0)
Hemoglobin: 9 g/dL — ABNORMAL LOW (ref 13.0–17.0)
LYMPHS ABS: 0.5 10*3/uL — AB (ref 0.7–4.0)
LYMPHS PCT: 9 %
MCH: 24.7 pg — AB (ref 26.0–34.0)
MCHC: 31.1 g/dL (ref 30.0–36.0)
MCV: 79.2 fL (ref 78.0–100.0)
MONO ABS: 0.2 10*3/uL (ref 0.1–1.0)
MONOS PCT: 3 %
Neutro Abs: 5.1 10*3/uL (ref 1.7–7.7)
Neutrophils Relative %: 88 %
PLATELETS: 193 10*3/uL (ref 150–400)
RBC: 3.65 MIL/uL — AB (ref 4.22–5.81)
RDW: 15.1 % (ref 11.5–15.5)
WBC: 5.8 10*3/uL (ref 4.0–10.5)

## 2015-08-18 LAB — BASIC METABOLIC PANEL
Anion gap: 6 (ref 5–15)
BUN: 11 mg/dL (ref 6–20)
CHLORIDE: 101 mmol/L (ref 101–111)
CO2: 31 mmol/L (ref 22–32)
CREATININE: 1.46 mg/dL — AB (ref 0.61–1.24)
Calcium: 9.4 mg/dL (ref 8.9–10.3)
GFR calc non Af Amer: 56 mL/min — ABNORMAL LOW (ref >60)
GLUCOSE: 139 mg/dL — AB (ref 65–99)
Potassium: 4.9 mmol/L (ref 3.5–5.1)
Sodium: 138 mmol/L (ref 135–145)

## 2015-08-18 LAB — WOUND CULTURE
Culture: NO GROWTH
Gram Stain: NONE SEEN

## 2015-08-18 LAB — PROTIME-INR
INR: 1.1 (ref 0.00–1.49)
Prothrombin Time: 14.4 seconds (ref 11.6–15.2)

## 2015-08-18 LAB — APTT: aPTT: 25 seconds (ref 24–37)

## 2015-08-18 NOTE — Progress Notes (Signed)
Patient ID: Benjamin HaagensenJohn [name], male   DOB: 02-Mar-1966, 50 y.o.   MRN: [id number] Patient is alert and oriented. Patient denies complaints.  He has minimal drainage. I have his drains hooked up to low intermittent suction.  He is examined at length.  Physical exam: His exam shows normal vascular exam, normal sensation and good flexion and extension of the fingers. Overall he looks very good today. The patient is alert and oriented in no acute distress. The patient complains of pain in the affected upper extremity.  The patient is noted to have a normal HEENT exam. Lung fields show equal chest expansion and no shortness of breath. Abdomen exam is nontender without distention. Lower extremity examination does not show any fracture dislocation or blood clot symptoms. Pelvis is stable and the neck and back are stable and nontender. I'm going to have continued suction drainage. He can get out of bed and walked the hallways. During this time we'll clamp the suction. I have discussed all issues with nursing staff  Should've problems arise he'll notify us.  I discussed with patient my plan will be to likely remove his drain tomorrow. I will await his a.m. labs and reviewed these later this morning.  I would like to keep him in the hospital for an extended period of time given he is spontaneous hematoma formation 5 days out from his index procedure and the fact that he is at a Behavioral Health institution through our hospital system where there is not surgical care management readily available  Patient agrees with this plan.  Patient is in good spirits.  Patient is not suicidal at this time.  Overall think he is doing quite nicely.  I discussed all issues with his family as well.  Shalev Helminiak M.D.

## 2015-08-19 NOTE — Progress Notes (Signed)
Subjective: 2 Days Post-Op Procedure(s) (LRB): I&D RIGHT FOREARM/POSSIBLE SKIN GRAFT (Right) APPLICATION OF WOUND VAC (Right) Patient reports pain as mild.   Tolerating regular diet. He is in good spirits. He has no complaints today.  Objective: Vital signs in last 24 hours: Temp:  [98.2 F (36.8 C)-98.5 F (36.9 C)] 98.2 F (36.8 C) (05/25 1501) Pulse Rate:  [63-72] 70 (05/25 1501) Resp:  [14-17] 17 (05/25 1501) BP: (121-154)/(77-89) 154/89 mmHg (05/25 1501) SpO2:  [98 %-100 %] 98 % (05/25 1501)  Intake/Output from previous day: 05/24 0701 - 05/25 0700 In: 820 [P.O.:720; IV Piggyback:100] Out: 800 [Urine:800] Intake/Output this shift: Total I/O In: 1200 [P.O.:1200] Out: -    Recent Labs  08/17/15 0353 08/18/15 0450  HGB 8.7* 9.0*    Recent Labs  08/17/15 0353 08/18/15 0450  WBC 5.2 5.8  RBC 3.57* 3.65*  HCT 28.0* 28.9*  PLT 152 193    Recent Labs  08/17/15 0353 08/18/15 0450  NA 141 138  K 4.0 4.9  CL 106 101  CO2 28 31  BUN 11 11  CREATININE 1.34* 1.46*  GLUCOSE 105* 139*  CALCIUM 8.9 9.4    Recent Labs  08/18/15 0450  INR 1.10   Physical exam: Dressing is removed his 2 JP drains are now removed at 48 hours postop. The wound is soft stable compartments are soft there is no drainage and Olux quite well. We redressed the arm and we'll continue observatory care elevation and range of motion. His fingers have decent motion but a little severe extension due to the muscle being stretched. We have discussed with him pushing through this pain. Neurologically intact ABD soft Neurovascular intact Sensation intact distally Intact pulses distally No cellulitis present Compartment soft  Assessment/Plan: 2 Days Post-Op Procedure(s) (LRB): I&D RIGHT FOREARM/POSSIBLE SKIN GRAFT (Right) APPLICATION OF WOUND VAC (Right) We will plan for continued elevation and careful ulceration.  I would recommend a 48-hour observatory period followed by transfer back  to behavioral health if the wound looks good.  He looks very well today on his exam. Once again it's unusual for someone to present with a hematoma 5 days out in such a dramatic fashion thus I would like to keep him in the hospital for observation.  His blood count has remained excellent his vital stable and he is in excellent spirits without suicidal ideation ref all questions have been encouraged and answered.  Benjamin [name],Benjamin [name] 08/19/2015, 5:30 PM

## 2015-08-20 LAB — ANAEROBIC CULTURE

## 2015-08-20 MED ORDER — CEFAZOLIN SODIUM 1-5 GM-% IV SOLN: 1.0000 g | Freq: Three times a day (TID) | INTRAVENOUS | Status: DC

## 2015-08-20 NOTE — Progress Notes (Signed)
Patient ID: Benjamin HaagensenJohn [name], male   DOB: [birth year], 50 y.o.   MRN: [id number] Patient is alert and oriented.  Patient has no complicating features today.  Procedure dressing was changed he tolerated this well he still has some serosanguineous discharge but no evidence of infection. His compartments are soft. He has normal neurovascular exam for the most part (his neurovascular examination is not changed from his baseline/he does have some median nerve injury process that is old in nature.).  I would recommend continued range of motion to the fingers. I would recommend continued dressing changes. We'll keep him in the hospital of the weekend as he is going to be going back to behavioral health after he is stable. Given the limited resources at behavioral health) to be completely stable and not draining prior to leaving.  I discussed him walking outside and other measures.  He is not suicidal.  The patient is alert and oriented in no acute distress. The patient complains of pain in the affected upper extremity.  The patient is noted to have a normal HEENT exam. Lung fields show equal chest expansion and no shortness of breath. Abdomen exam is nontender without distention. Lower extremity examination does not show any fracture dislocation or blood clot symptoms. Pelvis is stable and the neck and back are stable and nontender.  Overall think he looks well and him I had a long conversation about all issues. We'll continue IV Ancef and close observatory care.  Mickayla Trouten M.D.

## 2015-08-21 NOTE — Progress Notes (Signed)
Patient ID: Mady HaagensenJohn Mcclintic, male   DOB: May 01, 1965, 50 y.o.   MRN: [id number] Patient is alert and oriented  Vital signs stable.  No temperatures.  Overall he's looking quite well today. He is ambulatory without signs of DVT or infection.  I've changed his dressing at bedside dressing change was performed without difficulty his dressing is clean and has some serous discharge without infectious quality.  At present juncture he is quite stable. I would recommend continued dressing changes to the weekend and will transfer back to behavioral health early next week.  The patient is alert and oriented in no acute distress. The patient complains of pain in the affected upper extremity.  The patient is noted to have a normal HEENT exam. Lung fields show equal chest expansion and no shortness of breath. Abdomen exam is nontender without distention. Lower extremity examination does not show any fracture dislocation or blood clot symptoms. Pelvis is stable and the neck and back are stable and nontender.  All questions have been encouraged and answered  Nakiya Rallis M.D.

## 2015-08-22 MED ORDER — SODIUM CHLORIDE 0.9% FLUSH: 3.0000 mL | INTRAVENOUS | Status: DC | PRN

## 2015-08-22 MED ORDER — SODIUM CHLORIDE 0.9% FLUSH
3.0000 mL | Freq: Two times a day (BID) | INTRAVENOUS | Status: DC
Start: 2015-08-22 — End: 2015-08-24
  Administered 2015-08-22 – 2015-08-24 (×4): 3 mL via INTRAVENOUS

## 2015-08-22 NOTE — Progress Notes (Signed)
Subjective: 5 Days Post-Op Procedure(s) (LRB): I&D RIGHT FOREARM/POSSIBLE SKIN GRAFT (Right) APPLICATION OF WOUND VAC (Right) Patient reports pain as 3 on 0-10 scale.    Objective: Vital signs in last 24 hours: Temp:  [98.1 F (36.7 C)-98.4 F (36.9 C)] 98.1 F (36.7 C) (05/28 0552) Pulse Rate:  [65-71] 65 (05/28 0552) Resp:  [16] 16 (05/28 0552) BP: (146-152)/(82-85) 151/82 mmHg (05/28 0552) SpO2:  [95 %-98 %] 96 % (05/28 0552)  Intake/Output from previous day: 05/27 0701 - 05/28 0700 In: 1280 [P.O.:1080; IV Piggyback:200] Out: -  Intake/Output this shift:    No results for input(s): HGB in the last 72 hours. No results for input(s): WBC, RBC, HCT, PLT in the last 72 hours. No results for input(s): NA, K, CL, CO2, BUN, CREATININE, GLUCOSE, CALCIUM in the last 72 hours. No results for input(s): LABPT, INR in the last 72 hours.  Incision: wound healing. No necrosis.  Assessment/Plan: 5 Days Post-Op Procedure(s) (LRB): I&D RIGHT FOREARM/POSSIBLE SKIN GRAFT (Right) APPLICATION OF WOUND VAC (Right) Advance diet  Continue dressing changes. D/C Tuesday.  Lonell Stamos C 08/22/2015, 7:59 AM

## 2015-08-22 NOTE — Progress Notes (Signed)
Patient ID: Mady HaagensenJohn [name], male   DOB: Feb 18, 1966, 50 y.o.   MRN: [id number] Patient is alert and oriented.  I'll signs stable.  Patient is working diligently on his range of motion actively. He is had a prior injury at age 50 which is left him with some deficit in the hand. This deficit is a static issue. Our goal is to rehabilitate the arm and get him as much function as possible to return.  Fortunately all motor groups are working nicely. He has had no reaccumulation of hematoma or other problems.  Thank you be safe to return to behavioral health Tuesday after [place] [employer] [place]. I would prefer that he have daily dressing changes and observatory care by myself over the weekend given the wound care that is necessary.  Patient understands this.  Patient is in good spirits.  The patient is alert and oriented in no acute distress. The patient complains of pain in the affected upper extremity.  The patient is noted to have a normal HEENT exam. Lung fields show equal chest expansion and no shortness of breath. Abdomen exam is nontender without distention. Lower extremity examination does not show any fracture dislocation or blood clot symptoms. Pelvis is stable and the neck and back are stable and nontender.  Patient denies suicidal ideations.  All questions have been encouraged and answered  [name] M.D.

## 2015-08-23 NOTE — Progress Notes (Signed)
Patient ID: Benjamin HaagensenJohn [name], male   DOB: [birth year], 50 y.o.   MRN: [id number] Patient is doing quite well.  VSS, afebrile.  Patient is neurovascularly intact and working on range of motion passively and actively quite well.  The patient is alert and oriented in no acute distress. The patient complains of pain in the affected upper extremity.  The patient is noted to have a normal HEENT exam. Lung fields show equal chest expansion and no shortness of breath. Abdomen exam is nontender without distention. Lower extremity examination does not show any fracture dislocation or blood clot symptoms. Pelvis is stable and the neck and back are stable and nontender.  I changed his bandage and all looks very good. He has soft compartments no complicating features and could not look any better at this juncture.  I discussed with Benjamin RuizJohn all issues. We will plan to return him to behavioral health tomorrow and I will plan to arrange follow-up to remove his sutures in 1 week. I would recommend daily dressing changes for him.   all questions have been encouraged and answered  Patient is agreeable to the plan of care  Benjamin Sprankle MD

## 2015-08-24 ENCOUNTER — Inpatient Hospital Stay (HOSPITAL_COMMUNITY)
Admission: AD | Admit: 2015-08-24 | Discharge: 2015-09-07 | DRG: 906 | Disposition: A | Discharge status: Other Acute Inpatient Hospital | Payer: BLUE CROSS/BLUE SHIELD | Source: Other Acute Inpatient Hospital | Attending: Orthopedic Surgery | Admitting: Orthopedic Surgery

## 2015-08-24 ENCOUNTER — Encounter (HOSPITAL_COMMUNITY): Payer: Self-pay | Admitting: *Deleted

## 2015-08-24 DIAGNOSIS — S51811A Laceration without foreign body of right forearm, initial encounter: Secondary | ICD-10-CM | POA: Diagnosis present

## 2015-08-24 DIAGNOSIS — F17210 Nicotine dependence, cigarettes, uncomplicated: Secondary | ICD-10-CM | POA: Diagnosis present

## 2015-08-24 DIAGNOSIS — R634 Abnormal weight loss: Secondary | ICD-10-CM

## 2015-08-24 DIAGNOSIS — F1290 Cannabis use, unspecified, uncomplicated: Secondary | ICD-10-CM | POA: Diagnosis present

## 2015-08-24 DIAGNOSIS — F1490 Cocaine use, unspecified, uncomplicated: Secondary | ICD-10-CM | POA: Diagnosis present

## 2015-08-24 DIAGNOSIS — Z7289 Other problems related to lifestyle: Secondary | ICD-10-CM

## 2015-08-24 DIAGNOSIS — S5011XA Contusion of right forearm, initial encounter: Secondary | ICD-10-CM

## 2015-08-24 DIAGNOSIS — T79A11A Traumatic compartment syndrome of right upper extremity, initial encounter: Principal | ICD-10-CM | POA: Diagnosis present

## 2015-08-24 DIAGNOSIS — M549 Dorsalgia, unspecified: Secondary | ICD-10-CM | POA: Diagnosis present

## 2015-08-24 DIAGNOSIS — G8929 Other chronic pain: Secondary | ICD-10-CM | POA: Diagnosis present

## 2015-08-24 DIAGNOSIS — I10 Essential (primary) hypertension: Secondary | ICD-10-CM | POA: Diagnosis present

## 2015-08-24 DIAGNOSIS — F322 Major depressive disorder, single episode, severe without psychotic features: Secondary | ICD-10-CM | POA: Diagnosis present

## 2015-08-24 DIAGNOSIS — R2231 Localized swelling, mass and lump, right upper limb: Secondary | ICD-10-CM | POA: Diagnosis present

## 2015-08-24 DIAGNOSIS — G4700 Insomnia, unspecified: Secondary | ICD-10-CM

## 2015-08-24 DIAGNOSIS — F332 Major depressive disorder, recurrent severe without psychotic features: Secondary | ICD-10-CM | POA: Diagnosis not present

## 2015-08-24 DIAGNOSIS — Z6822 Body mass index (BMI) 22.0-22.9, adult: Secondary | ICD-10-CM | POA: Diagnosis not present

## 2015-08-24 DIAGNOSIS — X789XXA Intentional self-harm by unspecified sharp object, initial encounter: Secondary | ICD-10-CM

## 2015-08-24 DIAGNOSIS — F419 Anxiety disorder, unspecified: Secondary | ICD-10-CM

## 2015-08-24 DIAGNOSIS — R45851 Suicidal ideations: Secondary | ICD-10-CM | POA: Diagnosis not present

## 2015-08-24 DIAGNOSIS — S41111A Laceration without foreign body of right upper arm, initial encounter: Secondary | ICD-10-CM | POA: Diagnosis present

## 2015-08-24 MED ORDER — HYDROXYZINE HCL 25 MG PO TABS
25.0000 mg | ORAL_TABLET | Freq: Four times a day (QID) | ORAL | Status: DC | PRN
  Administered 2015-08-24 – 2015-08-27 (×4): 25 mg via ORAL
  Filled 2015-08-24 (×4): qty 1

## 2015-08-24 MED ORDER — OXYCODONE HCL 5 MG PO TABS
10.0000 mg | ORAL_TABLET | Freq: Four times a day (QID) | ORAL | Status: DC | PRN
  Administered 2015-08-24 – 2015-08-31 (×20): 10 mg via ORAL
  Filled 2015-08-24 (×20): qty 2

## 2015-08-24 MED ORDER — CEPHALEXIN 500 MG PO CAPS: 500.0000 mg | ORAL_CAPSULE | Freq: Four times a day (QID) | ORAL | Status: DC

## 2015-08-24 MED ORDER — CEPHALEXIN 250 MG PO CAPS
500.0000 mg | ORAL_CAPSULE | Freq: Four times a day (QID) | ORAL | Status: AC
  Administered 2015-08-24 – 2015-08-31 (×26): 500 mg via ORAL
  Filled 2015-08-24 (×34): qty 2

## 2015-08-24 MED ORDER — POLYETHYLENE GLYCOL 3350 17 G PO PACK: 17.0000 g | PACK | Freq: Every day | ORAL | Status: DC | PRN

## 2015-08-24 MED ORDER — ACETAMINOPHEN 325 MG PO TABS
650.0000 mg | ORAL_TABLET | Freq: Four times a day (QID) | ORAL | Status: DC | PRN
  Administered 2015-08-24 – 2015-08-31 (×2): 650 mg via ORAL
  Filled 2015-08-24 (×2): qty 2

## 2015-08-24 MED ORDER — ACETAMINOPHEN 325 MG PO TABS: 650.0000 mg | ORAL_TABLET | Freq: Four times a day (QID) | ORAL | Status: DC | PRN

## 2015-08-24 MED ORDER — CYCLOBENZAPRINE HCL 10 MG PO TABS
10.0000 mg | ORAL_TABLET | Freq: Once | ORAL | Status: AC
  Administered 2015-08-24: 10 mg via ORAL
  Filled 2015-08-24 (×2): qty 1

## 2015-08-24 MED ORDER — DOCUSATE SODIUM 100 MG PO CAPS: 100.0000 mg | ORAL_CAPSULE | Freq: Two times a day (BID) | ORAL | Status: DC

## 2015-08-24 MED ORDER — ALUM & MAG HYDROXIDE-SIMETH 200-200-20 MG/5ML PO SUSP: 30.0000 mL | ORAL | Status: DC | PRN

## 2015-08-24 MED ORDER — DULOXETINE HCL 30 MG PO CPEP
30.0000 mg | ORAL_CAPSULE | Freq: Every day | ORAL | Status: DC
  Administered 2015-08-25: 30 mg via ORAL
  Filled 2015-08-24 (×4): qty 1

## 2015-08-24 MED ORDER — MAGNESIUM HYDROXIDE 400 MG/5ML PO SUSP
30.0000 mL | Freq: Every day | ORAL | Status: DC | PRN
Start: 2015-08-24 — End: 2015-08-31

## 2015-08-24 MED ORDER — ASCORBIC ACID 1000 MG PO TABS
1000.0000 mg | ORAL_TABLET | Freq: Every day | ORAL | Status: DC
Start: 2015-08-24 — End: 2015-09-07

## 2015-08-24 MED ORDER — TRAZODONE HCL 50 MG PO TABS
50.0000 mg | ORAL_TABLET | Freq: Every evening | ORAL | Status: DC | PRN
  Administered 2015-08-24 – 2015-08-29 (×6): 50 mg via ORAL
  Filled 2015-08-24 (×7): qty 1

## 2015-08-24 NOTE — Clinical Social Work Psych Note (Addendum)
Patient will discharge today per MD order. Patient will discharge to: [facility] RN to call report prior to transportation to: [phone] Transportation: [facility] [phone]- [phone].  Per [facility], pick-up will be around 4:15  12:22pm- Psych CSW was notified that patient was accepted back to [facility].  No psych consult needed. Psych CSW met with patient to review disposition.  Patient is agreeable to [facility] placement and has signed the voluntary form.  Psych CSW faxed form to [facility] and placed original on the chart.  MD updated.    10:05am- Psych CSW contacted [facility] at [facility] to inquire if patient may return and inquired if psych consult is needed.  Per [facility], no psych consult is needed and patient is able to return. Bed anticipated between 2-4pm.    Nonnie Done, LCSW [phone]  5N 24-32; [place] Licensed Clinical Social Worker

## 2015-08-24 NOTE — Progress Notes (Signed)
[facility] Group Notes:  (Nursing/MHT/Case Management/Adjunct)  Date:  08/24/2015  Time:  2100  Type of Therapy:  wrap up group  Participation Level:  Active  Participation Quality:  Appropriate, Attentive, Sharing and Supportive  Affect:  Depressed and Flat  Cognitive:  Appropriate  Insight:  Improving  Engagement in Group:  Distracting  Modes of Intervention:  Clarification, Socialization and Support  Summary of Progress/Problems: Pt shared that he thought he was handling his mothers death ok and one day " I just snapped". Pt shared that he started to feel very alone. Pt plans to not isolate and look into some mental health and grieving counseling. Pt reports girlfriend moving here on Friday where they will share a place together.    Marcille BuffyMcNeil, Meosha Castanon S 08/24/2015, 9:34 PM

## 2015-08-24 NOTE — Progress Notes (Signed)
Patient ID: Benjamin HaagensenJohn [name], male   DOB: [birth year], 50 y.o.   MRN: [id number] Initial Interdisciplinary Treatment Plan   PATIENT STRESSORS: Health problems Loss of family members; death/grieving  Medication change or noncompliance   PATIENT STRENGTHS: Average or above average intelligence Capable of independent living Communication skills General fund of knowledge   PROBLEM LIST: Problem List/Patient Goals Date to be addressed Date deferred Reason deferred Estimated date of resolution  "work on my depression" 08/24/2015      "getting back on medications" 08/24/2015                                                 DISCHARGE CRITERIA:  Ability to meet basic life and health needs Improved stabilization in mood, thinking, and/or behavior Safe-care adequate arrangements made Verbal commitment to aftercare and medication compliance  PRELIMINARY DISCHARGE PLAN: Outpatient therapy  PATIENT/FAMIILY INVOLVEMENT: This treatment plan has been presented to and reviewed with the patient, Benjamin HaagensenJohn [name] .The patient and family have been given the opportunity to ask questions and make suggestions.  Aurora Maskwyman, Amed Datta E 08/24/2015, 6:19 PM

## 2015-08-24 NOTE — Discharge Instructions (Signed)
Please change your bandage daily with gauze then kerlix then a ACE wrap  Please keep your bandage clean and dry   Come to the office Wednesday June 7th at 9 am  Keep bandage clean and dry.  Call for any problems.  No smoking.  Criteria for driving a car: you should be off your pain medicine for 7-8 hours, able to drive one handed(confident), thinking clearly and feeling able in your judgement to drive. Continue elevation as it will decrease swelling.  If instructed by MD move your fingers within the confines of the bandage/splint.  Use ice if instructed by your MD. Call immediately for any sudden loss of feeling in your hand/arm or change in functional abilities of the extremity.  We recommend that you to take vitamin C 1000 mg a day to promote healing. We also recommend that if you require  pain medicine that you take a stool softener to prevent constipation as most pain medicines will have constipation side effects. We recommend either Peri-Colace or Senokot and recommend that you also consider adding MiraLAX as well to prevent the constipation affects from pain medicine if you are required to use them. These medicines are over the counter and may be purchased at a local pharmacy. A cup of yogurt and a probiotic can also be helpful during the recovery process as the medicines can disrupt your intestinal environment.
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
## 2015-08-24 NOTE — Progress Notes (Addendum)
Admit note:  [facility] to right arm.  Patient was here a week and a half ago and had compartment syndrome from cast on arm.  Patient was sent over and had extensive surgery on arm.  Patient has medical supplies in med room on [address].  Patient has been told to change dressing every day with nurse's assistance.  Patient denies SI/HI/AVH.  Patient was originally admitted to [address] for depression.  Patient's mother died December 2015 and he was the main caregiver.  His grandmother also died in 2016.  These have been stressors for them.  Patient had made a self-inflected laceration to right forearm with a blade.  Patient was using a gram of crack/cocaine daily and drinking 5-6 drinks.  Patient has completed detox protocol. Patient was oriented to [address] and given nutrition and fluids.

## 2015-08-24 NOTE — Progress Notes (Signed)
Pt discharged from unit to behavioral health. All belongings with pt. Pt in no distress and makes no c/o .

## 2015-08-25 DIAGNOSIS — R45851 Suicidal ideations: Secondary | ICD-10-CM

## 2015-08-25 DIAGNOSIS — F332 Major depressive disorder, recurrent severe without psychotic features: Secondary | ICD-10-CM

## 2015-08-25 MED ORDER — ENSURE ENLIVE PO LIQD
237.0000 mL | Freq: Three times a day (TID) | ORAL | Status: DC
  Administered 2015-08-25 – 2015-08-31 (×17): 237 mL via ORAL
  Filled 2015-08-25: qty 237

## 2015-08-25 MED ORDER — DULOXETINE HCL 20 MG PO CPEP
40.0000 mg | ORAL_CAPSULE | Freq: Every day | ORAL | Status: DC
  Filled 2015-08-25 (×4): qty 2

## 2015-08-25 MED ORDER — ADULT MULTIVITAMIN W/MINERALS CH
1.0000 | ORAL_TABLET | Freq: Every day | ORAL | Status: DC
  Administered 2015-08-25 – 2015-08-31 (×5): 1 via ORAL
  Filled 2015-08-25 (×10): qty 1

## 2015-08-25 NOTE — Progress Notes (Signed)
Recreation Therapy Notes  Date: 05.31.2017 Time: 9:30am Location: [address] Group Room   Group Topic: Stress Management  Goal Area(s) Addresses:  Patient will actively participate in stress management techniques presented during session.   Behavioral Response: Did not attend.   Benjamyn Hestand L Chole Driver, LRT/CTRS        Meika Earll L 08/25/2015 2:05 PM 
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
## 2015-08-25 NOTE — Progress Notes (Signed)
Patient ID: Benjamin HaagensenJohn [name], male   DOB: [birth year], 50 y.o.   MRN: [id number] D: Patient's self inventory sheet: patient has fair sleep, requested and recieved sleep medication.Fair  Appetite, low energy level, good concentration. Rated depression 8/10, hopeless 8/10, anxiety 7/10. SI/HI/AVH: denies today. Physical complaints are pain in back and in right arm. Goal is "feeling better". Plans to work on "thinking positive". In bed all morning, did not attend groups.   A: Medications administered, assessed medication knowledge and education given on medication regimen.  Emotional support and encouragement given patient. R: Denies SI and HI , contracts for safety. Safety maintained with 15 minute checks.

## 2015-08-25 NOTE — Progress Notes (Signed)
Adult Psychoeducational Group Note  Date:  08/25/2015 Time:  9:22 PM  Group Topic/Focus:  Wrap-Up Group:   The focus of this group is to help patients review their daily goal of treatment and discuss progress on daily workbooks.  Participation Level:  Active  Participation Quality:  Appropriate  Affect:  Appropriate  Cognitive:  Alert  Insight: Appropriate  Engagement in Group:  Engaged  Modes of Intervention:  Discussion  Additional Comments:  Patient states, "I had an okay day". Patient shared something positive is "today's my birthday". Patient goal for today is to get better.  Khale Nigh L Leilynn Pilat 08/25/2015, 9:22 PM

## 2015-08-25 NOTE — H&P (Signed)
Psychiatric Admission Assessment Adult  Patient Identification: Benjamin [name] MRN:  [id number] Date of Evaluation:  08/25/2015 Chief Complaint:  mdd single episode severe Principal Diagnosis: MDD (major depressive disorder), recurrent episode, severe ([place]) Diagnosis:   Patient Active Problem List   Diagnosis Date Noted  . MDD (major depressive disorder), recurrent episode, severe ([place]) [F33.2] 08/24/2015  . Traumatic hematoma of forearm [S50.10XA] 08/16/2015  . Traumatic hematoma of right forearm [S50.11XA] 08/15/2015  . Insomnia [G47.00]   . MDD (major depressive disorder), single episode, severe , no psychosis ([place]) [F32.2] 08/14/2015  . Cocaine use disorder, mild, abuse [F14.10] 08/14/2015  . Cannabis use disorder, mild, abuse [F12.10] 08/14/2015  . Opioid use disorder, mild, abuse [F11.10] 08/14/2015  . Suicide attempt ([place]) [T14.91] 08/10/2015  . Laceration of right arm with complication [E33.295J] 88/41/6606  . Uncontrolled hypertension [I10] 08/10/2015  . Dehydration [E86.0] 08/10/2015  . Weight loss, non-intentional [R63.4] 08/10/2015  . Depression [F32.9] 08/10/2015  . Essential hypertension [I10]   . Vaccine refused by patient [Z28.20] 05/24/2015  . Routine general medical examination at a health care facility [Z00.00] 05/24/2015  . Chronic back pain [M54.9, G89.29] 05/24/2015  . Abnormal serum creatinine level [R79.9] 05/24/2015  . Family history of cancer [Z80.9] 05/24/2015  . Screening for prostate cancer [Z12.5] 05/24/2015  . Smoker [Z72.0] 05/24/2015  . Cough [R05] 05/24/2015  . Screen for STD (sexually transmitted disease) [Z11.3] 05/24/2015  . Erectile dysfunction [N52.9] 01/31/2014   TK:ZSWF Ard is 50 year old male admitted with major depression after a suicide attempt 3 weeks ago. He currently lives in a home, that his mother passed away in. He is in the process of moving to a brand new home, with him and his fiance. He states she is very supportive, and  is moving from [place] in 1 month permanently. He has no children at this time.   Chief Compliant: I am depressed. I cut myself, tried to kill myself. I have never felt that way and I dont know what came over me. I woke up one morning and just kind of snapped. I made a long cut with a razor, and laid there for two days. I wanted some beer so I wrapped my hand up, so blood wouldn't get in my car. By me wrapping my hand up I actually saved my life. My family was calling looking for me because they knew I was going through something's, however I didn't realize until after the impact it would have on my fiance. My mom passed away in 12/125/16 with lung cancer, I was her care giver for three years. She went into hospice and died two days later. My grandmother died less than 30 days after her, I was unable to attend her funeral because I was still wrapping up things with my mom. I have been under a lot of stress and was making impulsive decisions. I was moving fast without thinking about a lot of the things I was doing.   HPI:  Below information from behavioral health assessment has been reviewed by me and I agreed with the findings.Benjamin [name] is a 50 y.o. male seen, chart reviewed and for face-to-face psychiatric consultation and evaluation. Patient has been suffering with increased symptoms of depression since he lost his mother in December 2016 for lung cancer in [place] and then lost grandmother for unspecified medical problems in January 2017 in [place]. Patient has been busy first few months taking care of mothers bills, and payments etc. Patient stated  he was offered hospice care counseling services for bereavement but patient stated he does not required at that time so gently refused. Patient later within month found himself feeling depressed, sad, missing his mother and could not focus on his work and thought about moving out of mom's home to new rental place with the help of his girlfriend who  has planning to come to [place], [place] from [place]. Patient initially started self-medicating with alcohol which was progressed to marijuana and also cocaine. Patient reported last week he was taken time off time from work and has been involved with more and more drug use. Patient reported he has one previous episode of self-injurious behavior on his left forearm required sutures in hospital in 07/24/2015. Patient reported he did lie to the physicians at that time because he does not want to be lable at psychiatric patient. Patient feels his cousin, his younger brother and girlfriend will be supportive to him. Patient stated he does not know why he become depressed and trying to kill himself. Patient blood alcohol is not significant and his urine drug screen is positive for opiates, cocaine, benzodiazepines and tetrahydrocannabinol. Past Psychiatric History: patient has no previous history of acute psychiatric hospitalization or outpatient medication management. Patient given diffuse hospice counseling services after death of his mother thinking his been strong enough and manages himself.     Drug related disorders: Cocaine use and marijuana use.  Legal History:None  Past Psychiatric History:none   Outpatient:none   Inpatient:None   Past medication trial:None   Past TD:SKAJ   Psychological testing:None  Medical Problems: Herinated discs from weight lifting injury.   Allergies:None  Surgeries:None  Head trauma:None  GOT:LXBW  Family Psychiatric history:None   Family Medical History:Mother -lung cancer, father esophageal cancer    Associated Signs/Symptoms: Depression Symptoms:  depressed mood, insomnia, psychomotor retardation, fatigue, feelings of worthlessness/guilt, difficulty concentrating, impaired memory, recurrent thoughts of death, suicidal thoughts with specific plan, suicidal attempt, anxiety, decreased appetite, (Hypo) Manic Symptoms:   Impulsivity, Anxiety Symptoms:  Excessive Worry, Panic Symptoms, Psychotic Symptoms:  None PTSD Symptoms: Negative Total Time spent with patient: 1 hour    Is the patient at risk to self? Yes.    Has the patient been a risk to self in the past 6 months? Yes.    Has the patient been a risk to self within the distant past? No.  Is the patient a risk to others? No.  Has the patient been a risk to others in the past 6 months? No.  Has the patient been a risk to others within the distant past? No.   Alcohol Screening: 1. How often do you have a drink containing alcohol?: 4 or more times a week 2. How many drinks containing alcohol do you have on a typical day when you are drinking?: 5 or 6 3. How often do you have six or more drinks on one occasion?: Daily or almost daily Preliminary Score: 6 4. How often during the last year have you found that you were not able to stop drinking once you had started?: Never 5. How often during the last year have you failed to do what was normally expected from you becasue of drinking?: Never 6. How often during the last year have you needed a first drink in the morning to get yourself going after a heavy drinking session?: Never 7. How often during the last year have you had a feeling of guilt of remorse after drinking?: Never 8.  How often during the last year have you been unable to remember what happened the night before because you had been drinking?: Never 9. Have you or someone else been injured as a result of your drinking?: No 10. Has a relative or friend or a doctor or another health worker been concerned about your drinking or suggested you cut down?: No Alcohol Use Disorder Identification Test Final Score (AUDIT): 10 Brief Intervention: Yes Substance Abuse History in the last 12 months:  Yes.   Consequences of Substance Abuse: Medical Consequences:  Liver damage, Possible death by overdose Legal Consequences:  Arrests, jail time, Loss of  driving privilege. Family Consequences:  Family discord, divorce and or separation.  Past Medical History:  Past Medical History  Diagnosis Date  . Erectile dysfunction   . Chronic back pain     managed [facility]    Past Surgical History  Procedure Laterality Date  . Ankle surgery      right  . Hand surgery      fracture, ORIG, teenage years, right.  . Lumbar epidural injection      [facility]  . Tendon repair N/A 07/24/2015    Procedure: WRIST LACERATION AND TENDON REPAIR;  Surgeon: Iran Planas, MD;  Location: [facility];  Service: Orthopedics;  Laterality: N/A;  . I&d extremity Right 08/10/2015    Procedure: IRRIGATION AND DEBRIDEMENT SKIN, SUBCUTANEOS TISSUE AND  MUSCLE, CARPAL TUNNEL RELEASE, MEDIAN NERVE LYSIS WITH NERVE WRAP;  Surgeon: Roseanne Kaufman, MD;  Location: [facility];  Service: Orthopedics;  Laterality: Right;  . Wound exploration Right 08/15/2015    Procedure: WOUND EXPLORATION right forearm;  Surgeon: Justice Britain, MD;  Location: [facility];  Service: Orthopedics;  Laterality: Right;  . Incision and drainage of wound Right 08/15/2015    Procedure: IRRIGATION AND DEBRIDEMENT WOUND;  Surgeon: Justice Britain, MD;  Location: [facility];  Service: Orthopedics;  Laterality: Right;  . Skin split graft Right 08/17/2015    Procedure: I&D RIGHT FOREARM/POSSIBLE SKIN GRAFT;  Surgeon: Roseanne Kaufman, MD;  Location: [facility];  Service: Orthopedics;  Laterality: Right;  . Application of wound vac Right 08/17/2015    Procedure: APPLICATION OF WOUND VAC;  Surgeon: Roseanne Kaufman, MD;  Location: [facility];  Service: Orthopedics;  Laterality: Right;   Family History:  Family History  Problem Relation Age of Onset  . Cancer Mother     lung  . Anxiety disorder Mother   . Cancer Father     throat  . Diabetes Paternal Grandmother   . Heart disease Neg Hx   . Stroke Neg Hx     Tobacco Screening: @FLOW ([phone])::1)@ Social History:  History  Alcohol Use  . Yes     Comment: occasional     History  Drug Use  . Yes  . Special: Marijuana, Cocaine    Comment: last use cocaine 08/08/15, last use MJ 08/08/15    Additional Social History:   Allergies:  No Known Allergies Lab Results: No results found for this or any previous visit (from the past 48 hour(s)).  Blood Alcohol level:  Lab Results  Component Value Date   ETH <5 41/05/129    Metabolic Disorder Labs:  Lab Results  Component Value Date   HGBA1C 5.8* 05/24/2015   MPG 120* 05/24/2015   No results found for: PROLACTIN Lab Results  Component Value Date   CHOL 202* 05/24/2015   TRIG 106 05/24/2015   HDL 86 05/24/2015   CHOLHDL 2.3  05/24/2015   VLDL 21 05/24/2015   LDLCALC 95 05/24/2015   LDLCALC 111 11/03/2014    Current Medications: Current Facility-Administered Medications  Medication Dose Route Frequency Provider Last Rate Last Dose  . acetaminophen (TYLENOL) tablet 650 mg  650 mg Oral Q6H PRN Niel Hummer, NP   650 mg at 08/24/15 1956  . alum & mag hydroxide-simeth (MAALOX/MYLANTA) 200-200-20 MG/5ML suspension 30 mL  30 mL Oral Q4H PRN Niel Hummer, NP      . cephALEXin (KEFLEX) capsule 500 mg  500 mg Oral Q6H Niel Hummer, NP   500 mg at 08/25/15 1610  . DULoxetine (CYMBALTA) DR capsule 30 mg  30 mg Oral Daily Niel Hummer, NP   30 mg at 08/25/15 9604  . hydrOXYzine (ATARAX/VISTARIL) tablet 25 mg  25 mg Oral Q6H PRN Niel Hummer, NP   25 mg at 08/24/15 2132  . magnesium hydroxide (MILK OF MAGNESIA) suspension 30 mL  30 mL Oral Daily PRN Niel Hummer, NP      . oxyCODONE (Oxy IR/ROXICODONE) immediate release tablet 10 mg  10 mg Oral Q6H PRN Niel Hummer, NP   10 mg at 08/25/15 0854  . traZODone (DESYREL) tablet 50 mg  50 mg Oral QHS PRN Niel Hummer, NP   50 mg at 08/24/15 2132   PTA Medications: Prescriptions prior to admission  Medication Sig Dispense Refill Last Dose  . acetaminophen (TYLENOL) 325 MG tablet Take 2 tablets (650 mg total) by mouth every 6 (six)  hours as needed for mild pain. 40 tablet 0   . cephALEXin (KEFLEX) 500 MG capsule Take 1 capsule (500 mg total) by mouth 4 (four) times daily. 28 capsule 0   . ciprofloxacin (CIPRO) 500 MG tablet Take 1 tablet (500 mg total) by mouth 2 (two) times daily. For 2 weeks beginning 08/13/15.     Marland Kitchen docusate sodium (COLACE) 100 MG capsule Take 1 capsule (100 mg total) by mouth 2 (two) times daily. (Patient not taking: Reported on 08/13/2015) 30 capsule 0   . docusate sodium (COLACE) 100 MG capsule Take 1 capsule (100 mg total) by mouth 2 (two) times daily. 10 capsule 0   . DULoxetine (CYMBALTA) 30 MG capsule Take 1 capsule (30 mg total) by mouth daily.   08/14/2015 at Unknown time  . folic acid (FOLVITE) 1 MG tablet Take 1 tablet (1 mg total) by mouth daily. (Patient not taking: Reported on 08/16/2015)     . methocarbamol (ROBAXIN) 500 MG tablet Take 1 tablet (500 mg total) by mouth every 6 (six) hours as needed for muscle spasms.   08/14/2015 at Unknown time  . Multiple Vitamin (MULTIVITAMIN WITH MINERALS) TABS tablet Take 1 tablet by mouth daily. (Patient not taking: Reported on 08/16/2015)     . oxyCODONE-acetaminophen (PERCOCET) 10-325 MG tablet   0 08/14/2015 at Unknown time  . polyethylene glycol (MIRALAX / GLYCOLAX) packet Take 17 g by mouth daily as needed for mild constipation. 14 each 0   . thiamine 100 MG tablet Take 1 tablet (100 mg total) by mouth daily. (Patient not taking: Reported on 08/16/2015)     . traZODone (DESYREL) 50 MG tablet Take 1 tablet (50 mg total) by mouth at bedtime. (Patient not taking: Reported on 08/16/2015)     . vitamin C (VITAMIN C) 1000 MG tablet Take 1 tablet (1,000 mg total) by mouth daily. 120 tablet 2     Musculoskeletal: Strength & Muscle Tone: within normal limits  Gait & Station: normal Patient leans: N/A  Psychiatric Specialty Exam: Physical Exam  ROS  Blood pressure 154/66, pulse 59, temperature 98.4 F (36.9 C), temperature source Oral, resp. rate 17, height 6'  2" (1.88 m), weight 82.555 kg (182 lb).Body mass index is 23.36 kg/(m^2).  General Appearance: Guarded  Eye Contact:  Good  Speech:  Clear and Coherent and Normal Rate  Volume:  Normal  Mood:  Depressed  Affect:  Constricted and Depressed  Thought Process:  Coherent and Goal Directed  Orientation:  Full (Time, Place, and Person)  Thought Content:  Logical, Rumination and Tangential  Suicidal Thoughts:  Yes.  with intent/plan  Homicidal Thoughts:  No  Memory:  Immediate;   Fair Recent;   Fair Remote;   Fair  Judgement:  Intact  Insight:  Lacking  Psychomotor Activity:  Normal  Concentration:  Concentration: Good  Recall:  Good  Fund of Knowledge:  Good  Language:  Good  Akathisia:  No  Handed:  Right  AIMS (if indicated):     Assets:  Communication Skills Desire for Improvement Financial Resources/Insurance Housing Leisure Time Physical Health Resilience Social Support Talents/Skills Vocational/Educational  ADL's:  Intact  Cognition:  WNL  Sleep:  Number of Hours: 4.5   Treatment Plan Summary: Daily contact with patient to assess and evaluate symptoms and progress in treatment and Medication management Patient continue to meet criteria for acute psychiatric hospitalization for increased symptoms of depression and status post suicidal attempt by self injurious behavior which required surgical intervention for suturing his left forearmMedication management for increased depression. Plan: 1. Patient was admitted to the Adult  unit at [facility] under the service of Dr. Parke Poisson. 2.  Routine labs, which include CBC, CMP, UDS, UA, and medical consultation were reviewed and routine PRN's were ordered for the patient. 3. Will maintain Q 15 minutes observation for safety.  Estimated LOS: 3-5 days.  4. During this hospitalization the patient will receive psychosocial  Assessment. 5. Patient will participate in  group, milieu, and family therapy.  Psychotherapy:Social and communication skill training, anti-bullying, learning based strategies, cognitive behavioral, and family object relations individuation separation intervention psychotherapies can be considered.  6. Due to long standing behavioral/mood problems a trial of Cymbalta was suggested to the patient. 7. [name] and parent/guardian were educated about medication efficacy and side effects.  [name] and parent/guardian agreed to the trial.  Will continue trial of Cymbalta at this time for depression. Will continue Trazodone 43m po QHS at this time. Will continue to monitor patient's mood and behavior. [facility] Hydro[facility]ine, Flexeril, and Cephalexin.  May benefit from atypical antipsychotic such as Abilify to help with major depression and impulsivity.  8. Social Work will schedule a meeting to obtain collateral information and discuss discharge and follow up plan.  Discharge concerns will also be addressed:  Safety, stabilization, and access to medication 9. This visit was of moderate complexity. It exceeded 30 minutes and 50% of this visit was spent in discussing coping mechanisms, patient's social situation, reviewing records from and  contacting family to get consent for medication and also discussing patient's presentation and obtaining history. Continue Cymbalta 30 mg daily for depression and trazodone 50 mg at bedtime for insomnia Decreased appetite- Will add Ensure po TID. Will add MVI po daily.    Observation Level/Precautions:  15 minute checks  Laboratory:  Labs obtained have been reviewed. He was discharged  from [name]. No additioanl labs are needed at this time.   Psychotherapy:  Individual and group therapy  Medications:    Consultations:    Discharge Concerns:    Estimated LOS:  Other:     I certify that inpatient services furnished can reasonably be expected to improve the  patient's condition.    Nanci Pina, FNP 5/31/201710:17 AM I have reviewed patient case with NP and have met with patient  Agree with NP note /assessement as above  24 year old man, who initially presented to hospital for self inflicted laceration to R forearm, in a suicide attempt. He reports history of depression, and exacerbation of same following death of his mother in 2015/03/23 and the death of his grandmother earlier this year. He had relocated from [place] to [place] 2-3 years ago to help take care of his mother, who had Cancer. Other stressors include recently renting a house in order to move in with his GF . Patient reports he relapsed on cocaine , cannabis earlier this year, after a period of sobriety. Patient required emergent surgical/medical treatment for compartment syndrome following self induced laceration, and was in medical unit for several days, referred to psychiatric unit once medically cleared . At this time patient reports ongoing depression, some neuro-vegetative symptoms of depression, but denies any current SI Dx- MDD , S/P suicide attempt Plan- Inpatient admission- Order BMP and CBC to follow up on prior labs , continue Cymbalta at 40 mgrs QDAY for depression. He is currently on Oxycodone PRNs for pain- will work on gradually tapering .

## 2015-08-25 NOTE — Plan of Care (Signed)
Problem: Medication: Goal: Compliance with prescribed medication regimen will improve Outcome: Progressing Pt has been compliant with medications tonight.    

## 2015-08-25 NOTE — [facility] Group Notes (Signed)
[facility]  08/25/2015 8:45 AM  Pt did not attend, declined invitation.   Chad CordialLauren Carter, LCSWA 08/25/2015 9:20 AM

## 2015-08-25 NOTE — [facility] Suicide Risk Assessment (Addendum)
[facility]   Nursing information obtained from:   patient and chart  Demographic factors:   50 year old single male  Current Mental Status:   see below  Loss Factors:   death of mother in December of 2016, more recent death of grandmother, recent [place] of state  Historical Factors:   depression, substance abuse  Risk Reduction Factors:   resilience   Total Time spent with patient: 45 minutes Principal Problem: MDD (major depressive disorder), recurrent episode, severe (HCC) Diagnosis:   Patient Active Problem List   Diagnosis Date Noted  . MDD (major depressive disorder), recurrent episode, severe (HCC) [F33.2] 08/24/2015  . Traumatic hematoma of forearm [S50.10XA] 08/16/2015  . Traumatic hematoma of right forearm [S50.11XA] 08/15/2015  . Insomnia [G47.00]   . MDD (major depressive disorder), single episode, severe , no psychosis (HCC) [F32.2] 08/14/2015  . Cocaine use disorder, mild, abuse [F14.10] 08/14/2015  . Cannabis use disorder, mild, abuse [F12.10] 08/14/2015  . Opioid use disorder, mild, abuse [F11.10] 08/14/2015  . Suicide attempt (HCC) [T14.91] 08/10/2015  . Laceration of right arm with complication [S41.111A] 08/10/2015  . Uncontrolled hypertension [I10] 08/10/2015  . Dehydration [E86.0] 08/10/2015  . Weight loss, non-intentional [R63.4] 08/10/2015  . Depression [F32.9] 08/10/2015  . Essential hypertension [I10]   . Vaccine refused by patient [Z28.20] 05/24/2015  . Routine general medical examination at a health care facility [Z00.00] 05/24/2015  . Chronic back pain [M54.9, G89.29] 05/24/2015  . Abnormal serum creatinine level [R79.9] 05/24/2015  . Family history of cancer [Z80.9] 05/24/2015  . Screening for prostate cancer [Z12.5] 05/24/2015  . Smoker [Z72.0] 05/24/2015  . Cough [R05] 05/24/2015  . Screen for STD (sexually transmitted disease) [Z11.3] 05/24/2015  . Erectile dysfunction [N52.9] 01/31/2014     Continued  Clinical Symptoms:  Alcohol Use Disorder Identification Test Final Score (AUDIT): 10 The "Alcohol Use Disorders Identification Test", Guidelines for Use in Primary Care, Second Edition.  World Science writerHealth Organization [facility](WHO). Score between 0-7:  no or low risk or alcohol related problems. Score between 8-15:  moderate risk of alcohol related problems. Score between 16-19:  high risk of alcohol related problems. Score 20 or above:  warrants further diagnostic evaluation for alcohol dependence and treatment.   CLINICAL FACTORS:  50 year old man, who initially presented to hospital for self inflicted laceration to R forearm, in a suicide attempt. He reports history of depression, and exacerbation of same following death of his mother in December of 2016 and the death of his grandmother earlier this year. He had relocated from [place] to [place] 2[place]3 years ago to help take care of his mother, who had Cancer. Other stressors include recently renting a house in order to move in with his GF . Patient reports he relapsed on cocaine , cannabis earlier this year, after a period of sobriety. Patient required emergent surgical/medical treatment for compartment syndrome following self induced laceration, and was in medical unit for several days, referred to psychiatric unit once medically cleared . At this time patient reports ongoing depression, some neuro-vegetative symptoms of depression, but denies any current SI Dx- MDD , S/P suicide attempt Plan- Inpatient admission-  Order BMP and CBC to follow up on prior labs , continue Cymbalta at 40 mgrs QDAY for depression. He is currently on Oxycodone PRNs for pain- will work on gradually tapering .    Musculoskeletal: Strength & Muscle Tone: within normal limits Gait & Station: normal Patient leans: N/A  Psychiatric Specialty  Exam: Physical Exam  ROS  Blood pressure 154/66, pulse 59, temperature 98.4 F (36.9 C), temperature source Oral, resp. rate 17, height  (1.88  m), weight 182 lb (82.555 kg).Body mass index is 23.36 kg/(m^2).  General Appearance: Fairly Groomed  Eye Contact:  Good  Speech:  Normal Rate  Volume:  Decreased  Mood:  Depressed  Affect:  Constricted  Thought Process:  Linear  Orientation:  Other:  fully alert and attentive   Thought Content:  denies hallucinations, no delusions   Suicidal Thoughts:  No- at this time patient is able to contract for safety , denies any self injurious ideations, or suicidal ideations at this time.  Homicidal Thoughts:  No  Memory:  recent and remote grossly intact   Judgement:  Fair  Insight:  Fair  Psychomotor Activity:  Decreased  Concentration:  Concentration: Good and Attention Span: Good  Recall:  Good  Fund of Knowledge:  Good  Language:  Good  Akathisia:  Negative  Handed:  Right  AIMS (if indicated):     Assets:  Desire for Improvement Resilience  ADL's:  Intact  Cognition:  WNL  Sleep:  Number of Hours: 4.5      COGNITIVE FEATURES THAT CONTRIBUTE TO RISK:  Closed-mindedness and Loss of executive function    SUICIDE RISK:   Moderate:  Frequent suicidal ideation with limited intensity, and duration, some specificity in terms of plans, no associated intent, good self-control, limited dysphoria/symptomatology, some risk factors present, and identifiable protective factors, including available and accessible social support.  PLAN OF CARE: Patient will be admitted to inpatient psychiatric unit for stabilization and safety. Will provide and encourage milieu participation. Provide medication management and maked adjustments as needed.  Will follow daily.    I certify that inpatient services furnished can reasonably be expected to improve the patient's condition.   Nehemiah Massed, MD 08/25/2015, 4:08 PM

## 2015-08-25 NOTE — Progress Notes (Signed)
Patient ID: Benjamin HaagensenJohn [name], male   DOB: 02/18/1966, 50 y.o.   MRN: [id number] D: Patient observed watching TV and interacting well with peers on approach. Pt noted to another nurse about possible discharge this weekend. Pt reports needing more time or going to a long term treatment. Pt mood and affect appeared depressed and flat. Denies HI/AVH.No behavioral issues noted.  A: Support and encouragement offered as needed. Medications administered as prescribed. Dressing changed. R: Patient cooperative and safe on unit. Will continue to monitor patient for safety and stability.

## 2015-08-25 NOTE — [facility] Group Notes (Signed)
[facility] LCSW Group Therapy 08/25/2015 1:15 PM  Type of Therapy: Group Therapy- Emotion Regulation  Pt did not attend, declined invitation.    Chad CordialLauren Carter, LCSWA 08/25/2015 3:57 PM

## 2015-08-25 NOTE — Tx Team (Signed)
Interdisciplinary Treatment Plan Update (Adult) Date: 08/25/2015   Date: 08/25/2015 1:20 PM  Progress in Treatment:  Attending groups: Pt is new to milieu, continuing to assess  Participating in groups: Pt is new to milieu, continuing to assess  Taking medication as prescribed: Yes  Tolerating medication: Yes  Family/Significant othe contact made: No, CSW assessing for appropriate contact Patient understands diagnosis: Yes AEB seeking help with depression Discussing patient identified problems/goals with staff: Yes  Medical problems stabilized or resolved: Yes  Denies suicidal/homicidal ideation: Yes Patient has not harmed self or Others: Yes   New problem(s) identified: None identified at this time.   Discharge Plan or Barriers: CSW will assess for appropriate discharge plan and relevant barriers.   Additional comments:  Patient and CSW reviewed pt's identified goals and treatment plan. Patient verbalized understanding and agreed to treatment plan. CSW reviewed [facility] "Discharge Process and Patient Involvement" Form. Pt verbalized understanding of information provided and signed form.   Reason for Continuation of Hospitalization:  Depression Medication stabilization Suicidal ideation  Estimated length of stay: 3-5 days  Review of initial/current patient goals per problem list:   1.  Goal(s): Patient will participate in aftercare plan  Met:  No  Target date: 3-5 days from date of admission   As evidenced by: Patient will participate within aftercare plan AEB aftercare provider and housing plan at discharge being identified.  08/25/15: CSW to work with Pt to assess for appropriate discharge plan and faciliate appointments and referrals as needed prior to d/c.  2.  Goal (s): Patient will exhibit decreased depressive symptoms and suicidal ideations.  Met:  No  Target date: 3-5 days from date of admission   As evidenced by: Patient will utilize self rating of depression at 3 or  below and demonstrate decreased signs of depression or be deemed stable for discharge by MD. 08/25/15: Pt was admitted with symptoms of depression, rating 10/10. Pt continues to present with flat affect and depressive symptoms.  Pt will demonstrate decreased symptoms of depression and rate depression at 3/10 or lower prior to discharge.  Attendees:  Patient:    Family:    Physician: Dr. Cobos, MD  08/25/2015 1:20 PM  Nursing: Jennifer Clark, RN Case manager  08/25/2015 1:20 PM  Clinical Social Worker  Carter, LCSWA 08/25/2015 1:20 PM  Other: Kristin Drinkard, LCSWA 08/25/2015 1:20 PM  Clinical: Marian Friedman RN 08/25/2015 1:20 PM  Other: , RN Charge Nurse 08/25/2015 1:20 PM  Other: Dolora Sutton, P4CC     Carter, LCSWA Clinical Social Work [phone]      

## 2015-08-26 DIAGNOSIS — F332 Major depressive disorder, recurrent severe without psychotic features: Secondary | ICD-10-CM | POA: Insufficient documentation

## 2015-08-26 LAB — CBC WITH DIFFERENTIAL/PLATELET
BASOS PCT: 0 %
Basophils Absolute: 0 10*3/uL (ref 0.0–0.1)
EOS ABS: 0.1 10*3/uL (ref 0.0–0.7)
EOS PCT: 1 %
HCT: 34.3 % — ABNORMAL LOW (ref 39.0–52.0)
HEMOGLOBIN: 10.9 g/dL — AB (ref 13.0–17.0)
LYMPHS ABS: 1.7 10*3/uL (ref 0.7–4.0)
Lymphocytes Relative: 25 %
MCH: 24.9 pg — AB (ref 26.0–34.0)
MCHC: 31.8 g/dL (ref 30.0–36.0)
MCV: 78.3 fL (ref 78.0–100.0)
MONO ABS: 0.5 10*3/uL (ref 0.1–1.0)
MONOS PCT: 7 %
NEUTROS PCT: 67 %
Neutro Abs: 4.5 10*3/uL (ref 1.7–7.7)
Platelets: 230 10*3/uL (ref 150–400)
RBC: 4.38 MIL/uL (ref 4.22–5.81)
RDW: 14.9 % (ref 11.5–15.5)
WBC: 6.8 10*3/uL (ref 4.0–10.5)

## 2015-08-26 LAB — BASIC METABOLIC PANEL
Anion gap: 8 (ref 5–15)
BUN: 24 mg/dL — AB (ref 6–20)
CALCIUM: 9.5 mg/dL (ref 8.9–10.3)
CHLORIDE: 102 mmol/L (ref 101–111)
CO2: 28 mmol/L (ref 22–32)
CREATININE: 1.34 mg/dL — AB (ref 0.61–1.24)
GFR calc non Af Amer: >60 mL/min (ref >60)
Glucose, Bld: 96 mg/dL (ref 65–99)
Potassium: 3.9 mmol/L (ref 3.5–5.1)
SODIUM: 138 mmol/L (ref 135–145)

## 2015-08-26 MED ORDER — DULOXETINE HCL 60 MG PO CPEP
60.0000 mg | ORAL_CAPSULE | Freq: Every day | ORAL | Status: DC
  Administered 2015-08-28: 60 mg via ORAL
  Filled 2015-08-26 (×4): qty 1

## 2015-08-26 NOTE — [facility] Group Notes (Signed)
[facility] LCSW Group Therapy 08/26/2015  1:15 PM   Type of Therapy: Group Therapy  Participation Level: Did Not Attend. Patient invited to participate but declined.   Jerren Flinchbaugh, MSW, LCSW Clinical Social Worker [facility] [phone]    

## 2015-08-26 NOTE — Plan of Care (Signed)
Problem: Activity: Goal: Interest or engagement in leisure activities will improve Outcome: Progressing Pt observed watching TV and interacting with peers.

## 2015-08-26 NOTE — Progress Notes (Signed)
CSW met with patient to discuss d/c plans. He continues to endorse SI if discharged at this time. Wants residential tx in [place] but cannot afford [facility] and not eligible due to serious SI attempt. CSW informed him about [facility] dual diagnosis program but he is hesitant to leave his wife alone in [place]. Alternative plan may be [facility]. Was getting agitated and overwhelmed when discussing d/c plans on. Expressed desire to stay here for several weeks- CSW informed him that this is unlikely. CSW offered to contact wife to discuss discharge plans, patient declines at this time.   Benjamin Fossa, LCSW Clinical Social Worker [facility] [phone]

## 2015-08-26 NOTE — Progress Notes (Signed)
[facility] MD Progress Note  08/26/2015 7:17 PM Benjamin [name]  MRN:  [id number] Subjective:   Patient reports ongoing depression, a sense of sadness, and frequent ruminations about his episode of self cutting and about his concern that laceration ( which is significant, with multiple sutures ) will leave disfiguring scar that will be a " constant reminder " . Of note, denies suicidal ideations , and contracts for safety on unit Objective: Patient case discussed with treatment team and patient seen . Patient presents depressed, anxious, and states, as above, that he continues to ruminate about his episode of self cutting. States " I feel guilty, because I should be out there helping my fiance , we just moved in to a beautiful house ". Denies psychotic symptoms and does not appear internally preoccupied . Denies medication side effects. Labs- Patient's creatinine has been consistently elevated, but improved compared to prior ( 1.34) . GFR > 60 ,  Hgb improved , to 10.9  Principal Problem: MDD (major depressive disorder), recurrent episode, severe ([place]) Diagnosis:   Patient Active Problem List   Diagnosis Date Noted  . MDD (major depressive disorder), recurrent episode, severe ([place]) [F33.2] 08/24/2015  . Traumatic hematoma of forearm [S50.10XA] 08/16/2015  . Traumatic hematoma of right forearm [S50.11XA] 08/15/2015  . Insomnia [G47.00]   . MDD (major depressive disorder), single episode, severe , no psychosis ([place]) [F32.2] 08/14/2015  . Cocaine use disorder, mild, abuse [F14.10] 08/14/2015  . Cannabis use disorder, mild, abuse [F12.10] 08/14/2015  . Opioid use disorder, mild, abuse [F11.10] 08/14/2015  . Suicide attempt ([place]) [T14.91] 08/10/2015  . Laceration of right arm with complication [W41.324M] 03/29/7251  . Uncontrolled hypertension [I10] 08/10/2015  . Dehydration [E86.0] 08/10/2015  . Weight loss, non-intentional [R63.4] 08/10/2015  . Depression [F32.9] 08/10/2015  . Essential hypertension  [I10]   . Vaccine refused by patient [Z28.20] 05/24/2015  . Routine general medical examination at a health care facility [Z00.00] 05/24/2015  . Chronic back pain [M54.9, G89.29] 05/24/2015  . Abnormal serum creatinine level [R79.9] 05/24/2015  . Family history of cancer [Z80.9] 05/24/2015  . Screening for prostate cancer [Z12.5] 05/24/2015  . Smoker [Z72.0] 05/24/2015  . Cough [R05] 05/24/2015  . Screen for STD (sexually transmitted disease) [Z11.3] 05/24/2015  . Erectile dysfunction [N52.9] 01/31/2014   Total Time spent with patient: 20 minutes    Past Medical History:  Past Medical History  Diagnosis Date  . Erectile dysfunction   . Chronic back pain     managed [facility]    Past Surgical History  Procedure Laterality Date  . Ankle surgery      right  . Hand surgery      fracture, ORIG, teenage years, right.  . Lumbar epidural injection      [facility]  . Tendon repair N/A 07/24/2015    Procedure: WRIST LACERATION AND TENDON REPAIR;  Surgeon: Iran Planas, MD;  Location: [place];  Service: Orthopedics;  Laterality: N/A;  . I&d extremity Right 08/10/2015    Procedure: IRRIGATION AND DEBRIDEMENT SKIN, SUBCUTANEOS TISSUE AND  MUSCLE, CARPAL TUNNEL RELEASE, MEDIAN NERVE LYSIS WITH NERVE WRAP;  Surgeon: Roseanne Kaufman, MD;  Location: [place];  Service: Orthopedics;  Laterality: Right;  . Wound exploration Right 08/15/2015    Procedure: WOUND EXPLORATION right forearm;  Surgeon: Justice Britain, MD;  Location: [facility];  Service: Orthopedics;  Laterality: Right;  . Incision and drainage of wound Right 08/15/2015    Procedure: IRRIGATION AND DEBRIDEMENT WOUND;  Surgeon: Justice Britain, MD;  Location: [facility];  Service: Orthopedics;  Laterality: Right;  . Skin split graft Right 08/17/2015    Procedure: I&D RIGHT FOREARM/POSSIBLE SKIN GRAFT;  Surgeon: Roseanne Kaufman, MD;  Location: [place];  Service: Orthopedics;  Laterality: Right;  . Application of wound vac  Right 08/17/2015    Procedure: APPLICATION OF WOUND VAC;  Surgeon: Roseanne Kaufman, MD;  Location: [place];  Service: Orthopedics;  Laterality: Right;   Family History:  Family History  Problem Relation Age of Onset  . Cancer Mother     lung  . Anxiety disorder Mother   . Cancer Father     throat  . Diabetes Paternal Grandmother   . Heart disease Neg Hx   . Stroke Neg Hx     Social History:  History  Alcohol Use  . Yes    Comment: occasional     History  Drug Use  . Yes  . Special: Marijuana, Cocaine    Comment: last use cocaine 08/08/15, last use MJ 08/08/15    Social History   Social History  . Marital Status: Single    Spouse Name: N/A  . Number of Children: N/A  . Years of Education: N/A   Social History Main Topics  . Smoking status: Current Every Day Smoker -- 0.50 packs/day for 6 years    Types: Cigarettes  . Smokeless tobacco: None  . Alcohol Use: Yes     Comment: occasional  . Drug Use: Yes    Special: Marijuana, Cocaine     Comment: last use cocaine 08/08/15, last use MJ 08/08/15  . Sexual Activity: Not Asked   Other Topics Concern  . 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   No children   Additional Social History:   Sleep: improved   Appetite:  Fair  Current Medications: Current Facility-Administered Medications  Medication Dose Route Frequency Provider Last Rate Last Dose  . acetaminophen (TYLENOL) tablet 650 mg  650 mg Oral Q6H PRN Niel Hummer, NP   650 mg at 08/24/15 1956  . alum & mag hydroxide-simeth (MAALOX/MYLANTA) 200-200-20 MG/5ML suspension 30 mL  30 mL Oral Q4H PRN Niel Hummer, NP      . cephALEXin (KEFLEX) capsule 500 mg  500 mg Oral Q6H Niel Hummer, NP   500 mg at 08/26/15 1707  . [START ON 08/27/2015] DULoxetine (CYMBALTA) DR capsule 60 mg  60 mg Oral Daily Ashanti Ratti A Josselin Gaulin, MD      . feeding supplement (ENSURE ENLIVE) (ENSURE ENLIVE) liquid 237 mL  237  mL Oral TID BM Nanci Pina, FNP   237 mL at 08/25/15 2131  . hydrOXYzine (ATARAX/VISTARIL) tablet 25 mg  25 mg Oral Q6H PRN Niel Hummer, NP   25 mg at 08/25/15 2118  . magnesium hydroxide (MILK OF MAGNESIA) suspension 30 mL  30 mL Oral Daily PRN Niel Hummer, NP      . multivitamin with minerals tablet 1 tablet  1 tablet Oral Daily Nanci Pina, FNP   1 tablet at 08/25/15 1158  . oxyCODONE (Oxy IR/ROXICODONE) immediate release tablet 10 mg  10 mg Oral Q6H PRN Niel Hummer, NP   10 mg at 08/26/15 1828  . traZODone (DESYREL) tablet 50 mg  50 mg Oral QHS PRN Niel Hummer, NP   50 mg at 08/25/15 2118    Lab Results:  Results for  orders placed or performed during the hospital encounter of 08/24/15 (from the past 48 hour(s))  CBC with Differential/Platelet     Status: Abnormal   Collection Time: 08/26/15  6:28 AM  Result Value Ref Range   WBC 6.8 4.0 - 10.5 K/uL   RBC 4.38 4.22 - 5.81 MIL/uL   Hemoglobin 10.9 (L) 13.0 - 17.0 g/dL   HCT 34.3 (L) 39.0 - 52.0 %   MCV 78.3 78.0 - 100.0 fL   MCH 24.9 (L) 26.0 - 34.0 pg   MCHC 31.8 30.0 - 36.0 g/dL   RDW 14.9 11.5 - 15.5 %   Platelets 230 150 - 400 K/uL   Neutrophils Relative % 67 %   Neutro Abs 4.5 1.7 - 7.7 K/uL   Lymphocytes Relative 25 %   Lymphs Abs 1.7 0.7 - 4.0 K/uL   Monocytes Relative 7 %   Monocytes Absolute 0.5 0.1 - 1.0 K/uL   Eosinophils Relative 1 %   Eosinophils Absolute 0.1 0.0 - 0.7 K/uL   Basophils Relative 0 %   Basophils Absolute 0.0 0.0 - 0.1 K/uL    Comment: Performed at [facility] metabolic panel     Status: Abnormal   Collection Time: 08/26/15  6:28 AM  Result Value Ref Range   Sodium 138 135 - 145 mmol/L   Potassium 3.9 3.5 - 5.1 mmol/L   Chloride 102 101 - 111 mmol/L   CO2 28 22 - 32 mmol/L   Glucose, Bld 96 65 - 99 mg/dL   BUN 24 (H) 6 - 20 mg/dL   Creatinine, Ser 1.34 (H) 0.61 - 1.24 mg/dL   Calcium 9.5 8.9 - 10.3 mg/dL   GFR calc non Af Amer >60 >60 mL/min   GFR calc  Af Amer >60 >60 mL/min    Comment: (NOTE) The eGFR has been calculated using the CKD EPI equation. This calculation has not been validated in all clinical situations. eGFR's persistently <60 mL/min signify possible Chronic Kidney Disease.    Anion gap 8 5 - 15    Comment: Performed at [facility]    Blood Alcohol level:  Lab Results  Component Value Date   [facility] <5 08/10/2015    Physical Findings: AIMS:  , ,  ,  ,    CIWA:    COWS:     Musculoskeletal: Strength & Muscle Tone: within normal limits Gait & Station: normal Patient leans: N/A  Psychiatric Specialty Exam: Physical Exam  ROS denies chest pain, no shortness of breath, no drainage or bleeding from wound , describes ongoing pain when making stretching movements of hand  Blood pressure 136/87, pulse 81, temperature 98.6 F (37 C), temperature source Oral, resp. rate 18, height _0  (1.88 m), weight 182 lb (82.555 kg).Body mass index is 23.36 kg/(m^2).  General Appearance: improved grooming   Eye Contact:  Good  Speech:  Normal Rate  Volume:  Normal  Mood:  Anxious and Depressed  Affect:  Constricted and Depressed  Thought Process:  Linear  Orientation:  Full (Time, Place, and Person)  Thought Content:  denies hallucinations, no delusions, ruminative   Suicidal Thoughts:  No- denies suicidal ideations at this time, but states he would feel unsafe to discharge at present, contracts for safety on the unit   Homicidal Thoughts:  No  Memory:  recent and remote grossly intact   Judgement:  Other:  improving   Insight:  improving  Psychomotor Activity:  Decreased  Concentration:  Concentration: Good and Attention Span: Good  Recall:  Good  Fund of Knowledge:  Good  Language:  Good  Akathisia:  Negative  Handed:  Left  AIMS (if indicated):     Assets:  Desire for Improvement Resilience  ADL's:  Intact  Cognition:  WNL  Sleep:  Number of Hours: 6.5   Assessment - patient remains depressed,  anxious , ruminative about the death of his mother, his recent suicide attempt. Denies current suicidal ideations, and contracts for safety on the unit, and identifies his fiance as a protective factor from suicide or self harm, but at this time states he would feel unsafe to discharge. Tolerating medications well , no side effects at this time. Treatment Plan Summary: Daily contact with patient to assess and evaluate symptoms and progress in treatment, Medication management, Plan inpatient treatment  and medications as below  Encourage increased milieu and group participation Increase Cymbalta to 60 mgrs QDAY for depression, anxiety Continue Oxycodone PRNs for pain as needed, patient agrees to gradual taper as he continues to improve  Continue Trazodone 50 mgrs QHS PRN for insomnia as needed  Continue VIstaril 25 mgrs Q 6 hours PRN for anxiety, as needed  Neita Garnet, MD 08/26/2015, 7:17 PM

## 2015-08-26 NOTE — Progress Notes (Signed)
Patient ID: Benjamin HaagensenJohn [name], male   DOB: 11/05/1965, 50 y.o.   MRN: [id number] D: Patient had a visit from girlfriend. Pt affect is a lot brighter than previous day and appeared very happy with the visit. Girlfriend helped changed dressing. Denies SI/HI/AVH and pain.No behavioral issues noted.  A: Support and encouragement offered as needed. Medications administered as prescribed. Pt wanted to take antibiotic early and head to bed. Antibiotic scheduled for midnight R: Patient cooperative and safe on unit. Will continue to monitor patient for safety and stability.

## 2015-08-26 NOTE — Progress Notes (Signed)
Patient ID: Benjamin HaagensenJohn [name], male   DOB: May 10, 1965, 50 y.o.   MRN: [id number]   Pt currently presents with a flat affect and labile behavior. Per self inventory, pt rates depression, hopelessness and anxiety at a 10. Pt reports good sleep, a poor appetite, low energy and poor concentration. Pt reports decreased appetite, did not attend breakfast or lunch at the cafeteria. Pt reports feeling increased "guilt and pressure" that he was not able to help his fiance move into their new place today. Pt verbalizes negative comments about himself.    Pt provided with medications per providers orders. Pt's labs and vitals were monitored throughout the day. Pt supported emotionally and encouraged to express concerns and questions. Pt educated on medications. Pt encouraged to turn negative self talk into positive self talk during rumination.   Pt's safety ensured with 15 minute and environmental checks. Pt endorses passive SI, states "I don't want to hurt myself but I just want it to be over." Pt currently denies HI and A/V hallucinations. Pt verbally agrees to seek staff if HI or A/VH occurs and to consult with staff before acting on any harmful thoughts. Pt reports to writer "I am doing the best I can, I am a good man. I want to live for my fiance, I am fortunate." Will continue POC.

## 2015-08-26 NOTE — [facility] Suicide Risk Assessment (Signed)
[facility] INPATIENT:  Family/Significant Other Suicide Prevention Education  Suicide Prevention Education:  Patient Refusal for Family/Significant Other Suicide Prevention Education: The patient Benjamin HaagensenJohn [name] has refused to provide written consent for family/significant other to be provided Family/Significant Other Suicide Prevention Education during admission and/or prior to discharge.  Physician notified. SPE reviewed with patient and brochure provided. Patient encouraged to return to hospital if having suicidal thoughts, patient verbalized his/her understanding and has no further questions at this time.   Carina Chaplin, [place] CarboKristin L 08/26/2015, 4:18 PM

## 2015-08-27 ENCOUNTER — Encounter (HOSPITAL_COMMUNITY): Payer: Self-pay | Admitting: Orthopedic Surgery

## 2015-08-27 NOTE — Progress Notes (Signed)
Adult Psychoeducational Group Note  Date:  08/27/2015 Time:  11:30 PM  Group Topic/Focus:  Wrap-Up Group:   The focus of this group is to help patients review their daily goal of treatment and discuss progress on daily workbooks.  Participation Level:  Active  Participation Quality:  Appropriate  Affect:  Appropriate  Cognitive:  Appropriate  Insight: Good  Engagement in Group:  Engaged  Modes of Intervention:  Activity  Additional Comments:  Patient rated his day an 8. Goal was to be more involved with peers and go to all groups.  [name] 08/27/2015, 11:30 PM

## 2015-08-27 NOTE — Progress Notes (Signed)
DAR Note: Benjamin RuizJohn has been visible on the unit today.  He was refusing some medications but agreed to take his ensure this afternoon.  He stated that he feels the best today than he has felt while he has been here.  He continues to c/o pain from his right wrist 8/10 but the pain was relieved with oxycodone.  He completed his self inventory today and reports that his depression and hopelessness are 5/10 and his anxiety is 7/10.  His goal for today is "getting more involved with my group" and he will accomplish this goal by "talk more, make new friends."  He denies SI/HI or A/V hallucinations.  Encouraged continued participation in group and unit activities.  Q 15 minute checks maintained for safety.  We will continue to monitor the progress towards his goals.

## 2015-08-27 NOTE — [facility] Group Notes (Signed)
[facility] LCSW Group Therapy 08/27/2015 1:15pm  Type of Therapy: Group Therapy- Feelings Around Relapse and Recovery  Participation Level: Minimal  Participation Quality:  Reserved but Attentive  Affect:  Appropriate  Cognitive: Alert and Oriented   Insight:  Developing   Engagement in Therapy: Developing/Improving and Engaged   Modes of Intervention: Clarification, Confrontation, Discussion, Education, Exploration, Limit-setting, Orientation, Problem-solving, Rapport Building, Reality Testing, Socialization and Support  Summary of Progress/Problems: The topic for today was feelings about relapse. The group discussed what relapse prevention is to them and identified triggers that they are on the path to relapse. Members also processed their feeling towards relapse and were able to relate to common experiences. Group also discussed coping skills that can be used for relapse prevention.  Pt attended group but did not participate in group discussion; however remained attentive throughout.   Therapeutic Modalities:   Cognitive Behavioral Therapy Solution-Focused Therapy Assertiveness Training Relapse Prevention Therapy    Trixie Maclaren Carter, LCSWA [phone] 08/27/2015 4:09 PM 

## 2015-08-27 NOTE — Progress Notes (Signed)
Pt removed dressing to right forearm.  Small amount of drainage on bandage.  No redness or swelling noted.  He would not allow RN to clean and redress as he wanted to do that himself.  We will continue to monitor.

## 2015-08-27 NOTE — Progress Notes (Signed)
[facility] MD Progress Note  08/27/2015 1:10 PM Benjamin [name]  MRN:  [id number] Subjective:   Pt states " I still feel down and depressed , as though I have no energy . But I was not taking my medications properly. '  Objective: Patient case discussed with treatment team and patient seen . Patient today continues to present as depressed, anxious, and is tearful , ruminates about his mother who passed away recently. Pt became very tearful when he talked about her. Reports he is working on coping techniques towards recovery , however progress has been slow. Denies psychotic symptoms and does not appear internally preoccupied . Denies medication side effects other than mild drowsiness.   Principal Problem: MDD (major depressive disorder), recurrent episode, severe ([place]) Diagnosis:   Patient Active Problem List   Diagnosis Date Noted  . Severe episode of recurrent major depressive disorder, without psychotic features ([place]) [F33.2]   . MDD (major depressive disorder), recurrent episode, severe ([place]) [F33.2] 08/24/2015  . Traumatic hematoma of forearm [S50.10XA] 08/16/2015  . Traumatic hematoma of right forearm [S50.11XA] 08/15/2015  . Insomnia [G47.00]   . MDD (major depressive disorder), single episode, severe , no psychosis ([place]) [F32.2] 08/14/2015  . Cocaine use disorder, mild, abuse [F14.10] 08/14/2015  . Cannabis use disorder, mild, abuse [F12.10] 08/14/2015  . Opioid use disorder, mild, abuse [F11.10] 08/14/2015  . Suicide attempt ([place]) [T14.91] 08/10/2015  . Laceration of right arm with complication [B63.845X] 64/68/0321  . Uncontrolled hypertension [I10] 08/10/2015  . Dehydration [E86.0] 08/10/2015  . Weight loss, non-intentional [R63.4] 08/10/2015  . Depression [F32.9] 08/10/2015  . Essential hypertension [I10]   . Vaccine refused by patient [Z28.20] 05/24/2015  . Routine general medical examination at a health care facility [Z00.00] 05/24/2015  . Chronic back pain [M54.9, G89.29]  05/24/2015  . Abnormal serum creatinine level [R79.9] 05/24/2015  . Family history of cancer [Z80.9] 05/24/2015  . Screening for prostate cancer [Z12.5] 05/24/2015  . Smoker [Z72.0] 05/24/2015  . Cough [R05] 05/24/2015  . Screen for STD (sexually transmitted disease) [Z11.3] 05/24/2015  . Erectile dysfunction [N52.9] 01/31/2014   Total Time spent with patient: 25 minutes    Past Medical History:  Past Medical History  Diagnosis Date  . Erectile dysfunction   . Chronic back pain     managed [facility]    Past Surgical History  Procedure Laterality Date  . Ankle surgery      right  . Hand surgery      fracture, ORIG, teenage years, right.  . Lumbar epidural injection      [facility]  . Tendon repair N/A 07/24/2015    Procedure: WRIST LACERATION AND TENDON REPAIR;  Surgeon: Iran Planas, MD;  Location: [place];  Service: Orthopedics;  Laterality: N/A;  . Wound exploration Right 08/15/2015    Procedure: WOUND EXPLORATION right forearm;  Surgeon: Justice Britain, MD;  Location: [facility];  Service: Orthopedics;  Laterality: Right;  . Incision and drainage of wound Right 08/15/2015    Procedure: IRRIGATION AND DEBRIDEMENT WOUND;  Surgeon: Justice Britain, MD;  Location: [facility];  Service: Orthopedics;  Laterality: Right;  . Skin split graft Right 08/17/2015    Procedure: I&D RIGHT FOREARM/POSSIBLE SKIN GRAFT;  Surgeon: Roseanne Kaufman, MD;  Location: [place];  Service: Orthopedics;  Laterality: Right;  . Application of wound vac Right 08/17/2015    Procedure: APPLICATION OF WOUND VAC;  Surgeon: Roseanne Kaufman, MD;  Location: [place];  Service: Orthopedics;  Laterality:  Right;  . I&d extremity Right 08/10/2015    Procedure: IRRIGATION AND DEBRIDEMENT SKIN, SUBCUTANEOS TISSUE AND  MUSCLE, CARPAL TUNNEL RELEASE, MEDIAN NERVE LYSIS WITH NERVE WRAP;  Surgeon: Roseanne Kaufman, MD;  Location: [place];  Service: Orthopedics;  Laterality: Right;   Family History:  Family History   Problem Relation Age of Onset  . Cancer Mother     lung  . Anxiety disorder Mother   . Cancer Father     throat  . Diabetes Paternal Grandmother   . Heart disease Neg Hx   . Stroke Neg Hx     Social History:  History  Alcohol Use  . Yes    Comment: occasional     History  Drug Use  . Yes  . Special: Marijuana, Cocaine    Comment: last use cocaine 08/08/15, last use MJ 08/08/15    Social History   Social History  . Marital Status: Single    Spouse Name: N/A  . Number of Children: N/A  . Years of Education: N/A   Social History Main Topics  . Smoking status: Current Every Day Smoker -- 0.50 packs/day for 6 years    Types: Cigarettes  . Smokeless tobacco: None  . Alcohol Use: Yes     Comment: occasional  . Drug Use: Yes    Special: Marijuana, Cocaine     Comment: last use cocaine 08/08/15, last use MJ 08/08/15  . Sexual Activity: Not Asked   Other Topics Concern  . 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   No children   Additional Social History:   Sleep: improved   Appetite:  Fair  Current Medications: Current Facility-Administered Medications  Medication Dose Route Frequency Provider Last Rate Last Dose  . acetaminophen (TYLENOL) tablet 650 mg  650 mg Oral Q6H PRN Niel Hummer, NP   650 mg at 08/24/15 1956  . alum & mag hydroxide-simeth (MAALOX/MYLANTA) 200-200-20 MG/5ML suspension 30 mL  30 mL Oral Q4H PRN Niel Hummer, NP      . cephALEXin (KEFLEX) capsule 500 mg  500 mg Oral Q6H Niel Hummer, NP   500 mg at 08/27/15 1208  . DULoxetine (CYMBALTA) DR capsule 60 mg  60 mg Oral Daily Myer Peer Cobos, MD   60 mg at 08/27/15 0800  . feeding supplement (ENSURE ENLIVE) (ENSURE ENLIVE) liquid 237 mL  237 mL Oral TID BM Nanci Pina, FNP   237 mL at 08/26/15 2150  . hydrOXYzine (ATARAX/VISTARIL) tablet 25 mg  25 mg Oral Q6H PRN Niel Hummer, NP   25 mg at 08/25/15 2118  .  magnesium hydroxide (MILK OF MAGNESIA) suspension 30 mL  30 mL Oral Daily PRN Niel Hummer, NP      . multivitamin with minerals tablet 1 tablet  1 tablet Oral Daily Nanci Pina, FNP   1 tablet at 08/25/15 1158  . oxyCODONE (Oxy IR/ROXICODONE) immediate release tablet 10 mg  10 mg Oral Q6H PRN Niel Hummer, NP   10 mg at 08/27/15 1254  . traZODone (DESYREL) tablet 50 mg  50 mg Oral QHS PRN Niel Hummer, NP   50 mg at 08/26/15 2149    Lab Results:  Results for orders placed or performed during the hospital encounter of 08/24/15 (from the past 48 hour(s))  CBC with Differential/Platelet     Status: Abnormal   Collection Time: 08/26/15  6:28  AM  Result Value Ref Range   WBC 6.8 4.0 - 10.5 K/uL   RBC 4.38 4.22 - 5.81 MIL/uL   Hemoglobin 10.9 (L) 13.0 - 17.0 g/dL   HCT 34.3 (L) 39.0 - 52.0 %   MCV 78.3 78.0 - 100.0 fL   MCH 24.9 (L) 26.0 - 34.0 pg   MCHC 31.8 30.0 - 36.0 g/dL   RDW 14.9 11.5 - 15.5 %   Platelets 230 150 - 400 K/uL   Neutrophils Relative % 67 %   Neutro Abs 4.5 1.7 - 7.7 K/uL   Lymphocytes Relative 25 %   Lymphs Abs 1.7 0.7 - 4.0 K/uL   Monocytes Relative 7 %   Monocytes Absolute 0.5 0.1 - 1.0 K/uL   Eosinophils Relative 1 %   Eosinophils Absolute 0.1 0.0 - 0.7 K/uL   Basophils Relative 0 %   Basophils Absolute 0.0 0.0 - 0.1 K/uL    Comment: Performed at [facility] metabolic panel     Status: Abnormal   Collection Time: 08/26/15  6:28 AM  Result Value Ref Range   Sodium 138 135 - 145 mmol/L   Potassium 3.9 3.5 - 5.1 mmol/L   Chloride 102 101 - 111 mmol/L   CO2 28 22 - 32 mmol/L   Glucose, Bld 96 65 - 99 mg/dL   BUN 24 (H) 6 - 20 mg/dL   Creatinine, Ser 1.34 (H) 0.61 - 1.24 mg/dL   Calcium 9.5 8.9 - 10.3 mg/dL   GFR calc non Af Amer >60 >60 mL/min   GFR calc Af Amer >60 >60 mL/min    Comment: (NOTE) The eGFR has been calculated using the CKD EPI equation. This calculation has not been validated in all clinical  situations. eGFR's persistently <60 mL/min signify possible Chronic Kidney Disease.    Anion gap 8 5 - 15    Comment: Performed at [facility]    Blood Alcohol level:  Lab Results  Component Value Date   [facility] <5 08/10/2015    Physical Findings: AIMS:  , ,  ,  ,    CIWA:    COWS:     Musculoskeletal: Strength & Muscle Tone: within normal limits Gait & Station: normal Patient leans: N/A  Psychiatric Specialty Exam: Physical Exam  Review of Systems  Psychiatric/Behavioral: Positive for depression and suicidal ideas. The patient is nervous/anxious.   All other systems reviewed and are negative.  denies chest pain, no shortness of breath, no drainage or bleeding from wound , describes ongoing pain when making stretching movements of hand  Blood pressure 158/95, pulse 80, temperature 98.6 F (37 C), temperature source Oral, resp. rate 20, height _0  (1.88 m), weight 82.555 kg (182 lb).Body mass index is 23.36 kg/(m^2).  General Appearance: improved grooming   Eye Contact:  Good  Speech:  Normal Rate  Volume:  Normal  Mood:  Anxious and Depressed  Affect:  Depressed and Tearful  Thought Process:  Linear  Orientation:  Full (Time, Place, and Person)  Thought Content:  denies hallucinations, no delusions, ruminative   Suicidal Thoughts:  Yes.  without intent/plan is s/p self inflicted cut to forearm  Homicidal Thoughts:  No  Memory:  recent and remote grossly intact , immediate - fair  Judgement:  Other:  improving   Insight:  improving  Psychomotor Activity:  Decreased  Concentration:  Concentration: Good and Attention Span: Good  Recall:  Good  Fund of Knowledge:  Good  Language:  Good  Akathisia:  Negative  Handed:  Left  AIMS (if indicated):     Assets:  Desire for Improvement Resilience  ADL's:  Intact  Cognition:  WNL  Sleep:  Number of Hours: 6.5   Assessment - patient remains depressed, anxious ,tearful ,  ruminative about the death of  his mother, his recent suicide attempt. Does report some on going suicidal thoughts , but is able to contract for safety. Will continue treatment. Treatment Plan Summary: Daily contact with patient to assess and evaluate symptoms and progress in treatment, Medication management, Plan inpatient treatment  and medications as below  Encourage increased milieu and group participation Increased Cymbalta to 60 mg po  QDAY for depression, anxiety Continue Oxycodone PRNs for pain as needed, patient agrees to gradual taper as he continues to improve  Continue Trazodone 50 mg po  QHS PRN for insomnia as needed  Continue VIstaril 25 mg po  Q 6 hours PRN for anxiety, as needed . CSW will continue to work on after care - pt is interested in IOP/CBT.  Victoire Deans, MD 08/27/2015, 1:10 PM

## 2015-08-28 MED ORDER — SERTRALINE HCL 25 MG PO TABS
25.0000 mg | ORAL_TABLET | Freq: Every day | ORAL | Status: DC
  Administered 2015-08-29: 25 mg via ORAL
  Filled 2015-08-28 (×3): qty 1

## 2015-08-28 MED ORDER — HYDROXYZINE HCL 50 MG PO TABS
50.0000 mg | ORAL_TABLET | Freq: Three times a day (TID) | ORAL | Status: DC | PRN
  Administered 2015-08-28 – 2015-08-29 (×2): 50 mg via ORAL
  Filled 2015-08-28 (×2): qty 1

## 2015-08-28 NOTE — Progress Notes (Signed)
Benjamin [name] is seen OOB UAL on the 400 hall today..he looks relaxed. He is appropriate. He speaks very softly. A He takes his scheduled meds as ordered. He completed his daily assessment this morning and on it he wrote he deneid SI and he rated his depression, hopelessness and anxiety " 7/7/2", respectively. R Safety in place. Pt stated he likes to do his own wouldn care and RN requested pt allow her to look at wound.

## 2015-08-28 NOTE — Progress Notes (Signed)
D: Patient pleasant and cooperative with care, but does endorse depression with a dull, flat affect. Pt had a good visit with his fiance' and states he "will never do anything like this again to hurt himself or disappoint her". Pt denies SI/HI or plans to harm himself currently. Pt out in the milieu among peers this evening with no inappropriate behaviors noted. A: Q 15 minute safety checks, encourage staff/peer interaction and group participation; administer medications as ordered. R: Patient compliant with HS medications and group session. No complaints.

## 2015-08-28 NOTE — [facility] Group Notes (Signed)
[facility] LCSW Group Therapy  08/28/2015 10:00 AM  Type of Therapy:  Group Therapy  Participation Level:  Active  Participation Quality:  Appropriate and Attentive  Affect:  Appropriate  Cognitive:  Alert, Appropriate and Oriented  Insight:  Improving  Engagement in Therapy:  Engaged  Modes of Intervention:  Discussion  Summary of Progress/Problems: Group today was about "Show and Tell." Participants were able to discuss and develop process to engage several parts of their identity for self-actualization and goal planning. Patients were able to discuss how they identify themselves, how their past has defined them and how others perceive them. Patients engaged in identifying goals to reflect how they can use the concepts discussed in creating appropriate plans to achieve goals. Patient was engaged with group and shared his plan to begin taking better care of himself after spending so much time pleasing other people.   Beverly SessionsLINDSEY, Benjamin [name] 08/28/2015, 1:06 PM

## 2015-08-28 NOTE — Progress Notes (Signed)
[facility] Group Notes:  (Nursing/MHT/Case Management/Adjunct)  Date:  08/28/2015  Time:  11:46 PM  Type of Therapy:  Psychoeducational Skills  Participation Level:  Active  Participation Quality:  Appropriate  Affect:  Appropriate  Cognitive:  Appropriate  Insight:  Good  Engagement in Group:  Engaged  Modes of Intervention:  Education  Summary of Progress/Problems: The patient shared in group that he had a good day overall and that he was beginning to feel better. He states that he is feeling less depressed. As a theme for the day, his coping skill will be to "think positively".   Hazle CocaGOODMAN, Surya Folden S 08/28/2015, 11:46 PM

## 2015-08-28 NOTE — Progress Notes (Signed)
[facility]  08/28/2015 9:29 AM Benjamin HaagensenJohn [name]  MRN:  [id number]  Subjective: Patient reports " I am okay, I want to change my Cymbalta to Zoloft because my girlfriend told me too."  Objective:Benjamin [name] is awake, alert and oriented X4.seen resting in bedroom.  Denies suicidal or homicidal ideation. Denies auditory or visual hallucination and does not appear to be responding to internal stimuli. Patient reports interacting well with staff and others. Patient reports he is medication compliant without mediation side effects. However is requesting to stop taken the Cymbalta. States that his girlfriend told him that Zoloft medication for depression. ( Patient provided with education on Zoloft and Cymbalta) patient states " I trust my girlfriend and I would like to change my medications. States his depression 2/10.  Reports good appetite and states he was able to resting well on last night .  Support, encouragement and reassurance was provided.   Noted 08/27/2015 Labs- Patient's creatinine has been consistently elevated, but improved compared to prior ( 1.34) . GFR > 60 ,  Hgb improved , to 10.9  Principal Problem: MDD (major depressive disorder), recurrent episode, severe (HCC) Diagnosis:   Patient Active Problem List   Diagnosis Date Noted  . Severe episode of recurrent major depressive disorder, without psychotic features (HCC) [F33.2]   . MDD (major depressive disorder), recurrent episode, severe (HCC) [F33.2] 08/24/2015  . Traumatic hematoma of forearm [S50.10XA] 08/16/2015  . Traumatic hematoma of right forearm [S50.11XA] 08/15/2015  . Insomnia [G47.00]   . MDD (major depressive disorder), single episode, severe , no psychosis (HCC) [F32.2] 08/14/2015  . Cocaine use disorder, mild, abuse [F14.10] 08/14/2015  . Cannabis use disorder, mild, abuse [F12.10] 08/14/2015  . Opioid use disorder, mild, abuse [F11.10] 08/14/2015  . Suicide attempt (HCC) [T14.91] 08/10/2015  . Laceration of  right arm with complication [S41.111A] 08/10/2015  . Uncontrolled hypertension [I10] 08/10/2015  . Dehydration [E86.0] 08/10/2015  . Weight loss, non-intentional [R63.4] 08/10/2015  . Depression [F32.9] 08/10/2015  . Essential hypertension [I10]   . Vaccine refused by patient [Z28.20] 05/24/2015  . Routine general medical examination at a health care facility [Z00.00] 05/24/2015  . Chronic back pain [M54.9, G89.29] 05/24/2015  . Abnormal serum creatinine level [R79.9] 05/24/2015  . Family history of cancer [Z80.9] 05/24/2015  . Screening for prostate cancer [Z12.5] 05/24/2015  . Smoker [Z72.0] 05/24/2015  . Cough [R05] 05/24/2015  . Screen for STD (sexually transmitted disease) [Z11.3] 05/24/2015  . Erectile dysfunction [N52.9] 01/31/2014   Total Time spent with patient: 20 minutes    Past Medical History:  Past Medical History  Diagnosis Date  . Erectile dysfunction   . Chronic back pain     [facility]    Past Surgical History  Procedure Laterality Date  . Ankle surgery      right  . Hand surgery      fracture, ORIG, teenage years, right.  . Lumbar epidural injection      [facility]  . Tendon repair N/A 07/24/2015    Procedure: WRIST LACERATION AND TENDON REPAIR;  Surgeon: Bradly BienenstockFred Ortmann, MD;  Location: [facility];  Service: Orthopedics;  Laterality: N/A;  . Wound exploration Right 08/15/2015    Procedure: WOUND EXPLORATION right forearm;  Surgeon: Francena HanlyKevin Supple, MD;  Location: [facility]S;  Service: Orthopedics;  Laterality: Right;  . Incision and drainage of wound Right 08/15/2015    Procedure: IRRIGATION AND DEBRIDEMENT WOUND;  Surgeon: Francena HanlyKevin Supple, MD;  [facility];  Service: Orthopedics;  Laterality: Right;  . Skin split graft Right 08/17/2015    Procedure: I&D RIGHT FOREARM/POSSIBLE SKIN GRAFT;  Surgeon: Dominica Severin, MD;  Location: [facility];  Service: Orthopedics;  Laterality: Right;  . Application of wound vac Right 08/17/2015     Procedure: APPLICATION OF WOUND VAC;  Surgeon: Dominica Severin, MD;  [facility];  Service: Orthopedics;  Laterality: Right;  . I&d extremity Right 08/10/2015    Procedure: IRRIGATION AND DEBRIDEMENT SKIN, SUBCUTANEOS TISSUE AND  MUSCLE, CARPAL TUNNEL RELEASE, MEDIAN NERVE LYSIS WITH NERVE WRAP;  Surgeon: Dominica Severin, MD;  Location: [facility];  Service: Orthopedics;  Laterality: Right;   Family History:  Family History  Problem Relation Age of Onset  . Cancer Mother     lung  . Anxiety disorder Mother   . Cancer Father     throat  . Diabetes Paternal Grandmother   . Heart disease Neg Hx   . Stroke Neg Hx     Social History:  History  Alcohol Use  . Yes    Comment: occasional     History  Drug Use  . Yes  . Special: Marijuana, Cocaine    Comment: last use cocaine 08/08/15, last use MJ 08/08/15    Social History   Social History  . Marital Status: Single    Spouse Name: N/A  . Number of Children: N/A  . Years of Education: N/A   Social History Main Topics  . Smoking status: Current Every Day Smoker -- 0.50 packs/day for 6 years    Types: Cigarettes  . Smokeless tobacco: None  . Alcohol Use: Yes     Comment: occasional  . Drug Use: Yes    Special: Marijuana, Cocaine     Comment: last use cocaine 08/08/15, last use MJ 08/08/15  . Sexual Activity: Not Asked   Other Topics Concern  . 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   No children   Additional Social History:   Sleep: improved   Appetite:  Fair  Current Medications: Current Facility-Administered Medications  Medication Dose Route Frequency Provider Last Rate Last Dose  . acetaminophen (TYLENOL) tablet 650 mg  650 mg Oral Q6H PRN Thermon Leyland, NP   650 mg at 08/24/15 1956  . alum & mag hydroxide-simeth (MAALOX/MYLANTA) 200-200-20 MG/5ML suspension 30 mL  30 mL Oral Q4H PRN Thermon Leyland, NP      . cephALEXin (KEFLEX) capsule 500  mg  500 mg Oral Q6H Thermon Leyland, NP   500 mg at 08/28/15 1308  . DULoxetine (CYMBALTA) DR capsule 60 mg  60 mg Oral Daily Craige Cotta, MD   60 mg at 08/28/15 0825  . feeding supplement (ENSURE ENLIVE) (ENSURE ENLIVE) liquid 237 mL  237 mL Oral TID BM Truman Hayward, FNP   237 mL at 08/27/15 2022  . hydrOXYzine (ATARAX/VISTARIL) tablet 25 mg  25 mg Oral Q6H PRN Thermon Leyland, NP   25 mg at 08/27/15 2116  . magnesium hydroxide (MILK OF MAGNESIA) suspension 30 mL  30 mL Oral Daily PRN Thermon Leyland, NP      . multivitamin with minerals tablet 1 tablet  1 tablet Oral Daily Truman Hayward, FNP   1 tablet at 08/28/15 0825  . oxyCODONE (Oxy IR/ROXICODONE) immediate release tablet 10 mg  10 mg Oral Q6H PRN Thermon Leyland, NP   10  mg at 08/27/15 2022  . traZODone (DESYREL) tablet 50 mg  50 mg Oral QHS PRN Thermon Leyland, NP   50 mg at 08/27/15 2157    Lab Results:  No results found for this or any previous visit (from the past 48 hour(s)).  Blood Alcohol level:  Lab Results  Component Value Date   ETH <5 08/10/2015    Physical Findings: AIMS:  , ,  ,  ,    CIWA:    COWS:     Musculoskeletal: Strength & Muscle Tone: within normal limits Gait & Station: normal Patient leans: N/A  Psychiatric Specialty Exam: Physical Exam  Nursing note and vitals reviewed. Constitutional: He is oriented to person, place, and time. He appears well-developed.  Neurological: He is alert and oriented to person, place, and time.  Psychiatric: He has a normal mood and affect. His behavior is normal.    Review of Systems  Psychiatric/Behavioral: Positive for depression. Negative for suicidal ideas. The patient is nervous/anxious.    denies chest pain, no shortness of breath, no drainage or bleeding from wound , describes ongoing pain when making stretching movements of hand  Blood pressure 120/76, pulse 73, temperature 98.9 F (37.2 C), temperature source Oral, resp. rate 16, height  (1.88 m), weight  82.555 kg (182 lb).Body mass index is 23.36 kg/(m^2).  General Appearance: Casual  Eye Contact:  Good  Speech:  Normal Rate  Volume:  Normal  Mood:  Anxious and Depressed  Affect:  Congruent  Thought Process:  Linear  Orientation:  Full (Time, Place, and Person)  Thought Content:  denies hallucinations, no delusions, ruminative   Suicidal Thoughts:  No- denies suicidal ideations at this time, but states   Homicidal Thoughts:  No  Memory:  recent and remote grossly intact   Judgement:  Other:  improving   Insight:  improving  Psychomotor Activity:  Decreased  Concentration:  Concentration: Good and Attention Span: Good  Recall:  Good  Fund of Knowledge:  Good  Language:  Good  Akathisia:  Negative  Handed:  Left  AIMS (if indicated):     Assets:  Desire for Improvement Resilience  ADL's:  Intact  Cognition:  WNL  Sleep:  Number of Hours: 6.75   I agree with current treatment plan on 08/28/2015, Patient seen face-to-face for psychiatric evaluation follow-up, chart reviewed and case discussed with the  Reviewed the information documented and agree with the treatment plan.  Treatment Plan Summary: Daily contact with patient to assess and evaluate symptoms and progress in treatment, Medication management, Plan inpatient treatment  and medications as below   Encourage increased milieu and group participation Start Zoloft 25 mg PO QD for depression/anxiety. (with titration)  Discontinue to 60 mgrs QDAY for depression, anxiety Continue Oxycodone PRNs for pain as needed, patient agrees to gradual taper as he continues to improve  Continue Trazodone 50 mgrs QHS PRN for insomnia as needed  Continue Vistaril 25 mgrs Q 6 hours PRN for anxiety, as needed. Start Vistaril 50 mg PO QHS for insomnia.  Oneta Rack, NP 08/28/2015, 9:29 AM I reviewed chart and agreed with the findings and treatment Plan.  Kathryne Sharper, MD

## 2015-08-28 NOTE — Progress Notes (Signed)
Writer has observed patient up in th e dayroom interacting with minimal peers. He reports that this is the best he has felt since being here. Patient reports that he plans to attend more groups during the day but has been sleeping a lot and is not sure if it is his medication that is causing the drowsiness. Writer encouraged him to try and attend more groups during the day and to inquire about his feeling tired and sleepy. Safety maintained on unit with 15 min checks.

## 2015-08-29 MED ORDER — SERTRALINE HCL 50 MG PO TABS
50.0000 mg | ORAL_TABLET | Freq: Every day | ORAL | Status: DC
  Administered 2015-08-30: 50 mg via ORAL
  Filled 2015-08-29 (×3): qty 1

## 2015-08-29 NOTE — Progress Notes (Addendum)
Adult Psychoeducational Group Note  Date:  08/29/2015 Time:  0930-1000  Group Topic/Focus:  Self Care:   The focus of this group is to help patients understand the importance of self-care in order to improve or restore emotional, physical, spiritual, interpersonal, and financial health.  Participation Level:  Active  Participation Quality:  Appropriate, Attentive, Sharing and Supportive  Affect:  Appropriate  Cognitive:  Alert, Appropriate and Oriented  Insight: Good  Engagement in Group:  Supportive  Modes of Intervention:  Clarification, Rapport Building, Socialization and Support  Additional Comments:  Benjamin RuizJohn shared that sometimes it is difficult to care for yourself when you have a lot of responsibilities and others to care for.  He filled out a self-care inventory and a goal was set to identify a goal that addresses one of the self-care needs (emotional, physical, interpersonal, spiritual, or psychological) after discharge home.  Benjamin [name] 08/29/2015, 11:45 AM

## 2015-08-29 NOTE — Progress Notes (Signed)
[facility] MD Progress Note  08/29/2015 11:43 AM Benjamin [name]  MRN:  [id number]  Subjective: Patient reports " I am having a good day today. I just don't trust myself to leave here."  Objective:Benjamin [name] is awake, alert and oriented X4. seen talking on the telephone.  Denies suicidal or homicidal ideation. Denies auditory or visual hallucination and does not appear to be responding to internal stimuli. Patient reports interacting well with staff and others. Patient reports he is medication compliant without mediation side effects. Patient denies side effects from current mediation change. Patient reports he doesn't feel safe going home at this time. Patient reports his depression 5/10 today.  Reports good appetite and states he was able to resting well on last night. Support, encouragement and reassurance was provided.  Of Note 07/28/2015- However is requesting to stop taken the Cymbalta. States that his girlfriend told him that Zoloft medication for depression. ( Patient provided with education on Zoloft and Cymbalta).  Noted 08/27/2015 Labs- Patient's creatinine has been consistently elevated, but improved compared to prior ( 1.34) . GFR > 60 ,  Hgb improved , to 10.9  Principal Problem: MDD (major depressive disorder), recurrent episode, severe (HCC) Diagnosis:   Patient Active Problem List   Diagnosis Date Noted  . Severe episode of recurrent major depressive disorder, without psychotic features (HCC) [F33.2]   . MDD (major depressive disorder), recurrent episode, severe (HCC) [F33.2] 08/24/2015  . Traumatic hematoma of forearm [S50.10XA] 08/16/2015  . Traumatic hematoma of right forearm [S50.11XA] 08/15/2015  . Insomnia [G47.00]   . MDD (major depressive disorder), single episode, severe , no psychosis (HCC) [F32.2] 08/14/2015  . Cocaine use disorder, mild, abuse [F14.10] 08/14/2015  . Cannabis use disorder, mild, abuse [F12.10] 08/14/2015  . Opioid use disorder, mild, abuse [F11.10]  08/14/2015  . Suicide attempt (HCC) [T14.91] 08/10/2015  . Laceration of right arm with complication [S41.111A] 08/10/2015  . Uncontrolled hypertension [I10] 08/10/2015  . Dehydration [E86.0] 08/10/2015  . Weight loss, non-intentional [R63.4] 08/10/2015  . Depression [F32.9] 08/10/2015  . Essential hypertension [I10]   . Vaccine refused by patient [Z28.20] 05/24/2015  . Routine general medical examination at a health care facility [Z00.00] 05/24/2015  . Chronic back pain [M54.9, G89.29] 05/24/2015  . Abnormal serum creatinine level [R79.9] 05/24/2015  . Family history of cancer [Z80.9] 05/24/2015  . Screening for prostate cancer [Z12.5] 05/24/2015  . Smoker [Z72.0] 05/24/2015  . Cough [R05] 05/24/2015  . Screen for STD (sexually transmitted disease) [Z11.3] 05/24/2015  . Erectile dysfunction [N52.9] 01/31/2014   Total Time spent with patient: 20 minutes    Past Medical History:  Past Medical History  Diagnosis Date  . Erectile dysfunction   . Chronic back pain     [facility]    Past Surgical History  Procedure Laterality Date  . Ankle surgery      right  . Hand surgery      fracture, ORIG, teenage years, right.  . Lumbar epidural injection      [facility]  . Tendon repair N/A 07/24/2015    Procedure: WRIST LACERATION AND TENDON REPAIR;  Surgeon: Bradly Bienenstock, MD;  Location: [facility];  Service: Orthopedics;  Laterality: N/A;  . Wound exploration Right 08/15/2015    Procedure: WOUND EXPLORATION right forearm;  Surgeon: Francena Hanly, MD;  Location: [facility];  Service: Orthopedics;  Laterality: Right;  . Incision and drainage of wound Right 08/15/2015    Procedure: IRRIGATION AND DEBRIDEMENT WOUND;  Surgeon: Francena Hanly, MD;  Location: [facility];  Service: Orthopedics;  Laterality: Right;  . Skin split graft Right 08/17/2015    Procedure: I&D RIGHT FOREARM/POSSIBLE SKIN GRAFT;  Surgeon: Dominica Severin, MD;  Location: [facility];  Service:  Orthopedics;  Laterality: Right;  . Application of wound vac Right 08/17/2015    Procedure: APPLICATION OF WOUND VAC;  Surgeon: Dominica Severin, MD;  Location: [facility];  Service: Orthopedics;  Laterality: Right;  . I&d extremity Right 08/10/2015    Procedure: IRRIGATION AND DEBRIDEMENT SKIN, SUBCUTANEOS TISSUE AND  MUSCLE, CARPAL TUNNEL RELEASE, MEDIAN NERVE LYSIS WITH NERVE WRAP;  Surgeon: Dominica Severin, MD;  Location: [facility];  Service: Orthopedics;  Laterality: Right;   Family History:  Family History  Problem Relation Age of Onset  . Cancer Mother     lung  . Anxiety disorder Mother   . Cancer Father     throat  . Diabetes Paternal Grandmother   . Heart disease Neg Hx   . Stroke Neg Hx     Social History:  History  Alcohol Use  . Yes    Comment: occasional     History  Drug Use  . Yes  . Special: Marijuana, Cocaine    Comment: last use cocaine 08/08/15, last use MJ 08/08/15    Social History   Social History  . Marital Status: Single    Spouse Name: N/A  . Number of Children: N/A  . Years of Education: N/A   Social History Main Topics  . Smoking status: Current Every Day Smoker -- 0.50 packs/day for 6 years    Types: Cigarettes  . Smokeless tobacco: None  . Alcohol Use: Yes     Comment: occasional  . Drug Use: Yes    Special: Marijuana, Cocaine     Comment: last use cocaine 08/08/15, last use MJ 08/08/15  . Sexual Activity: Not Asked   Other Topics Concern  . 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   No children   Additional Social History:   Sleep: improved   Appetite:  Fair  Current Medications: Current Facility-Administered Medications  Medication Dose Route Frequency Provider Last Rate Last Dose  . acetaminophen (TYLENOL) tablet 650 mg  650 mg Oral Q6H PRN Thermon Leyland, NP   650 mg at 08/24/15 1956  . alum & mag hydroxide-simeth (MAALOX/MYLANTA) 200-200-20 MG/5ML suspension 30  mL  30 mL Oral Q4H PRN Thermon Leyland, NP      . cephALEXin (KEFLEX) capsule 500 mg  500 mg Oral Q6H Thermon Leyland, NP   500 mg at 08/29/15 0610  . feeding supplement (ENSURE ENLIVE) (ENSURE ENLIVE) liquid 237 mL  237 mL Oral TID BM Truman Hayward, FNP   237 mL at 08/28/15 2046  . hydrOXYzine (ATARAX/VISTARIL) tablet 25 mg  25 mg Oral Q6H PRN Thermon Leyland, NP   25 mg at 08/27/15 2116  . hydrOXYzine (ATARAX/VISTARIL) tablet 50 mg  50 mg Oral TID PRN Oneta Rack, NP   50 mg at 08/28/15 2044  . magnesium hydroxide (MILK OF MAGNESIA) suspension 30 mL  30 mL Oral Daily PRN Thermon Leyland, NP      . multivitamin with minerals tablet 1 tablet  1 tablet Oral Daily Truman Hayward, FNP   1 tablet at 08/29/15 0914  . oxyCODONE (Oxy IR/ROXICODONE) immediate release tablet 10 mg  10 mg Oral Q6H PRN  Thermon LeylandLaura A Davis, NP   10 mg at 08/29/15 [id number]  . sertraline (ZOLOFT) tablet 25 mg  25 mg Oral Daily Oneta Rackanika N Lewis, NP   25 mg at 08/29/15 0914  . traZODone (DESYREL) tablet 50 mg  50 mg Oral QHS PRN Thermon LeylandLaura A Davis, NP   50 mg at 08/28/15 2044    Lab Results:  No results found for this or any previous visit (from the past 48 hour(s)).  Blood Alcohol level:  Lab Results  Component Value Date   ETH <5 08/10/2015    Physical Findings: AIMS:  , ,  ,  ,    CIWA:    COWS:     Musculoskeletal: Strength & Muscle Tone: within normal limits Gait & Station: normal Patient leans: N/A  Psychiatric Specialty Exam: Physical Exam  Nursing note and vitals reviewed. Constitutional: He is oriented to person, place, and time. He appears well-developed.  Neurological: He is alert and oriented to person, place, and time.  Psychiatric: He has a normal mood and affect. His behavior is normal.    Review of Systems  Psychiatric/Behavioral: Positive for depression. Negative for suicidal ideas. The patient is nervous/anxious.    denies chest pain, no shortness of breath, no drainage or bleeding from wound , describes  ongoing pain when making stretching movements of hand  Blood pressure 120/66, pulse 68, temperature 98.5 F (36.9 C), temperature source Oral, resp. rate 16, height 6\' 2"  (1.88 m), weight 82.555 kg (182 lb).Body mass index is 23.36 kg/(m^2).  General Appearance: Casual and Fairly Groomed  Eye Contact:  Good  Speech:  Normal Rate  Volume:  Normal  Mood:  Euthymic  Affect:  Congruent  Thought Process:  Linear  Orientation:  Full (Time, Place, and Person)  Thought Content:  denies hallucinations, no delusions, ruminative   Suicidal Thoughts:  No- denies suicidal ideations at this time, but states   Homicidal Thoughts:  No  Memory:  recent and remote grossly intact   Judgement:  Other:  improving   Insight:  improving  Psychomotor Activity:  Decreased  Concentration:  Concentration: Good and Attention Span: Good  Recall:  Good  Fund of Knowledge:  Good  Language:  Good  Akathisia:  Negative  Handed:  Left  AIMS (if indicated):     Assets:  Desire for Improvement Resilience  ADL's:  Intact  Cognition:  WNL  Sleep:  Number of Hours: 6.5   I agree with current treatment plan on 08/27/2015, Patient seen face-to-face for psychiatric evaluation follow-up, chart reviewed and case discussed with the  Reviewed the information documented and agree with the treatment plan.  Treatment Plan Summary: Daily contact with patient to assess and evaluate symptoms and progress in treatment, Medication management, Plan inpatient treatment  and medications as below   Encourage increased milieu and group participation Increase Zoloft 25 mg to 50 mgs  PO QD for depression/anxiety. (with titration)  Discontinue to 60 mgrs QDAY for depression, anxiety Continue Oxycodone PRNs for pain as needed, patient agrees to gradual taper as he continues to improve  Continue Trazodone 50 mgrs QHS PRN for insomnia as needed  Continue Vistaril 25 mgrs Q 6 hours PRN for anxiety, as needed. Start Vistaril 50 mg PO QHS for  insomnia.  Oneta Rackanika N Lewis, NP 08/29/2015, 11:43 AM  I reviewed chart and agreed with the findings and treatment Plan.  Kathryne SharperSyed Jagar Lua, MD

## 2015-08-29 NOTE — Progress Notes (Signed)
[facility] Group Notes:  (Nursing/MHT/Case Management/Adjunct)  Date:  08/29/2015  Time:  9:52 PM  Type of Therapy:  Psychoeducational Skills  Participation Level:  Active  Participation Quality:  Appropriate  Affect:  Appropriate  Cognitive:  Appropriate  Insight:  Good  Engagement in Group:  Engaged  Modes of Intervention:  Education  Summary of Progress/Problems: The patient shared in group that he had a good day overall and that he had a good visit with his fiance. As a theme for the day, the patient's support system will be comprised of his fiance.   Hazle CocaGOODMAN, Jailey Booton S 08/29/2015, 9:52 PM

## 2015-08-29 NOTE — [facility] Group Notes (Signed)
[facility] Group Notes:  (Clinical Social Work)   08/29/2015   1:15-2:15PM  Summary of Progress/Problems:   The main focus of today's process group was to   1)  Examine first impressions;  2)  Discuss what characteristics, beyond people's first impressions, are important to know;  3)  Look at the supports being viewed differently when only the superficial first impressions are considered;  4)  Attempt to integrate a broader view of supports, accepting the limits of support they may offer; and  5)  Acknowledge the need to increase professional supports.  The patient expressed gratitude that people in the room saw him as kind and caring.  He agreed with CSW that he is also deeply sad, and talked openly about how he took care of his mother for the last 3 years of her life with cancer, and how he had to do things that one should not have to do.  He discussed the painfulness of having to do this alone, and how devastating it was to find himself alone after her death.  He discussed it being Mother's Day when he cut his arm so badly.    Type of Therapy:  Process Group with Motivational Interviewing  Participation Level:  Active  Participation Quality:  Attentive, Sharing and Supportive  Affect:  Blunted and Depressed  Cognitive:  Alert and Appropriate  Insight:  Engaged and Supportive  Engagement in Therapy:  Engaged  Modes of Intervention:   Education, Support and Processing, Activity  Benjamin MantleMareida Grossman-Orr, LCSW 08/29/2015    4:17 PM

## 2015-08-29 NOTE — Progress Notes (Signed)
Benjamin [name] is seen standing at the nurses station at 0730 this morning. He is anxious. HE says " I want to get my dressing on my arm changed Can you do it for me?" A He completes his daily assessment and on it he wrote he deneid SI and he rated his depression, hopelessness and anxiety " 7/6/3", respectively. Writer assisted pt to remove the kerlex off his arm, incision is observed, intact, sutures approximated, wound edges are uneven , from multiple surgeries. Drg applied without diff, pt tolerated procedure well and Benjamin [name] afterwards, without diff.

## 2015-08-30 LAB — CBC WITH DIFFERENTIAL/PLATELET
BASOS PCT: 0 %
Basophils Absolute: 0 10*3/uL (ref 0.0–0.1)
EOS ABS: 0.1 10*3/uL (ref 0.0–0.7)
EOS PCT: 2 %
HCT: 32.4 % — ABNORMAL LOW (ref 39.0–52.0)
Hemoglobin: 10.4 g/dL — ABNORMAL LOW (ref 13.0–17.0)
Lymphocytes Relative: 41 %
Lymphs Abs: 2 10*3/uL (ref 0.7–4.0)
MCH: 25.4 pg — AB (ref 26.0–34.0)
MCHC: 32.1 g/dL (ref 30.0–36.0)
MCV: 79 fL (ref 78.0–100.0)
MONO ABS: 0.5 10*3/uL (ref 0.1–1.0)
MONOS PCT: 11 %
Neutro Abs: 2.2 10*3/uL (ref 1.7–7.7)
Neutrophils Relative %: 46 %
Platelets: 219 10*3/uL (ref 150–400)
RBC: 4.1 MIL/uL — ABNORMAL LOW (ref 4.22–5.81)
RDW: 14.4 % (ref 11.5–15.5)
WBC: 4.8 10*3/uL (ref 4.0–10.5)

## 2015-08-30 MED ORDER — SERTRALINE HCL 25 MG PO TABS
75.0000 mg | ORAL_TABLET | Freq: Every day | ORAL | Status: DC
  Administered 2015-08-31: 75 mg via ORAL
  Filled 2015-08-30 (×3): qty 3

## 2015-08-30 MED ORDER — HYDROXYZINE HCL 25 MG PO TABS
50.0000 mg | ORAL_TABLET | Freq: Every evening | ORAL | Status: DC | PRN
  Administered 2015-08-30: 50 mg via ORAL
  Filled 2015-08-30: qty 1

## 2015-08-30 NOTE — Progress Notes (Signed)
  D: Pt was observed interacting with a visitor. Pt instructed the writer that visitor was his fiance. Stated they have been together since they were 50 yrs old. Stated he knows he's not ready for discharge but [facility]". Stated, "I have a lot to be thankful for. I have a wife at home, and a beautiful house. But I've got to take care of me. It would be selfish to go back to her like this", referring to his mental state. Pt has no other questions or concerns.   A:  Support and encouragement was offered. 15 min checks continued for safety.  R: Pt remains safe.

## 2015-08-30 NOTE — Consult Note (Signed)
WOC wound consult note Reason for Consult: Wound to inferior aspect of left forearm. Patient to see Dr. Amanda PeaGramig later this week for suture removal. Wound type: traumatic Pressure Ulcer POA: No Measurement:20cm x 0.4cm at the widest portion. Wound ONG:EXBMWUXbed:Closely approximated incision with sutures, some of which have become dislodged. 7cm from the most distal portion, there is a 2.5cm section of minor separation.  Pink granulation tissue is evident. Drainage (amount, consistency, odor) moderate amount of serosanguinous exudate Periwound:Intact, no induration, warmth or excessive erythema. Dressing procedure/placement/frequency: Per Dr. Carlos Leveringgramig's instructions:  Shower daily.  Pat arm gently dry. Cleanse wound with NS, pat dry.  Cover with dry gauze, secure with tape.  Lightly compress with 4-inch ACE wrap.  Change daily. WOC nursing team will not follow, but will remain available to this patient, the nursing and medical teams.  Please re-consult if needed. Thanks, Ladona MowLaurie Riyanna Crutchley, MSN, RN, GNP, Hans EdenCWOCN, CWON-AP, FAAN  Pager# [phone]

## 2015-08-30 NOTE — Progress Notes (Addendum)
D: Patient is lying in bed.  He has been attending group and participating in his treatment.  He states, "I believe I feel better.  I don't believe my depression is as bad as it was."  Patient rates his depression as a 6; hopelessness as a 5; anxiety as a 3.  His sleep is fair.  His goal today is to "stop having bad thoughts."   He is hoping for a family meeting with his finance which will be set up today.  Patient is concerned because a couple of his stitches have come out of his right arm.  He is to have them removed on Wednesday.  He denies any self harm thoughts and has not exhibited any behaviors of same.  He denies HI/AVH.  Patient will see a hospitalist today regarding stitches.   A: Continue to monitor medication management and MD orders.  Safety checks completed every 15 minutes per protocol.  Offer support and encouragement as needed. R: Patient is receptive to staff; his behavior is appropriate.

## 2015-08-30 NOTE — [facility] Group Notes (Signed)
   [facility] LCS[facility] Discharge Planning Group Note  08/30/2015  8:45 AM   Participation Quality: Alert, Appropriate and Oriented  Mood/Affect: Depressed and Flat  Depression Rating: 6-7  Anxiety Rating: Reports low anxiety  Thoughts of Suicide: Pt denies SI/HI  Will you contract for safety? Yes  Current AVH: Pt denies  Plan for Discharge/Comments: Pt attended discharge planning group and actively participated in group. CSW provided pt with today's workbook. Patient reports interest between residential and outpatient treatment. He is requesting family meeting with fiance. He reports some improvement in symptoms, has been attending groups regularly.  Transportation Means: Pt reports access to transportation  Supports: No supports mentioned at this time  Samuella BruinKristin Wilfrido Luedke, MSW, Johnson [employer] JohnsonLCSW Clinical Social Worker [facility] [phone]

## 2015-08-30 NOTE — Progress Notes (Signed)
Patient ID: Benjamin [name], male   DOB: March 31, 1965, 50 y.o.   MRN: [id number] [facility] MD Progress Note  08/30/2015 1:30 PM Mubashir Mallek  MRN:  [id number]  Subjective: Patient reports he is feeling better, less severely depressed, and feels that today he is having " a better day" than he has had in the recent past. Still feels depressed, but denies suicidal ideations . Denies medication side effects. Objective : I have discussed case with treatment team and have met with patient .  As noted, patient less severely depressed, and reporting gradual improvement . However, he does present somewhat constricted in affect and continues to report some neuro-vegetative symptoms of depression such as sense of anhedonia, low energy. Patient reports some exudate from his forearm wound , and also reports that one of his sutures have dehisced - I inspected wound- no severe erythema or swelling, but slight exudate noted - on bandage- no odor noted. Distal movement and sensation are preserved, no bleeding . Patient is on Keflex , denies side effects- also denies fever , chills . Going to some groups , visible in milieu. Denies any current SI, contracts for safety on unit, going to groups .  Principal Problem: MDD (major depressive disorder), recurrent episode, severe ([place]) Diagnosis:   Patient Active Problem List   Diagnosis Date Noted  . Severe episode of recurrent major depressive disorder, without psychotic features ([place]) [F33.2]   . MDD (major depressive disorder), recurrent episode, severe ([place]) [F33.2] 08/24/2015  . Traumatic hematoma of forearm [S50.10XA] 08/16/2015  . Traumatic hematoma of right forearm [S50.11XA] 08/15/2015  . Insomnia [G47.00]   . MDD (major depressive disorder), single episode, severe , no psychosis ([place]) [F32.2] 08/14/2015  . Cocaine use disorder, mild, abuse [F14.10] 08/14/2015  . Cannabis use disorder, mild, abuse [F12.10] 08/14/2015  . Opioid use disorder, mild, abuse [F11.10]  08/14/2015  . Suicide attempt ([place]) [T14.91] 08/10/2015  . Laceration of right arm with complication [H60.737T] 09/20/9483  . Uncontrolled hypertension [I10] 08/10/2015  . Dehydration [E86.0] 08/10/2015  . Weight loss, non-intentional [R63.4] 08/10/2015  . Depression [F32.9] 08/10/2015  . Essential hypertension [I10]   . Vaccine refused by patient [Z28.20] 05/24/2015  . Routine general medical examination at a health care facility [Z00.00] 05/24/2015  . Chronic back pain [M54.9, G89.29] 05/24/2015  . Abnormal serum creatinine level [R79.9] 05/24/2015  . Family history of cancer [Z80.9] 05/24/2015  . Screening for prostate cancer [Z12.5] 05/24/2015  . Smoker [Z72.0] 05/24/2015  . Cough [R05] 05/24/2015  . Screen for STD (sexually transmitted disease) [Z11.3] 05/24/2015  . Erectile dysfunction [N52.9] 01/31/2014   Total Time spent with patient: 20 minutes    Past Medical History:  Past Medical History  Diagnosis Date  . Erectile dysfunction   . Chronic back pain     managed [facility]    Past Surgical History  Procedure Laterality Date  . Ankle surgery      right  . Hand surgery      fracture, ORIG, teenage years, right.  . Lumbar epidural injection      [facility]  . Tendon repair N/A 07/24/2015    Procedure: WRIST LACERATION AND TENDON REPAIR;  Surgeon: Iran Planas, MD;  Location: [place];  Service: Orthopedics;  Laterality: N/A;  . Wound exploration Right 08/15/2015    Procedure: WOUND EXPLORATION right forearm;  Surgeon: Justice Britain, MD;  Location: [facility];  Service: Orthopedics;  Laterality: Right;  . Incision and drainage of wound  Right 08/15/2015    Procedure: IRRIGATION AND DEBRIDEMENT WOUND;  Surgeon: Justice Britain, MD;  Location: [facility];  Service: Orthopedics;  Laterality: Right;  . Skin split graft Right 08/17/2015    Procedure: I&D RIGHT FOREARM/POSSIBLE SKIN GRAFT;  Surgeon: Roseanne Kaufman, MD;  Location: [place];  Service:  Orthopedics;  Laterality: Right;  . Application of wound vac Right 08/17/2015    Procedure: APPLICATION OF WOUND VAC;  Surgeon: Roseanne Kaufman, MD;  Location: [place];  Service: Orthopedics;  Laterality: Right;  . I&d extremity Right 08/10/2015    Procedure: IRRIGATION AND DEBRIDEMENT SKIN, SUBCUTANEOS TISSUE AND  MUSCLE, CARPAL TUNNEL RELEASE, MEDIAN NERVE LYSIS WITH NERVE WRAP;  Surgeon: Roseanne Kaufman, MD;  Location: [place];  Service: Orthopedics;  Laterality: Right;   Family History:  Family History  Problem Relation Age of Onset  . Cancer Mother     lung  . Anxiety disorder Mother   . Cancer Father     throat  . Diabetes Paternal Grandmother   . Heart disease Neg Hx   . Stroke Neg Hx     Social History:  History  Alcohol Use  . Yes    Comment: occasional     History  Drug Use  . Yes  . Special: Marijuana, Cocaine    Comment: last use cocaine 08/08/15, last use MJ 08/08/15    Social History   Social History  . Marital Status: Single    Spouse Name: N/A  . Number of Children: N/A  . Years of Education: N/A   Social History Main Topics  . Smoking status: Current Every Day Smoker -- 0.50 packs/day for 6 years    Types: Cigarettes  . Smokeless tobacco: None  . Alcohol Use: Yes     Comment: occasional  . Drug Use: Yes    Special: Marijuana, Cocaine     Comment: last use cocaine 08/08/15, last use MJ 08/08/15  . Sexual Activity: Not Asked   Other Topics Concern  . 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   No children   Additional Social History:   Sleep: improved   Appetite:  Improving   Current Medications: Current Facility-Administered Medications  Medication Dose Route Frequency Provider Last Rate Last Dose  . acetaminophen (TYLENOL) tablet 650 mg  650 mg Oral Q6H PRN Niel Hummer, NP   650 mg at 08/24/15 1956  . alum & mag hydroxide-simeth (MAALOX/MYLANTA) 200-200-20 MG/5ML  suspension 30 mL  30 mL Oral Q4H PRN Niel Hummer, NP      . cephALEXin (KEFLEX) capsule 500 mg  500 mg Oral Q6H Niel Hummer, NP   500 mg at 08/30/15 1133  . feeding supplement (ENSURE ENLIVE) (ENSURE ENLIVE) liquid 237 mL  237 mL Oral TID BM Nanci Pina, FNP   237 mL at 08/30/15 0816  . hydrOXYzine (ATARAX/VISTARIL) tablet 25 mg  25 mg Oral Q6H PRN Niel Hummer, NP   25 mg at 08/27/15 2116  . hydrOXYzine (ATARAX/VISTARIL) tablet 50 mg  50 mg Oral QHS PRN Jenne Campus, MD      . magnesium hydroxide (MILK OF MAGNESIA) suspension 30 mL  30 mL Oral Daily PRN Niel Hummer, NP      . multivitamin with minerals tablet 1 tablet  1 tablet Oral Daily Nanci Pina, FNP   1 tablet at 08/30/15 0815  . oxyCODONE (Oxy IR/ROXICODONE) immediate release  tablet 10 mg  10 mg Oral Q6H PRN Niel Hummer, NP   10 mg at 08/30/15 1328  . sertraline (ZOLOFT) tablet 50 mg  50 mg Oral Daily Derrill Center, NP   50 mg at 08/30/15 0815    Lab Results:  No results found for this or any previous visit (from the past 48 hour(s)).  Blood Alcohol level:  Lab Results  Component Value Date   ETH <5 08/10/2015    Physical Findings: AIMS:  , ,  ,  ,    CIWA:    COWS:     Musculoskeletal: Strength & Muscle Tone: within normal limits Gait & Station: normal Patient leans: N/A  Psychiatric Specialty Exam: Physical Exam  Nursing note and vitals reviewed. Constitutional: He is oriented to person, place, and time. He appears well-developed.  Neurological: He is alert and oriented to person, place, and time.  Psychiatric: He has a normal mood and affect. His behavior is normal.    Review of Systems  Psychiatric/Behavioral: Positive for depression. Negative for suicidal ideas. The patient is nervous/anxious.    denies chest pain, no shortness of breath, no drainage or bleeding from wound , describes ongoing pain when making stretching movements of hand  Blood pressure 120/66, pulse 68, temperature 98.5 F  (36.9 C), temperature source Oral, resp. rate 16, height 6' 2"  (1.88 m), weight 182 lb (82.555 kg).Body mass index is 23.36 kg/(m^2).  General Appearance: Fairly Groomed  Eye Contact:  Good  Speech:  Normal Rate  Volume:  Normal  Mood:  Less depressed, feeling partially better   Affect:  Constricted but reactive   Thought Process:  Linear  Orientation:  Full (Time, Place, and Person)  Thought Content:  denies hallucinations, no delusions, ruminative   Suicidal Thoughts:  No- denies suicidal ideations at this time, denies self injurious ideations   Homicidal Thoughts:  No  Memory:  recent and remote grossly intact   Judgement:  Other:  improving   Insight:  improving  Psychomotor Activity:  Improving   Concentration:  Concentration: Good and Attention Span: Good  Recall:  Good  Fund of Knowledge:  Good  Language:  Good  Akathisia:  Negative  Handed:  Left  AIMS (if indicated):     Assets:  Desire for Improvement Resilience  ADL's:  Intact  Cognition:  WNL  Sleep:  Number of Hours: 6   Assessment - patient reports improving mood and is less severely depressed, but continues to report some lingering neuro-vegetative symptoms of depression- at this time denies suicidal ideations. Denies medication side effects. Of note, there is some exudate on wound, but no fever, no chills and as of last CBC no increased WBC.  Of note, patient reports he does not tolerate Trazodone well , prefers Vistaril PRN for insomnia  Treatment Plan Summary: Daily contact with patient to assess and evaluate symptoms and progress in treatment, Medication management, Plan inpatient treatment  and medications as below   Encourage increased milieu and group participation Increase Zoloft to  50 mgs  PO QDAY  for depression/anxiety. Continue Oxycodone PRNs for pain as needed, patient agrees to gradual taper as he continues to improve  D/C  Trazodone  Continue Vistaril 25 mgrs Q 6 hours PRN for anxiety, as  needed. Continue  Vistaril 50 mg PO QHS for insomnia, as needed  I have discussed case with hospitalist consultant regarding wound management - at this time recommendation is to  Repeat CBC , continue Keflex, dressing  changes, and  Monitor  Check CBC with differential. Neita Garnet, MD 08/30/2015, 1:30 PM    Berniece Andreas, MD

## 2015-08-30 NOTE — Progress Notes (Signed)
Adult Psychoeducational Group Note  Date:  08/30/2015 Time:  9:03 PM  Group Topic/Focus:  Wrap-Up Group:   The focus of this group is to help patients review their daily goal of treatment and discuss progress on daily workbooks.  Participation Level:  Active  Participation Quality:  Appropriate  Affect:  Appropriate  Cognitive:  Alert  Insight: Appropriate  Engagement in Group:  Engaged  Modes of Intervention:  Discussion  Additional Comments:  Patient states, "I had a pretty good day". Patient goal for today was to think positive. Patient met goal.   Jivan Symanski L Ziyana Morikawa 08/30/2015, 9:03 PM

## 2015-08-31 ENCOUNTER — Emergency Department (HOSPITAL_COMMUNITY)
Admission: EM | Admit: 2015-08-31 | Disposition: A | Discharge status: Other Acute Inpatient Hospital | Payer: BLUE CROSS/BLUE SHIELD | Source: Home / Self Care

## 2015-08-31 ENCOUNTER — Encounter (HOSPITAL_COMMUNITY): Payer: Self-pay | Admitting: Emergency Medicine

## 2015-08-31 MED ORDER — MAGNESIUM HYDROXIDE 400 MG/5ML PO SUSP: 30.0000 mL | Freq: Every day | ORAL | Status: DC | PRN

## 2015-08-31 MED ORDER — ACETAMINOPHEN 325 MG PO TABS
650.0000 mg | ORAL_TABLET | Freq: Four times a day (QID) | ORAL | Status: DC | PRN
  Administered 2015-09-01 – 2015-09-07 (×10): 650 mg via ORAL
  Filled 2015-08-31 (×11): qty 2

## 2015-08-31 MED ORDER — ADULT MULTIVITAMIN W/MINERALS CH
1.0000 | ORAL_TABLET | Freq: Every day | ORAL | Status: DC
  Administered 2015-09-01 – 2015-09-07 (×7): 1 via ORAL
  Filled 2015-08-31 (×8): qty 1

## 2015-08-31 MED ORDER — SERTRALINE HCL 50 MG PO TABS
75.0000 mg | ORAL_TABLET | Freq: Every day | ORAL | Status: DC
  Administered 2015-09-01: 75 mg via ORAL
  Filled 2015-08-31 (×3): qty 1

## 2015-08-31 MED ORDER — OXYCODONE HCL 5 MG PO TABS
10.0000 mg | ORAL_TABLET | Freq: Four times a day (QID) | ORAL | Status: DC | PRN
  Administered 2015-08-31 – 2015-09-01 (×3): 10 mg via ORAL
  Filled 2015-08-31 (×3): qty 2

## 2015-08-31 MED ORDER — ENSURE ENLIVE PO LIQD
237.0000 mL | Freq: Two times a day (BID) | ORAL | Status: DC
  Administered 2015-08-31 – 2015-09-07 (×13): 237 mL via ORAL

## 2015-08-31 MED ORDER — HYDROXYZINE HCL 25 MG PO TABS
25.0000 mg | ORAL_TABLET | Freq: Four times a day (QID) | ORAL | Status: DC | PRN
Start: 2015-08-31 — End: 2015-09-04

## 2015-08-31 MED ORDER — HYDROXYZINE HCL 50 MG PO TABS
50.0000 mg | ORAL_TABLET | Freq: Every evening | ORAL | Status: DC | PRN
  Administered 2015-08-31 – 2015-09-03 (×4): 50 mg via ORAL
  Filled 2015-08-31 (×4): qty 1

## 2015-08-31 MED ORDER — ALUM & MAG HYDROXIDE-SIMETH 200-200-20 MG/5ML PO SUSP: 30.0000 mL | Freq: Four times a day (QID) | ORAL | Status: DC | PRN

## 2015-08-31 NOTE — ED Notes (Signed)
Liz BeachBrian B. [place] ortho PA made aware pt is in triage 9.  Arlys JohnBrian is in room 9 seeing another pt at present.

## 2015-08-31 NOTE — Progress Notes (Signed)
Patient ID: Benjamin [name], male   DOB: [birth year], 50 y.o.   MRN: [id number] [facility] MD Progress Note  08/31/2015 4:53 PM Benjamin [name]  MRN:  [id number]  Subjective: patient reports some improvement , states still feels depressed, but to lesser degree than before and states that last night slept better . Also states he is feeling a little more optimistic about his future, and reports visits from GF have been helping him to feel better as well. Pain on wound has decreased partially. At this time does not endorse medication side effects. Objective : I have discussed case with treatment team and have met with patient .  Patient presents with partially improved mood and improved range of affect- less severely depressed . Denies medication side effects. More visible on unit, going to some groups, no disruptive behaviors on unit. Wound evaluated by Wound RN, felt to be progressing, healing well at this time- scheduled for suture removal later today, somewhat nervous about this, but responds well to support and encouragement . Denies suicidal ideations at this time .  Labs  ( CBC ) reviewed - no leukocytosis, differential unremarkable, Anemia stable .  Principal Problem: MDD (major depressive disorder), recurrent episode, severe ([place]) Diagnosis:   Patient Active Problem List   Diagnosis Date Noted  . Severe episode of recurrent major depressive disorder, without psychotic features ([place]) [F33.2]   . MDD (major depressive disorder), recurrent episode, severe ([place]) [F33.2] 08/24/2015  . Traumatic hematoma of forearm [S50.10XA] 08/16/2015  . Traumatic hematoma of right forearm [S50.11XA] 08/15/2015  . Insomnia [G47.00]   . MDD (major depressive disorder), single episode, severe , no psychosis ([place]) [F32.2] 08/14/2015  . Cocaine use disorder, mild, abuse [F14.10] 08/14/2015  . Cannabis use disorder, mild, abuse [F12.10] 08/14/2015  . Opioid use disorder, mild, abuse [F11.10] 08/14/2015  . Suicide  attempt ([place]) [T14.91] 08/10/2015  . Laceration of right arm with complication [O67.672C] 94/70/9628  . Uncontrolled hypertension [I10] 08/10/2015  . Dehydration [E86.0] 08/10/2015  . Weight loss, non-intentional [R63.4] 08/10/2015  . Depression [F32.9] 08/10/2015  . Essential hypertension [I10]   . Vaccine refused by patient [Z28.20] 05/24/2015  . Routine general medical examination at a health care facility [Z00.00] 05/24/2015  . Chronic back pain [M54.9, G89.29] 05/24/2015  . Abnormal serum creatinine level [R79.9] 05/24/2015  . Family history of cancer [Z80.9] 05/24/2015  . Screening for prostate cancer [Z12.5] 05/24/2015  . Smoker [Z72.0] 05/24/2015  . Cough [R05] 05/24/2015  . Screen for STD (sexually transmitted disease) [Z11.3] 05/24/2015  . Erectile dysfunction [N52.9] 01/31/2014   Total Time spent with patient: 20 minutes    Past Medical History:  Past Medical History  Diagnosis Date  . Erectile dysfunction   . Chronic back pain     [facility]    Past Surgical History  Procedure Laterality Date  . Ankle surgery      right  . Hand surgery      fracture, ORIG, teenage years, right.  . Lumbar epidural injection      [facility]  . Tendon repair N/A 07/24/2015    Procedure: WRIST LACERATION AND TENDON REPAIR;  Surgeon: Iran Planas, MD;  Location: [place];  Service: Orthopedics;  Laterality: N/A;  . Wound exploration Right 08/15/2015    Procedure: WOUND EXPLORATION right forearm;  Surgeon: Justice Britain, MD;  [facility];  Service: Orthopedics;  Laterality: Right;  . Incision and drainage of wound Right 08/15/2015    Procedure: IRRIGATION  AND DEBRIDEMENT WOUND;  Surgeon: Justice Britain, MD;  [facility];  Service: Orthopedics;  Laterality: Right;  . Skin split graft Right 08/17/2015    Procedure: I&D RIGHT FOREARM/POSSIBLE SKIN GRAFT;  Surgeon: Roseanne Kaufman, MD;  Location: [place];  Service: Orthopedics;  Laterality: Right;   . Application of wound vac Right 08/17/2015    Procedure: APPLICATION OF WOUND VAC;  Surgeon: Roseanne Kaufman, MD;  Location: [place];  Service: Orthopedics;  Laterality: Right;  . I&d extremity Right 08/10/2015    Procedure: IRRIGATION AND DEBRIDEMENT SKIN, SUBCUTANEOS TISSUE AND  MUSCLE, CARPAL TUNNEL RELEASE, MEDIAN NERVE LYSIS WITH NERVE WRAP;  Surgeon: Roseanne Kaufman, MD;  Location: [place];  Service: Orthopedics;  Laterality: Right;   Family History:  Family History  Problem Relation Age of Onset  . Cancer Mother     lung  . Anxiety disorder Mother   . Cancer Father     throat  . Diabetes Paternal Grandmother   . Heart disease Neg Hx   . Stroke Neg Hx     Social History:  History  Alcohol Use  . Yes    Comment: occasional     History  Drug Use  . Yes  . Special: Marijuana, Cocaine    Comment: last use cocaine 08/08/15, last use MJ 08/08/15    Social History   Social History  . Marital Status: Single    Spouse Name: N/A  . Number of Children: N/A  . Years of Education: N/A   Social History Main Topics  . Smoking status: Current Every Day Smoker -- 0.50 packs/day for 6 years    Types: Cigarettes  . Smokeless tobacco: None  . Alcohol Use: Yes     Comment: occasional  . Drug Use: Yes    Special: Marijuana, Cocaine     Comment: last use cocaine 08/08/15, last use MJ 08/08/15  . Sexual Activity: Not Asked   Other Topics Concern  . 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   No children   Additional Social History:   Sleep: improved   Appetite:  Improving   Current Medications: Current Facility-Administered Medications  Medication Dose Route Frequency Provider Last Rate Last Dose  . acetaminophen (TYLENOL) tablet 650 mg  650 mg Oral Q6H PRN Niel Hummer, NP   650 mg at 08/31/15 1430  . alum & mag hydroxide-simeth (MAALOX/MYLANTA) 200-200-20 MG/5ML suspension 30 mL  30 mL Oral Q4H PRN  Niel Hummer, NP      . feeding supplement (ENSURE ENLIVE) (ENSURE ENLIVE) liquid 237 mL  237 mL Oral TID BM Nanci Pina, FNP   237 mL at 08/31/15 1310  . hydrOXYzine (ATARAX/VISTARIL) tablet 25 mg  25 mg Oral Q6H PRN Niel Hummer, NP   25 mg at 08/27/15 2116  . hydrOXYzine (ATARAX/VISTARIL) tablet 50 mg  50 mg Oral QHS PRN Jenne Campus, MD   50 mg at 08/30/15 2120  . magnesium hydroxide (MILK OF MAGNESIA) suspension 30 mL  30 mL Oral Daily PRN Niel Hummer, NP      . multivitamin with minerals tablet 1 tablet  1 tablet Oral Daily Nanci Pina, FNP   1 tablet at 08/31/15 0736  . oxyCODONE (Oxy IR/ROXICODONE) immediate release tablet 10 mg  10 mg Oral Q6H PRN Niel Hummer, NP   10 mg at 08/31/15 1309  . sertraline (ZOLOFT) tablet 75 mg  75 mg Oral Daily Jenne Campus, MD   75 mg at 08/31/15 0736    Lab Results:  Results for orders placed or performed during the hospital encounter of 08/24/15 (from the past 48 hour(s))  CBC with Differential/Platelet     Status: Abnormal   Collection Time: 08/30/15  6:26 PM  Result Value Ref Range   WBC 4.8 4.0 - 10.5 K/uL   RBC 4.10 (L) 4.22 - 5.81 MIL/uL   Hemoglobin 10.4 (L) 13.0 - 17.0 g/dL   HCT 32.4 (L) 39.0 - 52.0 %   MCV 79.0 78.0 - 100.0 fL   MCH 25.4 (L) 26.0 - 34.0 pg   MCHC 32.1 30.0 - 36.0 g/dL   RDW 14.4 11.5 - 15.5 %   Platelets 219 150 - 400 K/uL   Neutrophils Relative % 46 %   Neutro Abs 2.2 1.7 - 7.7 K/uL   Lymphocytes Relative 41 %   Lymphs Abs 2.0 0.7 - 4.0 K/uL   Monocytes Relative 11 %   Monocytes Absolute 0.5 0.1 - 1.0 K/uL   Eosinophils Relative 2 %   Eosinophils Absolute 0.1 0.0 - 0.7 K/uL   Basophils Relative 0 %   Basophils Absolute 0.0 0.0 - 0.1 K/uL    Comment: Performed at [facility]    Blood Alcohol level:  Lab Results  Component Value Date   [facility] <5 08/10/2015    Physical Findings: AIMS:  , ,  ,  ,    CIWA:    COWS:     Musculoskeletal: Strength & Muscle Tone: within  normal limits Gait & Station: normal Patient leans: N/A  Psychiatric Specialty Exam: Physical Exam  Nursing note and vitals reviewed. Constitutional: He is oriented to person, place, and time. He appears well-developed.  Neurological: He is alert and oriented to person, place, and time.  Psychiatric: He has a normal mood and affect. His behavior is normal.    Review of Systems  Psychiatric/Behavioral: Positive for depression. Negative for suicidal ideas. The patient is nervous/anxious.    denies chest pain, no shortness of breath, no drainage or bleeding from wound , describes ongoing pain when making stretching movements of hand  Blood pressure 146/100, pulse 66, temperature 98.7 F (37.1 C), temperature source Oral, resp. rate 18, height 6' 2"  (1.88 m), weight 182 lb (82.555 kg).Body mass index is 23.36 kg/(m^2).  General Appearance: improved grooming   Eye Contact:  Good  Speech:  Normal Rate  Volume:  Normal  Mood:  Gradually improving mood , less depressed   Affect:  Constricted but more  reactive   Thought Process:  Linear  Orientation:  Full (Time, Place, and Person)  Thought Content:  denies hallucinations, no delusions, ruminative   Suicidal Thoughts:  No- denies suicidal ideations at this time, denies self injurious ideations   Homicidal Thoughts:  No  Memory:  recent and remote grossly intact   Judgement:  Other:  improving   Insight:  improving  Psychomotor Activity:  Improving   Concentration:  Concentration: Good and Attention Span: Good  Recall:  Good  Fund of Knowledge:  Good  Language:  Good  Akathisia:  Negative  Handed:  Left  AIMS (if indicated):     Assets:  Desire for Improvement Resilience  ADL's:  Intact  Cognition:  WNL  Sleep:  Number of Hours: 6.5   Assessment - patient's mood is gradually improving , remains depressed, but presents with fuller range of affect, improved sleep, improved grooming  and eye contact, and generally states he feels more  optimistic and future oriented. Wound evaluated by wound RN ,felt to be progressing, healing adequately at this time . Denies suicidal ideations at this time , contracts for safety on unit. Tolerating Zoloft trial well thus far .  Treatment Plan Summary: Daily contact with patient to assess and evaluate symptoms and progress in treatment, Medication management, Plan inpatient treatment  and medications as below   Encourage increased milieu and group participation Increase Zoloft to  75  mgs  PO QDAY  for depression/anxiety. Continue Oxycodone PRNs for pain as needed, patient agrees to gradual taper as he continues to improve  Continue Vistaril 25 mgrs Q 6 hours PRN for anxiety, as needed. Continue  Vistaril 50 mg PO QHS for insomnia, as needed  Treatment team working on disposition options  Scheduled to go to [facility] for suture removal later today Neita Garnet, MD 08/31/2015, 4:53 PM

## 2015-08-31 NOTE — ED Notes (Signed)
Griffin Dakiniane Batchelor RN remains at bedside. [facility] Karie ChimeraBrian Buchanan PA at bedside Suture removal and dressing completed by PA Wound care reviewed [facility]

## 2015-08-31 NOTE — Progress Notes (Signed)
D: Patient visible in the milieu.  He is pleasant upon approach.  Requested that his dressing be changed with nurse's assistance.  Wound is slightly pink around edges; sutures intact.  No redness or signs of infection noted.  Some puralent drainage.  He denies any self harm thoughts.  He is scheduled to have sutures removed tomorrow.  He denies AVH/HI.  Patient feels he is not ready for discharge.  He requests that we check on his appointment for suture removal tomorrow.   A: Continue to monitor medication management and MD orders.  Safety checks completed every 15 minutes per protocol.  Offer support and encouragement as needed. R: Patient receptive to staff; his behavior is appropriate.

## 2015-08-31 NOTE — Tx Team (Signed)
Interdisciplinary Treatment Plan Update (Adult) Date: 08/31/2015   Date: 08/31/2015 9:00 AM  Progress in Treatment:  Attending groups: Yes Participating in groups: Yes Taking medication as prescribed: Yes  Tolerating medication: Yes  Family/Significant othe contact made: No, CSW assessing for appropriate contact Patient understands diagnosis: Yes AEB seeking help with depression Discussing patient identified problems/goals with staff: Yes  Medical problems stabilized or resolved: Yes  Denies suicidal/homicidal ideation: Yes Patient has not harmed self or Others: Yes   New problem(s) identified: None identified at this time.   Discharge Plan or Barriers: Patient is undecided about returning home with outpatient vs residential treatment at discharge. Family meeting with fiance on Wednesday to discuss discharge plans  Additional comments:  Patient and CSW reviewed pt's identified goals and treatment plan. Patient verbalized understanding and agreed to treatment plan. CSW reviewed [facility] "Discharge Process and Patient Involvement" Form. Pt verbalized understanding of information provided and signed form.   Reason for Continuation of Hospitalization:  Depression Medication stabilization Suicidal ideation  Estimated length of stay: 2-3 days  Review of initial/current patient goals per problem list:   1.  Goal(s): Patient will participate in aftercare plan  Met:  No  Target date: 3-5 days from date of admission   As evidenced by: Patient will participate within aftercare plan AEB aftercare provider and housing plan at discharge being identified.  08/25/15: CSW to work with Pt to assess for appropriate discharge plan and faciliate appointments and referrals as needed prior to d/c. 6/5: Goal progressing. Patient is undecided about returning home with outpatient vs residential treatment at discharge. Family meeting with fiance on Wednesday to discuss discharge plans  2.  Goal (s): Patient  will exhibit decreased depressive symptoms and suicidal ideations.  Met:  No  Target date: 3-5 days from date of admission   As evidenced by: Patient will utilize self rating of depression at 3 or below and demonstrate decreased signs of depression or be deemed stable for discharge by MD. 08/25/15: Pt was admitted with symptoms of depression, rating 10/10. Pt continues to present with flat affect and depressive symptoms.  Pt will demonstrate decreased symptoms of depression and rate depression at 3/10 or lower prior to discharge.    6/5: Goal progressing. Patient rates depression at 6-7, denies SI.   Attendees: Patient:    Family:    Physician: Dr. Parke Poisson; Dr. Shea Evans 08/31/2015 9:30 AM  Nursing: Marilynne Halsted, Grayland Ormond, Mayra Neer, RN 08/31/2015 9:30 AM  Clinical Social Worker: Tilden Fossa, LCSW 08/31/2015 9:30 AM  Other: Peri Maris, LCSWA; [place], LCSW  08/31/2015 9:30 AM  Other:  08/31/2015 9:30 AM  Other: Lars Pinks, Case Manager 08/31/2015 9:30 AM  Other: Agustina Caroli, May Augustin, NP 08/31/2015 9:30 AM  Other:           Tilden Fossa, LCSW Clinical Social Worker [facility] [phone]

## 2015-08-31 NOTE — Progress Notes (Signed)
D   Pt received back from the ED in stable condition    He requested pain medications and sleep/anxiety medication and became upset when he couldn't get them right away    [facility] so [facility]    Pt managed to calm himself and was appropriate while he waited for medications   Pt is requesting to be taken off of the narcotic pain medications A    Verbal support given   Medications administered and effectiveness monitored    Q 15 min checks    Informed pt he would probably need some detox from the medications and to speak with his doctor in the morning R   Pt safe at present and receptive to verbal support

## 2015-08-31 NOTE — ED Notes (Signed)
Liz BeachBrian B. [place] ortho PA notified pt is in triage room 9

## 2015-08-31 NOTE — Progress Notes (Signed)
Recreation Therapy Notes  Animal-Assisted Activity (AAA) Program Checklist/Progress Notes Patient Eligibility Criteria Checklist & Daily Group note for Rec Tx Intervention  Date: 06.06.2017 Time: 2:45pm Location: [address] Dayroom    AAA/T Program Assumption of Risk Form signed by Patient/ or Parent Legal Guardian Yes  Patient is free of allergies or sever asthma Yes  Patient reports no fear of animals Yes  Patient reports no history of cruelty to animals Yes  Patient understands his/her participation is voluntary Yes  Patient washes hands before animal contact Yes  Patient washes hands after animal contact Yes  Behavioral Response: Engaged, Attentive.   Education: Hand Washing, Appropriate Animal Interaction   Education Outcome: Acknowledges education.  Clinical Observations/Feedback: Patient interacted appropriately with therapy dog, petting him appropriately and asking appropriate questions about therapy dog and his training. Additionally patient interacted appropriately with peers during session.   Benjamin [name] L Benjamin [name], LRT/CTRS        Benjamin [name] L 08/31/2015 3:01 PM 

## 2015-08-31 NOTE — ED Notes (Signed)
[facility] [place].

## 2015-08-31 NOTE — Progress Notes (Signed)
D. Pt had been up and visible in milieu this evening, did attend and participate in evening group activity. Pt was seen by wound care nurse this evening and was able to care for his wound independently. Pt spoke some about how he is feeling a little better but still reports on-going depression and does look depressed in the milieu. Pt did receive all medications without incident this evening. A. Support and encouragement provided. R. Safety maintained, will continue to monitor.

## 2015-08-31 NOTE — Consult Note (Signed)
Reason for Consult:Suture removal Referring Physician: Behavioral Health  Benjamin [name] is an 50 y.o. male.  HPI: The patient is a pleasant 50 yo male who underwent multiple I& D's for open wound secondary to attempted suicide. The patient was ultimately closed primarily and had a return office visit at our office today for a wound check and suture removal. Currently, the patient is in inpatient behavioral health and we were contacted that he would not be able to be transported to our office. We have thus met the patient in the emergency room setting for wound check and suture removal. The patient has been doing well. He has no complaints today.  Past Medical History  Diagnosis Date  . Erectile dysfunction   . Chronic back pain     [facility]    Past Surgical History  Procedure Laterality Date  . Ankle surgery      right  . Hand surgery      fracture, ORIG, teenage years, right.  . Lumbar epidural injection      [facility]  . Tendon repair N/A 07/24/2015    Procedure: WRIST LACERATION AND TENDON REPAIR;  Surgeon: Iran Planas, MD;  Location: [place];  Service: Orthopedics;  Laterality: N/A;  . Wound exploration Right 08/15/2015    Procedure: WOUND EXPLORATION right forearm;  Surgeon: Justice Britain, MD;  [facility];  Service: Orthopedics;  Laterality: Right;  . Incision and drainage of wound Right 08/15/2015    Procedure: IRRIGATION AND DEBRIDEMENT WOUND;  Surgeon: Justice Britain, MD;  [facility];  Service: Orthopedics;  Laterality: Right;  . Skin split graft Right 08/17/2015    Procedure: I&D RIGHT FOREARM/POSSIBLE SKIN GRAFT;  Surgeon: Roseanne Kaufman, MD;  Location: [place];  Service: Orthopedics;  Laterality: Right;  . Application of wound vac Right 08/17/2015    Procedure: APPLICATION OF WOUND VAC;  Surgeon: Roseanne Kaufman, MD;  Location: [place];  Service: Orthopedics;  Laterality: Right;  . I&d extremity Right 08/10/2015    Procedure:  IRRIGATION AND DEBRIDEMENT SKIN, SUBCUTANEOS TISSUE AND  MUSCLE, CARPAL TUNNEL RELEASE, MEDIAN NERVE LYSIS WITH NERVE WRAP;  Surgeon: Roseanne Kaufman, MD;  Location: [place];  Service: Orthopedics;  Laterality: Right;    Family History  Problem Relation Age of Onset  . Cancer Mother     lung  . Anxiety disorder Mother   . Cancer Father     throat  . Diabetes Paternal Grandmother   . Heart disease Neg Hx   . Stroke Neg Hx     Social History:  reports that he has been smoking Cigarettes.  He has a 3 pack-year smoking history. He does not have any smokeless tobacco history on file. He reports that he drinks alcohol. He reports that he uses illicit drugs (Marijuana and Cocaine).  Allergies: No Known Allergies  Medications:  acetaminophen (TYLENOL) 325 MG tablet cephALEXin (KEFLEX) 500 MG capsule ciprofloxacin (CIPRO) 500 MG tablet docusate sodium (COLACE) 100 MG capsule docusate sodium (COLACE) 100 MG capsule DULoxetine (CYMBALTA) 30 MG capsule folic acid (FOLVITE) 1 MG tablet methocarbamol (ROBAXIN) 500 MG tablet Multiple Vitamin (MULTIVITAMIN WITH MINERALS) TABS tablet oxyCODONE-acetaminophen (PERCOCET) 10-325 MG tablet polyethylene glycol (MIRALAX / GLYCOLAX) packet thiamine 100 MG tablet traZODone (DESYREL) 50 MG tablet vitamin C (VITAMIN C) 1000 MG tablet  Results for orders placed or performed during the hospital encounter of 08/24/15 (from the past 48 hour(s))  CBC with Differential/Platelet     Status: Abnormal  Collection Time: 08/30/15  6:26 PM  Result Value Ref Range   WBC 4.8 4.0 - 10.5 K/uL   RBC 4.10 (L) 4.22 - 5.81 MIL/uL   Hemoglobin 10.4 (L) 13.0 - 17.0 g/dL   HCT 32.4 (L) 39.0 - 52.0 %   MCV 79.0 78.0 - 100.0 fL   MCH 25.4 (L) 26.0 - 34.0 pg   MCHC 32.1 30.0 - 36.0 g/dL   RDW 14.4 11.5 - 15.5 %   Platelets 219 150 - 400 K/uL   Neutrophils Relative % 46 %   Neutro Abs 2.2 1.7 - 7.7 K/uL   Lymphocytes Relative 41 %   Lymphs Abs 2.0 0.7 - 4.0 K/uL   Monocytes Relative  11 %   Monocytes Absolute 0.5 0.1 - 1.0 K/uL   Eosinophils Relative 2 %   Eosinophils Absolute 0.1 0.0 - 0.7 K/uL   Basophils Relative 0 %   Basophils Absolute 0.0 0.0 - 0.1 K/uL    Comment: Performed at [facility]    No results found.  Review of Systems  Constitutional: Negative.   HENT: Negative.   Eyes: Negative.   Musculoskeletal:       See HPI  Skin: Negative.   Psychiatric/Behavioral: Positive for depression.   Blood pressure 159/100, pulse 57, temperature 97.8 F (36.6 C), temperature source Oral, SpO2 100 %. Physical Exam  The patient is alert and oriented in no acute distress. The patient complains of pain in the affected upper extremity.  The patient is noted to have a normal HEENT exam. Lung fields show equal chest expansion and no shortness of breath. Abdomen exam is nontender without distention. Lower extremity examination does not show any fracture dislocation or blood clot symptoms. Pelvis is stable and the neck and back are stable and nontender. Right upper extremity: No signs of infection present, wounds without complicating features, compartments soft, 2 small areas with healthy granulation tissue present remaining wound stable, digital rom intact,  Assessment/Plan: S/p multiple I&D's with ultimate primary repair to right forearm laceration secondary to attempted suicide We have discussed with the patient and behavioral health nurse the need to wash the arm with soap and water daily/ok for showers and perform wet to dry dressing changes to the open granulation areas daily. Place wet 2x2 followed by 4x4 kling and ace wrap. We will need to see patient in approximately 2 weeks for a final check. All questions are encouraged and answered.   Makenize Messman L 08/31/2015, 6:50 PM

## 2015-08-31 NOTE — ED Notes (Addendum)
[facility].  [facility] see Brian B. [facility] PA to remove sutures.

## 2015-08-31 NOTE — Progress Notes (Signed)
[facility] right [facility].  Please call Karie ChimeraBrian Buchanan, PA with [facility] at [phone].  [facility].  [facility].  Called charge [facility] at [facility].  [facility].  Patient is voluntary and denies any self harm thoughts or behaviors.

## 2015-08-31 NOTE — [facility] Group Notes (Signed)
[facility] LCSW Group Therapy 08/31/2015 1:15 PM  Type of Therapy: Group Therapy- Feelings about Diagnosis  Participation Level: Active   Participation Quality:  Appropriate  Affect:  Appropriate  Cognitive: Alert and Oriented   Insight:  Developing   Engagement in Therapy: Developing/Improving and Engaged   Modes of Intervention: Clarification, Confrontation, Discussion, Education, Exploration, Limit-setting, Orientation, Problem-solving, Rapport Building, Dance movement psychotherapisteality Testing, Socialization and Support  Description of Group:   This group will allow patients to explore their thoughts and feelings about diagnoses they have received. Patients will be guided to explore their level of understanding and acceptance of these diagnoses. Facilitator will encourage patients to process their thoughts and feelings about the reactions of others to their diagnosis, and will guide patients in identifying ways to discuss their diagnosis with significant others in their lives. This group will be process-oriented, with patients participating in exploration of their own experiences as well as giving and receiving support and challenge from other group members.  Summary of Progress/Problems:  Jonny RuizJohn has fair insight of his current diagnosis. He stated that he was not diagnosed with a mental illness but is actively engaging to learn more about his recent mood disorder and suicide attempt. He reports of challenges within his family understanding his current mood and lack of knowledge when it comes to mental illness. Jonny RuizJohn is open to exploring resources in the community for treatment of depression and grief.   Therapeutic Modalities:   Cognitive Behavioral Therapy Solution Focused Therapy Motivational Interviewing Relapse Prevention Therapy  Chad CordialLauren [facility], LCSWA 08/31/2015 2:58 PM

## 2015-08-31 NOTE — ED Notes (Signed)
Lillia AbedLindsay [facility] and [facility] made aware re chart problem.

## 2015-08-31 NOTE — ED Notes (Addendum)
Pt triaged and VS obtained by Ilene QuaAnmarie G. RN in Triage 9 Fast Track.  Diane Leonard SchwartzB. RN from [facility] sitting with pt.

## 2015-09-01 MED ORDER — OXYCODONE HCL 5 MG PO TABS
5.0000 mg | ORAL_TABLET | Freq: Four times a day (QID) | ORAL | Status: DC | PRN
  Administered 2015-09-01 – 2015-09-02 (×2): 5 mg via ORAL
  Filled 2015-09-01 (×3): qty 1

## 2015-09-01 MED ORDER — SERTRALINE HCL 100 MG PO TABS
100.0000 mg | ORAL_TABLET | Freq: Every day | ORAL | Status: DC
  Administered 2015-09-02 – 2015-09-04 (×3): 100 mg via ORAL
  Filled 2015-09-01 (×5): qty 1

## 2015-09-01 NOTE — [facility] Group Notes (Signed)
[facility] LCSW Group Therapy Note  Date/Time: 09/01/2015   1:30PM  Type of Therapy and Topic:  Group Therapy:  Holding on to Grudges  Participation Level: Active     Description of Group:    In this group patients will be asked to explore and define a grudge.  Patients will be guided to discuss their thoughts, feelings, and behaviors as to why one holds on to grudges and reasons why people have grudges. Patients will process the impact grudges have on daily life and identify thoughts and feelings related to holding on to grudges. Facilitator will challenge patients to identify ways of letting go of grudges and the benefits once released.  Patients will be confronted to address why one struggles letting go of grudges. Lastly, patients will identify feelings and thoughts related to what life would look like without grudges.  This group will be process-oriented, with patients participating in exploration of their own experiences as well as giving and receiving support and challenge from other group members.  Therapeutic Goals: 1. Patient will identify specific grudges related to their personal life. 2. Patient will identify feelings, thoughts, and beliefs around grudges. 3. Patient will identify how one releases grudges appropriately. 4. Patient will identify situations where they could have let go of the grudge, but instead chose to hold on.  Summary of Patient Progress  Patient discussed holding grudges against himself and wants to work on letting them go. CSW and other group members provided patient with emotional support and encouragement.     Therapeutic Modalities:   Cognitive Behavioral Therapy Solution Focused Therapy Motivational Interviewing Brief Therapy    Samuella BruinKristin Cayley Pester, LCSW Clinical Social Worker [facility] [phone]

## 2015-09-01 NOTE — [facility] Group Notes (Signed)
   [facility] LCSW [facility] Discharge Planning Group Note  09/01/2015  8:45 AM   Participation Quality: Alert, Appropriate and Oriented  Mood/Affect: Blunted  Depression Rating: 4  Anxiety Rating: mild  Thoughts of Suicide: Pt denies SI/HI  Will you contract for safety? Yes  Current AVH: Pt denies  Plan for Discharge/Comments: Pt attended discharge planning group and actively participated in group. CSW provided pt with today's workbook. Patient is considering residential vs outpatient treatment. Family meeting with fiance planned for today.   Transportation Means: Pt reports access to transportation  Supports: No supports mentioned at this time  Samuella BruinKristin Jessilyn Catino, MSW, [employer] JohnsonLCSW Clinical Social Worker [facility] [phone]

## 2015-09-01 NOTE — Progress Notes (Signed)
Recreation Therapy Notes  Date: 06.07.2017 Time: 9:30am Location: [address] Hall Group Room   Group Topic: Stress Management  Goal Area(s) Addresses:  Patient will actively participate in stress management techniques presented during session.   Behavioral Response:Did not attend.   Marykay Lexenise L Birdie Fetty, LRT/CTRS         Cornelious Bartolucci L 09/01/2015 11:48 AM

## 2015-09-01 NOTE — Progress Notes (Signed)
Patient ID: Benjamin [name], male   DOB: Dec 21, 1965, 50 y.o.   MRN: [id number] [facility] MD Progress Note  09/01/2015 5:46 PM Benjamin [name]  MRN:  [id number]  Subjective: patient reports partial improvement in mood, but states he still struggles with depression, and in particular with apprehension about how he will do once discharged. He reports his wound pain has decreased after sutures were removed, and in general is less focused on wound at this time. He does report he is feeling more optimistic, and feels current medications are working well for him- denies side effects.  Objective : I have discussed case with treatment team and have met with patient .  Patient presents improved compared to initial presentation, less depressed, but still vaguely constricted and anxious . Today we had family session with patient, his GF, Probation officer. She reiterated he seems to be improving, and her support for him and provided information as below. She expressed patient has a long history of anxiety. Patient denies suicidal ideations and contracts for safety on unit, and behavior on unit is calm and in good control. Of note, patient reports significant anxiety about discharging, and states he ruminates about returning to his mother's home, which is " like a dark place for me to go" - referring to her death, and suicide attempt. States he plans to move in with GF , but that she is going to be out of state until later this month. Sutures were removed recently - pain has decreased , no active bleeding . Patient calm, pleasant on approach.  Denies medication side effects, he feels medications are helping.   Principal Problem: MDD (major depressive disorder), recurrent episode, severe ([place]) Diagnosis:   Patient Active Problem List   Diagnosis Date Noted  . Severe episode of recurrent major depressive disorder, without psychotic features ([place]) [F33.2]   . MDD (major depressive disorder), recurrent episode, severe ([place]) [F33.2]  08/24/2015  . Traumatic hematoma of forearm [S50.10XA] 08/16/2015  . Traumatic hematoma of right forearm [S50.11XA] 08/15/2015  . Insomnia [G47.00]   . MDD (major depressive disorder), single episode, severe , no psychosis ([place]) [F32.2] 08/14/2015  . Cocaine use disorder, mild, abuse [F14.10] 08/14/2015  . Cannabis use disorder, mild, abuse [F12.10] 08/14/2015  . Opioid use disorder, mild, abuse [F11.10] 08/14/2015  . Suicide attempt ([place]) [T14.91] 08/10/2015  . Laceration of right arm with complication [E95.284X] 32/44/0102  . Uncontrolled hypertension [I10] 08/10/2015  . Dehydration [E86.0] 08/10/2015  . Weight loss, non-intentional [R63.4] 08/10/2015  . Depression [F32.9] 08/10/2015  . Essential hypertension [I10]   . Vaccine refused by patient [Z28.20] 05/24/2015  . Routine general medical examination at a health care facility [Z00.00] 05/24/2015  . Chronic back pain [M54.9, G89.29] 05/24/2015  . Abnormal serum creatinine level [R79.9] 05/24/2015  . Family history of cancer [Z80.9] 05/24/2015  . Screening for prostate cancer [Z12.5] 05/24/2015  . Smoker [Z72.0] 05/24/2015  . Cough [R05] 05/24/2015  . Screen for STD (sexually transmitted disease) [Z11.3] 05/24/2015  . Erectile dysfunction [N52.9] 01/31/2014   Total Time spent with patient: 35 minutes - more than 50% of time spent on disposition planning and therapy    Past Medical History:  Past Medical History  Diagnosis Date  . Erectile dysfunction   . Chronic back pain     managed [facility]    Past Surgical History  Procedure Laterality Date  . Ankle surgery      right  . Hand surgery      fracture,  ORIG, teenage years, right.  . Lumbar epidural injection      [facility]  . Tendon repair N/A 07/24/2015    Procedure: WRIST LACERATION AND TENDON REPAIR;  Surgeon: Iran Planas, MD;  Location: [facility];  Service: Orthopedics;  Laterality: N/A;  . Wound exploration Right 08/15/2015     Procedure: WOUND EXPLORATION right forearm;  Surgeon: Justice Britain, MD;  Location: [facility];  Service: Orthopedics;  Laterality: Right;  . Incision and drainage of wound Right 08/15/2015    Procedure: IRRIGATION AND DEBRIDEMENT WOUND;  Surgeon: Justice Britain, MD;  Location: [facility];  Service: Orthopedics;  Laterality: Right;  . Skin split graft Right 08/17/2015    Procedure: I&D RIGHT FOREARM/POSSIBLE SKIN GRAFT;  Surgeon: Roseanne Kaufman, MD;  Location: [place];  Service: Orthopedics;  Laterality: Right;  . Application of wound vac Right 08/17/2015    Procedure: APPLICATION OF WOUND VAC;  Surgeon: Roseanne Kaufman, MD;  Location: [place];  Service: Orthopedics;  Laterality: Right;  . I&d extremity Right 08/10/2015    Procedure: IRRIGATION AND DEBRIDEMENT SKIN, SUBCUTANEOS TISSUE AND  MUSCLE, CARPAL TUNNEL RELEASE, MEDIAN NERVE LYSIS WITH NERVE WRAP;  Surgeon: Roseanne Kaufman, MD;  Location: [place];  Service: Orthopedics;  Laterality: Right;   Family History:  Family History  Problem Relation Age of Onset  . Cancer Mother     lung  . Anxiety disorder Mother   . Cancer Father     throat  . Diabetes Paternal Grandmother   . Heart disease Neg Hx   . Stroke Neg Hx     Social History:  History  Alcohol Use  . Yes    Comment: occasional     History  Drug Use  . Yes  . Special: Marijuana, Cocaine    Comment: last use cocaine 08/08/15, last use MJ 08/08/15    Social History   Social History  . Marital Status: Single    Spouse Name: N/A  . Number of Children: N/A  . Years of Education: N/A   Social History Main Topics  . Smoking status: Current Every Day Smoker -- 0.50 packs/day for 6 years    Types: Cigarettes  . Smokeless tobacco: None  . Alcohol Use: Yes     Comment: occasional  . Drug Use: Yes    Special: Marijuana, Cocaine     Comment: last use cocaine 08/08/15, last use MJ 08/08/15  . Sexual Activity: Not Asked   Other Topics Concern  . 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   No children   Additional Social History:   Sleep: improved   Appetite:  Improving   Current Medications: Current Facility-Administered Medications  Medication Dose Route Frequency Provider Last Rate Last Dose  . acetaminophen (TYLENOL) tablet 650 mg  650 mg Oral Q6H PRN Jenne Campus, MD   650 mg at 09/01/15 1716  . alum & mag hydroxide-simeth (MAALOX/MYLANTA) 200-200-20 MG/5ML suspension 30 mL  30 mL Oral Q6H PRN Myer Peer Cobos, MD      . feeding supplement (ENSURE ENLIVE) (ENSURE ENLIVE) liquid 237 mL  237 mL Oral BID BM Myer Peer Cobos, MD   237 mL at 09/01/15 1400  . hydrOXYzine (ATARAX/VISTARIL) tablet 25 mg  25 mg Oral Q6H PRN Jenne Campus, MD      . hydrOXYzine (ATARAX/VISTARIL) tablet 50 mg  50 mg Oral QHS PRN Jenne Campus, MD  50 mg at 08/31/15 2050  . magnesium hydroxide (MILK OF MAGNESIA) suspension 30 mL  30 mL Oral Daily PRN Jenne Campus, MD      . multivitamin with minerals tablet 1 tablet  1 tablet Oral Daily Jenne Campus, MD   1 tablet at 09/01/15 [phone]  . oxyCODONE (Oxy IR/ROXICODONE) immediate release tablet 5 mg  5 mg Oral Q6H PRN Jenne Campus, MD      . sertraline (ZOLOFT) tablet 75 mg  75 mg Oral Daily Jenne Campus, MD   75 mg at 09/01/15 6222    Lab Results:  Results for orders placed or performed during the hospital encounter of 08/24/15 (from the past 48 hour(s))  CBC with Differential/Platelet     Status: Abnormal   Collection Time: 08/30/15  6:26 PM  Result Value Ref Range   WBC 4.8 4.0 - 10.5 K/uL   RBC 4.10 (L) 4.22 - 5.81 MIL/uL   Hemoglobin 10.4 (L) 13.0 - 17.0 g/dL   HCT 32.4 (L) 39.0 - 52.0 %   MCV 79.0 78.0 - 100.0 fL   MCH 25.4 (L) 26.0 - 34.0 pg   MCHC 32.1 30.0 - 36.0 g/dL   RDW 14.4 11.5 - 15.5 %   Platelets 219 150 - 400 K/uL   Neutrophils Relative % 46 %   Neutro Abs 2.2 1.7 - 7.7 K/uL   Lymphocytes Relative 41 %   Lymphs Abs 2.0 0.7 -  4.0 K/uL   Monocytes Relative 11 %   Monocytes Absolute 0.5 0.1 - 1.0 K/uL   Eosinophils Relative 2 %   Eosinophils Absolute 0.1 0.0 - 0.7 K/uL   Basophils Relative 0 %   Basophils Absolute 0.0 0.0 - 0.1 K/uL    Comment: Performed at [facility]    Blood Alcohol level:  Lab Results  Component Value Date   [facility] <5 08/10/2015    Physical Findings: AIMS:  , ,  ,  ,    CIWA:    COWS:     Musculoskeletal: Strength & Muscle Tone: within normal limits Gait & Station: normal Patient leans: N/A  Psychiatric Specialty Exam: Physical Exam  Nursing note and vitals reviewed. Constitutional: He is oriented to person, place, and time. He appears well-developed.  Neurological: He is alert and oriented to person, place, and time.  Psychiatric: He has a normal mood and affect. His behavior is normal.    Review of Systems  Psychiatric/Behavioral: Positive for depression. Negative for suicidal ideas. The patient is nervous/anxious.    denies chest pain, no shortness of breath, no drainage or bleeding from wound , describes improving wound  pain   Blood pressure 136/89, pulse 62, temperature 98 F (36.7 C), temperature source Oral, resp. rate 18, height _0  (1.88 m), weight 182 lb (82.555 kg), SpO2 100 %.Body mass index is 23.36 kg/(m^2).  General Appearance: improved grooming   Eye Contact:  Good  Speech:  Normal Rate  Volume:  Normal  Mood:  Still depressed, but generally improved compared to admission  Affect:   more  reactive - remains anxious   Thought Process:  Linear  Orientation:  Full (Time, Place, and Person)  Thought Content:  denies hallucinations, no delusions, ruminative   Suicidal Thoughts:  No- denies suicidal ideations at this time, denies self injurious ideations   Homicidal Thoughts:  No  Memory:  recent and remote grossly intact   Judgement:  Other:  improving   Insight:  improving  Psychomotor Activity:  Improving   Concentration:   Concentration: Good and Attention Span: Good  Recall:  Good  Fund of Knowledge:  Good  Language:  Good  Akathisia:  Negative  Handed:  Left  AIMS (if indicated):     Assets:  Desire for Improvement Resilience  ADL's:  Intact  Cognition:  WNL  Sleep:  Number of Hours: 6   Assessment - patient presents with improving mood and range of affect, but remains vaguely depressed and anxious, particularly about discharge. He reports sense of apprehension about returning to his mother's home ( mother passed away earlier this year) and states that that is where his depression worsened , he plans to move in with GF but report is that she is going to be out of town for a period of time. At this time denies SI, and is presenting with improved range of affect. No SI on unit, able to contract for safety   Treatment Plan Summary: Daily contact with patient to assess and evaluate symptoms and progress in treatment, Medication management, Plan inpatient treatment  and medications as below   Encourage increased milieu and group participation Increase Zoloft to  100   mgs  PO QDAY  for depression/anxiety. Decrease  Oxycodone  To 5 mgrs   6 hours PRNs for pain as needed,as pain level improving  Continue Vistaril 25 mgrs Q 6 hours PRN for anxiety, as needed. Continue  Vistaril 50 mg PO QHS for insomnia, as needed  Treatment team working on disposition options   Neita Garnet, MD 09/01/2015, 5:46 PM

## 2015-09-01 NOTE — Progress Notes (Signed)
D: Pt presents with flat affect and depressed mood. Pt rated depression 7/10. Hopeless 5/10. Anxiety 3/10. Pt denies suicidal thoughts. Pt reported on self inventory sheet SI. Pt verbally contracts with Clinical research associatewriter for safety. Pt reports poor sleep. Pt denies nightmares or racing thoughts at bedtime. Pt reported stressor "life". Pt reported that he's going to ask the MD if he can come off the pain medicine. Pt requested prn Oxy this morning for pain.  A: Medications reviewed with pt. Medications administered as ordered per MD. Verbal support provided. Pt encouraged to attend groups. 15 minute checks performed for safety. R: Pt receptive to tx. Pt verbalized understanding of med regimen.

## 2015-09-02 MED ORDER — OXYCODONE HCL 5 MG PO TABS
5.0000 mg | ORAL_TABLET | Freq: Three times a day (TID) | ORAL | Status: DC | PRN
  Administered 2015-09-02 – 2015-09-06 (×8): 5 mg via ORAL
  Filled 2015-09-02 (×8): qty 1

## 2015-09-02 NOTE — Progress Notes (Signed)
Adult Psychoeducational Group Note  Date:  09/02/2015 Time:  2:54 AM  Group Topic/Focus:  Wrap-Up Group:   The focus of this group is to help patients review their daily goal of treatment and discuss progress on daily workbooks.  Participation Level:  Active  Participation Quality:  Appropriate  Affect:  Appropriate  Cognitive:  Alert  Insight: Appropriate  Engagement in Group:  Engaged  Modes of Intervention:  Discussion  Additional Comments:  Patient states having a good day. Patient goal for today was to socialize with peers.  Damica Gravlin L Leary Mcnulty 09/02/2015, 2:54 AM

## 2015-09-02 NOTE — Progress Notes (Signed)
D   Pt is cooperative and pleasant on approach   He continues to endorse some depression and sadness   He requested supplies to do his dressing change   He interacts minimally but appropriately with staff and peers  A   Verbal support given   Medications administered and effectiveness monitored   Q 15 min checks R   Pt safe at present

## 2015-09-02 NOTE — Progress Notes (Signed)
Adult Psychoeducational Group Note  Date:  09/02/2015 Time:  1015  Group Topic/Focus:  Self Esteem Action Plan:   The focus of this group is to help patients create a plan to continue to build self-esteem after discharge.  Participation Level:  Active  Participation Quality:  Appropriate and Attentive  Affect:  Appropriate  Cognitive:  Alert and Appropriate  Insight: Appropriate and Good  Engagement in Group:  Engaged and Improving  Modes of Intervention:  Discussion, Education and Rapport Building  Additional Comments:  Pt attended and participated in group discussion. Pt was able to list 5 ways his taken care of himself lately and all were positive   Gwenevere Ghazili, Sachit Gilman Patience 09/02/2015, 11:23 AM

## 2015-09-02 NOTE — [facility] Group Notes (Signed)
[facility] LCSW Group Therapy  09/02/2015 3:35 PM  Type of Therapy:  Group Therapy  Participation Level:  Active  Participation Quality:  Appropriate and Attentive  Affect:  Appropriate  Cognitive:  Alert and Appropriate  Insight:  Developing/Improving  Engagement in Therapy:  Developing/Improving  Modes of Intervention:  Discussion, Education, Exploration and Support  Summary of Progress/Problems:  [facility] Speaker came to talk about his personal journey with substance abuse and addiction. The pt processed ways by which to relate to the speaker. [facility] speaker provided handouts and educational information pertaining to groups and services offered by the [facility].   [place], Anne C 09/02/2015, 3:35 PM   

## 2015-09-02 NOTE — Progress Notes (Signed)
Patient ID: Benjamin [name], male   DOB: 12/26/1965, 50 y.o.   MRN: [id number] [facility] MD Progress Note  09/02/2015 2:45 PM Benjamin [name]  MRN:  [id number]  Subjective: although reporting improvement, continues to report a sense of apprehension, anxiety, related to returning to the community after long hospitalization. Also reports feeling ashamed and embarrassed about his suicidal attempt/ resulting wound . He is, however, clearly more future oriented, and spoke about possibly travelling up to [facility] after discharge in order to spend some time with his GF , who will be there the next two weeks, before returning and establishing themselves in their new home. He does report a sense of reluctance and apprehension about going  Back to his mother's house /living alone . Of note, denies any suicidal ideations or self injurious ideations at this time. Forearm wound is currently healing well - minimal sero-sanguinous drainage, pain has subsided and range of hand motion has been improving . Denies medication side effects.  Objective : I have discussed case with treatment team and have met with patient .  Patient presents improved compared to initial presentation, less depressed, but still vaguely constricted and anxious . Patient more future oriented, reports he is feeling better, and at this time denies severe neuro-vegetative symptoms of depression or suicidal ideations, but continues to ruminate about discharge planning, in particular expresses concern of discharging when his GF is not in [place], as he states she is his closest support, but today states he is thinking of travelling to [facility] for a week or two to stay with her there until they can both return to [place] area together. As above, wound continues to heal well, following removal of sutures recently. No fever, no chills, denies any signs or symptoms of infection. Visible on unit, going to groups, pleasant on approach. Denies medication side effects,  he feels medications are helping. Responsive  to support, encouragement, review of coping skills and ego strengths affect tends to improve as session progresses    Principal Problem: MDD (major depressive disorder), recurrent episode, severe ([place]) Diagnosis:   Patient Active Problem List   Diagnosis Date Noted  . Severe episode of recurrent major depressive disorder, without psychotic features ([place]) [F33.2]   . MDD (major depressive disorder), recurrent episode, severe ([place]) [F33.2] 08/24/2015  . Traumatic hematoma of forearm [S50.10XA] 08/16/2015  . Traumatic hematoma of right forearm [S50.11XA] 08/15/2015  . Insomnia [G47.00]   . MDD (major depressive disorder), single episode, severe , no psychosis ([place]) [F32.2] 08/14/2015  . Cocaine use disorder, mild, abuse [F14.10] 08/14/2015  . Cannabis use disorder, mild, abuse [F12.10] 08/14/2015  . Opioid use disorder, mild, abuse [F11.10] 08/14/2015  . Suicide attempt ([place]) [T14.91] 08/10/2015  . Laceration of right arm with complication [M57.846N] 62/95/2841  . Uncontrolled hypertension [I10] 08/10/2015  . Dehydration [E86.0] 08/10/2015  . Weight loss, non-intentional [R63.4] 08/10/2015  . Depression [F32.9] 08/10/2015  . Essential hypertension [I10]   . Vaccine refused by patient [Z28.20] 05/24/2015  . Routine general medical examination at a health care facility [Z00.00] 05/24/2015  . Chronic back pain [M54.9, G89.29] 05/24/2015  . Abnormal serum creatinine level [R79.9] 05/24/2015  . Family history of cancer [Z80.9] 05/24/2015  . Screening for prostate cancer [Z12.5] 05/24/2015  . Smoker [Z72.0] 05/24/2015  . Cough [R05] 05/24/2015  . Screen for STD (sexually transmitted disease) [Z11.3] 05/24/2015  . Erectile dysfunction [N52.9] 01/31/2014   Total Time spent with patient: 20 minutes     Past Medical History:  Past Medical  History  Diagnosis Date  . Erectile dysfunction   . Chronic back pain     [facility]    Past Surgical History  Procedure Laterality Date  . Ankle surgery      right  . Hand surgery      fracture, ORIG, teenage years, right.  . Lumbar epidural injection      [facility]  . Tendon repair N/A 07/24/2015    Procedure: WRIST LACERATION AND TENDON REPAIR;  Surgeon: Iran Planas, MD;  Location: [place];  Service: Orthopedics;  Laterality: N/A;  . Wound exploration Right 08/15/2015    Procedure: WOUND EXPLORATION right forearm;  Surgeon: Justice Britain, MD;  Location: [facility];  Service: Orthopedics;  Laterality: Right;  . Incision and drainage of wound Right 08/15/2015    Procedure: IRRIGATION AND DEBRIDEMENT WOUND;  Surgeon: Justice Britain, MD;  Location: [facility];  Service: Orthopedics;  Laterality: Right;  . Skin split graft Right 08/17/2015    Procedure: I&D RIGHT FOREARM/POSSIBLE SKIN GRAFT;  Surgeon: Roseanne Kaufman, MD;  Location: [place];  Service: Orthopedics;  Laterality: Right;  . Application of wound vac Right 08/17/2015    Procedure: APPLICATION OF WOUND VAC;  Surgeon: Roseanne Kaufman, MD;  Location: [place];  Service: Orthopedics;  Laterality: Right;  . I&d extremity Right 08/10/2015    Procedure: IRRIGATION AND DEBRIDEMENT SKIN, SUBCUTANEOS TISSUE AND  MUSCLE, CARPAL TUNNEL RELEASE, MEDIAN NERVE LYSIS WITH NERVE WRAP;  Surgeon: Roseanne Kaufman, MD;  Location: [place];  Service: Orthopedics;  Laterality: Right;   Family History:  Family History  Problem Relation Age of Onset  . Cancer Mother     lung  . Anxiety disorder Mother   . Cancer Father     throat  . Diabetes Paternal Grandmother   . Heart disease Neg Hx   . Stroke Neg Hx     Social History:  History  Alcohol Use  . Yes    Comment: occasional     History  Drug Use  . Yes  . Special: Marijuana, Cocaine    Comment: last use cocaine 08/08/15, last use MJ 08/08/15    Social History   Social History  . Marital Status: Single    Spouse Name: N/A  . Number of Children: N/A  . Years  of Education: N/A   Social History Main Topics  . Smoking status: Current Every Day Smoker -- 0.50 packs/day for 6 years    Types: Cigarettes  . Smokeless tobacco: None  . Alcohol Use: Yes     Comment: occasional  . Drug Use: Yes    Special: Marijuana, Cocaine     Comment: last use cocaine 08/08/15, last use MJ 08/08/15  . Sexual Activity: Not Asked   Other Topics Concern  . 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   No children   Additional Social History:   Sleep: improved   Appetite:  Improving   Current Medications: Current Facility-Administered Medications  Medication Dose Route Frequency Provider Last Rate Last Dose  . acetaminophen (TYLENOL) tablet 650 mg  650 mg Oral Q6H PRN Jenne Campus, MD   650 mg at 09/01/15 1716  . alum & mag hydroxide-simeth (MAALOX/MYLANTA) 200-200-20 MG/5ML suspension 30 mL  30 mL Oral Q6H PRN Myer Peer Cobos, MD      . feeding supplement (ENSURE ENLIVE) (ENSURE ENLIVE) liquid 237 mL  237  mL Oral BID BM Myer Peer Cobos, MD   237 mL at 09/02/15 0825  . hydrOXYzine (ATARAX/VISTARIL) tablet 25 mg  25 mg Oral Q6H PRN Jenne Campus, MD      . hydrOXYzine (ATARAX/VISTARIL) tablet 50 mg  50 mg Oral QHS PRN Jenne Campus, MD   50 mg at 09/01/15 2114  . magnesium hydroxide (MILK OF MAGNESIA) suspension 30 mL  30 mL Oral Daily PRN Jenne Campus, MD      . multivitamin with minerals tablet 1 tablet  1 tablet Oral Daily Jenne Campus, MD   1 tablet at 09/02/15 0825  . oxyCODONE (Oxy IR/ROXICODONE) immediate release tablet 5 mg  5 mg Oral Q6H PRN Jenne Campus, MD   5 mg at 09/02/15 0825  . sertraline (ZOLOFT) tablet 100 mg  100 mg Oral Daily Jenne Campus, MD   100 mg at 09/02/15 0825    Lab Results:  No results found for this or any previous visit (from the past 22 hour(s)).  Blood Alcohol level:  Lab Results  Component Value Date   ETH <5 08/10/2015     Physical Findings: AIMS:  , ,  ,  ,    CIWA:    COWS:     Musculoskeletal: Strength & Muscle Tone: within normal limits Gait & Station: normal Patient leans: N/A  Psychiatric Specialty Exam: Physical Exam  Nursing note and vitals reviewed. Constitutional: He is oriented to person, place, and time. He appears well-developed.  Neurological: He is alert and oriented to person, place, and time.  Psychiatric: He has a normal mood and affect. His behavior is normal.    Review of Systems  Psychiatric/Behavioral: Positive for depression. Negative for suicidal ideas. The patient is nervous/anxious.    denies chest pain, no shortness of breath, no drainage or bleeding from wound , describes improving wound  pain   Blood pressure 151/89, pulse 67, temperature 98.3 F (36.8 C), temperature source Oral, resp. rate 18, height 6' 2"  (1.88 m), weight 182 lb (82.555 kg), SpO2 100 %.Body mass index is 23.36 kg/(m^2).  General Appearance: improved grooming   Eye Contact:  Good  Speech:  Normal Rate  Volume:  Normal  Mood:  Gradually improving mood, more reactive affect but still anxious   Affect: remains anxious   Thought Process:  Linear  Orientation:  Full (Time, Place, and Person)  Thought Content:  denies hallucinations, no delusions, ruminative   Suicidal Thoughts:  No- denies suicidal ideations at this time, denies self injurious ideations   Homicidal Thoughts:  No  Memory:  recent and remote grossly intact   Judgement:  Other:  improving   Insight:  improving  Psychomotor Activity:  Improving   Concentration:  Concentration: Good and Attention Span: Good  Recall:  Good  Fund of Knowledge:  Good  Language:  Good  Akathisia:  Negative  Handed:  Left  AIMS (if indicated):     Assets:  Desire for Improvement Resilience  ADL's:  Intact  Cognition:  WNL  Sleep:  Number of Hours: 6   Assessment - mood and affect continue to improve, and denies any suicidal ideations at this time.  Remains  Anxious, apprehensive, particularly as he works on disposition planning, but seems more future oriented, as above, planning to travel to [facility] for a brief period fo time after discharge in order to spend time with GF. Denies medication side effects, has tolerated Zoloft titration well . Forearm, hand pain have  decreased.   Treatment Plan Summary: Daily contact with patient to assess and evaluate symptoms and progress in treatment, Medication management, Plan inpatient treatment  and medications as below   Encourage increased milieu and group participation Continue Zoloft   100   mgs  PO QDAY  for depression/anxiety. Decrease  Oxycodone to 5 mgrs   8 hours PRNs for pain as needed,as pain level improving  Continue Vistaril 25 mgrs Q 6 hours PRN for anxiety, as needed. Continue  Vistaril 50 mg PO QHS for insomnia, as needed  Treatment team working on disposition options   Neita Garnet, MD 09/02/2015, 2:45 PM

## 2015-09-02 NOTE — Progress Notes (Signed)
D:Pt presents with flat affect and depressed mood. Pt rates depression 8/10. Anxiety 5/10. Hopeless 5/10. Pt denies suicidal thoughts and verbally contracts for safety. Pt reports poor sleep last night due to over thinking personal issues. Pt verbalized that he's not ready for discharge but is ready to start working on discharge planning. Pt stated that there's things he still needs to work out.  A: Medications reviewed with pt. Medications administered as ordered per MD. Verbal support provided. Pt encouraged to attend groups. 15 minute checks performed for safety.  R: Pt stated goal "work on self".

## 2015-09-02 NOTE — Progress Notes (Signed)
Patient did attend the evening karaoke group. Pt was attentive and supportive but did not participate by singing a song.   

## 2015-09-02 NOTE — Progress Notes (Signed)
Adult Psychoeducational Group Note  Date:  09/02/2015 Time:  0900  Group Topic/Focus:  Crisis Planning:   The purpose of this group is to help patients create a crisis plan for use upon discharge or in the future, as needed.  Participation Level:  Active  Participation Quality:  Appropriate  Affect:  Appropriate  Cognitive:  Appropriate  Insight: Appropriate  Engagement in Group:  Engaged  Modes of Intervention:  Discussion  Additional Comments:  "IOP or groups and living with fiance".   Abhijay Morriss L 09/02/2015, 11:38 AM

## 2015-09-03 NOTE — Progress Notes (Signed)
D: Pt denies SI/HI/AVH. Pt is pleasant and cooperative. Pt goal for today is to work on discharge planning and take care of self. A: Pt was offered support and encouragement. Pt was given scheduled medications. Pt was encourage to attend groups. Q 15 minute checks were done for safety.  R:Pt attends groups and interacts well with peers and staff. Pt is taking medication. Pt has no complaints.Pt receptive to treatment and safety maintained on unit.

## 2015-09-03 NOTE — Progress Notes (Signed)
Recreation Therapy Notes  Date: 06.09.2017 Time: 9:30am Location: [address] Dayroom   Group Topic: Stress Management  Goal Area(s) Addresses:  Patient will actively participate in stress management techniques presented during session.   Behavioral Response: Did not attend.   Gabryela Kimbrell L Persephone Schriever, LRT/CTRS         Miyanna Wiersma L 09/03/2015 2:13 PM 

## 2015-09-03 NOTE — [facility] Group Notes (Signed)
[facility] LCSW Group Therapy 09/03/2015 1:15pm  Type of Therapy: Group Therapy- Feelings Around Relapse and Recovery  Participation Level: Minimal  Participation Quality:  Attentive  Affect:  Appropriate  Cognitive: Alert and Oriented   Insight:  Developing   Engagement in Therapy: Developing/Improving and Engaged   Modes of Intervention: Clarification, Confrontation, Discussion, Education, Exploration, Limit-setting, Orientation, Problem-solving, Rapport Building, Dance movement psychotherapisteality Testing, Socialization and Support  Summary of Progress/Problems: The topic for today was feelings about relapse. The group discussed what relapse prevention is to them and identified triggers that they are on the path to relapse. Members also processed their feeling towards relapse and were able to relate to common experiences. Group also discussed coping skills that can be used for relapse prevention. Pt participated minimally on a verbal level but was attentive to discussion throughout session.     Therapeutic Modalities:   Cognitive Behavioral Therapy Solution-Focused Therapy Assertiveness Training Relapse Prevention Therapy    Lamar SprinklesLauren Carter, LCSWA [phone] 09/03/2015 3:40 PM

## 2015-09-03 NOTE — Progress Notes (Signed)
Patient ID: Benjamin HaagensenJohn [name], male   DOB: [birth year], 50 y.o.   MRN: [id number]  Pt currently presents with an appropriate affect and depressed behavior. Per self inventory, pt rates depression at a 6, hopelessness 2 and anxiety 7. Pt's daily goal is to "me" and they intend to do so by "focus on me." Pt reports good sleep, a fair appetite, normal energy and good concentration.   Pt provided with medications per providers orders. Pt's labs and vitals were monitored throughout the day. Pt supported emotionally and encouraged to express concerns and questions. Pt educated on medications. Pt assisted with dressing change. Wounds assessed, no erythema or purulent drainage noted.   Pt's safety ensured with 15 minute and environmental checks. Pt currently denies SI/HI and A/V hallucinations. Pt verbally agrees to seek staff if SI/HI or A/VH occurs and to consult with staff before acting on any harmful thoughts. Will continue POC.

## 2015-09-03 NOTE — Progress Notes (Signed)
Patient ID: Benjamin [name], male   DOB: 08-02-1965, 50 y.o.   MRN: [id number] [facility] MD Progress Note  09/03/2015 1:17 PM [place]  MRN:  [id number]  Subjective: patient reports increasing anxiety related to disposition planning. States that although better than before, he still feels overwhelmed with even relatively minor stressors. His main concern at this time is that he does not want to be alone after discharge, and that his GF is currently out of state in [place] until later this month. This has caused him to feel more anxious, and states that when he feels " like this", passive SI return, but denies any current plan or intention of hurting self or of SI. States he had a panic attack yesterday, and does present with increased anxiety today. Denies medication side effects.  Objective : I have discussed case with treatment team and have met with patient .  As above, patient reports increased anxiety as well as some depression and passive SI, in the context of feeling nervous about discharging while his GF is out of town . He denies any suicidal plan or intention, no self injurious behaviors on unit, presents calm, in no acute distress, but ruminative about above. We have reviewed disposition planning options with patient, CSW, and with his GF, Margreta Journey, via phone today. Patient is now planning on travelling to [facility] on Tuesday ( flying out ) to spend a week or so with GF and her family there, and then return home to [facility] with her later this week, at which time he would consider IOP level of care or similar. Once this disposition plan clarified, his mood improved, and his anxiety, which was significant , abated .  Would continues to heal well, denies exudate and pain significantly decreased . Denies medication side effects, he feels medications are helping.    Principal Problem: MDD (major depressive disorder), recurrent episode, severe ([place]) Diagnosis:   Patient Active Problem List   Diagnosis Date  Noted  . Severe episode of recurrent major depressive disorder, without psychotic features ([place]) [F33.2]   . MDD (major depressive disorder), recurrent episode, severe ([place]) [F33.2] 08/24/2015  . Traumatic hematoma of forearm [S50.10XA] 08/16/2015  . Traumatic hematoma of right forearm [S50.11XA] 08/15/2015  . Insomnia [G47.00]   . MDD (major depressive disorder), single episode, severe , no psychosis ([place]) [F32.2] 08/14/2015  . Cocaine use disorder, mild, abuse [F14.10] 08/14/2015  . Cannabis use disorder, mild, abuse [F12.10] 08/14/2015  . Opioid use disorder, mild, abuse [F11.10] 08/14/2015  . Suicide attempt ([place]) [T14.91] 08/10/2015  . Laceration of right arm with complication [X45.038U] 82/80/0349  . Uncontrolled hypertension [I10] 08/10/2015  . Dehydration [E86.0] 08/10/2015  . Weight loss, non-intentional [R63.4] 08/10/2015  . Depression [F32.9] 08/10/2015  . Essential hypertension [I10]   . Vaccine refused by patient [Z28.20] 05/24/2015  . Routine general medical examination at a health care facility [Z00.00] 05/24/2015  . Chronic back pain [M54.9, G89.29] 05/24/2015  . Abnormal serum creatinine level [R79.9] 05/24/2015  . Family history of cancer [Z80.9] 05/24/2015  . Screening for prostate cancer [Z12.5] 05/24/2015  . Smoker [Z72.0] 05/24/2015  . Cough [R05] 05/24/2015  . Screen for STD (sexually transmitted disease) [Z11.3] 05/24/2015  . Erectile dysfunction [N52.9] 01/31/2014   Total Time spent with patient: 25 minutes     Past Medical History:  Past Medical History  Diagnosis Date  . Erectile dysfunction   . Chronic back pain     [facility]  Past Surgical History  Procedure Laterality Date  . Ankle surgery      right  . Hand surgery      fracture, ORIG, teenage years, right.  . Lumbar epidural injection      [facility]  . Tendon repair N/A 07/24/2015    Procedure: WRIST LACERATION AND TENDON REPAIR;  Surgeon:  Iran Planas, MD;  Location: [place];  Service: Orthopedics;  Laterality: N/A;  . Wound exploration Right 08/15/2015    Procedure: WOUND EXPLORATION right forearm;  Surgeon: Justice Britain, MD;  [facility];  Service: Orthopedics;  Laterality: Right;  . Incision and drainage of wound Right 08/15/2015    Procedure: IRRIGATION AND DEBRIDEMENT WOUND;  Surgeon: Justice Britain, MD;  [facility];  Service: Orthopedics;  Laterality: Right;  . Skin split graft Right 08/17/2015    Procedure: I&D RIGHT FOREARM/POSSIBLE SKIN GRAFT;  Surgeon: Roseanne Kaufman, MD;  Location: [place];  Service: Orthopedics;  Laterality: Right;  . Application of wound vac Right 08/17/2015    Procedure: APPLICATION OF WOUND VAC;  Surgeon: Roseanne Kaufman, MD;  Location: [place];  Service: Orthopedics;  Laterality: Right;  . I&d extremity Right 08/10/2015    Procedure: IRRIGATION AND DEBRIDEMENT SKIN, SUBCUTANEOS TISSUE AND  MUSCLE, CARPAL TUNNEL RELEASE, MEDIAN NERVE LYSIS WITH NERVE WRAP;  Surgeon: Roseanne Kaufman, MD;  Location: [place];  Service: Orthopedics;  Laterality: Right;   Family History:  Family History  Problem Relation Age of Onset  . Cancer Mother     lung  . Anxiety disorder Mother   . Cancer Father     throat  . Diabetes Paternal Grandmother   . Heart disease Neg Hx   . Stroke Neg Hx     Social History:  History  Alcohol Use  . Yes    Comment: occasional     History  Drug Use  . Yes  . Special: Marijuana, Cocaine    Comment: last use cocaine 08/08/15, last use MJ 08/08/15    Social History   Social History  . Marital Status: Single    Spouse Name: N/A  . Number of Children: N/A  . Years of Education: N/A   Social History Main Topics  . Smoking status: Current Every Day Smoker -- 0.50 packs/day for 6 years    Types: Cigarettes  . Smokeless tobacco: None  . Alcohol Use: Yes     Comment: occasional  . Drug Use: Yes    Special: Marijuana, Cocaine     Comment: last use cocaine 08/08/15, last  use MJ 08/08/15  . Sexual Activity: Not Asked   Other Topics Concern  . 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   No children   Additional Social History:   Sleep: did not sleep well last night which he attributes to worrying about discharging   Appetite:  Improving   Current Medications: Current Facility-Administered Medications  Medication Dose Route Frequency Provider Last Rate Last Dose  . acetaminophen (TYLENOL) tablet 650 mg  650 mg Oral Q6H PRN Jenne Campus, MD   650 mg at 09/02/15 1459  . alum & mag hydroxide-simeth (MAALOX/MYLANTA) 200-200-20 MG/5ML suspension 30 mL  30 mL Oral Q6H PRN Myer Peer Maelyn Berrey, MD      . feeding supplement (ENSURE ENLIVE) (ENSURE ENLIVE) liquid 237 mL  237 mL Oral BID BM Myer Peer Burtis Imhoff, MD   237 mL at 09/03/15 0811  .  hydrOXYzine (ATARAX/VISTARIL) tablet 25 mg  25 mg Oral Q6H PRN Jenne Campus, MD      . hydrOXYzine (ATARAX/VISTARIL) tablet 50 mg  50 mg Oral QHS PRN Jenne Campus, MD   50 mg at 09/02/15 2242  . magnesium hydroxide (MILK OF MAGNESIA) suspension 30 mL  30 mL Oral Daily PRN Jenne Campus, MD      . multivitamin with minerals tablet 1 tablet  1 tablet Oral Daily Jenne Campus, MD   1 tablet at 09/03/15 0756  . oxyCODONE (Oxy IR/ROXICODONE) immediate release tablet 5 mg  5 mg Oral Q8H PRN Jenne Campus, MD   5 mg at 09/03/15 0756  . sertraline (ZOLOFT) tablet 100 mg  100 mg Oral Daily Jenne Campus, MD   100 mg at 09/03/15 0756    Lab Results:  No results found for this or any previous visit (from the past 48 hour(s)).  Blood Alcohol level:  Lab Results  Component Value Date   ETH <5 08/10/2015    Physical Findings: AIMS:  , ,  ,  ,    CIWA:    COWS:     Musculoskeletal: Strength & Muscle Tone: within normal limits Gait & Station: normal Patient leans: N/A  Psychiatric Specialty Exam: Physical Exam  Nursing note and vitals  reviewed. Constitutional: He is oriented to person, place, and time. He appears well-developed.  Neurological: He is alert and oriented to person, place, and time.  Psychiatric: He has a normal mood and affect. His behavior is normal.    Review of Systems  Psychiatric/Behavioral: Positive for depression. Negative for suicidal ideas. The patient is nervous/anxious.    denies chest pain, no shortness of breath, no drainage or bleeding from wound , describes improving wound  pain   Blood pressure 133/83, pulse 68, temperature 98.6 F (37 C), temperature source Oral, resp. rate 16, height 6' 2"  (1.88 m), weight 182 lb (82.555 kg), SpO2 100 %.Body mass index is 23.36 kg/(m^2).  General Appearance: improved grooming   Eye Contact:  Good  Speech:  Normal Rate  Volume:  Normal  Mood:  More depressed, anxious today, but improved  Partially as disposition plans were worked out - see above  Affect: remains anxious   Thought Process:  Linear  Orientation:  Full (Time, Place, and Person)  Thought Content:  denies hallucinations, no delusions, ruminative   Suicidal Thoughts:  Reported some passive thoughts of death, dying today, but denies any suicidal plan or intention and contracts for safety on unit   Homicidal Thoughts:  No  Memory:  recent and remote grossly intact   Judgement:  Other:  improving   Insight:  improving  Psychomotor Activity:  Improving   Concentration:  Concentration: Good and Attention Span: Good  Recall:  Good  Fund of Knowledge:  Good  Language:  Good  Akathisia:  Negative  Handed:  Left  AIMS (if indicated):     Assets:  Desire for Improvement Resilience  ADL's:  Intact  Cognition:  WNL  Sleep:  Number of Hours: 3.25   Assessment - patient reported increased anxiety and depression , particularly in regards to disposition planning, patient reports significant fear and concern about being alone after discharge, and his GF, with whom he lives , is out of town until later  this month. States that normally he would not be concerned about this, but still feels somewhat emotionally frail, and today reported some passive SI , as well as  a panic attack last evening, in the context of these ruminations. This issue was partially resolved as Probation officer, treatment team spoke with patient and GF and it was arranged for him to travel to [facility] on discharge in order to spend time with her and her family there. His mood improved once this was clarified. Denies medication side effects.   Treatment Plan Summary: Daily contact with patient to assess and evaluate symptoms and progress in treatment, Medication management, Plan inpatient treatment  and medications as below   Encourage increased milieu and group participation Continue Zoloft   100   mgs  PO QDAY  for depression/anxiety. Continue  Oxycodone 5 mgrs   8 hours PRNs for pain as needed,as pain level improving  Continue Vistaril 25 mgrs Q 6 hours PRN for anxiety, as needed. Continue  Vistaril 50 mg PO QHS for insomnia, as needed  Treatment team working on disposition options   Neita Garnet, MD 09/03/2015, 1:17 PM

## 2015-09-04 MED ORDER — HYDROXYZINE HCL 50 MG PO TABS
50.0000 mg | ORAL_TABLET | Freq: Two times a day (BID) | ORAL | Status: DC
  Administered 2015-09-04: 50 mg via ORAL
  Filled 2015-09-04 (×7): qty 1

## 2015-09-04 MED ORDER — TRAZODONE HCL 50 MG PO TABS
ORAL_TABLET | ORAL | Status: AC
  Administered 2015-09-04: 50 mg
  Filled 2015-09-04: qty 1

## 2015-09-04 MED ORDER — SERTRALINE HCL 25 MG PO TABS
125.0000 mg | ORAL_TABLET | Freq: Every day | ORAL | Status: DC
  Administered 2015-09-05: 125 mg via ORAL
  Filled 2015-09-04 (×4): qty 1

## 2015-09-04 MED ORDER — TRAZODONE HCL 50 MG PO TABS
50.0000 mg | ORAL_TABLET | Freq: Every evening | ORAL | Status: DC | PRN
  Administered 2015-09-05 – 2015-09-06 (×2): 50 mg via ORAL
  Filled 2015-09-04 (×2): qty 1

## 2015-09-04 NOTE — Progress Notes (Signed)
Patient ID: Benjamin [name], male   DOB: 02/19/1966, 50 y.o.   MRN: [id number] [facility] MD Progress Note  09/04/2015 2:43 PM Benjamin [name]  MRN:  [id number]  Subjective: Patient complain having increased anxiety to   panic symptoms last night and this morning and requesting better medication management. Patient reportedly taking Zoloft 100 mg which Him but he still needed a higher dose of the medication. Patient is also requesting additional medication like hydroxyzine as a standard dose and staff as needed. Patient appeared taking his pain medication management from the staff RN. Patient reportedly asking to talk to this provider regarding his medication changes. Patient denies current suicidal/homicidal ideation intention or plans. Patient has no evidence of psychosis. His main concern at this time is that he does not want to be alone after discharge, and that his GF is currently out of state in [place] until later this month. Denies medication side effects. Patient stated he has been refusing to accept he has a depression anxiety over several years even though his family members are telling him. Patient reported he has been self-medicating with the tobacco smoking and drinking. In the past.   Objective : Patient seen face-to-face for this evaluation and reviewed the available information and also case discussed with the staff RN for appropriate medication management. Patient reported he has a panic episode last evening and this morning and this worried about having another episode. Patient is requesting higher dose of Zoloft because it is helping for depression but not for anxiety and also for asking addition medication for anxiety. He denies any suicidal plan or intention, no self injurious behaviors on unit, presents calm, in no acute distress, but ruminative about above. He would consider IOP level of care or similar. Once this disposition plan clarified, his mood improved, and his anxiety, which was significant  , abated .   Principal Problem: MDD (major depressive disorder), recurrent episode, severe ([facility]) Diagnosis:   Patient Active Problem List   Diagnosis Date Noted  . Severe episode of recurrent major depressive disorder, without psychotic features ([facility]) [F33.2]   . MDD (major depressive disorder), recurrent episode, severe ([facility]) [F33.2] 08/24/2015  . Traumatic hematoma of forearm [S50.10XA] 08/16/2015  . Traumatic hematoma of right forearm [S50.11XA] 08/15/2015  . Insomnia [G47.00]   . MDD (major depressive disorder), single episode, severe , no psychosis ([facility]) [F32.2] 08/14/2015  . Cocaine use disorder, mild, abuse [F14.10] 08/14/2015  . Cannabis use disorder, mild, abuse [F12.10] 08/14/2015  . Opioid use disorder, mild, abuse [F11.10] 08/14/2015  . Suicide attempt ([facility]) [T14.91] 08/10/2015  . Laceration of right arm with complication [S41.111A] 08/10/2015  . Uncontrolled hypertension [I10] 08/10/2015  . Dehydration [E86.0] 08/10/2015  . Weight loss, non-intentional [R63.4] 08/10/2015  . Depression [F32.9] 08/10/2015  . Essential hypertension [I10]   . Vaccine refused by patient [Z28.20] 05/24/2015  . Routine general medical examination at a health care facility [Z00.00] 05/24/2015  . Chronic back pain [M54.9, G89.29] 05/24/2015  . Abnormal serum creatinine level [R79.9] 05/24/2015  . Family history of cancer [Z80.9] 05/24/2015  . Screening for prostate cancer [Z12.5] 05/24/2015  . Smoker [Z72.0] 05/24/2015  . Cough [R05] 05/24/2015  . Screen for STD (sexually transmitted disease) [Z11.3] 05/24/2015  . Erectile dysfunction [N52.9] 01/31/2014   Total Time spent with patient: 25 minutes     Past Medical History:  Past Medical History  Diagnosis Date  . Erectile dysfunction   . Chronic back pain     [facility]    Past Surgical History  Procedure Laterality Date  . Ankle surgery      right  . Hand surgery      fracture, ORIG, teenage years,  right.  . Lumbar epidural injection      [facility]  . Tendon repair N/A 07/24/2015    Procedure: WRIST LACERATION AND TENDON REPAIR;  Surgeon: Bradly BienenstockFred Ortmann, MD;  [facility];  Service: Orthopedics;  Laterality: N/A;  . Wound exploration Right 08/15/2015    Procedure: WOUND EXPLORATION right forearm;  Surgeon: Francena HanlyKevin Supple, MD;  [facility];  Service: Orthopedics;  Laterality: Right;  . Incision and drainage of wound Right 08/15/2015    Procedure: IRRIGATION AND DEBRIDEMENT WOUND;  Surgeon: Francena HanlyKevin Supple, MD;  [facility];  Service: Orthopedics;  Laterality: Right;  . Skin split graft Right 08/17/2015    Procedure: I&D RIGHT FOREARM/POSSIBLE SKIN GRAFT;  Surgeon: Dominica SeverinWilliam Gramig, MD;  [facility];  Service: Orthopedics;  Laterality: Right;  . Application of wound vac Right 08/17/2015    Procedure: APPLICATION OF WOUND VAC;  Surgeon: Dominica SeverinWilliam Gramig, MD;  [facility];  Service: Orthopedics;  Laterality: Right;  . I&d extremity Right 08/10/2015    Procedure: IRRIGATION AND DEBRIDEMENT SKIN, SUBCUTANEOS TISSUE AND  MUSCLE, CARPAL TUNNEL RELEASE, MEDIAN NERVE LYSIS WITH NERVE WRAP;  Surgeon: Dominica SeverinWilliam Gramig, MD;  [facility];  Service: Orthopedics;  Laterality: Right;   Family History:  Family History  Problem Relation Age of Onset  . Cancer Mother     lung  . Anxiety disorder Mother   . Cancer Father     throat  . Diabetes Paternal Grandmother   . Heart disease Neg Hx   . Stroke Neg Hx     Social History:  History  Alcohol Use  . Yes    Comment: occasional     History  Drug Use  . Yes  . Special: Marijuana, Cocaine    Comment: last use cocaine 08/08/15, last use MJ 08/08/15    Social History   Social History  . Marital Status: Single    Spouse Name: N/A  . Number of Children: N/A  . Years of Education: N/A   Social History Main Topics  . Smoking status: Current Every Day Smoker -- 0.50 packs/day for 6 years    Types: Cigarettes  .  Smokeless tobacco: None  . Alcohol Use: Yes     Comment: occasional  . Drug Use: Yes    Special: Marijuana, Cocaine     Comment: last use cocaine 08/08/15, last use MJ 08/08/15  . Sexual Activity: Not Asked   Other Topics Concern  . 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   No children   Additional Social History:   Sleep: did not sleep well last night which he attributes to worrying about discharging   Appetite:  Improving   Current Medications: Current Facility-Administered Medications  Medication Dose Route Frequency Provider Last Rate Last Dose  . acetaminophen (TYLENOL) tablet 650 mg  650 mg Oral Q6H PRN Craige CottaFernando A Cobos, MD   650 mg at 09/04/15 1115  . alum & mag hydroxide-simeth (MAALOX/MYLANTA) 200-200-20 MG/5ML suspension 30 mL  30 mL Oral Q6H PRN Rockey SituFernando A Cobos, MD      . feeding supplement (ENSURE ENLIVE) (ENSURE ENLIVE) liquid 237 mL  237 mL Oral BID BM Craige CottaFernando A Cobos, MD   [phone]  mL at 09/04/15 1412  . hydrOXYzine (ATARAX/VISTARIL) tablet 50 mg  50 mg Oral BID Leata Mouse, MD      . magnesium hydroxide (MILK OF MAGNESIA) suspension 30 mL  30 mL Oral Daily PRN Craige Cotta, MD      . multivitamin with minerals tablet 1 tablet  1 tablet Oral Daily Craige Cotta, MD   1 tablet at 09/04/15 [phone]  . oxyCODONE (Oxy IR/ROXICODONE) immediate release tablet 5 mg  5 mg Oral Q8H PRN Craige Cotta, MD   5 mg at 09/04/15 1115  . [START ON 09/05/2015] sertraline (ZOLOFT) tablet 125 mg  125 mg Oral Daily Leata Mouse, MD        Lab Results:  No results found for this or any previous visit (from the past 48 hour(s)).  Blood Alcohol level:  Lab Results  Component Value Date   ETH <5 08/10/2015    Physical Findings: AIMS:  , ,  ,  ,    CIWA:    COWS:     Musculoskeletal: Strength & Muscle Tone: within normal limits Gait & Station: normal Patient leans:  N/A  Psychiatric Specialty Exam: Physical Exam  Nursing note and vitals reviewed. Constitutional: He is oriented to person, place, and time. He appears well-developed.  Neurological: He is alert and oriented to person, place, and time.  Psychiatric: He has a normal mood and affect. His behavior is normal.    Review of Systems  Psychiatric/Behavioral: Positive for depression. Negative for suicidal ideas. The patient is nervous/anxious.    denies chest pain, no shortness of breath, no drainage or bleeding from wound , describes improving wound  pain   Blood pressure 157/93, pulse 72, temperature 99 F (37.2 C), temperature source Oral, resp. rate 16, height  (1.88 m), weight 82.555 kg (182 lb), SpO2 100 %.Body mass index is 23.36 kg/(m^2).  General Appearance: improved grooming   Eye Contact:  Good  Speech:  Normal Rate  Volume:  Normal  Mood:  More depressed, anxious today, but improved  Partially as disposition plans were worked out - see above  Affect: remains anxious   Thought Process:  Linear  Orientation:  Full (Time, Place, and Person)  Thought Content:  denies hallucinations, no delusions, ruminative   Suicidal Thoughts:  Reported some passive thoughts of death, dying today, but denies any suicidal plan or intention and contracts for safety on unit   Homicidal Thoughts:  No  Memory:  recent and remote grossly intact   Judgement:  Other:  improving   Insight:  improving  Psychomotor Activity:  Improving   Concentration:  Concentration: Good and Attention Span: Good  Recall:  Good  Fund of Knowledge:  Good  Language:  Good  Akathisia:  Negative  Handed:  Left  AIMS (if indicated):     Assets:  Desire for Improvement Resilience  ADL's:  Intact  Cognition:  WNL  Sleep:  Number of Hours: 6.75   Assessment - patient reported increased anxiety and depression , particularly in regards to disposition planning, patient reports significant fear and concern about being alone  after discharge, and his GF, with whom he lives , is out of town until later this month. States that normally he would not be concerned about this, but still feels somewhat emotionally frail, and today reported some passive SI , as well as a panic attack last evening, in the context of these ruminations. This issue was partially resolved as Clinical research associate, treatment team spoke  with patient and GF and it was arranged for him to travel to [facility] on discharge in order to spend time with her and her family there. His mood improved once this was clarified. Denies medication side effects.   Treatment Plan Summary: Daily contact with patient to assess and evaluate symptoms and progress in treatment, Medication management, Plan inpatient treatment  and medications as below   Encourage increased milieu and group participation Increase Zoloft  125   mgs  PO QDAY  for depression/anxiety. Continue  Oxycodone 5 mgrs   8 hours PRNs for pain as needed,as pain level improving  Continue Vistaril 50 mgrs twice a day for anxiety Treatment team working on disposition options   Leata Mouse, MD 09/04/2015, 2:43 PM

## 2015-09-04 NOTE — Plan of Care (Signed)
Problem: Activity: Goal: Interest or engagement in leisure activities will improve Outcome: Progressing Patient was asking questions about other resources/activities other than meds, that can help increase mood, decrease depression. We discussed exercise, bing outside and interacting with other people. As well as getting enough rest.

## 2015-09-04 NOTE — [facility] Group Notes (Signed)
[facility] LCSW Group Therapy  09/04/2015    9:30 - 10:30 AM  Type of Therapy:  Group Therapy  Participation Level:  Did Not Attend although CSW went to patient's room and encouraged him to attend  Carney Bernatherine C Harrill, LCSW

## 2015-09-04 NOTE — [facility] Group Notes (Signed)
Patient attend group. His goal was a 7. He did not meet goal.

## 2015-09-04 NOTE — Progress Notes (Signed)
Adult Psychoeducational Group Note  Date:  09/04/2015 Time:  2:01 AM  Group Topic/Focus:  Wrap-Up Group:   The focus of this group is to help patients review their daily goal of treatment and discuss progress on daily workbooks.  Participation Level:  Active  Participation Quality:  Attentive  Affect:  Appropriate  Cognitive:  Appropriate  Insight: Good  Engagement in Group:  Engaged  Modes of Intervention:  Discussion  Additional Comments:   Valena Ivanov H 09/04/2015, 2:01 AM

## 2015-09-04 NOTE — Progress Notes (Signed)
Adult Psychoeducational Group Note  Date:  09/04/2015 Time:  9:58 AM  Group Topic/Focus:  Recovery Goals:   The focus of this group is to identify appropriate goals for recovery and establish a plan to achieve them.  Participation Level:  Did Not Attend  Almira Barenny G Pearlean Sabina 09/04/2015, 9:58 AM

## 2015-09-04 NOTE — Progress Notes (Signed)
D). Patient denies SI/HI/AVH. Calm and cooperative.  Patient interacting well with staff and other patients. Attending groups. Did not want nurse to clean or dress his wound on his right forearm. Did get pain meds for pain of 5/10 in arm, and redressed it himself. On self inventory patient also denies SI. Reports 6/10 for depression, 5/10 for hopelessness and 8/10 for anxiety. A). Decrease in pain with med, to 1/10. Emotional support and encouragement offered. Education provided on medication, indications and side effect. Q 15 minute checks done for safety. R). Continue to maintain safety checks. Continue to take medications as prescribed. Continue to complete self inventory and attend groups.

## 2015-09-04 NOTE — Progress Notes (Signed)
D: Patient seen watching TV and interacting with peers on day hall. C/O anxiety and requested for "anxiety pill". Denies pain, SI, AH/VH at this time. No behavioral issues noted.  A: Staff offered support and encouragement. Meds given as ordered. Every 15 minutes check for safety maintained. Will continue to monitor patient for safety and stability. R: Patient remains safe.

## 2015-09-05 MED ORDER — HYDROXYZINE HCL 50 MG PO TABS
50.0000 mg | ORAL_TABLET | Freq: Four times a day (QID) | ORAL | Status: DC | PRN
  Administered 2015-09-05 – 2015-09-06 (×2): 50 mg via ORAL
  Filled 2015-09-05 (×2): qty 1

## 2015-09-05 MED ORDER — SERTRALINE HCL 50 MG PO TABS
150.0000 mg | ORAL_TABLET | Freq: Every day | ORAL | Status: DC
  Administered 2015-09-06 – 2015-09-07 (×2): 150 mg via ORAL
  Filled 2015-09-05 (×3): qty 1

## 2015-09-05 MED ORDER — CLONAZEPAM 0.5 MG PO TABS
0.5000 mg | ORAL_TABLET | Freq: Two times a day (BID) | ORAL | Status: DC
  Administered 2015-09-05 – 2015-09-06 (×2): 0.5 mg via ORAL
  Filled 2015-09-05 (×2): qty 1

## 2015-09-05 NOTE — Progress Notes (Signed)
D Linford did not want to get OOB this morning and take his 0800 meds. When he did get up ( at  1000), he presented to this Clinical research associatewriter. He says  he did not sleep well last night  That he doesn't like how the '  vistaril made me feel'. He says " i know I'm anxious..I  have learned that that's something I have to learn to deal with..  I need medication to help me with that that doesn't make me feel so TIRED and medication that I can drive and function while taking.". A HE admits that he  Now feels agitated, anxious and in pain. HE requests pain medication and he is given oxycodone per MD order. R Safety in place. Pt stated he will cont to practice new coping skills that he has learned here.

## 2015-09-05 NOTE — Progress Notes (Signed)
D: Patient spent time on phone. Presents flat affect. Complained of insomnia and requested to get "sleeping pill" to help him sleep. Drenda FreezeFran NP, notified - ordered Trazodone 50 mg every bedtime. Denies pain, SI, AH/VH at this time. Patient made no further complaint. No behavioral issues noted.  A:Staff offered support and encouragement. Due/prn of medications given as ordered. Every 15 minutes check for safety maintained. Will continue to monitor patient for safety and stabiliy.  Patient remains cooperative and safe.

## 2015-09-05 NOTE — Tx Team (Deleted)
Initial Interdisciplinary Treatment Plan   PATIENT STRESSORS: Financial difficulties Health problems Marital or family conflict Substance abuse   PATIENT STRENGTHS: Capable of independent living Wellsite geologistCommunication skills General fund of knowledge Motivation for treatment/growth   PROBLEM LIST: Problem List/Patient Goals Date to be addressed Date deferred Reason deferred Estimated date of resolution  Depression "I have chronic depression all my life"      Suicidal ideation 'I took bunch of prescribed pills i have"                                                 DISCHARGE CRITERIA:  Ability to meet basic life and health needs Improved stabilization in mood, thinking, and/or behavior Motivation to continue treatment in a less acute level of care Need for constant or close observation no longer present  PRELIMINARY DISCHARGE PLAN: Attend aftercare/continuing care group Attend PHP/IOP Outpatient therapy  PATIENT/FAMIILY INVOLVEMENT: This treatment plan has been presented to and reviewed with the patient, Mady HaagensenJohn Malta, and/or family member.  The patient and family have been given the opportunity to ask questions and make suggestions.  Glenice LaineIbekwe, Karess Harner B 09/05/2015, 4:53 AM

## 2015-09-05 NOTE — [facility] Group Notes (Signed)
[facility] Group Notes:  (Nursing/MHT/Case Management/Adjunct)  Date:  09/05/2015  Time:  10:00 AM  Type of Therapy:  Nurse Education  Participation Level:  Did Not Attend  Summary of Progress/Problems: Did not attend despite invitation.  Maurine SimmeringShugart, Khamille Beynon M 09/05/2015, 10:00 AM

## 2015-09-05 NOTE — [facility] Group Notes (Signed)
[facility] LCSW Group Therapy  09/05/2015 10:00 AM  Type of Therapy:  Group Therapy  Participation Level:  Did Not Attend  :  Beverly SessionsLINDSEY, Caysie Minnifield J 09/05/2015, 3:15 PM

## 2015-09-05 NOTE — Progress Notes (Signed)
D.  Pt pleasant on approach, denies complaints other than continued pain at surgical site for which he is receiving pain medication.  Pt was positive for nightly wrap up group and was observed interacting appropriately with peers on the unit.  Pt denies SI/HI/hallucinations at this time.  Pt is anticipating discharge possibly tomorrow.  A.  Support and encouragement offered, medication given as ordered for pain.  R.  Pt remains safe on the unit, will continue to monitor.

## 2015-09-05 NOTE — Progress Notes (Signed)
[facility]  09/05/2015 1:51 PM Benjamin HaagensenJohn [name]  MRN:  [id number]  Subjective: Patient complain having increased anxiety and having the second panic episode yesterday. Patient has been compliant with his medication as prescribed and has no reported adverse affects. Patient reported his medication hydroxyzine is not controlling his panic episodes and requesting different medication. Patient is willing to take higher dose of Zoloft as needed for controlling both symptoms of depression and anxiety. Patient has denied any disturbance of sleep and appetite. Patient denies current suicidal/homicidal ideation intention or plans. Patient has no evidence of psychosis. Patient stated he has been refusing to accept he has a depression anxiety over several years even though his family members are telling him. Patient reported he has been self-medicating with the tobacco smoking and drinking. In the past.  Objective : Patient has a panic episode last evening and this worried about having another episode. Patient is requesting higher dose of Zoloft and anxiety medication other than hydroxyzine because is not helpful. He denies any suicidal plan or intention, no self injurious behaviors on unit. He would consider IOP level of care or similar. Once this disposition plan clarified, his mood improved, and his anxiety, which was significant , abated .   Principal Problem: MDD (major depressive disorder), recurrent episode, severe (HCC) Diagnosis:   Patient Active Problem List   Diagnosis Date Noted  . Severe episode of recurrent major depressive disorder, without psychotic features (HCC) [F33.2]   . MDD (major depressive disorder), recurrent episode, severe (HCC) [F33.2] 08/24/2015  . Traumatic hematoma of forearm [S50.10XA] 08/16/2015  . Traumatic hematoma of right forearm [S50.11XA] 08/15/2015  . Insomnia [G47.00]   . MDD (major depressive disorder), single episode, severe , no psychosis (HCC) [F32.2]  08/14/2015  . Cocaine use disorder, mild, abuse [F14.10] 08/14/2015  . Cannabis use disorder, mild, abuse [F12.10] 08/14/2015  . Opioid use disorder, mild, abuse [F11.10] 08/14/2015  . Suicide attempt (HCC) [T14.91] 08/10/2015  . Laceration of right arm with complication [S41.111A] 08/10/2015  . Uncontrolled hypertension [I10] 08/10/2015  . Dehydration [E86.0] 08/10/2015  . Weight loss, non-intentional [R63.4] 08/10/2015  . Depression [F32.9] 08/10/2015  . Essential hypertension [I10]   . Vaccine refused by patient [Z28.20] 05/24/2015  . Routine general medical examination at a health care facility [Z00.00] 05/24/2015  . Chronic back pain [M54.9, G89.29] 05/24/2015  . Abnormal serum creatinine level [R79.9] 05/24/2015  . Family history of cancer [Z80.9] 05/24/2015  . Screening for prostate cancer [Z12.5] 05/24/2015  . Smoker [Z72.0] 05/24/2015  . Cough [R05] 05/24/2015  . Screen for STD (sexually transmitted disease) [Z11.3] 05/24/2015  . Erectile dysfunction [N52.9] 01/31/2014   Total Time spent with patient: 25 minutes     Past Medical History:  Past Medical History  Diagnosis Date  . Erectile dysfunction   . Chronic back pain     managed [facility]    Past Surgical History  Procedure Laterality Date  . Ankle surgery      right  . Hand surgery      fracture, ORIG, teenage years, right.  . Lumbar epidural injection      [facility]  . Tendon repair N/A 07/24/2015    Procedure: WRIST LACERATION AND TENDON REPAIR;  Surgeon: Bradly BienenstockFred Ortmann, MD;  Location: [facility] OR;  Service: Orthopedics;  Laterality: N/A;  . Wound exploration Right 08/15/2015    Procedure: WOUND EXPLORATION right forearm;  Surgeon: Francena HanlyKevin Supple, MD;  Location: [facility] ORS;  Service: Orthopedics;  Laterality: Right;  . Incision and drainage of wound Right 08/15/2015    Procedure: IRRIGATION AND DEBRIDEMENT WOUND;  Surgeon: Francena Hanly, MD;  Location: [facility] ORS;  Service: Orthopedics;   Laterality: Right;  . Skin split graft Right 08/17/2015    Procedure: I&D RIGHT FOREARM/POSSIBLE SKIN GRAFT;  Surgeon: Dominica Severin, MD;  Location: [facility] OR;  Service: Orthopedics;  Laterality: Right;  . Application of wound vac Right 08/17/2015    Procedure: APPLICATION OF WOUND VAC;  Surgeon: Dominica Severin, MD;  Location: [facility] OR;  Service: Orthopedics;  Laterality: Right;  . I&d extremity Right 08/10/2015    Procedure: IRRIGATION AND DEBRIDEMENT SKIN, SUBCUTANEOS TISSUE AND  MUSCLE, CARPAL TUNNEL RELEASE, MEDIAN NERVE LYSIS WITH NERVE WRAP;  Surgeon: Dominica Severin, MD;  Location: [facility] OR;  Service: Orthopedics;  Laterality: Right;   Family History:  Family History  Problem Relation Age of Onset  . Cancer Mother     lung  . Anxiety disorder Mother   . Cancer Father     throat  . Diabetes Paternal Grandmother   . Heart disease Neg Hx   . Stroke Neg Hx     Social History:  History  Alcohol Use  . Yes    Comment: occasional     History  Drug Use  . Yes  . Special: Marijuana, Cocaine    Comment: last use cocaine 08/08/15, last use MJ 08/08/15    Social History   Social History  . Marital Status: Single    Spouse Name: N/A  . Number of Children: N/A  . Years of Education: N/A   Social History Main Topics  . Smoking status: Current Every Day Smoker -- 0.50 packs/day for 6 years    Types: Cigarettes  . Smokeless tobacco: None  . Alcohol Use: Yes     Comment: occasional  . Drug Use: Yes    Special: Marijuana, Cocaine     Comment: last use cocaine 08/08/15, last use MJ 08/08/15  . Sexual Activity: Not Asked   Other Topics Concern  . 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   No children   Additional Social History:   Sleep: did not sleep well last night which he attributes to worrying about discharging   Appetite:  Improving   Current Medications: Current Facility-Administered Medications   Medication Dose Route Frequency Provider Last Rate Last Dose  . acetaminophen (TYLENOL) tablet 650 mg  650 mg Oral Q6H PRN Craige Cotta, MD   650 mg at 09/05/15 1022  . alum & mag hydroxide-simeth (MAALOX/MYLANTA) 200-200-20 MG/5ML suspension 30 mL  30 mL Oral Q6H PRN Rockey Situ Cobos, MD      . feeding supplement (ENSURE ENLIVE) (ENSURE ENLIVE) liquid 237 mL  237 mL Oral BID BM Rockey Situ Cobos, MD   237 mL at 09/05/15 1000  . hydrOXYzine (ATARAX/VISTARIL) tablet 50 mg  50 mg Oral BID Leata Mouse, MD   50 mg at 09/04/15 1844  . magnesium hydroxide (MILK OF MAGNESIA) suspension 30 mL  30 mL Oral Daily PRN Craige Cotta, MD      . multivitamin with minerals tablet 1 tablet  1 tablet Oral Daily Craige Cotta, MD   1 tablet at 09/05/15 1022  . oxyCODONE (Oxy IR/ROXICODONE) immediate release tablet 5 mg  5 mg Oral Q8H PRN Craige Cotta, MD   5 mg at 09/05/15 1022  . sertraline (ZOLOFT) tablet  125 mg  125 mg Oral Daily Leata Mouse, MD   125 mg at 09/05/15 1022  . traZODone (DESYREL) tablet 50 mg  50 mg Oral QHS PRN Kristeen Mans, NP        Lab Results:  No results found for this or any previous visit (from the past 48 hour(s)).  Blood Alcohol level:  Lab Results  Component Value Date   ETH <5 08/10/2015    Physical Findings: AIMS:  , ,  ,  ,    CIWA:    COWS:     Musculoskeletal: Strength & Muscle Tone: within normal limits Gait & Station: normal Patient leans: N/A  Psychiatric Specialty Exam: Physical Exam  Nursing note and vitals reviewed. Constitutional: He is oriented to person, place, and time. He appears well-developed.  Neurological: He is alert and oriented to person, place, and time.  Psychiatric: He has a normal mood and affect. His behavior is normal.    Review of Systems  Psychiatric/Behavioral: Positive for depression. Negative for suicidal ideas. The patient is nervous/anxious.    denies chest pain, no shortness of breath, no  drainage or bleeding from wound , describes improving wound  pain   Blood pressure 130/100, pulse 72, temperature 98.2 F (36.8 C), temperature source Oral, resp. rate 18, height 6\' 2"  (1.88 m), weight 82.555 kg (182 lb), SpO2 100 %.Body mass index is 23.36 kg/(m^2).  General Appearance: improved grooming   Eye Contact:  Good  Speech:  Normal Rate  Volume:  Normal  Mood:  More anxious today, but improved  Partially as disposition plans were worked out - see above  Affect: remains anxious   Thought Process:  Linear  Orientation:  Full (Time, Place, and Person)  Thought Content:  denies hallucinations, no delusions, ruminative   Suicidal Thoughts:  passive thoughts of death, dying today, but denies any suicidal plan or intention and contracts for safety on unit   Homicidal Thoughts:  No  Memory:  recent and remote grossly intact   Judgement:  Other:  improving   Insight:  improving  Psychomotor Activity: Restless   Concentration:  Concentration: Good and Attention Span: Good  Recall:  Good  Fund of Knowledge:  Good  Language:  Good  Akathisia:  Negative  Handed:  Left  AIMS (if indicated):     Assets:  Desire for Improvement Resilience  ADL's:  Intact  Cognition:  WNL  Sleep:  Number of Hours: 6.75   Assessment - Patient reported increased anxiety and depression , particularly in regards to disposition planning, patient reports significant fear and concern about being alone after discharge, and his GF, with whom he lives , is out of town until later this month. States that normally he would not be concerned about this, but still feels somewhat emotionally frail, and today reported some passive SI , as well as a panic attack last evening, in the context of these ruminations. This issue was partially resolved as Clinical research associate, treatment team spoke with patient and GF and it was arranged for him to travel to [facility] on discharge in order to spend time with her and her family there. His mood improved  once this was clarified.  Treatment Plan Summary: Daily contact with patient to assess and evaluate symptoms and progress in treatment, Medication management, Plan inpatient treatment  and medications as below   Encourage increased milieu and group participation Increase Zoloft  150  mgs  PO QDAY  for depression We start clonazepam 0.5 mg  2 times daily for increased anxiety Continue  Oxycodone 5 mgrs   8 hours PRNs for pain as needed,as pain level improving  Change Vistaril 50 mgrs every 6 hours when necessary for anxiety Treatment team working on disposition options   Leata Mouse, MD 09/05/2015, 1:51 PM

## 2015-09-06 MED ORDER — LORAZEPAM 0.5 MG PO TABS
0.5000 mg | ORAL_TABLET | Freq: Two times a day (BID) | ORAL | Status: DC
  Administered 2015-09-06: 0.5 mg via ORAL
  Filled 2015-09-06 (×2): qty 1

## 2015-09-06 MED ORDER — OXYCODONE HCL 5 MG PO TABS
5.0000 mg | ORAL_TABLET | Freq: Two times a day (BID) | ORAL | Status: DC | PRN
  Administered 2015-09-06 – 2015-09-07 (×2): 5 mg via ORAL
  Filled 2015-09-06 (×2): qty 1

## 2015-09-06 NOTE — Progress Notes (Signed)
Patient states, "I had a good day". Patient stated something positive that happened today was the doctor put him on new medication. Patient stated he will be discharging tomorrow.

## 2015-09-06 NOTE — Tx Team (Addendum)
Interdisciplinary Treatment Plan Update (Adult) Date: 09/06/2015   Date: 09/06/2015 9:28 AM  Progress in Treatment:  Attending groups: Yes Participating in groups: Yes Taking medication as prescribed: Yes  Tolerating medication: Yes  Family/Significant othe contact made: No, Pt declines Patient understands diagnosis: Yes AEB seeking help with depression Discussing patient identified problems/goals with staff: Yes  Medical problems stabilized or resolved: Yes  Denies suicidal/homicidal ideation: Yes Patient has not harmed self or Others: Yes   New problem(s) identified: None identified at this time.   Discharge Plan or Barriers: Patient is undecided about returning home with outpatient vs residential treatment at discharge. Family meeting with fiance on Wednesday to discuss discharge plans  09/06/2015: Pt plans to discharge to [place] to live with his girlfriend. CSW to assess for follow-up options there  Additional comments:  Patient and CSW reviewed pt's identified goals and treatment plan. Patient verbalized understanding and agreed to treatment plan. CSW reviewed [facility] "Discharge Process and Patient Involvement" Form. Pt verbalized understanding of information provided and signed form.   Reason for Continuation of Hospitalization:  Depression Medication stabilization Suicidal ideation  Estimated length of stay: 1 day  Review of initial/current patient goals per problem list:   1.  Goal(s): Patient will participate in aftercare plan  Met:  Yes  Target date: 3-5 days from date of admission   As evidenced by: Patient will participate within aftercare plan AEB aftercare provider and housing plan at discharge being identified.  08/25/15: CSW to work with Pt to assess for appropriate discharge plan and faciliate appointments and referrals as needed prior to d/c. 6/5: Goal progressing. Patient is undecided about returning home with outpatient vs residential treatment at discharge.  Family meeting with fiance on Wednesday to discuss discharge plans 09/06/2015: Pt will discharge to [place] with girlfriend temporarily before returning to [place] to complete IOP; CSW to provide follow-up options in [place]  2.  Goal (s): Patient will exhibit decreased depressive symptoms and suicidal ideations.  Met:  Yes  Target date: 3-5 days from date of admission   As evidenced by: Patient will utilize self rating of depression at 3 or below and demonstrate decreased signs of depression or be deemed stable for discharge by MD. 08/25/15: Pt was admitted with symptoms of depression, rating 10/10. Pt continues to present with flat affect and depressive symptoms.  Pt will demonstrate decreased symptoms of depression and rate depression at 3/10 or lower prior to discharge. 6/5: Goal progressing. Patient rates depression at 6-7, denies SI.  09/06/2015: Pt rates depression at 4/10; MD feels that Pt's symptoms have decreased to the point that they can be managed in an outpatient setting.      Attendees: Patient:    Family:    Physician: Dr. Parke Poisson 08/31/2015 9:30 AM  Nursing: Chalmers Cater 08/31/2015 9:30 AM  Clinical Social Worker:  08/31/2015 9:30 AM  Other: Peri Maris, LCSWA; [place], LCSW  08/31/2015 9:30 AM  Other:  08/31/2015 9:30 AM  Other:  08/31/2015 9:30 AM  Other:  08/31/2015 9:30 AM  Other:           Peri Maris, [place] Social Work [phone]

## 2015-09-06 NOTE — Progress Notes (Signed)
D:  Patient's self inventory sheet, patient sleeps good, sleep medication is helpful.  Good appetite, normal energy level, good concentration.  Rated depression 4, hopeless 2, anxiety 3.  Denied withdrawals.  Denied SI.  Denied physical problems. Denied pain. A:  Medications administered per MD orders. Emotional support and encouragement given patient. R: Denied SI and HI, contracts for safety.  Denied A/V hallucinations.  Safety maintained with 15 minute checks.

## 2015-09-06 NOTE — [facility] Group Notes (Signed)
[facility] Group Notes:  (Nursing/MHT/Case Management/Adjunct)  Date:  09/06/2015  Time:  1:38 PM  Type of Therapy:  Psychoeducational Skills  Participation Level:  Did Not Attend  Participation Quality:    Affect:    Cognitive:    Insight:    Engagement in Group:    Modes of Intervention:    Summary of Progress/Problems:  Benjamin LoanFaiza  [name] 09/06/2015, 1:38 PM

## 2015-09-06 NOTE — [facility] Group Notes (Signed)
[facility]  09/06/2015 8:45 AM  Pt did not attend, declined invitation.   Chad CordialLauren Carter, LCSWA 09/06/2015 9:24 AM

## 2015-09-06 NOTE — Progress Notes (Signed)
R wrist cleaned with NS, 2x2's applied and wrapped  No signs/symptoms of redness, drainage or swelling.

## 2015-09-06 NOTE — Progress Notes (Signed)
Recreation Therapy Notes  Date: 06.12.2047 Time: 9:30am Location: [address] Group Room   Group Topic: Stress Management  Goal Area(s) Addresses:  Patient will actively participate in stress management techniques presented during session.   Behavioral Response: Did not attend.   Marykay Lexenise L Hevin Jeffcoat, LRT/CTRS         Javeria Briski L 09/06/2015 10:09 AM

## 2015-09-06 NOTE — BHH Suicide Risk Assessment (Addendum)
[facility] Discharge Suicide Risk Assessment   Principal Problem: MDD (major depressive disorder), recurrent episode, severe (HCC) Discharge Diagnoses:  Patient Active Problem List   Diagnosis Date Noted  . Severe episode of recurrent major depressive disorder, without psychotic features (HCC) [F33.2]   . MDD (major depressive disorder), recurrent episode, severe (HCC) [F33.2] 08/24/2015  . Traumatic hematoma of forearm [S50.10XA] 08/16/2015  . Traumatic hematoma of right forearm [S50.11XA] 08/15/2015  . Insomnia [G47.00]   . MDD (major depressive disorder), single episode, severe , no psychosis (HCC) [F32.2] 08/14/2015  . Cocaine use disorder, mild, abuse [F14.10] 08/14/2015  . Cannabis use disorder, mild, abuse [F12.10] 08/14/2015  . Opioid use disorder, mild, abuse [F11.10] 08/14/2015  . Suicide attempt (HCC) [T14.91] 08/10/2015  . Laceration of right arm with complication [S41.111A] 08/10/2015  . Uncontrolled hypertension [I10] 08/10/2015  . Dehydration [E86.0] 08/10/2015  . Weight loss, non-intentional [R63.4] 08/10/2015  . Depression [F32.9] 08/10/2015  . Essential hypertension [I10]   . Vaccine refused by patient [Z28.20] 05/24/2015  . Routine general medical examination at a health care facility [Z00.00] 05/24/2015  . Chronic back pain [M54.9, G89.29] 05/24/2015  . Abnormal serum creatinine level [R79.9] 05/24/2015  . Family history of cancer [Z80.9] 05/24/2015  . Screening for prostate cancer [Z12.5] 05/24/2015  . Smoker [Z72.0] 05/24/2015  . Cough [R05] 05/24/2015  . Screen for STD (sexually transmitted disease) [Z11.3] 05/24/2015  . Erectile dysfunction [N52.9] 01/31/2014    Total Time spent with patient: 30 minutes  Musculoskeletal: Strength & Muscle Tone: within normal limits Gait & Station: normal Patient leans: N/A  Psychiatric Specialty Exam: ROS no headache, no chest pain, no shortness of breath, gradually improving wrist, arm pain   Blood pressure 148/83, pulse  77, temperature 98.6 F (37 C), temperature source Oral, resp. rate 20, height  (1.88 m), weight 182 lb (82.555 kg), SpO2 100 %.Body mass index is 23.36 kg/(m^2).  General Appearance: well groomed   Eye Contact::  Good  Speech:  Normal Rate409  Volume:  Normal  Mood:  improved mood , states he is feeling "OK", denies significant depression at this time   Affect:  Appropriate, smiles at times appropriately  Thought Process:  Linear  Orientation:  Full (Time, Place, and Person)  Thought Content:  no hallucinations, no delusions   Suicidal Thoughts:  No denies any suicidal ideations , denies any self injurious ideations, denies homicidal or violent ideations   Homicidal Thoughts:  No  Memory:  recent and remote grossly intact   Judgement:  Improved   Insight:  improved   Psychomotor Activity:  Normal  Concentration:  Good  Recall:  Good  Fund of Knowledge:Good  Language: Good  Akathisia:  Negative  Handed:  Right  AIMS (if indicated):     Assets:  Desire for Improvement Resilience Social Support  Sleep:  Number of Hours: 6.75  Cognition: WNL  ADL's:  Intact   Mental Status Per Nursing Assessment::   On Admission:     Demographic Factors:  50 year old single male,  No children, will be living with GF.  Loss Factors: Mother passed away 2015-03-21.   Historical Factors: Two prior  Suicide attempts by cutting self, history of depression, worsened/ triggered by death of mother .   Risk Reduction Factors:   Living with another person, especially a relative, Positive social support and Positive coping skills or problem solving skills  Continued Clinical Symptoms:  At this time patient is alert, attentive, well related, improved mood,  and presents with fuller range of affect , no thought disorder, no HI, no SI, no psychotic ideations , future oriented. At this time denies medication side effects Patient states Klonopin has not been effective, states that Xanax , Ativan  better options for him. We reviewed-as per his request will D/C Klonopin, start Ativan 0.5 mgrs BID .   Cognitive Features That Contribute To Risk:  No gross cognitive deficits noted upon discharge. Is alert , attentive, and oriented x 3   Suicide Risk:  Mild:  Suicidal ideation of limited frequency, intensity, duration, and specificity.  There are no identifiable plans, no associated intent, mild dysphoria and related symptoms, good self-control (both objective and subjective assessment), few other risk factors, and identifiable protective factors, including available and accessible social support.    Plan Of Care/Follow-up recommendations:  Activity:  as tolerated Diet:  regular Tests:  NA Other:  see below  Patient is requesting for discharge. Patient [place] [facility] [place]where he plans to spend time with his GF, and then [place], plans to live with her. He is leaving early tomorrow morning in order to [place] [facility]- looking forward to discharge at this time  Encouraged to consider IOP participation on return  [place] [facility] to stay with GF and friends ( flies out early tomorrow, so disposition processing,planning is being done today)  . [place], and is interested in participation in IOP level of care .  Nehemiah MassedOBOS, FERNANDO, MD 09/06/2015, 3:26 PM   09/07/15, 10,00 AM - patient seen prior to discharge- in good spirits, [place] - [place]- and [facility] [place] [facility] [place] his GF . Plans to attend IOP on his return. Denies any SI. Patient states he prefers not to take Ativan , feels he can manage his anxiety without BZD s at this time.  Sallyanne HaversF Cobos , MD

## 2015-09-07 MED ORDER — OXYCODONE HCL 5 MG PO TABS: 5.0000 mg | ORAL_TABLET | Freq: Three times a day (TID) | ORAL | Status: DC | PRN

## 2015-09-07 MED ORDER — HYDROXYZINE HCL 50 MG PO TABS: 50.0000 mg | ORAL_TABLET | Freq: Four times a day (QID) | ORAL | Status: DC | PRN

## 2015-09-07 MED ORDER — TRAZODONE HCL 50 MG PO TABS: 50.0000 mg | ORAL_TABLET | Freq: Every evening | ORAL | Status: DC | PRN

## 2015-09-07 MED ORDER — SERTRALINE HCL 50 MG PO TABS: 150.0000 mg | ORAL_TABLET | Freq: Every day | ORAL | Status: DC

## 2015-09-07 NOTE — BHH Group Notes (Signed)
The focus of this group is to educate the patient on the purpose and policies of crisis stabilization and provide a format to answer questions about their admission.  The group details unit policies and expectations of patients while admitted.  Patient did not attend 0900 nurse education orientation group this morning.  Patient stayed in room.  

## 2015-09-07 NOTE — Progress Notes (Signed)
D: Pt presents with anxious affect and mood.  Pt reports he is leaving tomorrow.  Pt states he is "going to [place], going to be there for 2 weeks then I'm coming back here."  Pt reports he feels safe to discharge tomorrow.  Pt denies SI/HI, denies hallucinations, reports right arm pain of 6/10.  Pt has been visible in milieu interacting with peers and staff appropriately.   A: Introduced self to pt.  Met with pt and offered support and encouragement.  Actively listened to pt.  PRN medication administered for pain, anxiety, and sleep. R: Pt is compliant with medications.  He is safe on the unit.  Pt verbally contracts for safety.  Will continue to monitor and assess.

## 2015-09-07 NOTE — Progress Notes (Signed)
D:  Patient's self inventory sheet, patient sleeps good, sleep medication is helpful.  Good appetite, normal energy level, good concentration.  Rated depression 3, hopeless and anxiety 2.  Denied withdrawals.  Denied SI.  Denied physical problems.  Physical pain R arm, pain medication is helpful.  Goal is leaving today.  "Thank you for everything!" A:  Medications administered per MD orders, except patient refused ativan 0.5 mg this morning, stated this medication may make him drowsy since he will be discharged today.  Emotional support and encouragement given patient. R:  Denied SI and HI, contracts for safety.  Denied A/V hallucinations.  Safety maintained with 15 minute checks.

## 2015-09-07 NOTE — Progress Notes (Signed)
Discharge Note:  Patient discharged home with family member.  Patient denied SI and HI.  Denied A/V hallucinations. Suicide prevention information given and discussed with patient who stated he understood and had no questions.  Patient stated he received all his belongings, clothing, books, towels, umbrella, belt, wallet and cards, misc items, toiletries, etc.   Patient stated [facility].   All required discharge information given to patient at discharge.

## 2015-09-07 NOTE — Progress Notes (Signed)
  [facility] Discharge [facility] :  Will you be returning to the same living situation after discharge:  No. At discharge, do you have transportation home?: Yes,  Pt girlfriend to provide transportation Do you have the ability to pay for your medications: Yes,  Pt provided with prescriptions  Release of information consent forms completed and in the chart;  Patient's signature needed at discharge.  Patient to Follow up at: Follow-up Information    Follow up with [facility]   Specialty:  Behavioral Health   Why:  [place] [facility] will call you this week to set up your initial assessment for IOP.   Contact information:   [address] [id number] mc [address] [place] [id number] [phone]      Next level of care provider has [facility] Link:yes  Safety Planning and Suicide Prevention discussed: Yes,  with Pt; declined family contact  Have you used any form of tobacco in the last 30 days? (Cigarettes, Smokeless Tobacco, Cigars, and/or Pipes): Yes  Has patient been referred to the Quitline?: Patient refused referral  Patient has been referred for addiction treatment: Yes  Elaina HoopsCarter, Neysa Arts M 09/07/2015, 9:29 AM

## 2015-09-07 NOTE — Discharge Summary (Signed)
Physician Discharge Summary Note  Patient:  Benjamin [name] is an [age] y.o., male MRN:  [id number] DOB:  [birth year] Patient phone:  [phone] (home)  [place]   [address] [place] [id number],  Total Time spent with patient: Greater than 30 minutes  Date of Admission:  08/24/2015  Date of Discharge: 09-08-15  Reason for Admission: Worsening symptoms of depression triggering suicide attempts.  Principal Problem: MDD (major depressive disorder), recurrent episode, severe [facility](HCC)  Discharge Diagnoses: Patient Active Problem List   Diagnosis Date Noted  . Severe episode of recurrent major depressive disorder, without psychotic features (HCC) [F33.2]   . MDD (major depressive disorder), recurrent episode, severe (HCC) [F33.2] 08/24/2015  . Traumatic hematoma of forearm [S50.10XA] 08/16/2015  . Traumatic hematoma of right forearm [S50.11XA] 08/15/2015  . Insomnia [G47.00]   . MDD (major depressive disorder), single episode, severe , no psychosis (HCC) [F32.2] 08/14/2015  . Cocaine use disorder, mild, abuse [F14.10] 08/14/2015  . Cannabis use disorder, mild, abuse [F12.10] 08/14/2015  . Opioid use disorder, mild, abuse [F11.10] 08/14/2015  . Suicide attempt (HCC) [T14.91] 08/10/2015  . Laceration of right arm with complication [S41.111A] 08/10/2015  . Uncontrolled hypertension [I10] 08/10/2015  . Dehydration [E86.0] 08/10/2015  . Weight loss, non-intentional [R63.4] 08/10/2015  . Depression [F32.9] 08/10/2015  . Essential hypertension [I10]   . Vaccine refused by patient [Z28.20] 05/24/2015  . Routine general medical examination at a health care facility [Z00.00] 05/24/2015  . Chronic back pain [M54.9, G89.29] 05/24/2015  . Abnormal serum creatinine level [R79.9] 05/24/2015  . Family history of cancer [Z80.9] 05/24/2015  . Screening for prostate cancer [Z12.5] 05/24/2015  . Smoker [Z72.0] 05/24/2015  . Cough [R05] 05/24/2015  . Screen for STD (sexually transmitted  disease) [Z11.3] 05/24/2015  . Erectile dysfunction [N52.9] 01/31/2014   Past Psychiatric History:Cannabis abuse, Opioid use disorder, MDD, recurrent.  Past Medical History:  Past Medical History  Diagnosis Date  . Erectile dysfunction   . Chronic back pain     managed [facility]    Past Surgical History  Procedure Laterality Date  . Ankle surgery      right  . Hand surgery      fracture, ORIG, teenage years, right.  . Lumbar epidural injection      [facility]  . Tendon repair N/A 07/24/2015    Procedure: WRIST LACERATION AND TENDON REPAIR;  Surgeon: Bradly BienenstockFred Ortmann, MD;  Location: [facility] OR;  Service: Orthopedics;  Laterality: N/A;  . Wound exploration Right 08/15/2015    Procedure: WOUND EXPLORATION right forearm;  Surgeon: Francena HanlyKevin Supple, MD;  Location: [facility] ORS;  Service: Orthopedics;  Laterality: Right;  . Incision and drainage of wound Right 08/15/2015    Procedure: IRRIGATION AND DEBRIDEMENT WOUND;  Surgeon: Francena HanlyKevin Supple, MD;  Location: [facility] ORS;  Service: Orthopedics;  Laterality: Right;  . Skin split graft Right 08/17/2015    Procedure: I&D RIGHT FOREARM/POSSIBLE SKIN GRAFT;  Surgeon: Dominica SeverinWilliam Gramig, MD;  Location: [facility] OR;  Service: Orthopedics;  Laterality: Right;  . Application of wound vac Right 08/17/2015    Procedure: APPLICATION OF WOUND VAC;  Surgeon: Dominica SeverinWilliam Gramig, MD;  Location: [facility] OR;  Service: Orthopedics;  Laterality: Right;  . I&d extremity Right 08/10/2015    Procedure: IRRIGATION AND DEBRIDEMENT SKIN, SUBCUTANEOS TISSUE AND  MUSCLE, CARPAL TUNNEL RELEASE, MEDIAN NERVE LYSIS WITH NERVE WRAP;  Surgeon: Dominica SeverinWilliam Gramig, MD;  Location: [facility] OR;  Service: Orthopedics;  Laterality: Right;   Family History:  Family  History  Problem Relation Age of Onset  . Cancer Mother     lung  . Anxiety disorder Mother   . Cancer Father     throat  . Diabetes Paternal Grandmother   . Heart disease Neg Hx   . Stroke Neg Hx    Family Psychiatric  History:  See H&P  Social History:  History  Alcohol Use  . Yes    Comment: occasional     History  Drug Use  . Yes  . Special: Marijuana, Cocaine    Comment: last use cocaine 08/08/15, last use MJ 08/08/15    Social History   Social History  . Marital Status: Single    Spouse Name: N/A  . Number of Children: N/A  . Years of Education: N/A   Social History Main Topics  . Smoking status: Current Every Day Smoker -- 0.50 packs/day for 6 years    Types: Cigarettes  . Smokeless tobacco: None  . Alcohol Use: Yes     Comment: occasional  . Drug Use: Yes    Special: Marijuana, Cocaine     Comment: last use cocaine 08/08/15, last use MJ 08/08/15  . Sexual Activity: Not Asked   Other Topics Concern  . 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   No children   Hospital Course: Benjamin [name] is 50 year old male admitted with major depression after a suicide attempt 3 weeks ago. He currently lives in a home, that his mother passed away in. He is in the process of moving to a brand new home, with him and his fiance. He states she is very supportive, and is moving from new [place] in 1 month permanently. He has no children at this time.  Chief Compliant: I am depressed. I cut myself, tried to kill myself. I have never felt that way and I dont know what came over me. I woke up one morning and just kind of snapped. I made a long cut with a razor, and laid there for two days. I wanted some beer so I wrapped my hand up, so blood wouldn't get in my car. By me wrapping my hand up I actually saved my life. My family was calling looking for me because they knew I was going through something's, however I didn't realize until after the impact it would have on my fiance. My mom passed away in 12/125/16 with lung cancer, I was her care giver for three years. She went into hospice and died two days later. My grandmother died less than 30  days after her, I was unable to attend her funeral because I was still wrapping up things with my mom. I have been under a lot of stress and was making impulsive decisions. I was moving fast without thinking about a lot of the things I was doing.   Benjamin [name] was admitted to the [facility] adult unit with complaints of worsening symptoms of depression leading to suicide attempts by self inflicted laceration to his arm area. He cited having hard time dealing with the recent death of his mother as the trigger. He was in need of mood stabilization treatments. After evaluation of his symptoms, Benjamin [name] was medicated & discharged on, Sertraline 50 mg for depression, Hydroxyzine 25 mg for anxiety & Trazodone 50 mg insomnia. He was enrolled & participated in the group counseling sessions being offered & held on this unit. He was counseled &  learned coping skills that should help him cope better & maintain mood stability after discharge. He was resumed on all his pertinent home medications for the other previously existing medical issues presented (Chronic pain). He tolerated his treatment regimen without any adverse effects reported. While his treatment was on going, Benjamin [name]'s improvement was monitored by observation & his daily reports of symptom reduction noted.  His emotional & mental status were monitored by daily self-inventory reports completed by him & the clinical staff.         Benjamin [name] was evaluated daily by the treatment team for mood stability & the need for continued recovery after discharge. His motivation was an integral factor in his recovery & mood stability. He was offered further treatment options upon discharge & will follow up with the outpatient psychiatric services as listed below.  Upon discharge, Benjamin [name] was both mentally & medically stable for discharge. He is currently denying suicidal, homicidal ideation, auditory, visual/tactile hallucinations, delusional thoughts & or paranoia. He left [facility] with all personal  belongings in no apparent distress. Transportation per girlfriend.       Physical Findings: AIMS: Facial and Oral Movements Muscles of Facial Expression: None, normal Lips and Perioral Area: None, normal Jaw: None, normal Tongue: None, normal,Extremity Movements Upper (arms, wrists, hands, fingers): None, normal Lower (legs, knees, ankles, toes): None, normal, Trunk Movements Neck, shoulders, hips: None, normal, Overall Severity Severity of abnormal movements (highest score from questions above): None, normal Incapacitation due to abnormal movements: None, normal Patient's awareness of abnormal movements (rate only patient's report): No Awareness, Dental Status Current problems with teeth and/or dentures?: No Does patient usually wear dentures?: No  CIWA:  CIWA-Ar Total: 1 COWS:  COWS Total Score: 1  Musculoskeletal: Strength & Muscle Tone: within normal limits Gait & Station: normal Patient leans: N/A  Psychiatric Specialty Exam: Physical Exam  Review of Systems  Constitutional: Negative.   HENT: Negative.   Eyes: Negative.   Respiratory: Negative.   Cardiovascular: Negative.   Gastrointestinal: Negative.   Genitourinary: Negative.   Musculoskeletal: Negative.   Skin: Negative.   Neurological: Negative.   Endo/Heme/Allergies: Negative.   Psychiatric/Behavioral: Positive for depression (Stable) and substance abuse. Negative for suicidal ideas (Opioid use disorder), hallucinations and memory loss. The patient has insomnia (Stable). The patient is not nervous/anxious.     Blood pressure 124/86, pulse 63, temperature 98.5 F (36.9 C), temperature source Oral, resp. rate 18, height 6\' 2"  (1.88 m), weight 82.555 kg (182 lb), SpO2 100 %.Body mass index is 23.36 kg/(m^2).  See Md's SRA   Have you used any form of tobacco in the last 30 days? (Cigarettes, Smokeless Tobacco, Cigars, and/or Pipes): Yes  Has this patient used any form of tobacco in the last 30 days? (Cigarettes,  Smokeless Tobacco, Cigars, and/or Pipes): No  Blood Alcohol level:  Lab Results  Component Value Date   ETH <5 08/10/2015    Metabolic Disorder Labs:  Lab Results  Component Value Date   HGBA1C 5.8* 05/24/2015   MPG 120* 05/24/2015   No results found for: PROLACTIN Lab Results  Component Value Date   CHOL 202* 05/24/2015   TRIG 106 05/24/2015   HDL 86 05/24/2015   CHOLHDL 2.3 05/24/2015   VLDL 21 05/24/2015   LDLCALC 95 05/24/2015   LDLCALC 111 11/03/2014    See Psychiatric Specialty Exam and Suicide Risk Assessment completed by Attending Physician prior to discharge.  Discharge destination:  Home  Is patient on multiple antipsychotic therapies at discharge:  No   Has Patient had three or more failed trials of antipsychotic monotherapy by history:  No  Recommended Plan for Multiple Antipsychotic Therapies: NA     Medication List    STOP taking these medications        acetaminophen 325 MG tablet  Commonly known as:  TYLENOL     ascorbic acid 1000 MG tablet  Commonly known as:  VITAMIN C     cephALEXin 500 MG capsule  Commonly known as:  KEFLEX     ciprofloxacin 500 MG tablet  Commonly known as:  CIPRO     docusate sodium 100 MG capsule  Commonly known as:  COLACE     DULoxetine 30 MG capsule  Commonly known as:  CYMBALTA     folic acid 1 MG tablet  Commonly known as:  FOLVITE     methocarbamol 500 MG tablet  Commonly known as:  ROBAXIN     multivitamin with minerals Tabs tablet     oxyCODONE-acetaminophen 10-325 MG tablet  Commonly known as:  PERCOCET     polyethylene glycol packet  Commonly known as:  MIRALAX / GLYCOLAX     thiamine 100 MG tablet      TAKE these medications      Indication   hydrOXYzine 50 MG tablet  Commonly known as:  ATARAX/VISTARIL  Take 1 tablet (50 mg total) by mouth every 6 (six) hours as needed for anxiety.   Indication:  Anxiety Neurosis     oxyCODONE 5 MG immediate release tablet  Commonly known as:  Oxy  IR/ROXICODONE  Take 1 tablet (5 mg total) by mouth every 8 (eight) hours as needed for moderate pain or severe pain.   Indication:  Acute Pain     sertraline 50 MG tablet  Commonly known as:  ZOLOFT  Take 3 tablets (150 mg total) by mouth daily.   Indication:  Major Depressive Disorder     traZODone 50 MG tablet  Commonly known as:  DESYREL  Take 1 tablet (50 mg total) by mouth at bedtime as needed for sleep.   Indication:  Trouble Sleeping       Follow-up Information    Follow up with [facility].   Specialty:  Behavioral Health   Why:  [facility] will call you this week to set up your initial assessment for IOP.   Contact information:   [address] [id number] mc [address] [place] [address] [phone]     Follow-up recommendations: Activity:  As tolerated Diet: As recommended by your primary care doctor. Keep all scheduled follow-up appointments as recommended.  Comments: Patient is instructed prior to discharge to: Take all medications as prescribed by his/her mental healthcare provider. Report any adverse effects and or reactions from the medicines to his/her outpatient provider promptly. Patient has been instructed & cautioned: To not engage in alcohol and or illegal drug use while on prescription medicines. In the event of worsening symptoms, patient is instructed to call the crisis hotline, 911 and or go to the nearest ED for appropriate evaluation and treatment of symptoms. To follow-up with his/her primary care provider for your other medical issues, concerns and or health care needs.   Signed: Sanjuana Kava, NP, PMHNP, FNP-BC 09/08/2015, 3:15 PM  Patient seen, Suicide Assessment Completed.  Disposition Plan Reviewed

## 2015-09-21 NOTE — Discharge Summary (Signed)
  Physician Discharge Summary  Patient ID: Benjamin HaagensenJohn Righter MRN: [id number] DOB/AGE: [birth year] 50 y.o.  Admit date: 08/13/2015 Discharge date: 08/16/2015  Admission Diagnoses: * No surgery found * Past Medical History  Diagnosis Date  . Erectile dysfunction   . Chronic back pain     [facility]    Discharge Diagnoses:  Principal Problem:   MDD (major depressive disorder), single episode, severe , no psychosis (HCC) Active Problems:   Suicide attempt (HCC)   Laceration of right arm with complication   Cocaine use disorder, mild, abuse   Cannabis use disorder, mild, abuse   Opioid use disorder, mild, abuse   Insomnia   Traumatic hematoma of right forearm   Surgeries:  on * No surgery found *    Consultants: Treatment Team:  Francena HanlyKevin Supple, MD  Discharged Condition: Improved  Hospital Course: Benjamin HaagensenJohn Issa is an 50 y.o. male who was admitted 08/13/2015 with a chief complaint of  Chief Complaint  Patient presents with  . Extremity Laceration    5/16 self inflicited Right forearm laceration seen by Dr. Amanda PeaGramig -- presents now with servere painto rt arm  [facility]   , and found to have a diagnosis of * No surgery found *.  They were brought to the operating room on * No surgery found * and underwent .    They were given perioperative antibiotics:  Anti-infectives    Start     Dose/Rate Route Frequency Ordered Stop   08/14/15 1100  ciprofloxacin (CIPRO) tablet 500 mg  Status:  Discontinued     500 mg Oral 2 times daily 08/14/15 1050 08/14/15 1054   08/14/15 1100  [MAR Hold]  ciprofloxacin (CIPRO) tablet 500 mg  Status:  Discontinued     (MAR Hold since 08/15/15 2107)   500 mg Oral 2 times daily 08/14/15 1054 08/16/15 0024    .  They were given sequential compression devices, early ambulation, and Other (comment)   for DVT prophylaxis.  Recent vital signs: No data found. .  Recent laboratory studies: No results found.  Discharge Medications:      Medication List    Notice    You have not been prescribed any medications.      Diagnostic Studies: No results found.  They benefited maximally from their hospital stay and there were no complications.     Disposition: 01-Home or Self Care     Signed: Karen ChafeGRAMIG III,Destiny Trickey M 09/21/2015, 4:45 PM

## 2015-10-07 ENCOUNTER — Ambulatory Visit (INDEPENDENT_AMBULATORY_CARE_PROVIDER_SITE_OTHER): Payer: BLUE CROSS/BLUE SHIELD | Admitting: Medical

## 2015-10-07 ENCOUNTER — Encounter: Payer: Self-pay | Admitting: Medical

## 2015-10-07 VITALS — BP 122/88 | HR 63 | Wt 199.0 lb

## 2015-10-07 DIAGNOSIS — Z87898 Personal history of other specified conditions: Secondary | ICD-10-CM

## 2015-10-07 DIAGNOSIS — F332 Major depressive disorder, recurrent severe without psychotic features: Secondary | ICD-10-CM

## 2015-10-07 DIAGNOSIS — I10 Essential (primary) hypertension: Secondary | ICD-10-CM

## 2015-10-07 DIAGNOSIS — Z720 Tobacco use: Secondary | ICD-10-CM

## 2015-10-07 DIAGNOSIS — Z9189 Other specified personal risk factors, not elsewhere classified: Secondary | ICD-10-CM | POA: Diagnosis not present

## 2015-10-07 DIAGNOSIS — M549 Dorsalgia, unspecified: Secondary | ICD-10-CM

## 2015-10-07 DIAGNOSIS — R7989 Other specified abnormal findings of blood chemistry: Secondary | ICD-10-CM

## 2015-10-07 DIAGNOSIS — R748 Abnormal levels of other serum enzymes: Secondary | ICD-10-CM | POA: Diagnosis not present

## 2015-10-07 DIAGNOSIS — F17200 Nicotine dependence, unspecified, uncomplicated: Secondary | ICD-10-CM

## 2015-10-07 DIAGNOSIS — G8929 Other chronic pain: Secondary | ICD-10-CM

## 2015-10-07 DIAGNOSIS — F1911 Other psychoactive substance abuse, in remission: Secondary | ICD-10-CM

## 2015-10-07 DIAGNOSIS — Z9151 Personal history of suicidal behavior: Secondary | ICD-10-CM

## 2015-10-07 DIAGNOSIS — Z915 Personal history of self-harm: Secondary | ICD-10-CM

## 2015-10-07 NOTE — Progress Notes (Signed)
Subjective: Chief Complaint  Patient presents with  . Advice Only    had to stop working because of his back pain. tried to get public assistance he would need a letter. has [facility]    Here for concern for letter.   He has hx/o chronic back pain in general, but ended up hurting his back in [place] helping a friend move a file cabinet about 2 months ago, but had also re injured his back at work prior to 07/2015 hospitalization.  Since injuring himself, he feels he is unable to work currently.   He applied for public assistance for food stamps but they need letter from doctor stating his concern.    Of note, he was hospitalized 07/2015 for suicide attempt, self injury to arms requiring multiple surgeries.   Since his discharge from hospital, he hasn't seen anyone yet for counseling or psychiatry.  He notes that [facility].  He does report getting low on medications for mental health.  One of the reasons he hasn't been back as he is not wanting to do group therapy sessions.    When he got out of the hospital, took a trip to [place] for 2 weeks to get his thoughts together, to get away.  Since being back he and girlfriend have moved in together in a bigger house.  He left his job since hospitalization, hasn't had the time to get in with mental health.  While in the hospital he decided not to go back to his job.  He does note that prior to the hospitalization, he had injured himself on the job, wasn't able to do his normal job.  Has chronic back pain, decided not to go back to his job.  Has some savings, looked into public assistance.   Needs something stating he can't work due to his back.  Needs letter.   BP was fluctuating in the hospital , but usually normal at home.  Only gets elevated at doctor's office.   He denies any current use of cocaine or other substances.   He does report he is taking his normal medications listed today.  Past  Medical History  Diagnosis Date  . Erectile dysfunction   . Chronic back pain     [facility]   ROS as in subjective   Objective: BP 122/88 mmHg  Pulse 63  Wt 199 lb (90.266 kg)  Gen: wd, wn, nad Psych: pleasant, good eye contact, but seems to be guarded in answering questions   Assessment: Encounter Diagnoses  Name Primary?  . Chronic back pain Yes  . History of suicide attempt   . Creatinine elevation   . Uncontrolled hypertension   . History of substance abuse   . Smoker   . Severe episode of recurrent major depressive disorder, without psychotic features (HCC)     Plan: After discussing his situation, I believe he is not ready to get the help he needs.  I strongly advised he schedule appt with psychiatry and counseling right away.   He is also getting low on his medications.  discussed that there can be delays in getting in with psychiatry so the sooner he schedules the better.    Advised he abstain from substance abuse, advised he not delay this care.   Discussed the crisis nature of his hospital visit for suicide attempt.    discussed prior BP readings,  labs, elevated creatine.  He doesn't seem to acknowledge his recent substance abuse and the potential effect on his BP, kidney function.   He declines medication for hypertension.   He will monitor BP for the next few weeks at home and get me readings in a few weeks.  Advised that I don't make a determination of disability.   I wrote a general letter stating his concerns, but advised he needs to see his back specialist about disability concerns.   Advised that there are probably other jobs less physically demanding he can consider, but needs to get his mental health situation in a better place.   He states he will work on the recommendations, f/u within a month.    Of note, he wouldn't let us refer to counseling today.

## 2015-10-07 NOTE — Patient Instructions (Signed)
RESOURCES in [place], [place]  If you are experiencing a mental health crisis or an emergency, please call 911 or go to the nearest emergency department.  [facility]   [phone] [facility]  [phone] [facility]   [phone]  Suicide Hotline 1-800-Suicide 254-486-2510)  National Suicide Prevention Lifeline (229)304-2194  561-806-6969)  Domestic Violence, Rape/Crisis - [facility] [place] 188-416-6063  [employer] Violence Hotline 1-800-799-SAFE 505 513 9192)  To report Child or Elder Abuse, please call: [facility] Police Department  [phone] [facility]  [phone]  [facility] Crisis Line [phone]  Teen Crisis line 929-072-9785 or ([phone]) [phone]     Psychiatry and Counseling services   Dr. Andee Poles, psychiatry [phone] office [email or web address] [address], [place], [place] [address] Dr. Andee Poles Valinda Hoar, NP Grayland Ormond, NP  Anxiety, Depression, ADHD, OCD, Eating Disorders, Bipolar, other   [facility] [phone] office [email or web address] [address] [place], [place] [address]  Dr. Lynden Ang, psychiatry services  Dr. Bennie Dallas  Depression, Anxiety, Substance Abuse, Couples Issues, Adolescent Issues Oneta Rack, NP Depression, Anxiety, ADHD, Women's issues, Bipolar Disorder, Substance   Abuse Saul Fordyce, NP Depression, Anxiety, Aging, ADHD, Bipolar Disorder, Substance Abuse Manuela Neptune, NP  Mood disturbances, ADHD, children, adolescents, adults Geronimo Running, Therapist Sexual Addiction, Bipolar, Depression, Anxiety, Substance Addiction Shaaron Adler, Therapist Grief and Loss, Anxiety, Depression, Bipolar, Medical Challenges, Life    Transitions Michaelle Copas, Therapist Substance Abuse, Relationships, Clergy Families, Anger and Stress Management, Postpartum Depression,  Pre-Marital Counseling Rochele Raring, Therapist Autsim, Anxiety, Depression, ADHD, Adjustment Disorder, PTSD, Grief and Loss, Divorce, Adoption Concerns   [facility] [phone] office [email or web address] [address] [place], [place], [place] [address]  Franchot Erichsen, MA, clinical psychologist  Cognitive-Behavior Therapy; Mood Disorders; Anxiety Disorders; adult and child ADHD; Family Therapy; Stress Management; personal growth, and Marital Therapy.    Carlus Pavlov Ph.D., clinical psychologist Cognitive-Behavior Therapy; Mood Disorders; Anxiety Disorders; Stress     Management    [facility] [phone] office [address] [place], [place] [address] Crisis services, Family support, in home therapy, treatment for Anxiety, PTSD, Sexual Assault, Substance Abuse, Financial/Credit Counseling, Variety of other services    [facility] [email or web address] [phone] office [address] [place], [place] [address]  Veneda Melter, Ph.D., [facility] Family, Couples, Anxiety, Depression, ADHD, Abuse, Anger Management Sherie Don, M.Ed., LPC Couples, Sexual orientation, Domestic violence, Child Abuse, Major Life Change,  Depression Leandra Kern, [place] Marriage counseling, Women's Issues, Depression, Intimacy, Career Issues Madelaine Etienne, Ph.D.,  LPC PTSD, Addictions, Grief, Anxiety, Sexual Orientation Reather Laurence, [facility] Teen and child depression, anxiety, parenting challenges, Adult depression, self injury, relationship issues. [place], [facility] Addiction, PTSD, Eating Disorders, Depression, Sexual Orientation Daun Peacock, [facility] Eating disorder, Anxiety and Depression, Grief, Divorce, Couples and Family Counseling, Parenting   Dr. Archer Asa, psychiatry [phone] office [address], [place], [place]  [address]  Geriatric psychiatry services   [facility] [phone] office, 24x7 help line [email or web address] [address] [place], [place] [address] Substance Abuse, Depression, Anxiety, Mood Disorders, other Addictions, DWI Assessment/Treatment, Teen Issues, ADHD, Family Therapy Dr. Ezzard Flax, Psychiatry services   Posey Rea, Therapist Initial assessments, Clinical Director, Substance Abuse counseling, DWI and DMV assessments, individual and group counseling Arrie Senate, Therapist Depression, Anxiety, Dysfunctional families, Individual and Couples Counseling, Addiction, Sexual Abuse, Childhood Trauma, Spiritually Based Counseling Robin [facility], Therapist [facility],  Children and Adult Individual Counseling, Depression, Anxiety, Mood Disorders Danice GoltzValerie Phillips, Therapist Ages 5 and up, individual, couple and family therapy, family concerns, ADHD, Mood disorders, Grief, Substance Abuse Weston SettleMelissa Mekita, Therapist Male patients only - Mood disorders, Depression, Anxiety, PTSD, Gried,   Abuse, Relationships   Dr. Milagros Evenerupinder Kaur, psychiatry [phone] office [address], [place], [place] [id number]

## 2015-10-08 ENCOUNTER — Other Ambulatory Visit: Payer: Self-pay | Admitting: Medical

## 2015-10-08 ENCOUNTER — Telehealth: Payer: Self-pay | Admitting: Medical

## 2015-10-08 MED ORDER — SERTRALINE HCL 50 MG PO TABS: 150.0000 mg | ORAL_TABLET | Freq: Every day | ORAL | Status: DC

## 2015-10-08 NOTE — Telephone Encounter (Signed)
Pt wants you to refill his zoloft Rx.  He states behavorial health told him to contact his PCP   [address] [address]

## 2015-10-08 NOTE — Telephone Encounter (Signed)
Sending to courtney

## 2015-10-08 NOTE — Telephone Encounter (Signed)
I'll refill once, but he needs to establish with psychiatry and counseling!

## 2015-10-11 NOTE — Telephone Encounter (Signed)
Pt is aware.  

## 2015-10-11 NOTE — Telephone Encounter (Signed)
Make sure he is aware he has to establish with psychiatry and counseling.

## 2015-11-24 ENCOUNTER — Other Ambulatory Visit: Payer: Self-pay | Admitting: Medical

## 2015-11-25 ENCOUNTER — Telehealth: Payer: Self-pay

## 2015-11-25 NOTE — Telephone Encounter (Signed)
At last visit I asked him to establish with psychiatry.   Given his recent history including suicide attempt, severe depression, I don't think now is the time to wean off this medication.   I again stand by my request for him to see psychiatry.   This is not so much a recommendation as it is a necessary step!

## 2015-11-25 NOTE — Telephone Encounter (Signed)
Pharmacy stated that pt wants to ween off of Zoloft and needs a refill in order to do that and pt wants to start a new depressant

## 2015-11-25 NOTE — Telephone Encounter (Signed)
Is this ok to refill?  

## 2015-11-26 NOTE — Telephone Encounter (Signed)
Left message to call back  

## 2015-11-30 NOTE — Telephone Encounter (Signed)
Pt made aware that we can not make any recommendations on his Zoloft as he is [facility]. Trixie Rude/RLB

## 2015-11-30 NOTE — Telephone Encounter (Signed)
LMTCB

## 2016-02-08 ENCOUNTER — Other Ambulatory Visit: Payer: Self-pay | Admitting: Orthopaedic Surgery

## 2016-02-08 DIAGNOSIS — M5106 Intervertebral disc disorders with myelopathy, lumbar region: Secondary | ICD-10-CM

## 2016-02-19 ENCOUNTER — Other Ambulatory Visit: Payer: Self-pay

## 2016-02-22 ENCOUNTER — Other Ambulatory Visit: Payer: Self-pay

## 2016-02-26 ENCOUNTER — Ambulatory Visit
Admission: RE | Admit: 2016-02-26 | Discharge: 2016-02-26 | Disposition: A | Discharge status: Home / Self Care | Payer: BLUE CROSS/BLUE SHIELD | Source: Ambulatory Visit | Attending: Orthopaedic Surgery | Admitting: Orthopaedic Surgery

## 2016-02-26 DIAGNOSIS — M5106 Intervertebral disc disorders with myelopathy, lumbar region: Secondary | ICD-10-CM

## 2016-03-01 ENCOUNTER — Encounter: Payer: Self-pay | Admitting: Medical

## 2016-03-01 ENCOUNTER — Ambulatory Visit (INDEPENDENT_AMBULATORY_CARE_PROVIDER_SITE_OTHER): Payer: BLUE CROSS/BLUE SHIELD | Admitting: Medical

## 2016-03-01 VITALS — BP 124/70 | HR 70 | Wt 215.6 lb

## 2016-03-01 DIAGNOSIS — Z113 Encounter for screening for infections with a predominantly sexual mode of transmission: Secondary | ICD-10-CM | POA: Diagnosis not present

## 2016-03-01 DIAGNOSIS — D649 Anemia, unspecified: Secondary | ICD-10-CM

## 2016-03-01 DIAGNOSIS — I10 Essential (primary) hypertension: Secondary | ICD-10-CM | POA: Diagnosis not present

## 2016-03-01 DIAGNOSIS — G8929 Other chronic pain: Secondary | ICD-10-CM

## 2016-03-01 DIAGNOSIS — M545 Low back pain: Secondary | ICD-10-CM | POA: Diagnosis not present

## 2016-03-01 DIAGNOSIS — R799 Abnormal finding of blood chemistry, unspecified: Secondary | ICD-10-CM

## 2016-03-01 DIAGNOSIS — R7989 Other specified abnormal findings of blood chemistry: Secondary | ICD-10-CM

## 2016-03-01 DIAGNOSIS — F332 Major depressive disorder, recurrent severe without psychotic features: Secondary | ICD-10-CM

## 2016-03-01 LAB — COMPREHENSIVE METABOLIC PANEL
ALBUMIN: 4.5 g/dL (ref 3.6–5.1)
ALT: 24 U/L (ref 9–46)
AST: 29 U/L (ref 10–40)
Alkaline Phosphatase: 28 U/L — ABNORMAL LOW (ref 40–115)
BILIRUBIN TOTAL: 1.5 mg/dL — AB (ref 0.2–1.2)
BUN: 15 mg/dL (ref 7–25)
CO2: 27 mmol/L (ref 20–31)
CREATININE: 1.47 mg/dL — AB (ref 0.60–1.35)
Calcium: 9.1 mg/dL (ref 8.6–10.3)
Chloride: 104 mmol/L (ref 98–110)
Glucose, Bld: 85 mg/dL (ref 65–99)
Potassium: 4 mmol/L (ref 3.5–5.3)
SODIUM: 141 mmol/L (ref 135–146)
TOTAL PROTEIN: 6.7 g/dL (ref 6.1–8.1)

## 2016-03-01 LAB — CBC WITH DIFFERENTIAL/PLATELET
BASOS ABS: 0 {cells}/uL (ref 0–200)
Basophils Relative: 0 %
Eosinophils Absolute: 94 cells/uL (ref 15–500)
Eosinophils Relative: 2 %
HCT: 39.1 % (ref 38.5–50.0)
Hemoglobin: 12.4 g/dL — ABNORMAL LOW (ref 13.2–17.1)
Lymphocytes Relative: 38 %
Lymphs Abs: 1786 cells/uL (ref 850–3900)
MCH: 23.6 pg — AB (ref 27.0–33.0)
MCHC: 31.7 g/dL — AB (ref 32.0–36.0)
MCV: 74.3 fL — ABNORMAL LOW (ref 80.0–100.0)
Monocytes Absolute: 376 cells/uL (ref 200–950)
Monocytes Relative: 8 %
NEUTROS ABS: 2444 {cells}/uL (ref 1500–7800)
Neutrophils Relative %: 52 %
Platelets: 224 10*3/uL (ref 140–400)
RBC: 5.26 MIL/uL (ref 4.20–5.80)
RDW: 17.4 % — ABNORMAL HIGH (ref 11.0–15.0)
WBC: 4.7 10*3/uL (ref 4.0–10.5)

## 2016-03-01 LAB — IRON AND TIBC
%SAT: 36 % (ref 15–60)
IRON: 125 ug/dL (ref 50–180)
TIBC: 348 ug/dL (ref 250–425)
UIBC: 223 ug/dL (ref 125–400)

## 2016-03-01 NOTE — Progress Notes (Addendum)
Subjective: Chief Complaint  Patient presents with  . back pain    back pain , ladwork    Here for several concerns  Wants updated STD screen.  No symptoms but wants to be sure   Wants to know if he should have a colonoscopy.  No bowel changes, no blood in stool, no family hx/o colon cancer  3-4 months ago started seeing [facility].  Sees psychiatrist Dr. Ezzard Flaxarol Sena and sees therapist there.   On Trintellix 5mg  daily.   He is doing well.  He stil [facility].   He has limited activity given the pain.  applying for disability  Wants recheck labs on creatine that were abnormal earlier in the year.   Past Medical History:  Diagnosis Date  . Chronic back pain    [facility]  . Erectile dysfunction    ROS as in subjective  Objective BP 124/70   Pulse 70   Wt 215 lb 9.6 oz (97.8 kg)   SpO2 98%   BMI 27.68 kg/m    General appearance: alert, no distress, WD/WN,  HEENT: normocephalic, sclerae anicteric, TMs pearly, nares patent, no discharge or erythema, pharynx normal Oral cavity: MMM, no lesions Neck: supple, no lymphadenopathy, no thyromegaly, no masses Heart: RRR, normal S1, S2, no murmurs Lungs: CTA bilaterally, no wheezes, rhonchi, or rales Abdomen: +bs, soft, non tender, non distended, no masses, no hepatomegaly, no splenomegaly, no bruits Pulses: 2+ symmetric, upper and lower extremities, normal cap refill    Adult ECG Report  Indication: hypertension  Rate: 57bpm  Rhythm: sinus bradycardia  QRS Axis: 60 degrees  PR Interval: 172 ms  QRS Duration: 110ms  QTc: 428ms  Conduction Disturbances: none  Other Abnormalities: none  Patient's cardiac risk factors are: hypertension and male gender.  EKG comparison: none  Narrative Interpretation: sinus bradycardia     Assessment: Encounter Diagnoses  Name Primary?  . Abnormal serum creatinine level Yes  . Chronic bilateral low back pain, with  sciatica presence unspecified   . Severe episode of recurrent major depressive disorder, without psychotic features (HCC)   . Anemia, unspecified type   . Essential hypertension   . Screen for STD (sexually transmitted disease)     Plan: Repeat labs, avoid excessive NSAID or other nephrotoxic medications.   Asked him to do stool cards x 3.  STD screen today.    [facility].  Glad he is doing better with mood.      Of note he has hx/o suicide attempt earlier this year, hx/o substance abuse.  Benjamin [name] was seen today for back pain.  Diagnoses and all orders for this visit:  Abnormal serum creatinine level -     Comprehensive metabolic panel  Chronic bilateral low back pain, with sciatica presence unspecified  Severe episode of recurrent major depressive disorder, without psychotic features (HCC)  Anemia, unspecified type -     CBC with Differential/Platelet -     Iron and TIBC  Essential hypertension -     EKG 12-Lead  Screen for STD (sexually transmitted disease) -     HIV antibody -     RPR -     GC/Chlamydia Probe Amp

## 2016-03-02 ENCOUNTER — Other Ambulatory Visit: Payer: Self-pay | Admitting: Medical

## 2016-03-02 LAB — GC/CHLAMYDIA PROBE AMP
CT Probe RNA: NOT DETECTED
GC PROBE AMP APTIMA: NOT DETECTED

## 2016-03-02 LAB — HIV ANTIBODY (ROUTINE TESTING W REFLEX): HIV 1&2 Ab, 4th Generation: NONREACTIVE

## 2016-03-02 LAB — RPR

## 2016-03-02 MED ORDER — FERROUS GLUCONATE 324 (38 FE) MG PO TABS: 324.0000 mg | ORAL_TABLET | Freq: Every day | ORAL | 0 refills | Status: DC

## 2016-03-14 ENCOUNTER — Other Ambulatory Visit (INDEPENDENT_AMBULATORY_CARE_PROVIDER_SITE_OTHER): Payer: BLUE CROSS/BLUE SHIELD

## 2016-03-14 DIAGNOSIS — Z1211 Encounter for screening for malignant neoplasm of colon: Secondary | ICD-10-CM

## 2016-03-14 LAB — HEMOCCULT GUIAC POC 1CARD (OFFICE)
Card #2 Fecal Occult Blod, POC: NEGATIVE
Card #3 Fecal Occult Blood, POC: NEGATIVE
FECAL OCCULT BLD: NEGATIVE

## 2016-03-15 ENCOUNTER — Other Ambulatory Visit: Payer: Self-pay | Admitting: Medical

## 2016-03-15 DIAGNOSIS — Z13220 Encounter for screening for lipoid disorders: Secondary | ICD-10-CM

## 2016-05-25 ENCOUNTER — Ambulatory Visit (INDEPENDENT_AMBULATORY_CARE_PROVIDER_SITE_OTHER): Payer: BLUE CROSS/BLUE SHIELD | Admitting: Family Medicine

## 2016-05-25 VITALS — BP 144/80 | HR 65 | Temp 97.9°F

## 2016-05-25 DIAGNOSIS — R1110 Vomiting, unspecified: Secondary | ICD-10-CM

## 2016-05-25 DIAGNOSIS — R109 Unspecified abdominal pain: Secondary | ICD-10-CM

## 2016-05-25 DIAGNOSIS — R197 Diarrhea, unspecified: Secondary | ICD-10-CM

## 2016-05-25 MED ORDER — ONDANSETRON HCL 4 MG PO TABS: 4.0000 mg | ORAL_TABLET | Freq: Three times a day (TID) | ORAL | 0 refills | Status: DC | PRN

## 2016-05-25 MED ORDER — KETOROLAC TROMETHAMINE 60 MG/2ML IM SOLN
60.0000 mg | Freq: Once | INTRAMUSCULAR | Status: AC
  Administered 2016-05-25: 60 mg via INTRAMUSCULAR

## 2016-05-25 MED ORDER — PROMETHAZINE HCL 25 MG/ML IJ SOLN
50.0000 mg | Freq: Once | INTRAMUSCULAR | Status: AC
  Administered 2016-05-25: 50 mg via INTRAMUSCULAR

## 2016-05-25 NOTE — Progress Notes (Signed)
   Subjective:    Patient ID: Benjamin [name], male    DOB: [birth year], 51 y.o.   MRN: [id number]  HPI He has been having difficulty with vomiting, diarrhea and abdominal pain that started this morning. He apparently ate a steak Sam which last night. He has a previous history of difficulty with eating steak causing exactly the same problems. No fever, chills. No rash or swelling noted.   Review of Systems     Objective:   Physical Exam Alert and in distress. Abdominal exam shows decreased bowel sounds without masses or tenderness. Cardiac and lung exam normal.       Assessment & Plan:  Abdominal pain, vomiting, and diarrhea - Plan: ketorolac (TORADOL) injection 60 mg, ondansetron (ZOFRAN) 4 MG tablet, promethazine (PHENERGAN) injection 50 mg I don't think this is nonallergic reaction but definitely an adverse reaction from steak. No symptoms suggestive of angioedema.

## 2016-05-30 ENCOUNTER — Telehealth: Payer: Self-pay | Admitting: Family Medicine

## 2016-05-30 NOTE — Telephone Encounter (Signed)
Pts fiance called and states that [facility] and they want him to take Mobic everyday when he has flares up with his back,  but the spine drs  want to make sure that it is safe for him to take. States that we took labs here and that is why they want to make sure it is ok before he takes this medicine they can not see our records in our system, please advise pt can be reached at [phone] with any questions .

## 2016-05-31 NOTE — Telephone Encounter (Signed)
His creatinine /kidney marker has been mildly elevated, and this is something we have been monitoring.    His last visit I had recommended he use anti-inflammatory (Ibuprofen, Aleve, Motrin, Mobic, etc) occasionally, not daily and not high dose.  So no I wouldn't recommend Mobic every day, more as a back up, and preferably the 7.5mg  dose.  In other words, I would use other therapies/medications/etc to help control pain.  I do recommend recheck visit and repeat labs at this time.  Last time we checked was 02/2016 for kidney marker.     Of note, I had recommend prior consult with kidney doctor given history of elevated creatinine and protein in urine.

## 2016-05-31 NOTE — Telephone Encounter (Signed)
Called pt and gave him Shane's instructions. Pt will call back to make an appointment to f/u and repeat kidney marker labs.

## 2016-05-31 NOTE — Telephone Encounter (Signed)
Pt called back stating that he is in pain and still waiting for an answer on if her can take the meds that were given to him. He needs an answer ASAP.

## 2016-08-09 ENCOUNTER — Encounter: Payer: Self-pay | Admitting: Family Medicine

## 2016-08-09 ENCOUNTER — Ambulatory Visit (INDEPENDENT_AMBULATORY_CARE_PROVIDER_SITE_OTHER): Payer: BLUE CROSS/BLUE SHIELD | Admitting: Family Medicine

## 2016-08-09 VITALS — BP 130/90 | HR 74 | Wt 222.8 lb

## 2016-08-09 DIAGNOSIS — R030 Elevated blood-pressure reading, without diagnosis of hypertension: Secondary | ICD-10-CM | POA: Diagnosis not present

## 2016-08-09 NOTE — Patient Instructions (Addendum)
Get a BP cuff and start checking your blood pressure twice daily and keep a record of your readings. Bring in your BP cuff and readings to see Kristian CoveyShane Tysinger, your PCP, in 2-3 weeks.  Stay well hydrated. Cut back on salt.    DASH Eating Plan DASH stands for "Dietary Approaches to Stop Hypertension." The DASH eating plan is a healthy eating plan that has been shown to reduce high blood pressure (hypertension). It may also reduce your risk for type 2 diabetes, heart disease, and stroke. The DASH eating plan may also help with weight loss. What are tips for following this plan? General guidelines   Avoid eating more than 2,300 mg (milligrams) of salt (sodium) a day. If you have hypertension, you may need to reduce your sodium intake to 1,500 mg a day.  Limit alcohol intake to no more than 1 drink a day for nonpregnant women and 2 drinks a day for men. One drink equals 12 oz of beer, 5 oz of wine, or 1 oz of hard liquor.  Work with your health care provider to maintain a healthy body weight or to lose weight. Ask what an ideal weight is for you.  Get at least 30 minutes of exercise that causes your heart to beat faster (aerobic exercise) most days of the week. Activities may include walking, swimming, or biking.  Work with your health care provider or diet and nutrition specialist (dietitian) to adjust your eating plan to your individual calorie needs. Reading food labels   Check food labels for the amount of sodium per serving. Choose foods with less than 5 percent of the Daily Value of sodium. Generally, foods with less than 300 mg of sodium per serving fit into this eating plan.  To find whole grains, look for the word "whole" as the first word in the ingredient list. Shopping   Buy products labeled as "low-sodium" or "no salt added."  Buy fresh foods. Avoid canned foods and premade or frozen meals. Cooking   Avoid adding salt when cooking. Use salt-free seasonings or herbs instead of  table salt or sea salt. Check with your health care provider or pharmacist before using salt substitutes.  Do not fry foods. Cook foods using healthy methods such as baking, boiling, grilling, and broiling instead.  Cook with heart-healthy oils, such as olive, canola, soybean, or sunflower oil. Meal planning    Eat a balanced diet that includes:  5 or more servings of fruits and vegetables each day. At each meal, try to fill half of your plate with fruits and vegetables.  Up to 6-8 servings of whole grains each day.  Less than 6 oz of lean meat, poultry, or fish each day. A 3-oz serving of meat is about the same size as a deck of cards. One egg equals 1 oz.  2 servings of low-fat dairy each day.  A serving of nuts, seeds, or beans 5 times each week.  Heart-healthy fats. Healthy fats called Omega-3 fatty acids are found in foods such as flaxseeds and coldwater fish, like sardines, salmon, and mackerel.  Limit how much you eat of the following:  Canned or prepackaged foods.  Food that is high in trans fat, such as fried foods.  Food that is high in saturated fat, such as fatty meat.  Sweets, desserts, sugary drinks, and other foods with added sugar.  Full-fat dairy products.  Do not salt foods before eating.  Try to eat at least 2 vegetarian meals each week.  Eat more home-cooked food and less restaurant, buffet, and fast food.  When eating at a restaurant, ask that your food be prepared with less salt or no salt, if possible. What foods are recommended? The items listed may not be a complete list. Talk with your dietitian about what dietary choices are best for you. Grains  Whole-grain or whole-wheat bread. Whole-grain or whole-wheat pasta. Brown rice. [place]ena Morrow. Bulgur. Whole-grain and low-sodium cereals. Pita bread. Low-fat, low-sodium crackers. Whole-wheat flour tortillas. Vegetables  Fresh or frozen vegetables (raw, steamed, roasted, or grilled). Low-sodium or  reduced-sodium tomato and vegetable juice. Low-sodium or reduced-sodium tomato sauce and tomato paste. Low-sodium or reduced-sodium canned vegetables. Fruits  All fresh, dried, or frozen fruit. Canned fruit in natural juice (without added sugar). Meat and other protein foods  Skinless chicken or [place]. Ground chicken or [place]. Pork with fat trimmed off. Fish and seafood. Egg whites. Dried beans, peas, or lentils. Unsalted nuts, nut butters, and seeds. Unsalted canned beans. Lean cuts of beef with fat trimmed off. Low-sodium, lean deli meat. Dairy  Low-fat (1%) or fat-free (skim) milk. Fat-free, low-fat, or reduced-fat cheeses. Nonfat, low-sodium ricotta or cottage cheese. Low-fat or nonfat yogurt. Low-fat, low-sodium cheese. Fats and oils  Soft margarine without trans fats. Vegetable oil. Low-fat, reduced-fat, or light mayonnaise and salad dressings (reduced-sodium). Canola, safflower, olive, soybean, and sunflower oils. Avocado. Seasoning and other foods  Herbs. Spices. Seasoning mixes without salt. Unsalted popcorn and pretzels. Fat-free sweets. What foods are not recommended? The items listed may not be a complete list. Talk with your dietitian about what dietary choices are best for you. Grains  Baked goods made with fat, such as croissants, muffins, or some breads. Dry pasta or rice meal packs. Vegetables  Creamed or fried vegetables. Vegetables in a cheese sauce. Regular canned vegetables (not low-sodium or reduced-sodium). Regular canned tomato sauce and paste (not low-sodium or reduced-sodium). Regular tomato and vegetable juice (not low-sodium or reduced-sodium). Angie Fava. Olives. Fruits  Canned fruit in a light or heavy syrup. Fried fruit. Fruit in cream or butter sauce. Meat and other protein foods  Fatty cuts of meat. Ribs. Fried meat. Berniece Salines. Sausage. Bologna and other processed lunch meats. Salami. Fatback. Hotdogs. Bratwurst. Salted nuts and seeds. Canned beans with added salt.  Canned or smoked fish. Whole eggs or egg yolks. Chicken or [place] with skin. Dairy  Whole or 2% milk, cream, and half-and-half. Whole or full-fat cream cheese. Whole-fat or sweetened yogurt. Full-fat cheese. Nondairy creamers. Whipped toppings. Processed cheese and cheese spreads. Fats and oils  Butter. Stick margarine. Lard. Shortening. Ghee. Bacon fat. Tropical oils, such as coconut, palm kernel, or palm oil. Seasoning and other foods  Salted popcorn and pretzels. Onion salt, garlic salt, seasoned salt, table salt, and sea salt. Worcestershire sauce. Tartar sauce. Barbecue sauce. Teriyaki sauce. Soy sauce, including reduced-sodium. Steak sauce. Canned and packaged gravies. Fish sauce. Oyster sauce. Cocktail sauce. Horseradish that you find on the shelf. Ketchup. Mustard. Meat flavorings and tenderizers. Bouillon cubes. Hot sauce and Tabasco sauce. Premade or packaged marinades. Premade or packaged taco seasonings. Relishes. Regular salad dressings. Where to find more information:  National Heart, Lung, and [place]: https://wilson-eaton.com/  American Heart Association: www.heart.org Summary  The DASH eating plan is a healthy eating plan that has been shown to reduce high blood pressure (hypertension). It may also reduce your risk for type 2 diabetes, heart disease, and stroke.  With the DASH eating plan, you should limit salt (sodium) intake to 2,300 mg  a day. If you have hypertension, you may need to reduce your sodium intake to 1,500 mg a day.  When on the DASH eating plan, aim to eat more fresh fruits and vegetables, whole grains, lean proteins, low-fat dairy, and heart-healthy fats.  Work with your health care provider or diet and nutrition specialist (dietitian) to adjust your eating plan to your individual calorie needs. This information is not intended to replace advice given to you by your health care provider. Make sure you discuss any questions you have with your health care  provider. Document Released: 03/02/2011 Document Revised: 03/06/2016 Document Reviewed: 03/06/2016 Elsevier Interactive Patient Education  2017 [employer].

## 2016-08-09 NOTE — Progress Notes (Signed)
   Subjective:    Patient ID: Benjamin [name], male    DOB: [birth year], 51 y.o.   MRN: [id number]  HPI Chief Complaint  Patient presents with  . bp issues    bp issue- running high. 157/91 and then went to [facility] and checked it 146/84   He is here with concerns regarding elevated BP and not feeling well this morning. States he felt tired when he woke up this morning but he left his house and continued feeling tired so he went home and rested and felt better. His girlfriend states his BP was 157/91 this morning and then later was 146/84.   Denies fever, chills, headache, dizziness, blurred or double vision, chest pain, palpitations, shortness of breath, abdominal pain, N/V/D, LE edema.   Denies history of HTN but this diagnosis is in his chart. He is not aware of having HTN.  His girlfriend states he has been eating more salt than usual and not drinking water.  States he drinks a lot of coffee 2-3 large cups during the day.   Does not smoke or drink alcohol. States he does do some exercises. No longer using drugs.   Reviewed allergies, medications, past medical,  and social history.   Review of Systems Pertinent positives and negatives in the history of present illness.     Objective:   Physical Exam  Constitutional: He is oriented to person, place, and time. He appears well-developed and well-nourished. No distress.  Eyes: Conjunctivae are normal. Pupils are equal, round, and reactive to light.  Neck: Normal range of motion. Neck supple. No thyromegaly present.  Cardiovascular: Normal rate, regular rhythm, normal heart sounds and intact distal pulses.  Exam reveals no gallop.   No murmur heard. Pulmonary/Chest: Effort normal and breath sounds normal.  Musculoskeletal: Normal range of motion. He exhibits no edema.  Lymphadenopathy:    He has no cervical adenopathy.  Neurological: He is alert and oriented to person, place, and time. He has normal reflexes. No cranial nerve  deficit. Coordination and gait normal.  Skin: Skin is warm and dry. No pallor.  Psychiatric: He has a normal mood and affect. His behavior is normal. Thought content normal.   BP 130/90   Pulse 74   Wt 222 lb 12.8 oz (101.1 kg)   BMI 28.61 kg/m       Assessment & Plan:  Elevated blood pressure reading  Discussed that his exam in unremarkable and his BP is not at goal currently but not at a concerning level either. Counseled on healthy lifestyle modifications to control BP. He will get a BP cuff and check his BP twice daily for the next 2-3 weeks and return to see his PCP. He was advised to bring in readings and cuff.  DASH diet provided and discussed.

## 2016-08-31 ENCOUNTER — Ambulatory Visit (INDEPENDENT_AMBULATORY_CARE_PROVIDER_SITE_OTHER): Payer: BLUE CROSS/BLUE SHIELD | Admitting: Medical

## 2016-08-31 ENCOUNTER — Encounter: Payer: Self-pay | Admitting: Medical

## 2016-08-31 VITALS — BP 132/90 | HR 61 | Wt 218.8 lb

## 2016-08-31 DIAGNOSIS — Z87891 Personal history of nicotine dependence: Secondary | ICD-10-CM

## 2016-08-31 DIAGNOSIS — R7989 Other specified abnormal findings of blood chemistry: Secondary | ICD-10-CM

## 2016-08-31 DIAGNOSIS — R799 Abnormal finding of blood chemistry, unspecified: Secondary | ICD-10-CM | POA: Diagnosis not present

## 2016-08-31 DIAGNOSIS — I10 Essential (primary) hypertension: Secondary | ICD-10-CM | POA: Diagnosis not present

## 2016-08-31 LAB — CBC
HEMATOCRIT: 40.8 % (ref 38.5–50.0)
HEMOGLOBIN: 13.4 g/dL (ref 13.2–17.1)
MCH: 23.8 pg — ABNORMAL LOW (ref 27.0–33.0)
MCHC: 32.8 g/dL (ref 32.0–36.0)
MCV: 72.3 fL — ABNORMAL LOW (ref 80.0–100.0)
Platelets: 220 10*3/uL (ref 140–400)
RBC: 5.64 MIL/uL (ref 4.20–5.80)
RDW: 15.6 % — ABNORMAL HIGH (ref 11.0–15.0)
WBC: 3.6 10*3/uL — AB (ref 4.0–10.5)

## 2016-08-31 LAB — COMPREHENSIVE METABOLIC PANEL
ALK PHOS: 34 U/L — AB (ref 40–115)
ALT: 35 U/L (ref 9–46)
AST: 47 U/L — ABNORMAL HIGH (ref 10–40)
Albumin: 4.7 g/dL (ref 3.6–5.1)
BUN: 14 mg/dL (ref 7–25)
CO2: 27 mmol/L (ref 20–31)
Calcium: 9.5 mg/dL (ref 8.6–10.3)
Chloride: 104 mmol/L (ref 98–110)
Creat: 1.58 mg/dL — ABNORMAL HIGH (ref 0.60–1.35)
Glucose, Bld: 92 mg/dL (ref 65–99)
POTASSIUM: 4.6 mmol/L (ref 3.5–5.3)
Sodium: 138 mmol/L (ref 135–146)
TOTAL PROTEIN: 7 g/dL (ref 6.1–8.1)
Total Bilirubin: 1.8 mg/dL — ABNORMAL HIGH (ref 0.2–1.2)

## 2016-08-31 LAB — LIPID PANEL
CHOL/HDL RATIO: 3.9 ratio (ref <5.0)
CHOLESTEROL: 217 mg/dL — AB (ref <200)
HDL: 55 mg/dL (ref >40)
LDL Cholesterol: 129 mg/dL — ABNORMAL HIGH (ref <100)
Triglycerides: 167 mg/dL — ABNORMAL HIGH (ref <150)
VLDL: 33 mg/dL — ABNORMAL HIGH (ref <30)

## 2016-08-31 NOTE — Progress Notes (Signed)
Subjective: Chief Complaint  Patient presents with  . Follow-up    follow up  3 weeks    Here for f/u on BP.  Accompanied by his fiance.  Saw Vickie 3 weeks ago.  He was advised to monitor BPs.   140-150s SBP.    DBPs in the 80-90s.  Mostly in the 90s.   Exercise lately has been doing more stretching and strength training to help with low back.  Not doing much cardio type exercise.  Since last visit has been better at diet.  Has cut out fried food, still drinks several cups of coffee daily. Eating more fruits and vegetables.  No chest pain,no swelling, no palpations.  Former smoker.   Denies alcohol or drug use.  No prior medication for BP.  No other aggravating or relieving factors. No other complaint.  Past Medical History:  Diagnosis Date  . Chronic back pain    managed [facility]  . Erectile dysfunction     Current Outpatient Prescriptions on File Prior to Visit  Medication Sig Dispense Refill  . HYDROcodone-acetaminophen (NORCO) 10-325 MG tablet Take 1 tablet by mouth every 6 (six) hours as needed.    . vortioxetine HBr (TRINTELLIX) 5 MG TABS Take by mouth.    . prazosin (MINIPRESS) 1 MG capsule Take 1 mg by mouth at bedtime.     No current facility-administered medications on file prior to visit.    ROS as in subjective  Objective: BP 132/90   Pulse 61   Wt 218 lb 12.8 oz (99.2 kg)   SpO2 98%   BMI 28.09 kg/m   Wt Readings from Last 3 Encounters:  08/31/16 218 lb 12.8 oz (99.2 kg)  08/09/16 222 lb 12.8 oz (101.1 kg)  03/01/16 215 lb 9.6 oz (97.8 kg)   General appearance: alert, no distress, WD/WN,  Neck: supple, no lymphadenopathy, no thyromegaly, no masses, no bruits Heart: RRR, normal S1, S2, no murmurs Lungs: CTA bilaterally, no wheezes, rhonchi, or rales Abdomen: +bs, soft, non tender, non distended, no masses, no hepatomegaly, no splenomegaly Extremities: no edema, no cyanosis, no clubbing Pulses: 2+ symmetric, upper and lower extremities,  normal cap refill Neurological: alert, oriented x 3, CN2-12 intact, strength normal upper extremities and lower extremities, sensation normal throughout, DTRs 2+ throughout, no cerebellar signs, gait normal Psychiatric: normal affect, behavior normal, pleasant    Assessment: Encounter Diagnoses  Name Primary?  . Essential hypertension Yes  . Abnormal serum creatinine level   . Former smoker      Plan: discussed his elevated readings, prior diagnosis although he was in denial about this in the past.    Pending labs we will begin medication tomorrow, likely losartan.  Discussed mediterranean diet, getting more cardio exercise, limiting salt and caffeine.   Advised he stop eating hot dogs.  Discussed goals for BP, try to lose a little more weight.  Discussed prior creatinine levels.  F/u pending labs.  Reviewed 02/2016 EKG.  Cap was seen today for follow-up.  Diagnoses and all orders for this visit:  Essential hypertension -     Comprehensive metabolic panel -     Lipid panel -     Hemoglobin A1c -     CBC  Abnormal serum creatinine level -     Comprehensive metabolic panel -     Lipid panel -     Hemoglobin A1c -     CBC  Former smoker -     Comprehensive metabolic  panel -     Lipid panel -     Hemoglobin A1c -     CBC

## 2016-08-31 NOTE — Patient Instructions (Signed)
Recommendations  Continue to monitor your blood pressures  Goal BP is 130/80 or less  We will begin a blood pressure medication tomorrow when I get your labs back  Limit salt, cut back on caffeine  Get more cardio exercise  Cut out the hot dogs!    Hypertension Hypertension, commonly called high blood pressure, is when the force of blood pumping through the arteries is too strong. The arteries are the blood vessels that carry blood from the heart throughout the body. Hypertension forces the heart to work harder to pump blood and may cause arteries to become narrow or stiff. Having untreated or uncontrolled hypertension can cause heart attacks, strokes, kidney disease, and other problems. A blood pressure reading consists of a higher number over a lower number. Ideally, your blood pressure should be below 120/80. The first ("top") number is called the systolic pressure. It is a measure of the pressure in your arteries as your heart beats. The second ("bottom") number is called the diastolic pressure. It is a measure of the pressure in your arteries as the heart relaxes. What are the causes? The cause of this condition is not known. What increases the risk? Some risk factors for high blood pressure are under your control. Others are not. Factors you can change  Smoking.  Having type 2 diabetes mellitus, high cholesterol, or both.  Not getting enough exercise or physical activity.  Being overweight.  Having too much fat, sugar, calories, or salt (sodium) in your diet.  Drinking too much alcohol. Factors that are difficult or impossible to change  Having chronic kidney disease.  Having a family history of high blood pressure.  Age. Risk increases with age.  Race. You may be at higher risk if you are African-American.  Gender. Men are at higher risk than women before age 49. After age 49, women are at higher risk than men.  Having obstructive sleep apnea.  Stress. What  are the signs or symptoms? Extremely high blood pressure (hypertensive crisis) may cause:  Headache.  Anxiety.  Shortness of breath.  Nosebleed.  Nausea and vomiting.  Severe chest pain.  Jerky movements you cannot control (seizures).  How is this diagnosed? This condition is diagnosed by measuring your blood pressure while you are seated, with your arm resting on a surface. The cuff of the blood pressure monitor will be placed directly against the skin of your upper arm at the level of your heart. It should be measured at least twice using the same arm. Certain conditions can cause a difference in blood pressure between your right and left arms. Certain factors can cause blood pressure readings to be lower or higher than normal (elevated) for a short period of time:  When your blood pressure is higher when you are in a health care provider's office than when you are at home, this is called white coat hypertension. Most people with this condition do not need medicines.  When your blood pressure is higher at home than when you are in a health care provider's office, this is called masked hypertension. Most people with this condition may need medicines to control blood pressure.  If you have a high blood pressure reading during one visit or you have normal blood pressure with other risk factors:  You may be asked to return on a different day to have your blood pressure checked again.  You may be asked to monitor your blood pressure at home for 1 week or longer.  If you  are diagnosed with hypertension, you may have other blood or imaging tests to help your health care provider understand your overall risk for other conditions. How is this treated? This condition is treated by making healthy lifestyle changes, such as eating healthy foods, exercising more, and reducing your alcohol intake. Your health care provider may prescribe medicine if lifestyle changes are not enough to get your  blood pressure under control, and if:  Your systolic blood pressure is above 130.  Your diastolic blood pressure is above 80.  Your personal target blood pressure may vary depending on your medical conditions, your age, and other factors. Follow these instructions at home: Eating and drinking  Eat a diet that is high in fiber and potassium, and low in sodium, added sugar, and fat. An example eating plan is called the DASH (Dietary Approaches to Stop Hypertension) diet. To eat this way: ? Eat plenty of fresh fruits and vegetables. Try to fill half of your plate at each meal with fruits and vegetables. ? Eat whole grains, such as whole wheat pasta, brown rice, or whole grain bread. Fill about one quarter of your plate with whole grains. ? Eat or drink low-fat dairy products, such as skim milk or low-fat yogurt. ? Avoid fatty cuts of meat, processed or cured meats, and poultry with skin. Fill about one quarter of your plate with lean proteins, such as fish, chicken without skin, beans, eggs, and tofu. ? Avoid premade and processed foods. These tend to be higher in sodium, added sugar, and fat.  Reduce your daily sodium intake. Most people with hypertension should eat less than 1,500 mg of sodium a day.  Limit alcohol intake to no more than 1 drink a day for nonpregnant women and 2 drinks a day for men. One drink equals 12 oz of beer, 5 oz of wine, or 1 oz of hard liquor. Lifestyle  Work with your health care provider to maintain a healthy body weight or to lose weight. Ask what an ideal weight is for you.  Get at least 30 minutes of exercise that causes your heart to beat faster (aerobic exercise) most days of the week. Activities may include walking, swimming, or biking.  Include exercise to strengthen your muscles (resistance exercise), such as pilates or lifting weights, as part of your weekly exercise routine. Try to do these types of exercises for 30 minutes at least 3 days a week.  Do  not use any products that contain nicotine or tobacco, such as cigarettes and e-cigarettes. If you need help quitting, ask your health care provider.  Monitor your blood pressure at home as told by your health care provider.  Keep all follow-up visits as told by your health care provider. This is important. Medicines  Take over-the-counter and prescription medicines only as told by your health care provider. Follow directions carefully. Blood pressure medicines must be taken as prescribed.  Do not skip doses of blood pressure medicine. Doing this puts you at risk for problems and can make the medicine less effective.  Ask your health care provider about side effects or reactions to medicines that you should watch for. Contact a health care provider if:  You think you are having a reaction to a medicine you are taking.  You have headaches that keep coming back (recurring).  You feel dizzy.  You have swelling in your ankles.  You have trouble with your vision. Get help right away if:  You develop a severe headache or confusion.  You have unusual weakness or numbness.  You feel faint.  You have severe pain in your chest or abdomen.  You vomit repeatedly.  You have trouble breathing. Summary  Hypertension is when the force of blood pumping through your arteries is too strong. If this condition is not controlled, it may put you at risk for serious complications.  Your personal target blood pressure may vary depending on your medical conditions, your age, and other factors. For most people, a normal blood pressure is less than 120/80.  Hypertension is treated with lifestyle changes, medicines, or a combination of both. Lifestyle changes include weight loss, eating a healthy, low-sodium diet, exercising more, and limiting alcohol. This information is not intended to replace advice given to you by your health care provider. Make sure you discuss any questions you have with your  health care provider. Document Released: 03/13/2005 Document Revised: 02/09/2016 Document Reviewed: 02/09/2016 Elsevier Interactive Patient Education  [employer].

## 2016-09-01 ENCOUNTER — Other Ambulatory Visit: Payer: Self-pay | Admitting: Medical

## 2016-09-01 LAB — HEMOGLOBIN A1C
Hgb A1c MFr Bld: 5.6 % (ref <5.7)
MEAN PLASMA GLUCOSE: 114 mg/dL

## 2016-09-01 MED ORDER — AMLODIPINE BESYLATE 10 MG PO TABS: 10.0000 mg | ORAL_TABLET | Freq: Every day | ORAL | 2 refills | Status: DC

## 2016-09-01 NOTE — Progress Notes (Signed)
am

## 2016-09-12 ENCOUNTER — Telehealth: Payer: Self-pay

## 2016-09-12 NOTE — Telephone Encounter (Signed)
[facility] per pt signed request. Benjamin [name]/[facility]

## 2016-09-19 ENCOUNTER — Ambulatory Visit (INDEPENDENT_AMBULATORY_CARE_PROVIDER_SITE_OTHER): Payer: BLUE CROSS/BLUE SHIELD | Admitting: Medical

## 2016-09-19 ENCOUNTER — Encounter: Payer: Self-pay | Admitting: Medical

## 2016-09-19 VITALS — BP 140/90 | HR 74 | Wt 220.8 lb

## 2016-09-19 DIAGNOSIS — R7989 Other specified abnormal findings of blood chemistry: Secondary | ICD-10-CM | POA: Diagnosis not present

## 2016-09-19 DIAGNOSIS — I10 Essential (primary) hypertension: Secondary | ICD-10-CM

## 2016-09-19 DIAGNOSIS — N183 Chronic kidney disease, stage 3 unspecified: Secondary | ICD-10-CM | POA: Insufficient documentation

## 2016-09-19 DIAGNOSIS — D649 Anemia, unspecified: Secondary | ICD-10-CM

## 2016-09-19 DIAGNOSIS — N189 Chronic kidney disease, unspecified: Secondary | ICD-10-CM | POA: Diagnosis not present

## 2016-09-19 DIAGNOSIS — R945 Abnormal results of liver function studies: Secondary | ICD-10-CM | POA: Insufficient documentation

## 2016-09-19 DIAGNOSIS — Z720 Tobacco use: Secondary | ICD-10-CM | POA: Diagnosis not present

## 2016-09-19 MED ORDER — NICOTINE 21 MG/24HR TD PT24: 21.0000 mg | MEDICATED_PATCH | Freq: Every day | TRANSDERMAL | 0 refills | Status: DC

## 2016-09-19 NOTE — Progress Notes (Signed)
Subjective: Chief Complaint  Patient presents with  . discuss meds and quiet smoking    discuss meds and quiet smoking    Here for f/u on BP.  Accompanied by his fiance.    I saw him 08/31/16 regarding poor diet, BP, elevated creatinine.  Labs from that visit showed cholesterol too high, creatinine still elevated but stable mild elevation of AST.  We had him begin amlodipine for hypertension.    He also reportedly started making diet changes last visit, particularly cutting out hot dogs, fried food, and eating cleaner.    Having a hard time stopping tobacco.  Will stop for a few days, then pick it back up.  He stopped for 9 years in the past.    No other aggravating or relieving factors. No other complaint.  Past Medical History:  Diagnosis Date  . Chronic back pain    managed by [facility]  . Erectile dysfunction     Current Outpatient Prescriptions on File Prior to Visit  Medication Sig Dispense Refill  . amLODipine (NORVASC) 10 MG tablet Take 1 tablet (10 mg total) by mouth daily. 30 tablet 2  . HYDROcodone-acetaminophen (NORCO) 10-325 MG tablet Take 1 tablet by mouth every 6 (six) hours as needed.    . vortioxetine HBr (TRINTELLIX) 5 MG TABS Take by mouth.    . prazosin (MINIPRESS) 1 MG capsule Take 1 mg by mouth at bedtime.     No current facility-administered medications on file prior to visit.    ROS as in subjective    Objective: BP 110/80   Pulse 74   Wt 220 lb 12.8 oz (100.2 kg)   SpO2 97%   BMI 28.35 kg/m   Wt Readings from Last 3 Encounters:  09/19/16 220 lb 12.8 oz (100.2 kg)  08/31/16 218 lb 12.8 oz (99.2 kg)  08/09/16 222 lb 12.8 oz (101.1 kg)   BP Readings from Last 3 Encounters:  09/19/16 110/80  08/31/16 132/90  08/09/16 130/90   General appearance: alert, no distress, WD/WN,  Neck: supple, no lymphadenopathy, no thyromegaly, no masses, no bruits Heart: RRR, normal S1, S2, no murmurs Lungs: CTA bilaterally, no wheezes, rhonchi,  or rales Abdomen: +bs, soft, non tender, non distended, no masses, no hepatomegaly, no splenomegaly Extremities: no edema, no cyanosis, no clubbing Pulses: 2+ symmetric, upper and lower extremities, normal cap refill Neurological: alert, oriented x 3, CN2-12 intact, strength normal upper extremities and lower extremities, sensation normal throughout, DTRs 2+ throughout, no cerebellar signs, gait normal Psychiatric: normal affect, behavior normal, pleasant     Assessment: Encounter Diagnoses  Name Primary?  . Essential hypertension Yes  . Chronic kidney disease, unspecified CKD stage   . Elevated LFTs   . Anemia, unspecified type   . Tobacco use       Plan: HTN - Compared his cuff reading today 145/96 with our reading today.  His cuff seems to be accurate.  C/t working on [employer], routine exercise  CKD - he will be having his first nephrology appt soon.  I suspect CKD related to uncontrolled BP and prior substance abuse  Elevate LFTs - recheck in 80mo, and if worse, get further eval, labs, [place]  Anemia - improved on recent lab  Tobacco use - begin trial of Nicotine patch daily, counseling through [facility] and his personal counselor.  Call/recheck in 31mo on smoking cessation  Arpan was seen today for discuss meds and quiet smoking.  Diagnoses and all orders for this  visit:  Essential hypertension  Chronic kidney disease, unspecified CKD stage  Elevated LFTs  Anemia, unspecified type  Tobacco use

## 2016-10-04 ENCOUNTER — Ambulatory Visit: Payer: Self-pay | Admitting: Medical

## 2016-10-04 ENCOUNTER — Telehealth: Payer: Self-pay

## 2016-10-04 NOTE — Telephone Encounter (Signed)
D

## 2016-10-04 NOTE — Telephone Encounter (Signed)

## 2016-10-06 NOTE — Telephone Encounter (Signed)
No show letter not sent. Pt was advised on 09/19/2016 to follow up in 3 months. This appt not cancelled

## 2016-10-23 ENCOUNTER — Ambulatory Visit (INDEPENDENT_AMBULATORY_CARE_PROVIDER_SITE_OTHER): Payer: BLUE CROSS/BLUE SHIELD | Admitting: Medical

## 2016-10-23 ENCOUNTER — Encounter: Payer: Self-pay | Admitting: Medical

## 2016-10-23 VITALS — BP 134/70 | HR 58 | Wt 219.8 lb

## 2016-10-23 DIAGNOSIS — I10 Essential (primary) hypertension: Secondary | ICD-10-CM | POA: Diagnosis not present

## 2016-10-23 DIAGNOSIS — N189 Chronic kidney disease, unspecified: Secondary | ICD-10-CM | POA: Diagnosis not present

## 2016-10-23 DIAGNOSIS — N529 Male erectile dysfunction, unspecified: Secondary | ICD-10-CM | POA: Diagnosis not present

## 2016-10-23 MED ORDER — VIAGRA 100 MG PO TABS: ORAL_TABLET | ORAL | 2 refills | Status: DC

## 2016-10-23 NOTE — Progress Notes (Signed)
Subjective:  Benjamin HaagensenJohn [name] is a 51 y.o. male who presents for BP f/u.     Sees [facility] tomorrow for initial consult.   He came is as his BPs were fluctuating, some good some bad.   He is compliant with amlodipine 10mg  and improved diet and exercise since June.   Thinks the Amlodipine is causing worse ED issues though.  Amlodipine was started in June.   He has Prazosin listed in chart, but uses this 1/2 tablet occasionally QHS for sleep.     He is still having ED issues, wants refill on Viagra which has worked well prior.  No other aggravating or relieving factors.    No other c/o.  The following portions of the patient's history were reviewed and updated as appropriate: allergies, current medications, past family history, past medical history, past social history, past surgical history and problem list.  ROS Otherwise as in subjective above  Past Medical History:  Diagnosis Date  . Chronic back pain    managed [facility]  . Erectile dysfunction   . History of substance abuse   . Hx of suicide attempt    Current Outpatient Prescriptions on File Prior to Visit  Medication Sig Dispense Refill  . amLODipine (NORVASC) 10 MG tablet Take 1 tablet (10 mg total) by mouth daily. 30 tablet 2  . HYDROcodone-acetaminophen (NORCO) 10-325 MG tablet Take 1 tablet by mouth every 6 (six) hours as needed.    . nicotine (NICODERM CQ - DOSED IN MG/24 HOURS) 21 mg/24hr patch Place 1 patch (21 mg total) onto the skin daily. 28 patch 0  . prazosin (MINIPRESS) 1 MG capsule Take 1 mg by mouth at bedtime.     No current facility-administered medications on file prior to visit.      Objective:Exam BP 134/70   Pulse (!) 58   Wt 219 lb 12.8 oz (99.7 kg)   SpO2 98%   BMI 28.22 kg/m   BP Readings from Last 3 Encounters:  10/23/16 134/70  09/19/16 140/90  08/31/16 132/90   Wt Readings from Last 3 Encounters:  10/23/16 219 lb 12.8 oz (99.7 kg)  09/19/16 220 lb 12.8 oz  (100.2 kg)  08/31/16 218 lb 12.8 oz (99.2 kg)    General appearance: alert, no distress, WD/WN Heart: RRR, normal S1, S2, no murmurs Lungs: CTA bilaterally, no wheezes, rhonchi, or rales Abdomen: +bs, soft, non tender, non distended, no masses, no hepatomegaly, no splenomegaly Pulses: 2+ radial pulses, 2+ pedal pulses, normal cap refill Ext: no edema   Assessment: Encounter Diagnoses  Name Primary?  . Essential hypertension Yes  . Chronic kidney disease, unspecified CKD stage   . Erectile dysfunction, unspecified erectile dysfunction type       Plan: HTN - for now, c/t Amlodipine, healthy diet, and f/u with kidney doctor tomorrow  CKD - see nephrology tomorrow  ED - refilled Viagra, discussed risks/benefits of Viagra and similar medication.     Follow up: with kidney doctor tomorrow for consult on CKD, likely related to prior substance abuse.

## 2016-10-26 ENCOUNTER — Other Ambulatory Visit: Payer: Self-pay | Admitting: Nephrology

## 2016-10-26 ENCOUNTER — Telehealth: Payer: Self-pay | Admitting: Family Medicine

## 2016-10-26 DIAGNOSIS — N183 Chronic kidney disease, stage 3 unspecified: Secondary | ICD-10-CM

## 2016-10-26 NOTE — Telephone Encounter (Signed)
Pt called and asked that we send his Viagra to [facility] at [id number]

## 2016-10-27 ENCOUNTER — Other Ambulatory Visit: Payer: Self-pay

## 2016-10-27 MED ORDER — VIAGRA 100 MG PO TABS: ORAL_TABLET | ORAL | 2 refills | Status: DC

## 2016-10-27 NOTE — Telephone Encounter (Signed)
Can he have a refill on this. 

## 2016-10-27 NOTE — Telephone Encounter (Signed)
done

## 2016-10-27 NOTE — Telephone Encounter (Signed)
Yes pls refill

## 2016-11-14 ENCOUNTER — Ambulatory Visit
Admission: RE | Admit: 2016-11-14 | Discharge: 2016-11-14 | Disposition: A | Discharge status: Home / Self Care | Payer: BLUE CROSS/BLUE SHIELD | Source: Ambulatory Visit | Attending: Nephrology | Admitting: Nephrology

## 2016-11-14 DIAGNOSIS — N183 Chronic kidney disease, stage 3 unspecified: Secondary | ICD-10-CM

## 2016-11-21 NOTE — Telephone Encounter (Signed)
This encounter was created in error - please disregard.

## 2016-12-07 ENCOUNTER — Other Ambulatory Visit: Payer: Self-pay | Admitting: Medical

## 2017-01-19 ENCOUNTER — Ambulatory Visit: Payer: BLUE CROSS/BLUE SHIELD | Admitting: Medical

## 2017-03-06 ENCOUNTER — Ambulatory Visit: Payer: BLUE CROSS/BLUE SHIELD | Admitting: Medical

## 2017-03-13 ENCOUNTER — Ambulatory Visit (INDEPENDENT_AMBULATORY_CARE_PROVIDER_SITE_OTHER): Payer: BLUE CROSS/BLUE SHIELD | Admitting: Medical

## 2017-03-13 ENCOUNTER — Encounter: Payer: Self-pay | Admitting: Medical

## 2017-03-13 VITALS — BP 126/80 | HR 60 | Temp 97.9°F | Wt 211.4 lb

## 2017-03-13 DIAGNOSIS — F17200 Nicotine dependence, unspecified, uncomplicated: Secondary | ICD-10-CM | POA: Diagnosis not present

## 2017-03-13 DIAGNOSIS — R05 Cough: Secondary | ICD-10-CM | POA: Diagnosis not present

## 2017-03-13 DIAGNOSIS — R053 Chronic cough: Secondary | ICD-10-CM

## 2017-03-13 MED ORDER — ALBUTEROL SULFATE HFA 108 (90 BASE) MCG/ACT IN AERS: 2.0000 | INHALATION_SPRAY | Freq: Four times a day (QID) | RESPIRATORY_TRACT | 0 refills | Status: DC | PRN

## 2017-03-13 MED ORDER — BENZONATATE 200 MG PO CAPS: 200.0000 mg | ORAL_CAPSULE | Freq: Three times a day (TID) | ORAL | 0 refills | Status: DC | PRN

## 2017-03-13 NOTE — Progress Notes (Signed)
Subjective: Chief Complaint  Patient presents with  . coughing    coughing at night, no fever    Here for cough, mostly at night for months. Here with girlfriend.  Coughing a lot at night during sleep as well.   Sometimes in the day but worse at night.  Started back smoking 3 months.  Denies belching, bad taste in mouth, reflux.  Doesn't eat a lot of spicy, acidic or tomato based foods.   Not drinking.  No hx/o asthma, not coughing up blood.  He has hx/o 35 years of tobacco use typically around 1/2 ppd.  Denies allergy or GERD problems, no post nasal drainage.    No hx/o asthma or inhaler use.  no other aggravating or relieving factors. No other complaint.  Past Medical History:  Diagnosis Date  . Chronic back pain    managed [facility]  . Erectile dysfunction   . History of substance abuse   . Hx of suicide attempt    Current Outpatient Medications on File Prior to Visit  Medication Sig Dispense Refill  . amLODipine (NORVASC) 10 MG tablet TAKE 1 TABLET(10 MG) BY MOUTH DAILY 30 tablet 0  . HYDROcodone-acetaminophen (NORCO) 10-325 MG tablet Take 1 tablet by mouth every 6 (six) hours as needed.    . prazosin (MINIPRESS) 1 MG capsule Take 1 mg by mouth at bedtime.     No current facility-administered medications on file prior to visit.    ROS as in subjective    Objective: BP 126/80   Pulse 60   Temp 97.9 F (36.6 C)   Wt 211 lb 6.4 oz (95.9 kg)   SpO2 97%   BMI 27.14 kg/m   Wt Readings from Last 3 Encounters:  03/13/17 211 lb 6.4 oz (95.9 kg)  10/23/16 219 lb 12.8 oz (99.7 kg)  09/19/16 220 lb 12.8 oz (100.2 kg)   General appearance: alert, no distress, WD/WN,  HEENT: normocephalic, sclerae anicteric, TMs pearly, nares patent, no discharge or erythema, pharynx normal Oral cavity: MMM, no lesions Neck: supple, no lymphadenopathy, no thyromegaly, no masses, no JVD Heart: RRR, normal S1, S2, no murmurs Lungs: CTA bilaterally, no wheezes, rhonchi, or  rales Abdomen: +bs, soft, non tender, non distended, no masses, no hepatomegaly, no splenomegaly Pulses: 2+ symmetric, upper and lower extremities, normal cap refill Ext: no edema Psych: seems more flat in demeanor today, somewhat guarded    Assessment: Encounter Diagnoses  Name Primary?  . Chronic cough Yes  . Smoker      Plan: discussed possible causes of cough, differential, discussed his abnormal PFT today.  Go for chest xray  Begin trial of inhaler, tessalon Perles prn.    advised he quit tobacco  Benjamin [name] was seen today for coughing.  Diagnoses and all orders for this visit:  Chronic cough -     DG Chest 2 View; Future -     Spirometry with Graph  Smoker -     DG Chest 2 View; Future  Other orders -     albuterol (PROVENTIL HFA;VENTOLIN HFA) 108 (90 Base) MCG/ACT inhaler; Inhale 2 puffs into the lungs every 6 (six) hours as needed for wheezing or shortness of breath. -     benzonatate (TESSALON) 200 MG capsule; Take 1 capsule (200 mg total) by mouth 3 (three) times daily as needed for cough.

## 2017-03-14 ENCOUNTER — Ambulatory Visit
Admission: RE | Admit: 2017-03-14 | Discharge: 2017-03-14 | Disposition: A | Discharge status: Home / Self Care | Payer: BLUE CROSS/BLUE SHIELD | Source: Ambulatory Visit | Attending: Medical | Admitting: Medical

## 2017-03-14 DIAGNOSIS — F17200 Nicotine dependence, unspecified, uncomplicated: Secondary | ICD-10-CM

## 2017-03-14 DIAGNOSIS — R05 Cough: Secondary | ICD-10-CM

## 2017-03-14 DIAGNOSIS — R053 Chronic cough: Secondary | ICD-10-CM

## 2017-06-13 IMAGING — MR MR LUMBAR SPINE W/O CM
5 series · 42 of 48 positions shown · non-contrast
Comparison: Lumbar MRI 09/30/2014

CLINICAL DATA: Chronic low back pain for 15 years, recent
worsening. Occasional extension of pain into the legs. No history of
malignancy, bowel or bladder changes or back surgery.

EXAM:
MRI LUMBAR SPINE WITHOUT CONTRAST
TECHNIQUE: Multiplanar, multisequence MR imaging of the lumbar spine was
performed. No intravenous contrast was administered.

[Series 3: T2 · sagittal · 4.0mm · 0.94mm/px · 6 of 13 slices shown (1 of 2)]
[im 1/13]
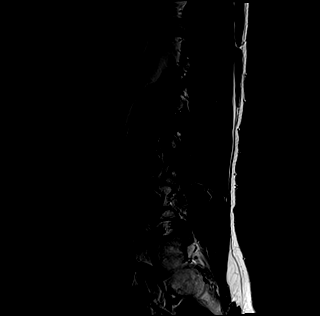
[im 3/13]
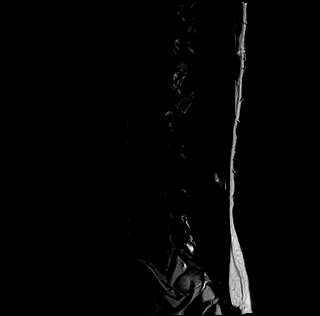
[im 5/13]
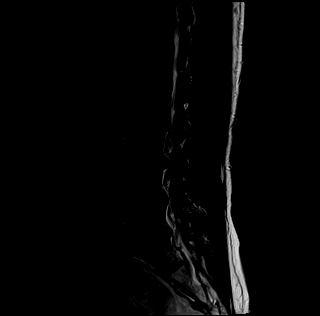
[im 8/13]
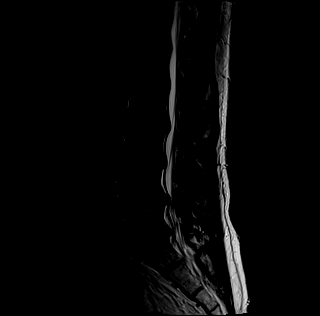
[im 10/13]
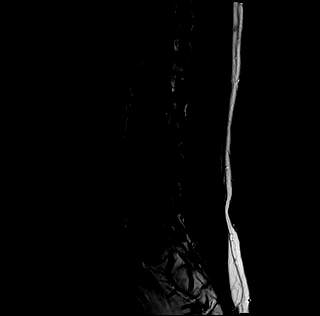
[im 13/13]
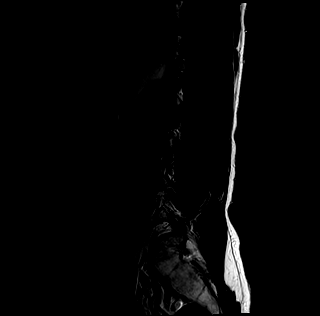

[Series 4: T1 · sagittal · 4.0mm · 0.94mm/px · 6 of 13 slices shown (1 of 2)]
[im 1/13]
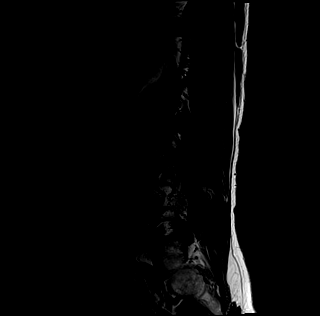
[im 3/13]
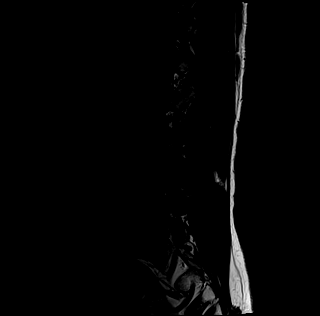
[im 5/13]
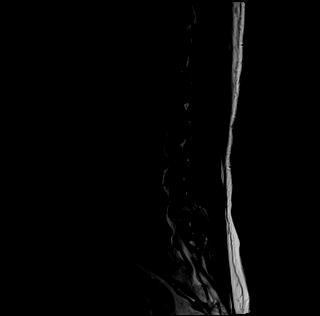
[im 8/13]
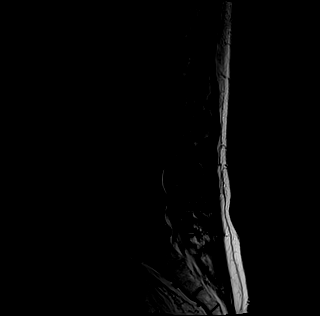
[im 10/13]
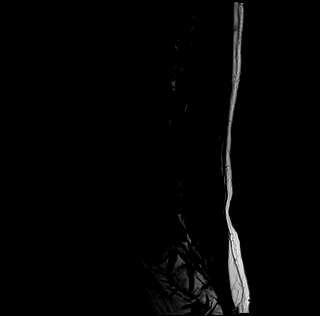
[im 13/13]
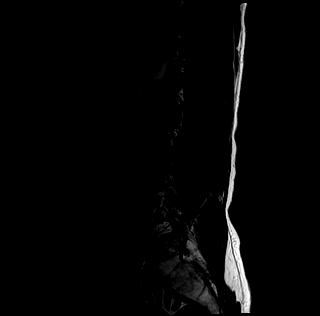

[Series 5: tirm sag · sagittal · 4.0mm · 0.59mm/px · 6 of 13 slices shown]
[im 1/13]
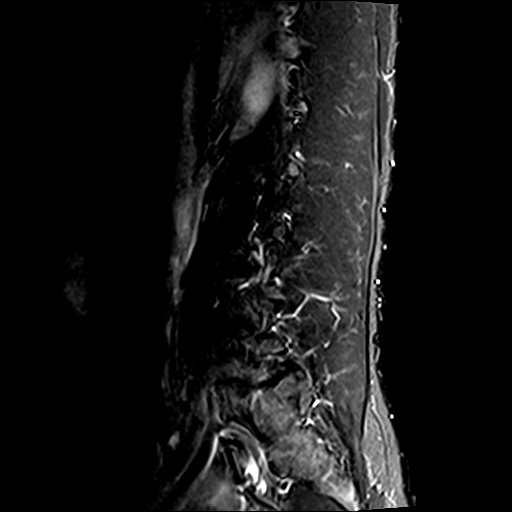
[im 3/13]
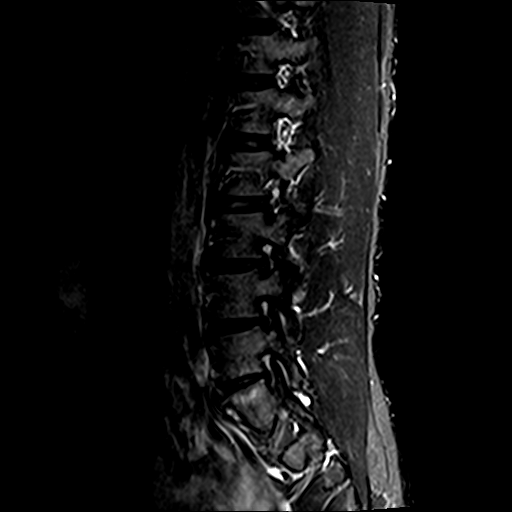
[im 5/13]
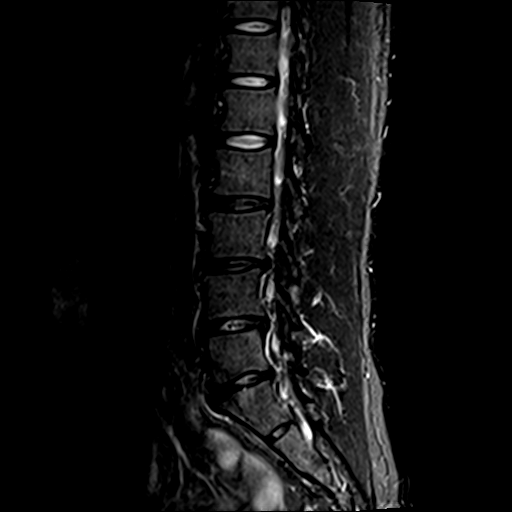
[im 8/13]
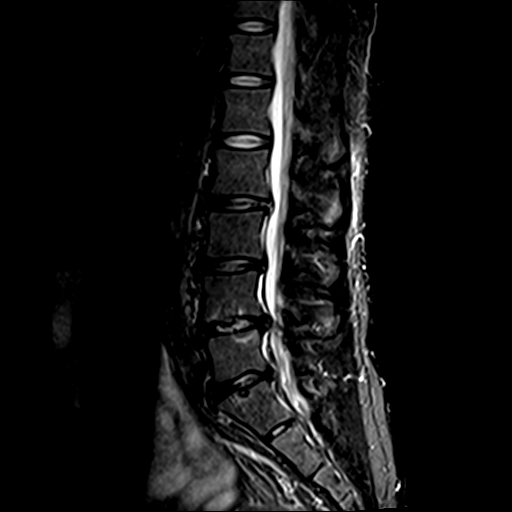
[im 10/13]
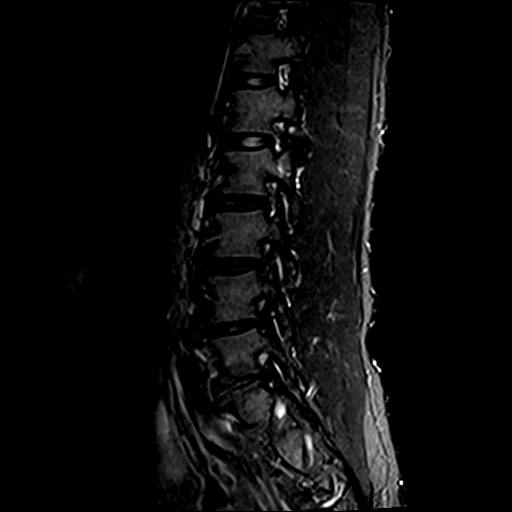
[im 13/13]
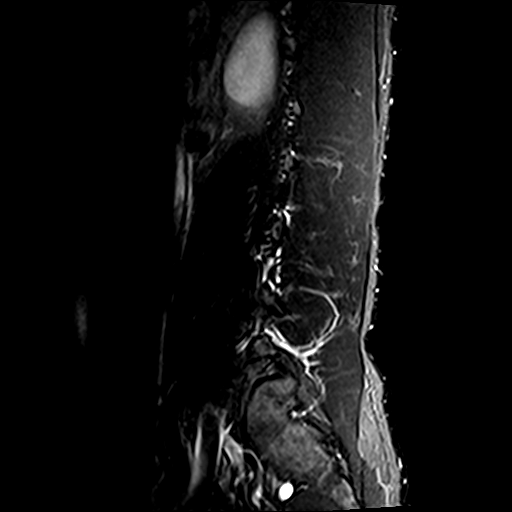

[Series 6: T1 · axial · 4.0mm · 0.78mm/px · z∈[-73,+100]mm · 9 of 32 slices shown (2 of 2)]
[im 1/32]
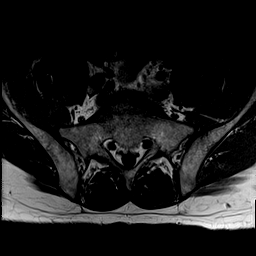
[im 5/32]
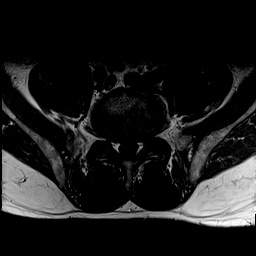
[im 9/32]
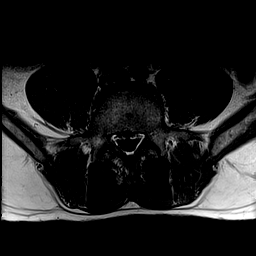
[im 14/32]
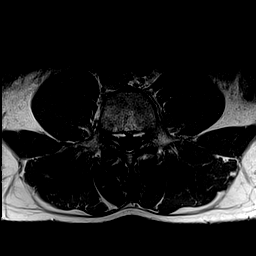
[im 16/32]
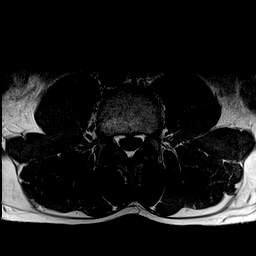
[im 18/32]
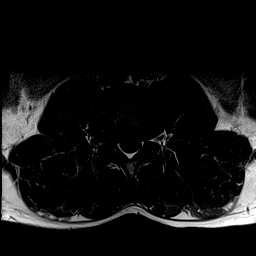
[im 23/32]
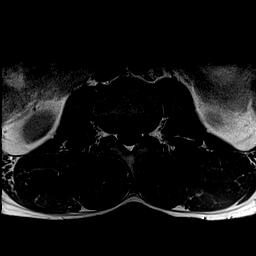
[im 27/32]
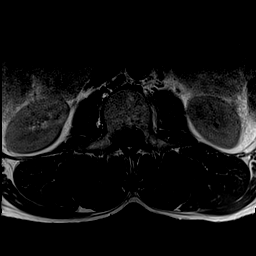
[im 32/32]
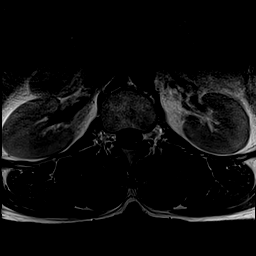

[Series 7: T2 · axial · 4.0mm · 0.78mm/px · z∈[-73,+100]mm · 15 of 32 slices shown (2 of 2)]
[im 1/32]
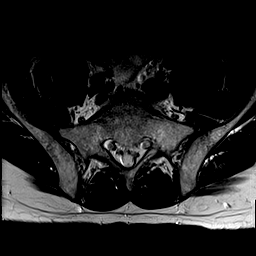
[im 3/32]
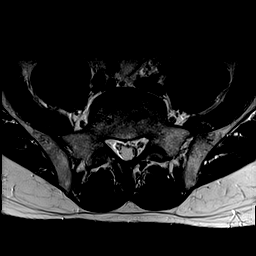
[im 5/32]
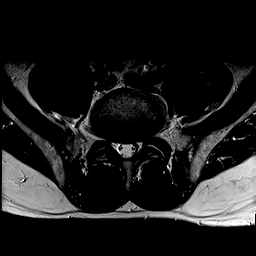
[im 7/32]
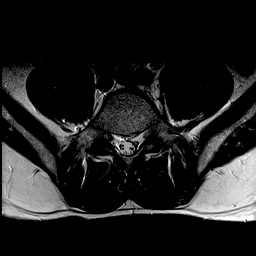
[im 9/32]
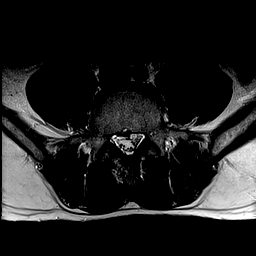
[im 12/32]
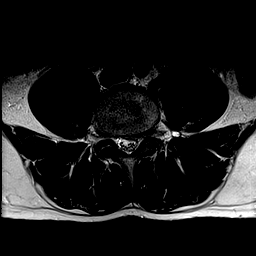
[im 14/32]
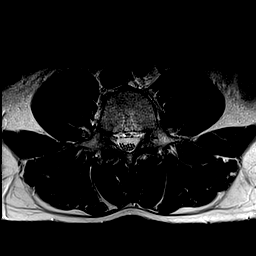
[im 16/32]
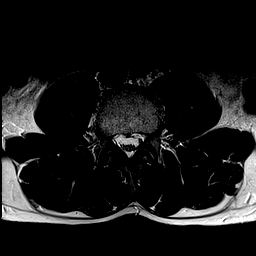
[im 18/32]
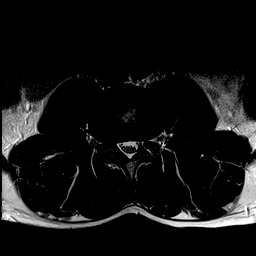
[im 20/32]
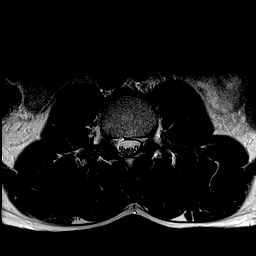
[im 23/32]
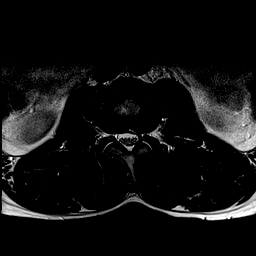
[im 25/32]
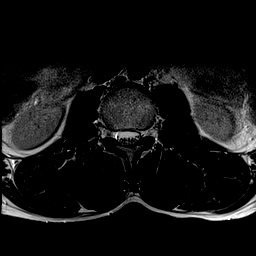
[im 27/32]
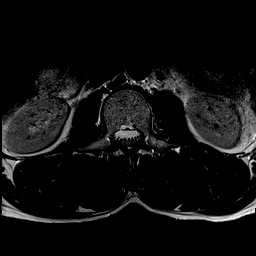
[im 29/32]
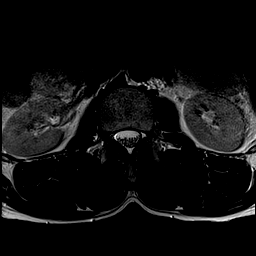
[im 32/32]
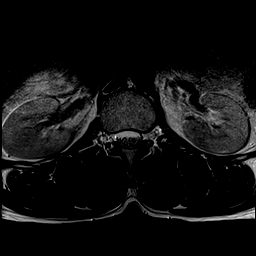

[42 of 48 positions shown; findings below may reference images not displayed]

FINDINGS: Segmentation: Conventional anatomy assumed, with the last open disc
space designated L5-S1.

Alignment:  Normal.

Vertebrae: No worrisome osseous lesion, acute fracture or pars
defect. There are progressive endplate degenerative changes at L4-5
and L5-S1. The lumbar pedicles are short on a congenital basis. The
visualized sacroiliac joints appear unremarkable.

Conus medullaris: Extends to the L1-2 level and appears normal.

Paraspinal and other soft tissues: No significant paraspinal
findings.

Disc levels:

No significant disc space findings from T11-12 through L1-2.

L2-3: Stable loss of disc height with annular disc bulging and a
small left paracentral disc protrusion. No significant spinal
stenosis or nerve root encroachment.

L3-4: Stable loss of disc height with annular disc bulging eccentric
to the right. The previously demonstrated right foraminal disc
protrusion has partially involuted, although there is some residual
right foraminal narrowing. There is facet and ligamentous
hypertrophy contributing to borderline spinal stenosis.

L4-5: Progressive disc degeneration with annular disc bulging and
endplate osteophytes. There is a broad-based disc protrusion within
the left subarticular zone, exerting mass effect on the thecal sac
and potentially encroaching on the left L5 nerve root. There is mild
facet and ligamentous hypertrophy. The foramina are sufficiently
patent.

L5-S1: Mildly progressive disc and endplate degeneration with mild
bilateral facet hypertrophy. No residual focal disc protrusion
identified. There is mild inferior foraminal narrowing bilaterally.
IMPRESSION: 1. Interval partial involution of right foraminal disc protrusion at
L3-4. There is mild residual right foraminal narrowing.
2. Progressive disc and endplate degeneration at L4-5 with new disc
protrusion in the left subarticular zone, causing possible left L5
nerve root encroachment.
3. Mildly progressive disc and endplate degeneration at L5-S1
without increased mass effect.

## 2017-10-08 ENCOUNTER — Ambulatory Visit: Payer: BLUE CROSS/BLUE SHIELD | Admitting: Medical

## 2017-10-08 ENCOUNTER — Encounter: Payer: Self-pay | Admitting: Medical

## 2017-10-08 VITALS — BP 130/90 | HR 60 | Temp 97.9°F | Resp 16 | Ht 72.5 in | Wt 213.4 lb

## 2017-10-08 DIAGNOSIS — N183 Chronic kidney disease, stage 3 unspecified: Secondary | ICD-10-CM

## 2017-10-08 DIAGNOSIS — Z0000 Encounter for general adult medical examination without abnormal findings: Secondary | ICD-10-CM

## 2017-10-08 DIAGNOSIS — F329 Major depressive disorder, single episode, unspecified: Secondary | ICD-10-CM

## 2017-10-08 DIAGNOSIS — G8929 Other chronic pain: Secondary | ICD-10-CM | POA: Diagnosis not present

## 2017-10-08 DIAGNOSIS — N529 Male erectile dysfunction, unspecified: Secondary | ICD-10-CM | POA: Diagnosis not present

## 2017-10-08 DIAGNOSIS — F17200 Nicotine dependence, unspecified, uncomplicated: Secondary | ICD-10-CM

## 2017-10-08 DIAGNOSIS — Z1211 Encounter for screening for malignant neoplasm of colon: Secondary | ICD-10-CM

## 2017-10-08 DIAGNOSIS — M545 Low back pain: Secondary | ICD-10-CM | POA: Diagnosis not present

## 2017-10-08 DIAGNOSIS — I10 Essential (primary) hypertension: Secondary | ICD-10-CM

## 2017-10-08 DIAGNOSIS — Z125 Encounter for screening for malignant neoplasm of prostate: Secondary | ICD-10-CM

## 2017-10-08 DIAGNOSIS — F32A Depression, unspecified: Secondary | ICD-10-CM

## 2017-10-08 NOTE — Addendum Note (Signed)
Addended by: Derinda LateLAMPART, Okie Jansson G on: 10/08/2017 03:55 PM   Modules accepted: Orders

## 2017-10-08 NOTE — Patient Instructions (Signed)
Thanks for trusting us with your health care and for coming in for a physical today.  Below are some general recommendations I have for you:  Yearly screenings See your eye doctor yearly for routine vision care. See your dentist yearly for routine dental care including hygiene visits twice yearly. See me here yearly for a routine physical and preventative care visit   Specific Concerns today:  . Shingles vaccine:  I recommend you have a shingles vaccine to help prevent shingles or herpes zoster outbreak.   Please call your insurer to inquire about coverage for the Shingrix vaccine given in 2 doses.   Some insurers cover this vaccine after age 50, some cover this after age 50.  If your insurer covers this, then call to schedule appointment to have this vaccine here. . We are referring you to your first colonoscopy   Please follow up yearly for a physical.   Preventative Care for Adults - Male      MAINTAIN REGULAR HEALTH EXAMS:  A routine yearly physical is a good way to check in with your primary care provider about your health and preventive screening. It is also an opportunity to share updates about your health and any concerns you have, and receive a thorough all-over exam.   Most health insurance companies pay for at least some preventative services.  Check with your health plan for specific coverages.  WHAT PREVENTATIVE SERVICES DO WOMEN NEED?  Adult men should have their weight and blood pressure checked regularly.   Men age 50 and older should have their cholesterol levels checked regularly.  Beginning at age 50 and continuing to age 50, men should be screened for colorectal cancer.  Certain people may need continued testing until age 50.  Updating vaccinations is part of preventative care.  Vaccinations help protect against diseases such as the flu.  Osteoporosis is a disease in which the bones lose minerals and strength as we age. Men ages 8665 and over should discuss this  with their caregivers  Lab tests are generally done as part of preventative care to screen for anemia and blood disorders, to screen for problems with the kidneys and liver, to screen for bladder problems, to check blood sugar, and to check your cholesterol level.  Preventative services generally include counseling about diet, exercise, avoiding tobacco, drugs, excessive alcohol consumption, and sexually transmitted infections.    GENERAL RECOMMENDATIONS FOR GOOD HEALTH:  Healthy diet:  Eat a variety of foods, including fruit, vegetables, animal or vegetable protein, such as meat, fish, chicken, and eggs, or beans, lentils, tofu, and grains, such as rice.  Drink plenty of water daily.  Decrease saturated fat in the diet, avoid lots of red meat, processed foods, sweets, fast foods, and fried foods.  Exercise:  Aerobic exercise helps maintain good heart health. At least 30-40 minutes of moderate-intensity exercise is recommended. For example, a brisk walk that increases your heart rate and breathing. This should be done on most days of the week.   Find a type of exercise or a variety of exercises that you enjoy so that it becomes a part of your daily life.  Examples are running, walking, swimming, water aerobics, and biking.  For motivation and support, explore group exercise such as aerobic class, spin class, Zumba, Yoga,or  martial arts, etc.    Set exercise goals for yourself, such as a certain weight goal, walk or run in a race such as a 5k walk/run.  Speak to your primary care  provider about exercise goals.  Disease prevention:  If you smoke or chew tobacco, find out from your caregiver how to quit. It can literally save your life, no matter how long you have been a tobacco user. If you do not use tobacco, never begin.   Maintain a healthy diet and normal weight. Increased weight leads to problems with blood pressure and diabetes.   The Body Mass Index or BMI is a way of measuring  how much of your body is fat. Having a BMI above 27 increases the risk of heart disease, diabetes, hypertension, stroke and other problems related to obesity. Your caregiver can help determine your BMI and based on it develop an exercise and dietary program to help you achieve or maintain this important measurement at a healthful level.  High blood pressure causes heart and blood vessel problems.  Persistent high blood pressure should be treated with medicine if weight loss and exercise do not work.   Fat and cholesterol leaves deposits in your arteries that can block them. This causes heart disease and vessel disease elsewhere in your body.  If your cholesterol is found to be high, or if you have heart disease or certain other medical conditions, then you may need to have your cholesterol monitored frequently and be treated with medication.   Ask if you should have a cardiac stress test if your history suggests this. A stress test is a test done on a treadmill that looks for heart disease. This test can find disease prior to there being a problem.  Osteoporosis is a disease in which the bones lose minerals and strength as we age. This can result in serious bone fractures. Risk of osteoporosis can be identified using a bone density scan. Men ages 51 and over should discuss this with their caregivers. Ask your caregiver whether you should be taking a calcium supplement and Vitamin D, to reduce the rate of osteoporosis.   Avoid drinking alcohol in excess (more than two drinks per day).  Avoid use of street drugs. Do not share needles with anyone. Ask for professional help if you need assistance or instructions on stopping the use of alcohol, cigarettes, and/or drugs.  Brush your teeth twice a day with fluoride toothpaste, and floss once a day. Good oral hygiene prevents tooth decay and gum disease. The problems can be painful, unattractive, and can cause other health problems. Visit your dentist for a  routine oral and dental check up and preventive care every 6-12 months.   Look at your skin regularly.  Use a mirror to look at your back. Notify your caregivers of changes in moles, especially if there are changes in shapes, colors, a size larger than a pencil eraser, an irregular border, or development of new moles.  Safety:  Use seatbelts 100% of the time, whether driving or as a passenger.  Use safety devices such as hearing protection if you work in environments with loud noise or significant background noise.  Use safety glasses when doing any work that could send debris in to the eyes.  Use a helmet if you ride a bike or motorcycle.  Use appropriate safety gear for contact sports.  Talk to your caregiver about gun safety.  Use sunscreen with a SPF (or skin protection factor) of 15 or greater.  Lighter skinned people are at a greater risk of skin cancer. Don't forget to also wear sunglasses in order to protect your eyes from too much damaging sunlight. Damaging sunlight can accelerate  cataract formation.   Practice safe sex. Use condoms. Condoms are used for birth control and to help reduce the spread of sexually transmitted infections (or STIs).  Some of the STIs are gonorrhea (the clap), chlamydia, syphilis, trichomonas, herpes, HPV (human papilloma virus) and HIV (human immunodeficiency virus) which causes AIDS. The herpes, HIV and HPV are viral illnesses that have no cure. These can result in disability, cancer and death.   Keep carbon monoxide and smoke detectors in your home functioning at all times. Change the batteries every 6 months or use a model that plugs into the wall.   Vaccinations:  Stay up to date with your tetanus shots and other required immunizations. You should have a booster for tetanus every 10 years. Be sure to get your flu shot every year, since 5%-20% of the [place] population comes down with the flu. The flu vaccine changes each year, so being vaccinated once is not  enough. Get your shot in the fall, before the flu season peaks.   Other vaccines to consider:  Human Papilloma Virus or HPV causes cancer of the cervix, and other infections that can be transmitted from person to person. There is a vaccine for HPV, and males should get immunized between the ages of 14 and 40. It requires a series of 3 shots.   Pneumococcal vaccine to protect against certain types of pneumonia.  This is normally recommended for adults age 9 or older.  However, adults younger than 50 years old with certain underlying conditions such as diabetes, heart or lung disease should also receive the vaccine.  Shingles vaccine to protect against Varicella Zoster if you are older than age 45, or younger than 50 years old with certain underlying illness.  If you have not had the Shingrix vaccine, please call your insurer to inquire about coverage for the Shingrix vaccine given in 2 doses.   Some insurers cover this vaccine after age 60, some cover this after age 35.  If your insurer covers this, then call to schedule appointment to have this vaccine here  Hepatitis A vaccine to protect against a form of infection of the liver by a virus acquired from food.  Hepatitis B vaccine to protect against a form of infection of the liver by a virus acquired from blood or body fluids, particularly if you work in health care.  If you plan to travel internationally, check with your local health department for specific vaccination recommendations.   What should I know about cancer screening? Many types of cancers can be detected early and may often be prevented. Lung Cancer  You should be screened every year for lung cancer if: ? You are a current smoker who has smoked for at least 30 years. ? You are a former smoker who has quit within the past 15 years.  Talk to your health care provider about your screening options, when you should start screening, and how often you should be screened.  Colorectal  Cancer  Routine colorectal cancer screening usually begins at 50 years of age and should be repeated every 5-10 years until you are 50 years old. You may need to be screened more often if early forms of precancerous polyps or small growths are found. Your health care provider may recommend screening at an earlier age if you have risk factors for colon cancer.  Your health care provider may recommend using home test kits to check for hidden blood in the stool.  A small camera at the end  of a tube can be used to examine your colon (sigmoidoscopy or colonoscopy). This checks for the earliest forms of colorectal cancer.  Prostate and Testicular Cancer  Depending on your age and overall health, your health care provider may do certain tests to screen for prostate and testicular cancer.  Talk to your health care provider about any symptoms or concerns you have about testicular or prostate cancer.  Skin Cancer  Check your skin from head to toe regularly.  Tell your health care provider about any new moles or changes in moles, especially if: ? There is a change in a mole's size, shape, or color. ? You have a mole that is larger than a pencil eraser.  Always use sunscreen. Apply sunscreen liberally and repeat throughout the day.  Protect yourself by wearing long sleeves, pants, a wide-brimmed hat, and sunglasses when outside.

## 2017-10-08 NOTE — Progress Notes (Signed)
Subjective: Chief Complaint  Patient presents with  . CPE    fasting CPE  hearing issues   Here for physical, accompanied by girlfriend  Sees therapist and psychiatrist at [facility] Teauna Dubach, Kermit Balo, PA-C here for primary care. Dentist Eye doctor   Concerns: Decreased sex drive.  Still getting erections ok  See psychiatry for depression  Denies current substance abuse  Reviewed their medical, surgical, family, social, medication, and allergy history and updated chart as appropriate.  Past Medical History:  Diagnosis Date  . Chronic back pain    [facility]  . Erectile dysfunction   . History of substance abuse   . Hx of suicide attempt     Past Surgical History:  Procedure Laterality Date  . ANKLE SURGERY     right  . APPLICATION OF WOUND VAC Right 08/17/2015   Procedure: APPLICATION OF WOUND VAC;  Surgeon: Dominica Severin, MD;  Location: [facility] OR;  Service: Orthopedics;  Laterality: Right;  . HAND SURGERY     fracture, ORIG, teenage years, right.  . I&D EXTREMITY Right 08/10/2015   Procedure: IRRIGATION AND DEBRIDEMENT SKIN, SUBCUTANEOS TISSUE AND  MUSCLE, CARPAL TUNNEL RELEASE, MEDIAN NERVE LYSIS WITH NERVE WRAP;  Surgeon: Dominica Severin, MD;  Location: [facility] OR;  Service: Orthopedics;  Laterality: Right;  . INCISION AND DRAINAGE OF WOUND Right 08/15/2015   Procedure: IRRIGATION AND DEBRIDEMENT WOUND;  Surgeon: Francena Hanly, MD;  Location: [facility] ORS;  Service: Orthopedics;  Laterality: Right;  . LUMBAR EPIDURAL INJECTION     [facility]  . SKIN SPLIT GRAFT Right 08/17/2015   Procedure: I&D RIGHT FOREARM/POSSIBLE SKIN GRAFT;  Surgeon: Dominica Severin, MD;  Location: [facility] OR;  Service: Orthopedics;  Laterality: Right;  . TENDON REPAIR N/A 07/24/2015   Procedure: WRIST LACERATION AND TENDON REPAIR;  Surgeon: Bradly Bienenstock, MD;  Location: [facility] OR;  Service: Orthopedics;  Laterality: N/A;  . WOUND EXPLORATION Right  08/15/2015   Procedure: WOUND EXPLORATION right forearm;  Surgeon: Francena Hanly, MD;  Location: [facility];  Service: Orthopedics;  Laterality: Right;    Social History   Socioeconomic History  . Marital status: Single    Spouse name: Not on file  . Number of children: Not on file  . Years of education: Not on file  . Highest education level: Not on file  Occupational History  . Not on file  Social Needs  . Financial resource strain: Not on file  . Food insecurity:    Worry: Not on file    Inability: Not on file  . Transportation needs:    Medical: Not on file    Non-medical: Not on file  Tobacco Use  . Smoking status: Current Every Day Smoker    Packs/day: 0.50    Years: 6.00    Pack years: 3.00    Types: Cigarettes  . Smokeless tobacco: Never Used  Substance and Sexual Activity  . Alcohol use: No    Alcohol/week: 0.0 oz    Comment: occasional  . Drug use: Not Currently    Types: Marijuana, Cocaine    Comment: last use cocaine 08/08/15, last use MJ 08/08/15  . Sexual activity: Not on file  Lifestyle  . Physical activity:    Days per week: Not on file    Minutes per session: Not on file  . Stress: Not on file  Relationships  . Social connections:    Talks on phone: Not on file  Gets together: Not on file    Attends religious service: Not on file    Active member of club or organization: Not on file    Attends meetings of clubs or organizations: Not on file    Relationship status: Not on file  . Intimate partner violence:    Fear of current or ex partner: Not on file    Emotionally abused: Not on file    Physically abused: Not on file    Forced sexual activity: Not on file  Other Topics Concern  . 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   No children.  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   09/2017.     Family History  Problem Relation Age of Onset  . Cancer Mother         lung  . Anxiety disorder Mother   . Cancer Father        throat  . Diabetes Paternal Grandmother   . Cancer Paternal Aunt   . Heart disease Neg Hx   . Stroke Neg Hx      Current Outpatient Medications:  .  amLODipine (NORVASC) 10 MG tablet, TAKE 1 TABLET(10 MG) BY MOUTH DAILY, Disp: 30 tablet, Rfl: 0 .  ARIPiprazole (ABILIFY) 5 MG tablet, 5 mg., Disp: , Rfl: 0 .  cloNIDine (CATAPRES) 0.1 MG tablet, 0.1 mg., Disp: , Rfl: 0 .  FLUoxetine (PROZAC) 20 MG tablet, Take 20 mg by mouth 1 day or 1 dose., Disp: , Rfl: 0 .  HYDROcodone-acetaminophen (NORCO) 10-325 MG tablet, Take 1 tablet by mouth every 6 (six) hours as needed., Disp: , Rfl:  .  prazosin (MINIPRESS) 1 MG capsule, Take 1 mg by mouth at bedtime., Disp: , Rfl:   No Known Allergies   Review of Systems Constitutional: -fever, -chills, -sweats, -unexpected weight change, -decreased appetite, -fatigue Allergy: -sneezing, -itching, -congestion Dermatology: -changing moles, --rash, -lumps ENT: -runny nose, -ear pain, -sore throat, -hoarseness, -sinus pain, -teeth pain, - ringing in ears, -hearing loss, -nosebleeds Cardiology: -chest pain, -palpitations, -swelling, -difficulty breathing when lying flat, -waking up short of breath Respiratory: -cough, -shortness of breath, -difficulty breathing with exercise or exertion, -wheezing, -coughing up blood Gastroenterology: -abdominal pain, -nausea, -vomiting, -diarrhea, -constipation, -blood in stool, -changes in bowel movement, -difficulty swallowing or eating Hematology: -bleeding, -bruising  Musculoskeletal: -joint aches, -muscle aches, -joint swelling, -back pain, -neck pain, -cramping, -changes in gait Ophthalmology: denies vision changes, eye redness, itching, discharge Urology: -burning with urination, -difficulty urinating, -blood in urine, -urinary frequency, -urgency, -incontinence Neurology: -headache, -weakness, -tingling, -numbness, -memory loss, -falls, -dizziness Psychology:  -depressed mood, -agitation, -sleep problems Male GU: no testicular mass, pain, no lymph nodes swollen, no swelling, no rash.     Objective:  BP 130/90   Pulse 60   Temp 97.9 F (36.6 C) (Oral)   Resp 16   Ht 6' 0.5" (1.842 m)   Wt 213 lb 6.4 oz (96.8 kg)   SpO2 99%   BMI 28.54 kg/m   General appearance: alert, no distress, WD/WN, AA male Skin: scarring from prior lacerations bilat forearms volar surface HEENT: normocephalic, conjunctiva/corneas normal, sclerae anicteric, PERRLA, EOMi, nares patent, no discharge or erythema, pharynx normal Oral cavity: MMM, tongue normal, teeth in good repair Neck: supple, no lymphadenopathy, no thyromegaly, no masses, normal ROM, no bruits Chest: non tender, normal shape and expansion Heart: RRR, normal S1, S2, no murmurs Lungs: CTA bilaterally, no wheezes, rhonchi,  or rales Abdomen: +bs, soft, non tender, non distended, no masses, no hepatomegaly, no splenomegaly, no bruits Back: non tender, normal ROM, no scoliosis Musculoskeletal: upper extremities non tender, no obvious deformity, normal ROM throughout, lower extremities non tender, no obvious deformity, normal ROM throughout Extremities: no edema, no cyanosis, no clubbing Pulses: 2+ symmetric, upper and lower extremities, normal cap refill Neurological: alert, oriented x 3, CN2-12 intact, strength normal upper extremities and lower extremities, sensation normal throughout, DTRs 2+ throughout, no cerebellar signs, gait normal Psychiatric: normal affect, behavior normal, pleasant  GU: normal male external genitalia,circumcised, nontender, no masses, no hernia, no lymphadenopathy Rectal: anus normal tone, prostate WNL   Assessment and Plan :   Encounter Diagnoses  Name Primary?  . Encounter for health maintenance examination in adult Yes  . Screen for colon cancer   . Screening for prostate cancer   . Chronic kidney disease (CKD), stage III (moderate) (HCC)   . Erectile dysfunction,  unspecified erectile dysfunction type   . Essential hypertension   . Chronic bilateral low back pain, with sciatica presence unspecified   . Depression, unspecified depression type   . Smoker     Physical exam - discussed and counseled on healthy lifestyle, diet, exercise, preventative care, vaccinations, sick and well care, proper use of emergency dept and after hours care, and addressed their concerns.    Health screening: See your eye doctor yearly for routine vision care. See your dentist yearly for routine dental care including hygiene visits twice yearly.  Cancer screening Advised monthly self testicular exam  Colonoscopy:  Referred for screening colonoscopy  Discussed PSA, prostate exam, and prostate cancer screening risks/benefits.     Vaccinations: Advised yearly influenza vaccine  Counseled on Shingles vaccine at age 50 years and older Patient will check insurance coverage for this and consider vaccination  He is up to date on Tdap   Separate significant chronic issues discussed: Advised smoking cessation  C/t with psychiatry care  HTN - c/t same medication.   Home BPs WNL  Chronic low back pain - managed by [facility]  Benjamin RuizJohn was seen today for cpe.  Diagnoses and all orders for this visit:  Encounter for health maintenance examination in adult -     Comprehensive metabolic panel -     CBC -     PSA -     TSH -     Lipid panel -     Testosterone  Screen for colon cancer -     Ambulatory referral to Gastroenterology  Screening for prostate cancer -     PSA  Chronic kidney disease (CKD), stage III (moderate) (HCC)  Erectile dysfunction, unspecified erectile dysfunction type -     Testosterone  Essential hypertension  Chronic bilateral low back pain, with sciatica presence unspecified  Depression, unspecified depression type  Smoker   Follow-up pending labs, yearly for physical

## 2017-10-09 ENCOUNTER — Encounter: Payer: Self-pay | Admitting: Gastroenterology

## 2017-10-09 LAB — COMPREHENSIVE METABOLIC PANEL
ALBUMIN: 5.4 g/dL (ref 3.5–5.5)
ALT: 32 IU/L (ref 0–44)
AST: 39 IU/L (ref 0–40)
Albumin/Globulin Ratio: 2.7 — ABNORMAL HIGH (ref 1.2–2.2)
Alkaline Phosphatase: 40 IU/L (ref 39–117)
BUN / CREAT RATIO: 9 (ref 9–20)
BUN: 12 mg/dL (ref 6–24)
Bilirubin Total: 1.5 mg/dL — ABNORMAL HIGH (ref 0.0–1.2)
CALCIUM: 9.4 mg/dL (ref 8.7–10.2)
CO2: 27 mmol/L (ref 20–29)
CREATININE: 1.39 mg/dL — AB (ref 0.76–1.27)
Chloride: 101 mmol/L (ref 96–106)
GFR, EST AFRICAN AMERICAN: 68 mL/min/{1.73_m2} (ref >59)
GFR, EST NON AFRICAN AMERICAN: 59 mL/min/{1.73_m2} — AB (ref >59)
GLOBULIN, TOTAL: 2 g/dL (ref 1.5–4.5)
GLUCOSE: 97 mg/dL (ref 65–99)
Potassium: 4.8 mmol/L (ref 3.5–5.2)
SODIUM: 141 mmol/L (ref 134–144)
TOTAL PROTEIN: 7.4 g/dL (ref 6.0–8.5)

## 2017-10-09 LAB — TESTOSTERONE: Testosterone: 569 ng/dL (ref 264–916)

## 2017-10-09 LAB — PSA: Prostate Specific Ag, Serum: 0.5 ng/mL (ref 0.0–4.0)

## 2017-10-09 LAB — LIPID PANEL
CHOL/HDL RATIO: 4.4 ratio (ref 0.0–5.0)
Cholesterol, Total: 254 mg/dL — ABNORMAL HIGH (ref 100–199)
HDL: 58 mg/dL (ref >39)
LDL CALC: 159 mg/dL — AB (ref 0–99)
TRIGLYCERIDES: 183 mg/dL — AB (ref 0–149)
VLDL CHOLESTEROL CAL: 37 mg/dL (ref 5–40)

## 2017-10-09 LAB — CBC
HEMATOCRIT: 42 % (ref 37.5–51.0)
Hemoglobin: 13.6 g/dL (ref 13.0–17.7)
MCH: 24.5 pg — AB (ref 26.6–33.0)
MCHC: 32.4 g/dL (ref 31.5–35.7)
MCV: 76 fL — ABNORMAL LOW (ref 79–97)
PLATELETS: 144 10*3/uL — AB (ref 150–450)
RBC: 5.56 x10E6/uL (ref 4.14–5.80)
RDW: 16.9 % — AB (ref 12.3–15.4)
WBC: 4 10*3/uL (ref 3.4–10.8)

## 2017-10-09 LAB — TSH: TSH: 1.64 u[IU]/mL (ref 0.450–4.500)

## 2017-10-10 ENCOUNTER — Other Ambulatory Visit: Payer: Self-pay | Admitting: Medical

## 2017-10-10 MED ORDER — AMLODIPINE BESYLATE 10 MG PO TABS: ORAL_TABLET | ORAL | 3 refills | Status: DC

## 2017-10-10 MED ORDER — LISINOPRIL 10 MG PO TABS: 10.0000 mg | ORAL_TABLET | Freq: Every day | ORAL | 3 refills | Status: DC

## 2017-10-17 ENCOUNTER — Telehealth: Payer: Self-pay | Admitting: Medical

## 2017-10-17 NOTE — Telephone Encounter (Signed)
Pt girlfriend called and is stating that the prozac that the pt is taking is not helping pt sleep, states he can not even take a nap, its so bad. that the PSy dr is prescribing him they are wanting to know if you will give him something else to help, him, he can be reached at [phone] and pt uses  [id number] - [place], [place] - [address] AT [facility] [address]

## 2017-10-18 NOTE — Telephone Encounter (Signed)
I would defer this to psychiatry since they are prescribing his mood medications.  He can try OTC Benadryl or Melatonin, but generally psychiatrists have specific medication they use for sleep, so needs to check with psychiatry  In general I don't recommend day time naps as this interferes with sleep. Try and make bed time and wake time consistent Don't watch tv or screen time or read in the bed.  Do this in another room

## 2017-10-18 NOTE — Telephone Encounter (Signed)
Patient notified of recommendations. 

## 2017-10-25 DIAGNOSIS — N183 Chronic kidney disease, stage 3 unspecified: Secondary | ICD-10-CM

## 2017-10-25 HISTORY — DX: Chronic kidney disease, stage 3 unspecified: N18.30

## 2017-11-07 ENCOUNTER — Emergency Department (HOSPITAL_COMMUNITY): Payer: BLUE CROSS/BLUE SHIELD

## 2017-11-07 ENCOUNTER — Other Ambulatory Visit: Payer: Self-pay

## 2017-11-07 ENCOUNTER — Encounter (HOSPITAL_COMMUNITY): Payer: Self-pay

## 2017-11-07 ENCOUNTER — Inpatient Hospital Stay (HOSPITAL_COMMUNITY)
Admission: EM | Admit: 2017-11-07 | Discharge: 2017-11-13 | DRG: 682 | Disposition: A | Discharge status: Other Acute Inpatient Hospital | Payer: BLUE CROSS/BLUE SHIELD | Attending: Family Medicine | Admitting: Family Medicine

## 2017-11-07 DIAGNOSIS — F4310 Post-traumatic stress disorder, unspecified: Secondary | ICD-10-CM | POA: Diagnosis present

## 2017-11-07 DIAGNOSIS — F1920 Other psychoactive substance dependence, uncomplicated: Secondary | ICD-10-CM | POA: Diagnosis not present

## 2017-11-07 DIAGNOSIS — F17210 Nicotine dependence, cigarettes, uncomplicated: Secondary | ICD-10-CM | POA: Diagnosis present

## 2017-11-07 DIAGNOSIS — S51011A Laceration without foreign body of right elbow, initial encounter: Secondary | ICD-10-CM | POA: Diagnosis present

## 2017-11-07 DIAGNOSIS — W19XXXA Unspecified fall, initial encounter: Secondary | ICD-10-CM | POA: Diagnosis present

## 2017-11-07 DIAGNOSIS — M6282 Rhabdomyolysis: Secondary | ICD-10-CM | POA: Diagnosis present

## 2017-11-07 DIAGNOSIS — F32A Depression, unspecified: Secondary | ICD-10-CM

## 2017-11-07 DIAGNOSIS — F410 Panic disorder [episodic paroxysmal anxiety] without agoraphobia: Secondary | ICD-10-CM | POA: Diagnosis not present

## 2017-11-07 DIAGNOSIS — I340 Nonrheumatic mitral (valve) insufficiency: Secondary | ICD-10-CM | POA: Diagnosis not present

## 2017-11-07 DIAGNOSIS — G9341 Metabolic encephalopathy: Secondary | ICD-10-CM | POA: Diagnosis present

## 2017-11-07 DIAGNOSIS — D72829 Elevated white blood cell count, unspecified: Secondary | ICD-10-CM | POA: Diagnosis present

## 2017-11-07 DIAGNOSIS — N179 Acute kidney failure, unspecified: Principal | ICD-10-CM

## 2017-11-07 DIAGNOSIS — G8929 Other chronic pain: Secondary | ICD-10-CM | POA: Diagnosis not present

## 2017-11-07 DIAGNOSIS — R4182 Altered mental status, unspecified: Secondary | ICD-10-CM | POA: Diagnosis not present

## 2017-11-07 DIAGNOSIS — F1210 Cannabis abuse, uncomplicated: Secondary | ICD-10-CM | POA: Diagnosis present

## 2017-11-07 DIAGNOSIS — F1020 Alcohol dependence, uncomplicated: Secondary | ICD-10-CM | POA: Diagnosis not present

## 2017-11-07 DIAGNOSIS — I248 Other forms of acute ischemic heart disease: Secondary | ICD-10-CM | POA: Diagnosis present

## 2017-11-07 DIAGNOSIS — F332 Major depressive disorder, recurrent severe without psychotic features: Secondary | ICD-10-CM | POA: Diagnosis not present

## 2017-11-07 DIAGNOSIS — E860 Dehydration: Secondary | ICD-10-CM | POA: Diagnosis present

## 2017-11-07 DIAGNOSIS — J302 Other seasonal allergic rhinitis: Secondary | ICD-10-CM | POA: Diagnosis not present

## 2017-11-07 DIAGNOSIS — G4700 Insomnia, unspecified: Secondary | ICD-10-CM | POA: Diagnosis not present

## 2017-11-07 DIAGNOSIS — I959 Hypotension, unspecified: Secondary | ICD-10-CM | POA: Diagnosis not present

## 2017-11-07 DIAGNOSIS — F1410 Cocaine abuse, uncomplicated: Secondary | ICD-10-CM | POA: Diagnosis present

## 2017-11-07 DIAGNOSIS — E872 Acidosis, unspecified: Secondary | ICD-10-CM

## 2017-11-07 DIAGNOSIS — G894 Chronic pain syndrome: Secondary | ICD-10-CM | POA: Diagnosis present

## 2017-11-07 DIAGNOSIS — Z915 Personal history of self-harm: Secondary | ICD-10-CM

## 2017-11-07 DIAGNOSIS — I10 Essential (primary) hypertension: Secondary | ICD-10-CM | POA: Diagnosis present

## 2017-11-07 DIAGNOSIS — F329 Major depressive disorder, single episode, unspecified: Secondary | ICD-10-CM | POA: Diagnosis not present

## 2017-11-07 DIAGNOSIS — I071 Rheumatic tricuspid insufficiency: Secondary | ICD-10-CM | POA: Diagnosis present

## 2017-11-07 DIAGNOSIS — D649 Anemia, unspecified: Secondary | ICD-10-CM | POA: Diagnosis present

## 2017-11-07 DIAGNOSIS — F419 Anxiety disorder, unspecified: Secondary | ICD-10-CM | POA: Diagnosis not present

## 2017-11-07 DIAGNOSIS — R45851 Suicidal ideations: Secondary | ICD-10-CM | POA: Diagnosis present

## 2017-11-07 DIAGNOSIS — R0602 Shortness of breath: Secondary | ICD-10-CM

## 2017-11-07 DIAGNOSIS — N183 Chronic kidney disease, stage 3 unspecified: Secondary | ICD-10-CM | POA: Diagnosis present

## 2017-11-07 DIAGNOSIS — M549 Dorsalgia, unspecified: Secondary | ICD-10-CM

## 2017-11-07 DIAGNOSIS — I129 Hypertensive chronic kidney disease with stage 1 through stage 4 chronic kidney disease, or unspecified chronic kidney disease: Secondary | ICD-10-CM | POA: Diagnosis present

## 2017-11-07 DIAGNOSIS — F1420 Cocaine dependence, uncomplicated: Secondary | ICD-10-CM | POA: Diagnosis not present

## 2017-11-07 DIAGNOSIS — F1120 Opioid dependence, uncomplicated: Secondary | ICD-10-CM | POA: Diagnosis not present

## 2017-11-07 DIAGNOSIS — M545 Low back pain: Secondary | ICD-10-CM | POA: Diagnosis not present

## 2017-11-07 DIAGNOSIS — D631 Anemia in chronic kidney disease: Secondary | ICD-10-CM | POA: Diagnosis not present

## 2017-11-07 HISTORY — DX: Essential (primary) hypertension: I10

## 2017-11-07 HISTORY — DX: Chronic kidney disease, stage 3 (moderate): N18.3

## 2017-11-07 LAB — URINALYSIS, ROUTINE W REFLEX MICROSCOPIC
Bilirubin Urine: NEGATIVE
GLUCOSE, UA: NEGATIVE mg/dL
KETONES UR: 5 mg/dL — AB
Leukocytes, UA: NEGATIVE
Nitrite: NEGATIVE
PH: 5 (ref 5.0–8.0)
Protein, ur: 100 mg/dL — AB
Specific Gravity, Urine: 1.023 (ref 1.005–1.030)

## 2017-11-07 LAB — CBC WITH DIFFERENTIAL/PLATELET
ABS IMMATURE GRANULOCYTES: 0.3 10*3/uL — AB (ref 0.0–0.1)
BASOS ABS: 0 10*3/uL (ref 0.0–0.1)
Basophils Relative: 0 %
EOS ABS: 0 10*3/uL (ref 0.0–0.7)
Eosinophils Relative: 0 %
HCT: 33.7 % — ABNORMAL LOW (ref 39.0–52.0)
Hemoglobin: 10.5 g/dL — ABNORMAL LOW (ref 13.0–17.0)
Immature Granulocytes: 2 %
Lymphocytes Relative: 11 %
Lymphs Abs: 1.7 10*3/uL (ref 0.7–4.0)
MCH: 24.2 pg — ABNORMAL LOW (ref 26.0–34.0)
MCHC: 31.2 g/dL (ref 30.0–36.0)
MCV: 77.8 fL — ABNORMAL LOW (ref 78.0–100.0)
MONO ABS: 0.7 10*3/uL (ref 0.1–1.0)
Monocytes Relative: 5 %
NEUTROS ABS: 12.9 10*3/uL — AB (ref 1.7–7.7)
NEUTROS PCT: 82 %
PLATELETS: 215 10*3/uL (ref 150–400)
RBC: 4.33 MIL/uL (ref 4.22–5.81)
RDW: 15.6 % — AB (ref 11.5–15.5)
WBC: 15.6 10*3/uL — ABNORMAL HIGH (ref 4.0–10.5)

## 2017-11-07 LAB — COMPREHENSIVE METABOLIC PANEL
ALT: 30 U/L (ref 0–44)
ANION GAP: 19 — AB (ref 5–15)
AST: 52 U/L — ABNORMAL HIGH (ref 15–41)
Albumin: 4 g/dL (ref 3.5–5.0)
Alkaline Phosphatase: 35 U/L — ABNORMAL LOW (ref 38–126)
BUN: 23 mg/dL — ABNORMAL HIGH (ref 6–20)
CALCIUM: 8.9 mg/dL (ref 8.9–10.3)
CHLORIDE: 106 mmol/L (ref 98–111)
CO2: 16 mmol/L — ABNORMAL LOW (ref 22–32)
CREATININE: 3.24 mg/dL — AB (ref 0.61–1.24)
GFR calc Af Amer: 24 mL/min — ABNORMAL LOW (ref >60)
GFR, EST NON AFRICAN AMERICAN: 21 mL/min — AB (ref >60)
Glucose, Bld: 150 mg/dL — ABNORMAL HIGH (ref 70–99)
Potassium: 3.3 mmol/L — ABNORMAL LOW (ref 3.5–5.1)
Sodium: 141 mmol/L (ref 135–145)
Total Bilirubin: 3.5 mg/dL — ABNORMAL HIGH (ref 0.3–1.2)
Total Protein: 5.7 g/dL — ABNORMAL LOW (ref 6.5–8.1)

## 2017-11-07 LAB — I-STAT CHEM 8, ED
BUN: 24 mg/dL — AB (ref 6–20)
CALCIUM ION: 0.97 mmol/L — AB (ref 1.15–1.40)
CREATININE: 3.2 mg/dL — AB (ref 0.61–1.24)
Chloride: 106 mmol/L (ref 98–111)
GLUCOSE: 174 mg/dL — AB (ref 70–99)
HCT: 35 % — ABNORMAL LOW (ref 39.0–52.0)
Hemoglobin: 11.9 g/dL — ABNORMAL LOW (ref 13.0–17.0)
Potassium: 3.7 mmol/L (ref 3.5–5.1)
Sodium: 137 mmol/L (ref 135–145)
TCO2: 14 mmol/L — ABNORMAL LOW (ref 22–32)

## 2017-11-07 LAB — MAGNESIUM: Magnesium: 2.4 mg/dL (ref 1.7–2.4)

## 2017-11-07 LAB — ETHANOL

## 2017-11-07 LAB — LACTIC ACID, PLASMA
Lactic Acid, Venous: 4.5 mmol/L (ref 0.5–1.9)
Lactic Acid, Venous: 3.2 mmol/L (ref 0.5–1.9)

## 2017-11-07 LAB — I-STAT CG4 LACTIC ACID, ED
LACTIC ACID, VENOUS: 8.71 mmol/L — AB (ref 0.5–1.9)
Lactic Acid, Venous: 10.23 mmol/L (ref 0.5–1.9)

## 2017-11-07 LAB — RAPID URINE DRUG SCREEN, HOSP PERFORMED
Amphetamines: NOT DETECTED
BARBITURATES: NOT DETECTED
BENZODIAZEPINES: NOT DETECTED
Cocaine: POSITIVE — AB
Opiates: NOT DETECTED
Tetrahydrocannabinol: NOT DETECTED

## 2017-11-07 LAB — TROPONIN I
TROPONIN I: 0.26 ng/mL — AB (ref <0.03)
Troponin I: 0.43 ng/mL (ref <0.03)

## 2017-11-07 LAB — CK: CK TOTAL: 869 U/L — AB (ref 49–397)

## 2017-11-07 LAB — LIPASE, BLOOD: Lipase: 31 U/L (ref 11–51)

## 2017-11-07 LAB — PROTIME-INR
INR: 1.24
PROTHROMBIN TIME: 15.5 s — AB (ref 11.4–15.2)

## 2017-11-07 LAB — AMMONIA: Ammonia: 25 umol/L (ref 9–35)

## 2017-11-07 LAB — PROCALCITONIN: Procalcitonin: 0.12 ng/mL

## 2017-11-07 LAB — CBG MONITORING, ED: Glucose-Capillary: 148 mg/dL — ABNORMAL HIGH (ref 70–99)

## 2017-11-07 MED ORDER — VITAMIN B-1 100 MG PO TABS
100.0000 mg | ORAL_TABLET | Freq: Every day | ORAL | Status: DC
  Administered 2017-11-07 – 2017-11-13 (×7): 100 mg via ORAL
  Filled 2017-11-07 (×7): qty 1

## 2017-11-07 MED ORDER — HEPARIN SODIUM (PORCINE) 5000 UNIT/ML IJ SOLN
5000.0000 [IU] | Freq: Three times a day (TID) | INTRAMUSCULAR | Status: DC
  Administered 2017-11-07 – 2017-11-13 (×18): 5000 [IU] via SUBCUTANEOUS
  Filled 2017-11-07 (×16): qty 1

## 2017-11-07 MED ORDER — SODIUM CHLORIDE 0.9 % IV SOLN
1.0000 g | INTRAVENOUS | Status: DC
  Administered 2017-11-08: 1 g via INTRAVENOUS
  Filled 2017-11-07: qty 1

## 2017-11-07 MED ORDER — SODIUM CHLORIDE 0.9 % IV SOLN
1.0000 g | Freq: Once | INTRAVENOUS | Status: AC
  Administered 2017-11-07: 1 g via INTRAVENOUS
  Filled 2017-11-07 (×2): qty 10

## 2017-11-07 MED ORDER — ONDANSETRON HCL 4 MG/2ML IJ SOLN: 4.0000 mg | Freq: Four times a day (QID) | INTRAMUSCULAR | Status: DC | PRN

## 2017-11-07 MED ORDER — LIDOCAINE HCL 2 % IJ SOLN
10.0000 mL | Freq: Once | INTRAMUSCULAR | Status: AC
  Administered 2017-11-07: 200 mg
  Filled 2017-11-07: qty 20

## 2017-11-07 MED ORDER — VANCOMYCIN HCL 10 G IV SOLR
1500.0000 mg | Freq: Once | INTRAVENOUS | Status: AC
  Administered 2017-11-07: 1500 mg via INTRAVENOUS
  Filled 2017-11-07: qty 1500

## 2017-11-07 MED ORDER — SODIUM CHLORIDE 0.9 % IV SOLN
INTRAVENOUS | Status: DC
  Administered 2017-11-07 – 2017-11-11 (×9): via INTRAVENOUS

## 2017-11-07 MED ORDER — HYDROCODONE-ACETAMINOPHEN 10-325 MG PO TABS
1.0000 | ORAL_TABLET | Freq: Four times a day (QID) | ORAL | Status: DC | PRN
  Administered 2017-11-07 – 2017-11-08 (×3): 1 via ORAL
  Filled 2017-11-07 (×3): qty 1

## 2017-11-07 MED ORDER — ONDANSETRON HCL 4 MG PO TABS: 4.0000 mg | ORAL_TABLET | Freq: Four times a day (QID) | ORAL | Status: DC | PRN

## 2017-11-07 MED ORDER — VANCOMYCIN HCL IN DEXTROSE 1-5 GM/200ML-% IV SOLN: 1000.0000 mg | Freq: Once | INTRAVENOUS | Status: DC

## 2017-11-07 MED ORDER — POLYETHYLENE GLYCOL 3350 17 G PO PACK: 17.0000 g | PACK | Freq: Every day | ORAL | Status: DC | PRN

## 2017-11-07 MED ORDER — METRONIDAZOLE IN NACL 5-0.79 MG/ML-% IV SOLN
500.0000 mg | Freq: Three times a day (TID) | INTRAVENOUS | Status: DC
  Administered 2017-11-07 – 2017-11-10 (×10): 500 mg via INTRAVENOUS
  Filled 2017-11-07 (×10): qty 100

## 2017-11-07 MED ORDER — FOLIC ACID 1 MG PO TABS
1.0000 mg | ORAL_TABLET | Freq: Every day | ORAL | Status: DC
  Administered 2017-11-07 – 2017-11-13 (×7): 1 mg via ORAL
  Filled 2017-11-07 (×7): qty 1

## 2017-11-07 MED ORDER — SODIUM CHLORIDE 0.9 % IV BOLUS (SEPSIS)
1000.0000 mL | Freq: Once | INTRAVENOUS | Status: AC
  Administered 2017-11-07: 1000 mL via INTRAVENOUS

## 2017-11-07 MED ORDER — SODIUM CHLORIDE 0.9 % IV BOLUS
2000.0000 mL | Freq: Once | INTRAVENOUS | Status: AC
  Administered 2017-11-07: 2000 mL via INTRAVENOUS

## 2017-11-07 MED ORDER — VANCOMYCIN HCL 10 G IV SOLR
1250.0000 mg | INTRAVENOUS | Status: DC
  Administered 2017-11-08: 1250 mg via INTRAVENOUS
  Filled 2017-11-07: qty 1250

## 2017-11-07 MED ORDER — SODIUM CHLORIDE 0.9 % IV SOLN
2.0000 g | Freq: Once | INTRAVENOUS | Status: AC
  Administered 2017-11-07: 2 g via INTRAVENOUS
  Filled 2017-11-07: qty 2
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
## 2017-11-07 NOTE — ED Notes (Signed)
  Test: Troponin  Critical Value: 0.26   Name of Provider Notified: Dr. Jacqulyn BathLong

## 2017-11-07 NOTE — Progress Notes (Signed)
Pharmacy Antibiotic Note  Mady HaagensenJohn Cody is a 52 y.o. male admitted on 11/07/2017 with AMS.  Pharmacy has been consulted for vancomycin and cefepime dosing. Patient with documented substance abuse history and noted to have fallen this morning after drinking and smoking crack last night.   Plan: Vancomycin 1500mg  IV once then 1250mg  IV every 24 hours.  Goal trough 15-20 mcg/mL. Cefepime 2G IV once then 1G IV q24 hours  Height: 6\' 2"  (188 cm) Weight: 200 lb (90.7 kg) IBW/kg (Calculated) : 82.2  No data recorded.  Recent Labs  Lab 11/07/17 1255  CREATININE 3.20*  LATICACIDVEN 10.23*    Estimated Creatinine Clearance: 32.1 mL/min (A) (by C-G formula based on SCr of 3.2 mg/dL (H)).    No Known Allergies  Antimicrobials this admission: Vancomycin 8/14>> Cefepime 8/14>>  Thank you for allowing pharmacy to be a part of this patient's care.  Ruben Imony Jupiter Boys, PharmD Clinical Pharmacist 11/07/2017 1:16 PM Please check AMION for all [facility] numbers

## 2017-11-07 NOTE — ED Provider Notes (Signed)
Emergency Department Provider Note   I have reviewed the triage vital signs and the nursing notes.   HISTORY  Chief Complaint Altered Mental Status and Laceration   HPI Benjamin [name] is a 52 y.o. male with PMH of polysubstance abuse, chronic back pain, and h/o suicide attempt resents to the emergency department by EMS with altered mental status, hypotension, laceration to the right arm.  The patient states that he was drinking and using crack cocaine last night.  He states this morning he fell causing the laceration to his right arm.  He states he had heavy bleeding.  He has difficulty providing additional history.  According to EMS the patient failed to check out of his hotel room and when they came into clean he was found on the bed, covered in feces, and minimally responsive.   Patient reports having pain in his right arm near the laceration and lower back.  He denies any chest pain, dyspnea, abdominal pain.   Level 5 caveat: AMS  Past Medical History:  Diagnosis Date  . Chronic back pain    managed by [facility]  . CKD (chronic kidney disease), stage III (HCC) 10/2017  . Erectile dysfunction   . History of substance abuse   . Hx of suicide attempt   . Hypertension     Patient Active Problem List   Diagnosis Date Noted  . AKI (acute kidney injury) (HCC) 11/07/2017  . Hypotension 11/07/2017  . Leukocytosis 11/07/2017  . Chronic kidney disease (CKD), stage III (moderate) (HCC) 09/19/2016  . Elevated LFTs 09/19/2016  . MDD (major depressive disorder), recurrent episode, severe (HCC) 08/24/2015  . Insomnia   . MDD (major depressive disorder), single episode, severe , no psychosis (HCC) 08/14/2015  . Cannabis use disorder, mild, abuse 08/14/2015  . Cocaine abuse (HCC) 08/10/2015  . Weight loss, non-intentional 08/10/2015  . Depression 08/10/2015  . Essential hypertension   . Vaccine refused by patient 05/24/2015  . Routine general medical examination  at a health care facility 05/24/2015  . Chronic back pain 05/24/2015  . Family history of cancer 05/24/2015  . Screening for prostate cancer 05/24/2015  . Smoker 05/24/2015  . Screen for colon cancer 05/24/2015  . Erectile dysfunction 01/31/2014    Past Surgical History:  Procedure Laterality Date  . ANKLE SURGERY     right  . APPLICATION OF WOUND VAC Right 08/17/2015   Procedure: APPLICATION OF WOUND VAC;  Surgeon: Dominica Severin, MD;  Location: [facility] OR;  Service: Orthopedics;  Laterality: Right;  . HAND SURGERY     fracture, ORIG, teenage years, right.  . I&D EXTREMITY Right 08/10/2015   Procedure: IRRIGATION AND DEBRIDEMENT SKIN, SUBCUTANEOS TISSUE AND  MUSCLE, CARPAL TUNNEL RELEASE, MEDIAN NERVE LYSIS WITH NERVE WRAP;  Surgeon: Dominica Severin, MD;  Location: [facility] OR;  Service: Orthopedics;  Laterality: Right;  . INCISION AND DRAINAGE OF WOUND Right 08/15/2015   Procedure: IRRIGATION AND DEBRIDEMENT WOUND;  Surgeon: Francena Hanly, MD;  Location: [facility];  Service: Orthopedics;  Laterality: Right;  . LUMBAR EPIDURAL INJECTION     [facility]  . SKIN SPLIT GRAFT Right 08/17/2015   Procedure: I&D RIGHT FOREARM/POSSIBLE SKIN GRAFT;  Surgeon: Dominica Severin, MD;  Location: [facility] OR;  Service: Orthopedics;  Laterality: Right;  . TENDON REPAIR N/A 07/24/2015   Procedure: WRIST LACERATION AND TENDON REPAIR;  Surgeon: Bradly Bienenstock, MD;  Location: [facility] OR;  Service: Orthopedics;  Laterality: N/A;  . WOUND EXPLORATION Right 08/15/2015  Procedure: WOUND EXPLORATION right forearm;  Surgeon: Francena Hanly, MD;  Location: [facility];  Service: Orthopedics;  Laterality: Right;      Allergies Patient has no known allergies.  Family History  Problem Relation Age of Onset  . Cancer Mother        lung  . Anxiety disorder Mother   . Cancer Father        throat  . Diabetes Paternal Grandmother   . Cancer Paternal Aunt   . Heart disease Neg Hx   . Stroke Neg Hx     Social History Social  History   Tobacco Use  . Smoking status: Current Every Day Smoker    Packs/day: 0.50    Years: 6.00    Pack years: 3.00    Types: Cigarettes  . Smokeless tobacco: Never Used  Substance Use Topics  . Alcohol use: Yes    Alcohol/week: 0.0 standard drinks    Comment: occasional  . Drug use: Yes    Types: Marijuana, Cocaine    Comment: last use cocaine 08/08/15, last use MJ 08/08/15    Review of Systems  Level 5 caveat: AMS   ____________________________________________   PHYSICAL EXAM:  VITAL SIGNS: ED Triage Vitals  Enc Vitals Group     BP 11/07/17 1233 (!) 72/58     Pulse Rate 11/07/17 1233 (!) 103     Resp 11/07/17 1233 19     Temp --      Temp src --      SpO2 11/07/17 1233 100 %     Weight 11/07/17 1236 200 lb (90.7 kg)     Height 11/07/17 1236 6\' 2"  (1.88 m)     Pain Score 11/07/17 1236 10   Constitutional: Alert with rapid breathing and some mild confusion.  Eyes: Conjunctivae are normal.  Head: Atraumatic. Nose: No congestion/rhinnorhea. Mouth/Throat: Mucous membranes are very dry.  Neck: No stridor.  Cardiovascular: Tachycardia. Good peripheral circulation. Grossly normal heart sounds.   Respiratory: Increased respiratory effort.  No retractions. Lungs CTAB. Gastrointestinal: Soft and nontender. No distention.  Musculoskeletal: No lower extremity tenderness nor edema. No gross deformities of extremities. Neurologic:  Normal speech and language. No gross focal neurologic deficits are appreciated.  Skin:  Skin is warm, dry and intact. No rash noted.  ____________________________________________   LABS (all labs ordered are listed, but only abnormal results are displayed)  Labs Reviewed  COMPREHENSIVE METABOLIC PANEL - Abnormal; Notable for the following components:      Result Value   Potassium 3.3 (*)    CO2 16 (*)    Glucose, Bld 150 (*)    BUN 23 (*)    Creatinine, Ser 3.24 (*)    Total Protein 5.7 (*)    AST 52 (*)    Alkaline Phosphatase 35  (*)    Total Bilirubin 3.5 (*)    GFR calc non Af Amer 21 (*)    GFR calc Af Amer 24 (*)    Anion gap 19 (*)    All other components within normal limits  TROPONIN I - Abnormal; Notable for the following components:   Troponin I 0.26 (*)    All other components within normal limits  CBC WITH DIFFERENTIAL/PLATELET - Abnormal; Notable for the following components:   WBC 15.6 (*)    Hemoglobin 10.5 (*)    HCT 33.7 (*)    MCV 77.8 (*)    MCH 24.2 (*)    RDW 15.6 (*)    Neutro Abs  12.9 (*)    Abs Immature Granulocytes 0.3 (*)    All other components within normal limits  PROTIME-INR - Abnormal; Notable for the following components:   Prothrombin Time 15.5 (*)    All other components within normal limits  URINALYSIS, ROUTINE W REFLEX MICROSCOPIC - Abnormal; Notable for the following components:   Color, Urine AMBER (*)    APPearance CLOUDY (*)    Hgb urine dipstick MODERATE (*)    Ketones, ur 5 (*)    Protein, ur 100 (*)    Bacteria, UA RARE (*)    All other components within normal limits  RAPID URINE DRUG SCREEN, HOSP PERFORMED - Abnormal; Notable for the following components:   Cocaine POSITIVE (*)    All other components within normal limits  CK - Abnormal; Notable for the following components:   Total CK 869 (*)    All other components within normal limits  TROPONIN I - Abnormal; Notable for the following components:   Troponin I 0.43 (*)    All other components within normal limits  LACTIC ACID, PLASMA - Abnormal; Notable for the following components:   Lactic Acid, Venous 4.5 (*)    All other components within normal limits  LACTIC ACID, PLASMA - Abnormal; Notable for the following components:   Lactic Acid, Venous 3.2 (*)    All other components within normal limits  I-STAT CG4 LACTIC ACID, ED - Abnormal; Notable for the following components:   Lactic Acid, Venous 10.23 (*)    All other components within normal limits  I-STAT CHEM 8, ED - Abnormal; Notable for the  following components:   BUN 24 (*)    Creatinine, Ser 3.20 (*)    Glucose, Bld 174 (*)    Calcium, Ion 0.97 (*)    TCO2 14 (*)    Hemoglobin 11.9 (*)    HCT 35.0 (*)    All other components within normal limits  CBG MONITORING, ED - Abnormal; Notable for the following components:   Glucose-Capillary 148 (*)    All other components within normal limits  I-STAT CG4 LACTIC ACID, ED - Abnormal; Notable for the following components:   Lactic Acid, Venous 8.71 (*)    All other components within normal limits  URINE CULTURE  CULTURE, BLOOD (ROUTINE X 2)  CULTURE, BLOOD (ROUTINE X 2)  ETHANOL  LIPASE, BLOOD  AMMONIA  MAGNESIUM  PROCALCITONIN  TSH  HIV ANTIBODY (ROUTINE TESTING)  BASIC METABOLIC PANEL  CBC  TROPONIN I  TROPONIN I  PROCALCITONIN   ____________________________________________  EKG   EKG Interpretation  Date/Time:  Wednesday November 07 2017 12:29:47 EDT Ventricular Rate:  99 PR Interval:    QRS Duration: 99 QT Interval:  368 QTC Calculation: 473 R Axis:   86 Text Interpretation:  Sinus rhythm Right atrial enlargement Probable left ventricular hypertrophy ST depr, consider ischemia, inferior leads Minimal ST elevation, anterolateral leads Confirmed by Alona Bene [phone]) on 11/07/2017 12:52:12 PM       ____________________________________________  RADIOLOGY  Ct Abdomen Pelvis Wo Contrast  Result Date: 11/07/2017 CLINICAL DATA:  52 year old male found down, covered and blood in feces. Abdominal pain. EXAM: CT ABDOMEN AND PELVIS WITHOUT CONTRAST TECHNIQUE: Multidetector CT imaging of the abdomen and pelvis was performed following the standard protocol without IV contrast. COMPARISON:  Lumbar MRI 02/26/2016. FINDINGS: Lower chest: Normal lung bases.  No pericardial or pleural effusion. Hepatobiliary: 7 millimeter gallstone (series 3, image 24). No pericholecystic inflammation. Negative noncontrast liver. No biliary ductal enlargement.  Pancreas: Negative. Spleen:  Negative. Adrenals/Urinary Tract: Normal adrenal glands. Normal noncontrast kidneys and proximal ureters. Diminutive and unremarkable urinary bladder. Stomach/Bowel: Negative rectosigmoid colon. Negative descending and transverse colon. Negative right colon. Normal appendix (series 3, image 54). Negative terminal ileum. No dilated small bowel. The stomach is mildly distended with gas and fluid but otherwise unremarkable. The duodenum crosses midline, but some proximal small bowel loops appear to be in the right upper quadrant. No abdominal free air, free fluid. Vascular/Lymphatic: Vascular patency is not evaluated in the absence of IV contrast. No lymphadenopathy. Reproductive: Negative. Other: No pelvic free fluid. Musculoskeletal: The chronic lumbar disc and endplate degeneration. No acute osseous abnormality identified. No superficial soft tissue injury. IMPRESSION: 1. Negative noncontrast CT Abdomen and Pelvis aside from cholelithiasis. 2. Chronic lower lumbar spine degeneration. Electronically Signed   By: Odessa FlemingH  Hall M.D.   On: 11/07/2017 15:38   Ct Head Wo Contrast  Result Date: 11/07/2017 CLINICAL DATA:  Altered mental status. Fall. Recent crack cocaine use EXAM: CT HEAD WITHOUT CONTRAST TECHNIQUE: Contiguous axial images were obtained from the base of the skull through the vertex without intravenous contrast. COMPARISON:  None. FINDINGS: Brain: The ventricles are normal in size and configuration. There is no intracranial mass, hemorrhage, extra-axial fluid collection, or midline shift. Gray-white compartments appear normal. No acute infarct evident. Slight basal ganglia calcification is likely physiologic. Vascular: There is no hyperdense vessel. There is slight calcification in the left carotid siphon region. Skull: Bony calvarium appears intact. Sinuses/Orbits: There is mucosal thickening in several ethmoid air cells. Other visualized paranasal sinuses are clear. Visualized orbits appear symmetric  bilaterally. Other: Mastoid air cells are clear. IMPRESSION: Slight arterial vascular calcification. Mucosal thickening in several ethmoid air cells. Study otherwise unremarkable. Electronically Signed   By: Bretta BangWilliam  Woodruff III M.D.   On: 11/07/2017 15:33   Dg Chest Portable 1 View  Result Date: 11/07/2017 CLINICAL DATA:  Shortness of breath and altered mental status EXAM: PORTABLE CHEST 1 VIEW COMPARISON:  March 14, 2017 FINDINGS: No edema or consolidation. The heart size and pulmonary vascularity are normal. No adenopathy. No pneumothorax. No bone lesions. IMPRESSION: No edema or consolidation. Electronically Signed   By: Bretta BangWilliam  Woodruff III M.D.   On: 11/07/2017 13:05    ____________________________________________   PROCEDURES  Procedure(s) performed:   .Critical Care  Performed by: Maia PlanLong, Rocky Rishel G, MD  Authorized by: Maia PlanLong, Dorraine Ellender G, MD   Critical care provider statement:    Critical care time (minutes):  35   Critical care time was exclusive of:  Separately billable procedures and treating other patients and teaching time   Critical care was necessary to treat or prevent imminent or life-threatening deterioration of the following conditions:  Circulatory failure, CNS failure or compromise, renal failure and shock   Critical care was time spent personally by me on the following activities:  Blood draw for specimens, development of treatment plan with patient or surrogate, discussions with consultants, evaluation of patient's response to treatment, examination of patient, obtaining history from patient or surrogate, ordering and performing treatments and interventions, ordering and review of laboratory studies, ordering and review of radiographic studies, pulse oximetry, re-evaluation of patient's condition and review of old charts   I assumed direction of critical care for this patient from another provider in my specialty: no    .Marland Kitchen.Laceration Repair  Date/Time: 11/07/2017 4:52 PM   Performed by: Maia PlanLong, Aveion Nguyen G, MD  Authorized by: Maia PlanLong, Princess Karnes G, MD   Consent:  Consent obtained:  Verbal   Consent given by:  Patient   Risks discussed:  Infection, need for additional repair, nerve damage, pain, poor cosmetic result, poor wound healing, retained foreign body, tendon damage and vascular damage   Alternatives discussed:  No treatment Anesthesia (see MAR for exact dosages):    Anesthesia method:  Local infiltration   Local anesthetic:  Lidocaine 1% w/o epi Laceration details:    Location:  Shoulder/arm   Shoulder/arm location:  R elbow   Length (cm):  3 Repair type:    Repair type:  Simple Pre-procedure details:    Preparation:  Patient was prepped and draped in usual sterile fashion Exploration:    Hemostasis achieved with:  Direct pressure   Wound exploration: wound explored through full range of motion and entire depth of wound probed and visualized     Wound extent: no foreign bodies/material noted, no muscle damage noted, no nerve damage noted, no tendon damage noted, no underlying fracture noted and no vascular damage noted     Contaminated: no   Treatment:    Area cleansed with:  Shur-Clens   Amount of cleaning:  Standard   Irrigation solution:  Sterile saline   Irrigation method:  Pressure wash   Visualized foreign bodies/material removed: no   Skin repair:    Repair method:  Sutures   Suture size:  3-0   Suture material:  Prolene   Suture technique:  Simple interrupted   Number of sutures:  3 Approximation:    Approximation:  Close Post-procedure details:    Dressing:  Bulky dressing   Patient tolerance of procedure:  Tolerated well, no immediate complications     ____________________________________________   INITIAL IMPRESSION / ASSESSMENT AND PLAN / ED COURSE  Pertinent labs & imaging results that were available during my care of the patient were reviewed by me and considered in my medical decision making (see chart for  details).  Patient presents with altered mental status in the setting of hypotension and polysubstance abuse last night.  He has no signs of head trauma.  He does have a small, 1 cm, static laceration to the right arm.  His EKG is concerning with diffuse ST depressions.  He is not complaining of chest pain at this time.  Suspect EKG changes are from his hypotension rather than primary cardiac event but will follow troponin and repeat EKG when blood pressure and heart rate improve.   03:35 PM Patient chest x-ray reviewed with no acute findings.  His initial lactate was 10 and broad-spectrum antibiotics were initiated along with aggressive IV fluids.  Lower suspicion for sepsis but will cover with antibiotics. Suspect that with the renal failure that may be contributing to his lactic acidosis and clinically he is very dehydrated.  Blood pressure improving rapidly with IV fluid administration.  He does have a slight elevated troponin of 0.26 with EKG changes described above.  I discussed this case with Dr. Jacinto Halim who will come to evaluate the patient but doubt a primary cardiac issue. Patient sent for CT head and CT abdomen/pelvis with continued complaint of lower back pain. Low back pain is a chronic issue for him.   CT negative. Patient mental status improving. Will admit for continued IVF hydration.   Discussed patient's case with Hospitalist to request admission. Patient and family (if present) updated with plan. Care transferred to Hospitalist service.  I reviewed all nursing notes, vitals, pertinent old records, EKGs, labs, imaging (as available).  ____________________________________________  FINAL  CLINICAL IMPRESSION(S) / ED DIAGNOSES  Final diagnoses:  Hypotension, unspecified hypotension type  Acute renal failure, unspecified acute renal failure type (HCC)  Altered mental status, unspecified altered mental status type  Lactic acidosis     MEDICATIONS GIVEN DURING THIS  VISIT:  Medications  metroNIDAZOLE (FLAGYL) IVPB 500 mg (500 mg Intravenous New Bag/Given 11/07/17 2135)  ceFEPIme (MAXIPIME) 1 g in sodium chloride 0.9 % 100 mL IVPB (has no administration in time range)  vancomycin (VANCOCIN) 1,250 mg in sodium chloride 0.9 % 250 mL IVPB (has no administration in time range)  HYDROcodone-acetaminophen (NORCO) 10-325 MG per tablet 1 tablet (1 tablet Oral Given 11/07/17 2005)  0.9 %  sodium chloride infusion ( Intravenous New Bag/Given 11/07/17 1732)  polyethylene glycol (MIRALAX / GLYCOLAX) packet 17 g (has no administration in time range)  ondansetron (ZOFRAN) tablet 4 mg (has no administration in time range)    Or  ondansetron (ZOFRAN) injection 4 mg (has no administration in time range)  thiamine (VITAMIN B-1) tablet 100 mg (100 mg Oral Given 11/07/17 2004)  folic acid (FOLVITE) tablet 1 mg (1 mg Oral Given 11/07/17 2004)  heparin injection 5,000 Units (5,000 Units Subcutaneous Given 11/07/17 2136)  sodium chloride 0.9 % bolus 2,000 mL (0 mLs Intravenous Stopped 11/07/17 1624)  ceFEPIme (MAXIPIME) 2 g in sodium chloride 0.9 % 100 mL IVPB (0 g Intravenous Stopped 11/07/17 1425)  sodium chloride 0.9 % bolus 1,000 mL (1,000 mLs Intravenous New Bag/Given 11/07/17 1626)  vancomycin (VANCOCIN) 1,500 mg in sodium chloride 0.9 % 500 mL IVPB (0 mg Intravenous Stopped 11/07/17 1653)  lidocaine (XYLOCAINE) 2 % (with pres) injection 200 mg (200 mg Infiltration Given 11/07/17 1628)  calcium gluconate 1 g in sodium chloride 0.9 % 100 mL IVPB (1 g Intravenous New Bag/Given 11/07/17 2007)    Note:  This document was prepared using Dragon voice recognition software and may include unintentional dictation errors.  Alona BeneJoshua Malini Flemings, MD Emergency Medicine   Tylor Courtwright, Arlyss RepressJoshua G, MD 11/07/17 2156

## 2017-11-07 NOTE — Consult Note (Signed)
Reason for Consult: Trop elevation Referring Physician: [facility]  Benjamin [name] is an 52 y.o. male.  HPI:   52 y/o [place] American male with polysubstance abuse, hypertension, presented to [facility] ED after being found down in a motel room, confused, inebriated after a night of "partying" that involved heavy use of alcohol and cocaine. He was hypotensive, responding to fluids in th ED. Workup was significant for AKI, lactic acidosis and troponin elevation. Cardiology thus consulted.  On my interview earlier today, patient denied any ongoing chest pain. He endorsed "pain all over" including in his chest, earlier in the day, along with shortness of breath, fever, chills, nausea.   Past Medical History:  Diagnosis Date  . Chronic back pain    managed [facility]  . Erectile dysfunction   . History of substance abuse   . Hx of suicide attempt     Past Surgical History:  Procedure Laterality Date  . ANKLE SURGERY     right  . APPLICATION OF WOUND VAC Right 08/17/2015   Procedure: APPLICATION OF WOUND VAC;  Surgeon: Roseanne Kaufman, MD;  Location: [place];  Service: Orthopedics;  Laterality: Right;  . HAND SURGERY     fracture, ORIG, teenage years, right.  . I&D EXTREMITY Right 08/10/2015   Procedure: IRRIGATION AND DEBRIDEMENT SKIN, SUBCUTANEOS TISSUE AND  MUSCLE, CARPAL TUNNEL RELEASE, MEDIAN NERVE LYSIS WITH NERVE WRAP;  Surgeon: Roseanne Kaufman, MD;  Location: [place];  Service: Orthopedics;  Laterality: Right;  . INCISION AND DRAINAGE OF WOUND Right 08/15/2015   Procedure: IRRIGATION AND DEBRIDEMENT WOUND;  Surgeon: Justice Britain, MD;  Location: [facility];  Service: Orthopedics;  Laterality: Right;  . LUMBAR EPIDURAL INJECTION     [facility]  . SKIN SPLIT GRAFT Right 08/17/2015   Procedure: I&D RIGHT FOREARM/POSSIBLE SKIN GRAFT;  Surgeon: Roseanne Kaufman, MD;  Location: [facility];  Service: Orthopedics;  Laterality: Right;  . TENDON REPAIR N/A 07/24/2015    Procedure: WRIST LACERATION AND TENDON REPAIR;  Surgeon: Iran Planas, MD;  Location: [facility];  Service: Orthopedics;  Laterality: N/A;  . WOUND EXPLORATION Right 08/15/2015   Procedure: WOUND EXPLORATION right forearm;  Surgeon: Justice Britain, MD;  Location[facility];  Service: Orthopedics;  Laterality: Right;    Family History  Problem Relation Age of Onset  . Cancer Mother        lung  . Anxiety disorder Mother   . Cancer Father        throat  . Diabetes Paternal Grandmother   . Cancer Paternal Aunt   . Heart disease Neg Hx   . Stroke Neg Hx     Social History:  reports that he has been smoking cigarettes. He has a 3.00 pack-year smoking history. He has never used smokeless tobacco. He reports that he drinks alcohol. He reports that he has current or past drug history. Drugs: Marijuana and Cocaine.  Allergies: No Known Allergies  Medications: I have reviewed the patient's current medications.  Results for orders placed or performed during the hospital encounter of 11/07/17 (from the past 48 hour(s))  Comprehensive metabolic panel     Status: Abnormal   Collection Time: 11/07/17 12:47 PM  Result Value Ref Range   Sodium 141 135 - 145 mmol/L   Potassium 3.3 (L) 3.5 - 5.1 mmol/L   Chloride 106 98 - 111 mmol/L   CO2 16 (L) 22 - 32 mmol/L   Glucose, Bld 150 (H) 70 - 99  mg/dL   BUN 23 (H) 6 - 20 mg/dL   Creatinine, Ser 3.24 (H) 0.61 - 1.24 mg/dL   Calcium 8.9 8.9 - 10.3 mg/dL   Total Protein 5.7 (L) 6.5 - 8.1 g/dL   Albumin 4.0 3.5 - 5.0 g/dL   AST 52 (H) 15 - 41 U/L   ALT 30 0 - 44 U/L   Alkaline Phosphatase 35 (L) 38 - 126 U/L   Total Bilirubin 3.5 (H) 0.3 - 1.2 mg/dL   GFR calc non Af Amer 21 (L) >60 mL/min   GFR calc Af Amer 24 (L) >60 mL/min    Comment: (NOTE) The eGFR has been calculated using the CKD EPI equation. This calculation has not been validated in all clinical situations. eGFR's persistently <60 mL/min signify possible Chronic Kidney Disease.    Anion gap 19  (H) 5 - 15    Comment: Performed at [facility], [place] [address] [place], [place] [address]  Ethanol     Status: None   Collection Time: 11/07/17 12:47 PM  Result Value Ref Range   Alcohol, Ethyl (B) <10 <10 mg/dL    Comment: (NOTE) Lowest detectable limit for serum alcohol is 10 mg/dL. For medical purposes only. Performed at [facility], [place] [address], [place], [place] [address]   Lipase, blood     Status: None   Collection Time: 11/07/17 12:47 PM  Result Value Ref Range   Lipase 31 11 - 51 U/L    Comment: Performed at [facility] [address], [place], [place] [address]  Troponin I     Status: Abnormal   Collection Time: 11/07/17 12:47 PM  Result Value Ref Range   Troponin I 0.26 (HH) <0.03 ng/mL    Comment: CRITICAL RESULT CALLED TO, READ BACK BY AND VERIFIED WITH: Brooke Pace RN 1740 11/07/2017 BY A BENNETT Performed at [facility], [place] [address] [place], [place] [address]   CBC with Differential     Status: Abnormal   Collection Time: 11/07/17 12:47 PM  Result Value Ref Range   WBC 15.6 (H) 4.0 - 10.5 K/uL   RBC 4.33 4.22 - 5.81 MIL/uL   Hemoglobin 10.5 (L) 13.0 - 17.0 g/dL   HCT 33.7 (L) 39.0 - 52.0 %   MCV 77.8 (L) 78.0 - 100.0 fL   MCH 24.2 (L) 26.0 - 34.0 pg   MCHC 31.2 30.0 - 36.0 g/dL   RDW 15.6 (H) 11.5 - 15.5 %   Platelets 215 150 - 400 K/uL   Neutrophils Relative % 82 %   Neutro Abs 12.9 (H) 1.7 - 7.7 K/uL   Lymphocytes Relative 11 %   Lymphs Abs 1.7 0.7 - 4.0 K/uL   Monocytes Relative 5 %   Monocytes Absolute 0.7 0.1 - 1.0 K/uL   Eosinophils Relative 0 %   Eosinophils Absolute 0.0 0.0 - 0.7 K/uL   Basophils Relative 0 %   Basophils Absolute 0.0 0.0 - 0.1 K/uL   Immature Granulocytes 2 %   Abs Immature Granulocytes 0.3 (H) 0.0 - 0.1 K/uL    Comment: Performed at [facility], [address], [place], [place] [address]  Protime-INR     Status: Abnormal   Collection Time: 11/07/17 12:47 PM  Result Value Ref Range    Prothrombin Time 15.5 (H) 11.4 - 15.2 seconds   INR 1.24     Comment: Performed at [facility] [address] [place], [place]  [address]  CK     Status: Abnormal   Collection Time: 11/07/17 12:47 PM  Result Value Ref Range   Total CK 869 (H) 49 - 397 U/L    Comment: Performed at [facility], [place] [address] [place], [place] [address]  Magnesium     Status: None   Collection Time: 11/07/17 12:47 PM  Result Value Ref Range   Magnesium 2.4 1.7 - 2.4 mg/dL    Comment: Performed at [facility], [place] [address], [place], [place] [address]  Ammonia     Status: None   Collection Time: 11/07/17 12:48 PM  Result Value Ref Range   Ammonia 25 9 - 35 umol/L    Comment: Performed at [facility], [place] [address], [place], [place] [address]  I-Stat CG4 Lactic Acid, ED     Status: Abnormal   Collection Time: 11/07/17 12:55 PM  Result Value Ref Range   Lactic Acid, Venous 10.23 (HH) 0.5 - 1.9 mmol/L   Comment NOTIFIED PHYSICIAN   I-stat Chem 8, ED     Status: Abnormal   Collection Time: 11/07/17 12:55 PM  Result Value Ref Range   Sodium 137 135 - 145 mmol/L   Potassium 3.7 3.5 - 5.1 mmol/L   Chloride 106 98 - 111 mmol/L   BUN 24 (H) 6 - 20 mg/dL   Creatinine, Ser 3.20 (H) 0.61 - 1.24 mg/dL   Glucose, Bld 174 (H) 70 - 99 mg/dL   Calcium, Ion 0.97 (L) 1.15 - 1.40 mmol/L   TCO2 14 (L) 22 - 32 mmol/L   Hemoglobin 11.9 (L) 13.0 - 17.0 g/dL   HCT 35.0 (L) 39.0 - 52.0 %  CBG monitoring, ED     Status: Abnormal   Collection Time: 11/07/17  1:00 PM  Result Value Ref Range   Glucose-Capillary 148 (H) 70 - 99 mg/dL  I-Stat CG4 Lactic Acid, ED     Status: Abnormal   Collection Time: 11/07/17  2:55 PM  Result Value Ref Range   Lactic Acid, Venous 8.71 (HH) 0.5 - 1.9 mmol/L   Comment NOTIFIED PHYSICIAN     Ct Abdomen Pelvis Wo Contrast  Result Date: 11/07/2017 CLINICAL DATA:  52 year old male found down, covered and blood in feces. Abdominal pain. EXAM: CT ABDOMEN AND  PELVIS WITHOUT CONTRAST TECHNIQUE: Multidetector CT imaging of the abdomen and pelvis was performed following the standard protocol without IV contrast. COMPARISON:  Lumbar MRI 02/26/2016. FINDINGS: Lower chest: Normal lung bases.  No pericardial or pleural effusion. Hepatobiliary: 7 millimeter gallstone (series 3, image 24). No pericholecystic inflammation. Negative noncontrast liver. No biliary ductal enlargement. Pancreas: Negative. Spleen: Negative. Adrenals/Urinary Tract: Normal adrenal glands. Normal noncontrast kidneys and proximal ureters. Diminutive and unremarkable urinary bladder. Stomach/Bowel: Negative rectosigmoid colon. Negative descending and transverse colon. Negative right colon. Normal appendix (series 3, image 54). Negative terminal ileum. No dilated small bowel. The stomach is mildly distended with gas and fluid but otherwise unremarkable. The duodenum crosses midline, but some proximal small bowel loops appear to be in the right upper quadrant. No abdominal free air, free fluid. Vascular/Lymphatic: Vascular patency is not evaluated in the absence of IV contrast. No lymphadenopathy. Reproductive: Negative. Other: No pelvic free fluid. Musculoskeletal: The chronic lumbar disc and endplate degeneration. No acute osseous abnormality identified. No superficial soft tissue injury. IMPRESSION: 1. Negative noncontrast CT Abdomen and Pelvis aside from cholelithiasis. 2. Chronic lower lumbar spine degeneration. Electronically Signed   By: Herminio Heads.D.  On: 11/07/2017 15:38   Ct Head Wo Contrast  Result Date: 11/07/2017 CLINICAL DATA:  Altered mental status. Fall. Recent crack cocaine use EXAM: CT HEAD WITHOUT CONTRAST TECHNIQUE: Contiguous axial images were obtained from the base of the skull through the vertex without intravenous contrast. COMPARISON:  None. FINDINGS: Brain: The ventricles are normal in size and configuration. There is no intracranial mass, hemorrhage, extra-axial fluid collection,  or midline shift. Gray-white compartments appear normal. No acute infarct evident. Slight basal ganglia calcification is likely physiologic. Vascular: There is no hyperdense vessel. There is slight calcification in the left carotid siphon region. Skull: Bony calvarium appears intact. Sinuses/Orbits: There is mucosal thickening in several ethmoid air cells. Other visualized paranasal sinuses are clear. Visualized orbits appear symmetric bilaterally. Other: Mastoid air cells are clear. IMPRESSION: Slight arterial vascular calcification. Mucosal thickening in several ethmoid air cells. Study otherwise unremarkable. Electronically Signed   By: Lowella Grip III M.D.   On: 11/07/2017 15:33   Dg Chest Portable 1 View  Result Date: 11/07/2017 CLINICAL DATA:  Shortness of breath and altered mental status EXAM: PORTABLE CHEST 1 VIEW COMPARISON:  March 14, 2017 FINDINGS: No edema or consolidation. The heart size and pulmonary vascularity are normal. No adenopathy. No pneumothorax. No bone lesions. IMPRESSION: No edema or consolidation. Electronically Signed   By: Lowella Grip III M.D.   On: 11/07/2017 13:05    Review of Systems  Constitutional: Positive for chills, fever and malaise/fatigue.  HENT: Negative.   Eyes: Negative.   Respiratory: Positive for shortness of breath.   Cardiovascular: Positive for chest pain. Negative for leg swelling.  Gastrointestinal: Positive for nausea. Negative for abdominal pain.  Genitourinary: Negative.   Musculoskeletal:       Pain all over  Neurological: Positive for loss of consciousness.  Endo/Heme/Allergies: Does not bruise/bleed easily.  Psychiatric/Behavioral: The patient is nervous/anxious.   All other systems reviewed and are negative.  Blood pressure 139/77, pulse 94, temperature 97.7 F (36.5 C), temperature source Rectal, resp. rate 18, height 6' 2"  (1.88 m), weight 90.7 kg, SpO2 100 %. Physical Exam  Nursing note and vitals  reviewed. Constitutional: He is oriented to person, place, and time. He appears well-developed and well-nourished.  Appears jittery  HENT:  Head: Atraumatic.  Neck: Normal range of motion. Neck supple. No JVD present.  Cardiovascular: Normal rate, regular rhythm and normal heart sounds.  No murmur heard. Respiratory: Effort normal. He has no wheezes.  GI: Soft. Bowel sounds are normal. There is no tenderness.  Musculoskeletal: He exhibits no edema.  Lymphadenopathy:    He has no cervical adenopathy.  Neurological: He is oriented to person, place, and time.  Skin: Skin is warm and dry.  Psychiatric:  Anxious    Cardiac studies: EKG  11/07/2017: Sinus rhythm with diffuse early repolarization changes  Assessment/Recommednations:  52 y/o [place] male hypertension, admitted with metabolic acidosis, AKI after intoxication of polysubstance abuse  Troponin elevation: Cocaine abuse can cause diffuse vasospasm. Also, could be type 2 MI in the setting of severe metabolic derangement. Other differential is rhabdomyolysis given that he was found down after losing consciousness for unknown period of time. No indication for emergent angiography at this time. Will consider ischemic evaluation after acute illness over.  AKI, metabolic acidosis: Likely related to polysubstance abuse, dehydration.   Rest of the management per the primary team.   Manish J Patwardhan 11/07/2017, 4:53 PM   Manish Esther Hardy, MD [facility] [place] Pager: [phone] Office: [phone] If  no answer Cell [phone]

## 2017-11-07 NOTE — Progress Notes (Signed)
Admission note:  Arrival Method: Patient arrived from ED. Mental Orientation:  Alert and oriented x 4. Telemetry: 46M-02, NSR Assessment: See doc flow sheets. Skin: Intact except [facility] doctor addressed it, has dressing. IV: Left and Right AC. Pain: Denies any pain. Tubes: N/A Safety Measures: Bed in low position, bed alarm activated, call light and phone within reach. Fall Prevention Safety Plan: Reviewed the plan, verbalized understanding but not compliant. Admission Screening: In process.  6700 Orientation: Patient has been oriented to the unit, staff and to the room.

## 2017-11-07 NOTE — ED Triage Notes (Signed)
Patient BIB [facility] for altered mental status and laceration to RIGHT arm. Patient reports drinking ETOH and smoking crack last night. States "I woke up to take a shower this morning and fell, hit my lower back and hurt my arm". Per EMS patient was found at a hotel by the cleaning service, laying in the bed covered in blood and feces. Patient denies hitting head or LOC. His only complaint at this time is lower back and RIGHT arm pain. A&O x 4.   SBP 60 HR 110

## 2017-11-07 NOTE — Progress Notes (Signed)
Patient took off his heart monitor and went into the bathroom even after telling him not to remove the monitor.

## 2017-11-08 ENCOUNTER — Inpatient Hospital Stay (HOSPITAL_COMMUNITY): Payer: BLUE CROSS/BLUE SHIELD

## 2017-11-08 ENCOUNTER — Inpatient Hospital Stay (HOSPITAL_COMMUNITY)
Admit: 2017-11-08 | Discharge: 2017-11-08 | Disposition: A | Discharge status: Home / Self Care | Payer: BLUE CROSS/BLUE SHIELD | Attending: Internal Medicine | Admitting: Internal Medicine

## 2017-11-08 DIAGNOSIS — I959 Hypotension, unspecified: Secondary | ICD-10-CM

## 2017-11-08 DIAGNOSIS — E872 Acidosis: Secondary | ICD-10-CM

## 2017-11-08 DIAGNOSIS — I340 Nonrheumatic mitral (valve) insufficiency: Secondary | ICD-10-CM

## 2017-11-08 LAB — BASIC METABOLIC PANEL
ANION GAP: 8 (ref 5–15)
BUN: 30 mg/dL — ABNORMAL HIGH (ref 6–20)
CHLORIDE: 110 mmol/L (ref 98–111)
CO2: 22 mmol/L (ref 22–32)
Calcium: 7.7 mg/dL — ABNORMAL LOW (ref 8.9–10.3)
Creatinine, Ser: 2.82 mg/dL — ABNORMAL HIGH (ref 0.61–1.24)
GFR calc Af Amer: 28 mL/min — ABNORMAL LOW (ref >60)
GFR, EST NON AFRICAN AMERICAN: 25 mL/min — AB (ref >60)
Glucose, Bld: 128 mg/dL — ABNORMAL HIGH (ref 70–99)
POTASSIUM: 3.8 mmol/L (ref 3.5–5.1)
SODIUM: 140 mmol/L (ref 135–145)

## 2017-11-08 LAB — LACTIC ACID, PLASMA
Lactic Acid, Venous: 2.9 mmol/L (ref 0.5–1.9)
Lactic Acid, Venous: 1.4 mmol/L (ref 0.5–1.9)

## 2017-11-08 LAB — TROPONIN I
TROPONIN I: 0.77 ng/mL — AB (ref <0.03)
Troponin I: 0.56 ng/mL (ref <0.03)

## 2017-11-08 LAB — HEMOGLOBIN AND HEMATOCRIT, BLOOD
HCT: 21.3 % — ABNORMAL LOW (ref 39.0–52.0)
HCT: 20.1 % — ABNORMAL LOW (ref 39.0–52.0)
HEMATOCRIT: 20.6 % — AB (ref 39.0–52.0)
Hemoglobin: 6.6 g/dL — CL (ref 13.0–17.0)
Hemoglobin: 6.9 g/dL — CL (ref 13.0–17.0)
Hemoglobin: 6.6 g/dL — CL (ref 13.0–17.0)

## 2017-11-08 LAB — CBC
HEMATOCRIT: 21.5 % — AB (ref 39.0–52.0)
HEMOGLOBIN: 6.9 g/dL — AB (ref 13.0–17.0)
MCH: 24.2 pg — ABNORMAL LOW (ref 26.0–34.0)
MCHC: 32.1 g/dL (ref 30.0–36.0)
MCV: 75.4 fL — ABNORMAL LOW (ref 78.0–100.0)
Platelets: 137 10*3/uL — ABNORMAL LOW (ref 150–400)
RBC: 2.85 MIL/uL — AB (ref 4.22–5.81)
RDW: 15.2 % (ref 11.5–15.5)
WBC: 11.7 10*3/uL — AB (ref 4.0–10.5)

## 2017-11-08 LAB — URINE CULTURE
Culture: NO GROWTH
Special Requests: NORMAL

## 2017-11-08 LAB — HIV ANTIBODY (ROUTINE TESTING W REFLEX): HIV Screen 4th Generation wRfx: NONREACTIVE

## 2017-11-08 LAB — TSH: TSH: 0.476 u[IU]/mL (ref 0.350–4.500)

## 2017-11-08 LAB — ECHOCARDIOGRAM COMPLETE
Height: 74 in
Weight: 3199.32 oz

## 2017-11-08 LAB — PROCALCITONIN: PROCALCITONIN: 0.27 ng/mL

## 2017-11-08 LAB — CK: Total CK: 632 U/L — ABNORMAL HIGH (ref 49–397)

## 2017-11-08 LAB — ABO/RH: ABO/RH(D): A POS

## 2017-11-08 LAB — PREPARE RBC (CROSSMATCH)

## 2017-11-08 MED ORDER — HYDROCODONE-ACETAMINOPHEN 10-325 MG PO TABS
1.0000 | ORAL_TABLET | ORAL | Status: DC | PRN
  Administered 2017-11-08 – 2017-11-13 (×27): 1 via ORAL
  Filled 2017-11-08 (×27): qty 1

## 2017-11-08 MED ORDER — SODIUM CHLORIDE 0.9 % IV SOLN
1.0000 g | INTRAVENOUS | Status: DC
  Administered 2017-11-08 – 2017-11-10 (×2): 1 g via INTRAVENOUS
  Filled 2017-11-08 (×3): qty 10

## 2017-11-08 MED ORDER — SODIUM CHLORIDE 0.9% IV SOLUTION: Freq: Once | INTRAVENOUS | Status: DC

## 2017-11-08 NOTE — Progress Notes (Signed)
PROGRESS NOTE    Benjamin [name]  AVW:[id number] DOB: [birth year] DOA: 11/07/2017 PCP: Jac Canavan, PA-C   Brief Narrative: Benjamin [name] is a 52 y.o. male with medical history significant of chronic pain, HTN, and substance abuse was brought to ED, after he was found minimally responsive in the bed room of a hotel with feces. He reports taht he was partying with friends and occasionally uses cocaine.  On arrival to he was hypotensive, tachycardic and in altered mental status. He was admitted for further evaluation.    Assessment & Plan:   Active Problems:   Chronic back pain   Cocaine abuse (HCC)   Essential hypertension   Chronic kidney disease (CKD), stage III (moderate) (HCC)   AKI (acute kidney injury) (HCC)   Hypotension   Leukocytosis   Acute metabolic encephalopathy: secondary to ARF.  Resolved.pt appears to be back to baseline.  EEG on admission does not show any seizure activity..   Acute on stage 3 CKD:  Probably pre renal azotemia from dehydration.  hdyrate and repeat renal parameters in am.  UA does not show any infection. CT abd does not show any hydronephrosis.  Baseline creatinine at 1.3 , on admission was 3.24> 3.20>2.80. He will need further treatment with iv FLUIDS for another 48 hours.    Substance use disorder:  Pt UDS was positive for cocaine.   H/o anxiety, depression and PTSD with insomnia and nightmares: As per the fiance at bedside, he is being weaned of Trintellix, and is not taking prozac.  Psychiatry consulted for meds and recommendations.  He denies any suicidal ideation at this time.    Hypotension resolved with fluid bolus.   Hypertension: bp parameters are better today.   Metabolic acidosis: improved.    Rhabdomyolysis:  Non traumatic, pt reports persistent body aches and his CK levels still elevated.    Elevated troponin and lactic acid : Elevated troponin probably from demand ischemia from cocaine use and dehydration.  echocardiogram ordered, which showed normal LVEF and no regional wall abnormalities.  Repeat EKG ordered to check for ischemic changes.  Elevated lactic acid from dehydration.  Trend lactate. Rule out infection.    Pt reports some dizziness, chest heaviness on ambulation and sob: Unclear if he aspirated.  He was empirically started on IV antibiotics for aspiration pneumonia. He has been afebrile so far.  Elevated wbc count on admission improving . Trend pro calcitonin.    Anemia:  Unclear etiology.  Appears microcytic.  His H&H was 11.9 yesterday and repeat level was around 6 twice. He had a laceration to the elbow which was bandaged and there is no more bleeding from it.  Anemia panel ordered.  Stool for occult blood ordered.  1 unit of prbc transfusion ordered.  Transfuse to keep hemoglobin greater than 7.  Pt's fiance at bedside reports he occasionally uses meloxicam for back pain.     Chronic pain syndrome: On hydrocodone every 4 hours prn for the back pain.         DVT prophylaxis: sq heparin.   Code Status:  Full code.  Family Communication: fiance at bedside.  Disposition Plan: pending clinical improvement.    Consultants:   Psychiatry.   Procedures:Echocardiogram.   Antimicrobials: ( rocephin and flagyl for possible aspiration pneumonia.   Subjective: Reports feeling a little better, but complaints of chest pressure on walking. And generalized body aches.   Objective: Vitals:   11/07/17 1722 11/07/17 2056 11/08/17 0605 11/08/17 0817  BP:  136/79 (!) 141/69 138/70  Pulse:  90 78 79  Resp:  18 18 18   Temp:  98.8 F (37.1 C) 98.4 F (36.9 C) (!) 97.5 F (36.4 C)  TempSrc:  Oral Oral Oral  SpO2:  100% 99% 99%  Weight: 90.7 kg 90.7 kg    Height: 6\' 2"  (1.88 m)       Intake/Output Summary (Last 24 hours) at 11/08/2017 1240 Last data filed at 11/08/2017 1026 Gross per 24 hour  Intake 540 ml  Output 0 ml  Net 540 ml   Filed Weights   11/07/17  1236 11/07/17 1722 11/07/17 2056  Weight: 90.7 kg 90.7 kg 90.7 kg    Examination:  General exam: Appears calm and comfortable  Respiratory system: Clear to auscultation. Respiratory effort normal. Cardiovascular system: S1 & S2 heard, RRR. No JVD, murmurs, rubs, gallops or clicks. No pedal edema. Gastrointestinal system: Abdomen is nondistended, soft and nontender. No organomegaly or masses felt. Normal bowel sounds heard. Central nervous system: Alert and oriented. No focal neurological deficits. Extremities: Symmetric 5 x 5 power. Skin: No rashes, lesions or ulcers Psychiatry: . Mood & affect appropriate.     Data Reviewed: I have personally reviewed following labs and imaging studies  CBC: Recent Labs  Lab 11/07/17 1247 11/07/17 1255 11/08/17 0308 11/08/17 0613  WBC 15.6*  --  11.7*  --   NEUTROABS 12.9*  --   --   --   HGB 10.5* 11.9* 6.9* 6.6*  HCT 33.7* 35.0* 21.5* 20.6*  MCV 77.8*  --  75.4*  --   PLT 215  --  137*  --    Basic Metabolic Panel: Recent Labs  Lab 11/07/17 1247 11/07/17 1255 11/08/17 0308  NA 141 137 140  K 3.3* 3.7 3.8  CL 106 106 110  CO2 16*  --  22  GLUCOSE 150* 174* 128*  BUN 23* 24* 30*  CREATININE 3.24* 3.20* 2.82*  CALCIUM 8.9  --  7.7*  MG 2.4  --   --    GFR: Estimated Creatinine Clearance: 36.4 mL/min (A) (by C-G formula based on SCr of 2.82 mg/dL (H)). Liver Function Tests: Recent Labs  Lab 11/07/17 1247  AST 52*  ALT 30  ALKPHOS 35*  BILITOT 3.5*  PROT 5.7*  ALBUMIN 4.0   Recent Labs  Lab 11/07/17 1247  LIPASE 31   Recent Labs  Lab 11/07/17 1248  AMMONIA 25   Coagulation Profile: Recent Labs  Lab 11/07/17 1247  INR 1.24   Cardiac Enzymes: Recent Labs  Lab 11/07/17 1247 11/07/17 1727 11/07/17 2305 11/08/17 0308 11/08/17 1026  CKTOTAL 869*  --   --   --  632*  TROPONINI 0.26* 0.43* 0.56* 0.77*  --    BNP (last 3 results) No results for input(s): PROBNP in the last 8760 hours. HbA1C: No results  for input(s): HGBA1C in the last 72 hours. CBG: Recent Labs  Lab 11/07/17 1300  GLUCAP 148*   Lipid Profile: No results for input(s): CHOL, HDL, LDLCALC, TRIG, CHOLHDL, LDLDIRECT in the last 72 hours. Thyroid Function Tests: Recent Labs    11/08/17 0308  TSH 0.476   Anemia Panel: No results for input(s): VITAMINB12, FOLATE, FERRITIN, TIBC, IRON, RETICCTPCT in the last 72 hours. Sepsis Labs: Recent Labs  Lab 11/07/17 1455 11/07/17 1727 11/07/17 2006 11/08/17 0308 11/08/17 1026  PROCALCITON  --  0.12  --  0.27  --   LATICACIDVEN 8.71* 4.5* 3.2*  --  2.9*    Recent  Results (from the past 240 hour(s))  Culture, blood (routine x 2)     Status: None (Preliminary result)   Collection Time: 11/07/17  1:37 PM  Result Value Ref Range Status   Specimen Description BLOOD LEFT FOREARM  Final   Special Requests   Final    BOTTLES DRAWN AEROBIC AND ANAEROBIC Blood Culture results may not be optimal due to an inadequate volume of blood received in culture bottles   Culture   Final    NO GROWTH < 24 HOURS Performed at [facility], [address] [place], [place] [address]    Report Status PENDING  Incomplete  Culture, blood (routine x 2)     Status: None (Preliminary result)   Collection Time: 11/07/17  2:48 PM  Result Value Ref Range Status   Specimen Description BLOOD RIGHT HAND  Final   Special Requests   Final    BOTTLES DRAWN AEROBIC AND ANAEROBIC Blood Culture adequate volume   Culture   Final    NO GROWTH < 24 HOURS Performed at [facility], [address] [place], [place] [address]    Report Status PENDING  Incomplete  Urine culture     Status: None   Collection Time: 11/07/17  4:20 PM  Result Value Ref Range Status   Specimen Description URINE, CLEAN CATCH  Final   Special Requests Normal  Final   Culture   Final    NO GROWTH Performed at [facility], [address] [place], [place] [address]    Report Status 11/08/2017 FINAL  Final          Radiology Studies: Ct Abdomen Pelvis Wo Contrast  Result Date: 11/07/2017 CLINICAL DATA:  52 year old male found down, covered and blood in feces. Abdominal pain. EXAM: CT ABDOMEN AND PELVIS WITHOUT CONTRAST TECHNIQUE: Multidetector CT imaging of the abdomen and pelvis was performed following the standard protocol without IV contrast. COMPARISON:  Lumbar MRI 02/26/2016. FINDINGS: Lower chest: Normal lung bases.  No pericardial or pleural effusion. Hepatobiliary: 7 millimeter gallstone (series 3, image 24). No pericholecystic inflammation. Negative noncontrast liver. No biliary ductal enlargement. Pancreas: Negative. Spleen: Negative. Adrenals/Urinary Tract: Normal adrenal glands. Normal noncontrast kidneys and proximal ureters. Diminutive and unremarkable urinary bladder. Stomach/Bowel: Negative rectosigmoid colon. Negative descending and transverse colon. Negative right colon. Normal appendix (series 3, image 54). Negative terminal ileum. No dilated small bowel. The stomach is mildly distended with gas and fluid but otherwise unremarkable. The duodenum crosses midline, but some proximal small bowel loops appear to be in the right upper quadrant. No abdominal free air, free fluid. Vascular/Lymphatic: Vascular patency is not evaluated in the absence of IV contrast. No lymphadenopathy. Reproductive: Negative. Other: No pelvic free fluid. Musculoskeletal: The chronic lumbar disc and endplate degeneration. No acute osseous abnormality identified. No superficial soft tissue injury. IMPRESSION: 1. Negative noncontrast CT Abdomen and Pelvis aside from cholelithiasis. 2. Chronic lower lumbar spine degeneration. Electronically Signed   By: Odessa FlemingH  Hall M.D.   On: 11/07/2017 15:38   Ct Head Wo Contrast  Result Date: 11/07/2017 CLINICAL DATA:  Altered mental status. Fall. Recent crack cocaine use EXAM: CT HEAD WITHOUT CONTRAST TECHNIQUE: Contiguous axial images were obtained from the base of the skull through the  vertex without intravenous contrast. COMPARISON:  None. FINDINGS: Brain: The ventricles are normal in size and configuration. There is no intracranial mass, hemorrhage, extra-axial fluid collection, or midline shift. Gray-white compartments appear normal. No acute infarct evident. Slight basal ganglia  calcification is likely physiologic. Vascular: There is no hyperdense vessel. There is slight calcification in the left carotid siphon region. Skull: Bony calvarium appears intact. Sinuses/Orbits: There is mucosal thickening in several ethmoid air cells. Other visualized paranasal sinuses are clear. Visualized orbits appear symmetric bilaterally. Other: Mastoid air cells are clear. IMPRESSION: Slight arterial vascular calcification. Mucosal thickening in several ethmoid air cells. Study otherwise unremarkable. Electronically Signed   By: Bretta BangWilliam  Woodruff III M.D.   On: 11/07/2017 15:33   Dg Chest Portable 1 View  Result Date: 11/07/2017 CLINICAL DATA:  Shortness of breath and altered mental status EXAM: PORTABLE CHEST 1 VIEW COMPARISON:  March 14, 2017 FINDINGS: No edema or consolidation. The heart size and pulmonary vascularity are normal. No adenopathy. No pneumothorax. No bone lesions. IMPRESSION: No edema or consolidation. Electronically Signed   By: Bretta BangWilliam  Woodruff III M.D.   On: 11/07/2017 13:05        Scheduled Meds: . sodium chloride   Intravenous Once  . folic acid  1 mg Oral Daily  . heparin  5,000 Units Subcutaneous Q8H  . thiamine  100 mg Oral Daily   Continuous Infusions: . sodium chloride 100 mL/hr at 11/08/17 0426  . ceFEPime (MAXIPIME) IV 1 g (11/08/17 1202)  . metronidazole 500 mg (11/08/17 0529)  . vancomycin 1,250 mg (11/08/17 1005)     LOS: 1 day    Time spent: 45 min    Kathlen ModyVijaya Zaydin Billey, MD [facility] Pager [phone] If 7PM-7AM, please contact night-coverage [email or web address] Password [email or web address] 11/08/2017, 12:40 PM

## 2017-11-08 NOTE — Progress Notes (Signed)
  Echocardiogram 2D Echocardiogram has been performed.  Benjamin SavoyCasey N Kern [name] 11/08/2017, 3:22 PM

## 2017-11-08 NOTE — Procedures (Signed)
ELECTROENCEPHALOGRAM REPORT   Patient: Benjamin HaagensenJohn [name]       Room #: 5M03C EEG No. ID: [id number] Age: 52 y.o.        Sex: male Referring Physician: Benjamine MolaVann Report Date:  11/08/2017        Interpreting Physician: Thana FarrEYNOLDS, Kahealani Yankovich  History: Benjamin HaagensenJohn Zettel is an 52 y.o. male with a history of polysubstance abuse found down  Medications:  Maxipime, Folvite, Flagyl, B1, Vancomycin  Conditions of Recording:  This is a 21 channel routine scalp EEG performed with bipolar and monopolar montages arranged in accordance to the international 10/20 system of electrode placement. One channel was dedicated to EKG recording.  The patient is in the awake, drowsy and asleep state.  Description:  The waking background activity consists of a low voltage, symmetrical, fairly well organized, 9 Hz alpha activity, seen from the parieto-occipital and posterior temporal regions.  Low voltage fast activity, poorly organized, is seen anteriorly and is at times superimposed on more posterior regions.  A mixture of theta and alpha rhythms are seen from the central and temporal regions. The patient drowses with slowing to irregular, low voltage theta and beta activity.   The patient goes in to a light sleep with symmetrical sleep spindles, vertex central sharp transients and irregular slow activity.  No epileptiform activity is noted.   Hyperventilation was not performed.  Intermittent photic stimulation was performed but failed to illicit any change in the tracing.   IMPRESSION: Normal electroencephalogram, awake, asleep and with activation procedures. There are no focal lateralizing or epileptiform features.   Thana FarrLeslie Duell Holdren, MD Neurology [phone] 11/08/2017, 10:43 AM

## 2017-11-08 NOTE — Progress Notes (Signed)
EEG complete -results pending,. 

## 2017-11-08 NOTE — Progress Notes (Signed)
Critical value HGB 6.9 [facility] paged. Ilean SkillVeronica Meridian Scherger LPN

## 2017-11-08 NOTE — Plan of Care (Signed)
  Problem: Clinical Measurements: Goal: Ability to maintain clinical measurements within normal limits will improve Outcome: Progressing   Problem: Pain Managment: Goal: General experience of comfort will improve Outcome: Progressing   

## 2017-11-08 NOTE — Progress Notes (Signed)
I asked patient ,if it is alright for his Benjamin [name] Benjamin [name] to get some his medical information and the patient readily agreed to it.M.d.made aware.

## 2017-11-09 DIAGNOSIS — F17210 Nicotine dependence, cigarettes, uncomplicated: Secondary | ICD-10-CM

## 2017-11-09 DIAGNOSIS — F1410 Cocaine abuse, uncomplicated: Secondary | ICD-10-CM | POA: Diagnosis present

## 2017-11-09 LAB — IRON AND TIBC
IRON: 78 ug/dL (ref 45–182)
Saturation Ratios: 32 % (ref 17.9–39.5)
TIBC: 241 ug/dL — ABNORMAL LOW (ref 250–450)
UIBC: 163 ug/dL

## 2017-11-09 LAB — CBC
HEMATOCRIT: 23.2 % — AB (ref 39.0–52.0)
Hemoglobin: 7.5 g/dL — ABNORMAL LOW (ref 13.0–17.0)
MCH: 25.9 pg — AB (ref 26.0–34.0)
MCHC: 32.3 g/dL (ref 30.0–36.0)
MCV: 80 fL (ref 78.0–100.0)
Platelets: 105 10*3/uL — ABNORMAL LOW (ref 150–400)
RBC: 2.9 MIL/uL — ABNORMAL LOW (ref 4.22–5.81)
RDW: 16.3 % — AB (ref 11.5–15.5)
WBC: 5.1 10*3/uL (ref 4.0–10.5)

## 2017-11-09 LAB — RETICULOCYTES
RBC.: 2.73 MIL/uL — AB (ref 4.22–5.81)
RETIC CT PCT: 2.5 % (ref 0.4–3.1)
Retic Count, Absolute: 68.3 10*3/uL (ref 19.0–186.0)

## 2017-11-09 LAB — BASIC METABOLIC PANEL
Anion gap: 4 — ABNORMAL LOW (ref 5–15)
BUN: 21 mg/dL — AB (ref 6–20)
CALCIUM: 7.4 mg/dL — AB (ref 8.9–10.3)
CHLORIDE: 113 mmol/L — AB (ref 98–111)
CO2: 24 mmol/L (ref 22–32)
CREATININE: 2.34 mg/dL — AB (ref 0.61–1.24)
GFR calc non Af Amer: 31 mL/min — ABNORMAL LOW (ref >60)
GFR, EST AFRICAN AMERICAN: 36 mL/min — AB (ref >60)
GLUCOSE: 95 mg/dL (ref 70–99)
Potassium: 3.5 mmol/L (ref 3.5–5.1)
Sodium: 141 mmol/L (ref 135–145)

## 2017-11-09 LAB — VITAMIN B12: VITAMIN B 12: 245 pg/mL (ref 180–914)

## 2017-11-09 LAB — PREPARE RBC (CROSSMATCH)

## 2017-11-09 LAB — PROCALCITONIN: Procalcitonin: <0.1 ng/mL

## 2017-11-09 LAB — FOLATE: Folate: 13.9 ng/mL (ref >5.9)

## 2017-11-09 LAB — FERRITIN: Ferritin: 142 ng/mL (ref 24–336)

## 2017-11-09 MED ORDER — SENNOSIDES-DOCUSATE SODIUM 8.6-50 MG PO TABS
1.0000 | ORAL_TABLET | Freq: Two times a day (BID) | ORAL | Status: DC
  Administered 2017-11-09 – 2017-11-12 (×5): 1 via ORAL
  Filled 2017-11-09 (×9): qty 1

## 2017-11-09 MED ORDER — SODIUM CHLORIDE 0.9% IV SOLUTION
Freq: Once | INTRAVENOUS | Status: AC
  Administered 2017-11-09: 10:00:00 via INTRAVENOUS

## 2017-11-09 NOTE — Plan of Care (Signed)
  Problem: Clinical Measurements: Goal: Cardiovascular complication will be avoided Outcome: Progressing   Problem: Pain Managment: Goal: General experience of comfort will improve 11/08/2017 1943 by Luther Redourgott, Reita Shindler, RN Outcome: Progressing   Problem: Clinical Measurements: Goal: Ability to maintain clinical measurements within normal limits will improve 11/09/2017 0255 by Luther Redourgott, Iram Lundberg, RN Outcome: Progressing 11/08/2017 1943 by Luther Redourgott, Tynasia Mccaul, RN Outcome: Progressing

## 2017-11-09 NOTE — Progress Notes (Signed)
PROGRESS NOTE    Benjamin [name]  QMV:[id number] DOB: 1965-08-19 DOA: 11/07/2017 PCP: Jac Canavan, PA-C   Brief Narrative: Benjamin [name] is a 52 y.o. male with medical history significant of chronic pain, HTN, and substance abuse was brought to ED, after he was found minimally responsive in the bed room of a hotel with feces. He reports taht he was partying with friends and occasionally uses cocaine.  On arrival to he was hypotensive, tachycardic and in altered mental status. He was admitted for further evaluation.    Assessment & Plan:   Active Problems:   Chronic back pain   Cocaine abuse (HCC)   Essential hypertension   Chronic kidney disease (CKD), stage III (moderate) (HCC)   AKI (acute kidney injury) (HCC)   Hypotension   Leukocytosis   Acute metabolic encephalopathy: secondary to ARF.  Resolved.pt appears to be back to baseline.  EEG on admission does not show any seizure activity..   Acute on stage 3 CKD:  Probably pre renal azotemia from dehydration.  hdyrate and repeat renal parameters in am show improvement.  UA does not show any infection. CT abd does not show any hydronephrosis.  Baseline creatinine at 1.3 , on admission was 3.24> 3.20>2.80>2.34. He will need further treatment with iv FLUIDS for another 48 hours. Continue to monitor.  Monitor urine output.   Substance use disorder:  Pt UDS was positive for cocaine. Social work consulted for resources.   H/o anxiety, depression and PTSD with insomnia and nightmares: As per the fiance at bedside, he is being weaned of Trintellix, and is not taking prozac.  Psychiatry consulted for meds and recommendations.  He denies any suicidal ideation at this time.    Hypotension resolved with fluid bolus.   Hypertension: bp parameters are better today.   Metabolic acidosis: improved.    Rhabdomyolysis:  Non traumatic, pt reports persistent body aches and his CK levels still elevated.    Elevated troponin  and lactic acid : Elevated troponin probably from demand ischemia from cocaine use and dehydration. echocardiogram ordered, which showed normal LVEF and no regional wall abnormalities.  Repeat EKG ordered does not show ischemic changes.  Elevated lactic acid from dehydration.  Lactic acid normalized.    Pt reports some dizziness, chest heaviness on ambulation and sob: Unclear if he aspirated.  He was empirically started on IV antibiotics for aspiration pneumonia. He has been afebrile so far.  Elevated wbc count on admission improving . Trend pro calcitonin. Improving.    Anemia:  Unclear etiology.  Appears microcytic.  He had a laceration to the elbow which was bandaged and there is no more bleeding from it.  Anemia panel ordered.  Stool for occult blood ordered.  2 unit of prbc transfusion ordered. Repeat H&H I greater than 7. Transfuse to keep hemoglobin greater than 7.  Pt's fiance at bedside reports he occasionally uses meloxicam for back pain. Pt denies any bleeding per rectum and black stools.     Chronic pain syndrome: On hydrocodone every 4 hours prn for the back pain.         DVT prophylaxis: sq heparin.   Code Status:  Full code.  Family Communication: fiance at bedside.  Disposition Plan: pending clinical improvement.    Consultants:   Psychiatry.   Procedures:Echocardiogram.   Antimicrobials: ( rocephin and flagyl for possible aspiration pneumonia.   Subjective: Reports chest pressure is better, no sob or nausea, vomiting or abdominal pain.   Objective: Vitals:  11/09/17 0932 11/09/17 0946 11/09/17 1001 11/09/17 1355  BP: 131/76 131/76 118/66 129/66  Pulse: 63 63 70 67  Resp:  18 18 18   Temp: 98.2 F (36.8 C) 98.2 F (36.8 C) 98.2 F (36.8 C) 98.3 F (36.8 C)  TempSrc: Oral Oral Oral Oral  SpO2: 100% 100% 99% 100%  Weight:      Height:        Intake/Output Summary (Last 24 hours) at 11/09/2017 1518 Last data filed at 11/09/2017  1400 Gross per 24 hour  Intake 999.25 ml  Output 1975 ml  Net -975.75 ml   Filed Weights   11/07/17 1722 11/07/17 2056 11/08/17 2104  Weight: 90.7 kg 90.7 kg 90.8 kg    Examination:  General exam: Appears calm, not in distress.  Respiratory system: Clear to auscultation. Respiratory effort normal. No wheezing or rhonchi.  Cardiovascular system: S1 & S2 heard, RRR. No JVD, . No pedal edema. Gastrointestinal system: Abdomen is soft NT nd bs+ Central nervous system: Alert and oriented. Non focal.  Extremities: Symmetric 5 x 5 power. Skin: No rashes, lesions or ulcers Psychiatry: . Mood & affect appropriate.     Data Reviewed: I have personally reviewed following labs and imaging studies  CBC: Recent Labs  Lab 11/07/17 1247 11/07/17 1255 11/08/17 0308 11/08/17 0613 11/08/17 1841 11/08/17 2152  WBC 15.6*  --  11.7*  --   --   --   NEUTROABS 12.9*  --   --   --   --   --   HGB 10.5* 11.9* 6.9* 6.6* 6.9* 6.6*  HCT 33.7* 35.0* 21.5* 20.6* 21.3* 20.1*  MCV 77.8*  --  75.4*  --   --   --   PLT 215  --  137*  --   --   --    Basic Metabolic Panel: Recent Labs  Lab 11/07/17 1247 11/07/17 1255 11/08/17 0308 11/09/17 0512  NA 141 137 140 141  K 3.3* 3.7 3.8 3.5  CL 106 106 110 113*  CO2 16*  --  22 24  GLUCOSE 150* 174* 128* 95  BUN 23* 24* 30* 21*  CREATININE 3.24* 3.20* 2.82* 2.34*  CALCIUM 8.9  --  7.7* 7.4*  MG 2.4  --   --   --    GFR: Estimated Creatinine Clearance: 43.9 mL/min (A) (by C-G formula based on SCr of 2.34 mg/dL (H)). Liver Function Tests: Recent Labs  Lab 11/07/17 1247  AST 52*  ALT 30  ALKPHOS 35*  BILITOT 3.5*  PROT 5.7*  ALBUMIN 4.0   Recent Labs  Lab 11/07/17 1247  LIPASE 31   Recent Labs  Lab 11/07/17 1248  AMMONIA 25   Coagulation Profile: Recent Labs  Lab 11/07/17 1247  INR 1.24   Cardiac Enzymes: Recent Labs  Lab 11/07/17 1247 11/07/17 1727 11/07/17 2305 11/08/17 0308 11/08/17 1026  CKTOTAL 869*  --   --   --   632*  TROPONINI 0.26* 0.43* 0.56* 0.77*  --    BNP (last 3 results) No results for input(s): PROBNP in the last 8760 hours. HbA1C: No results for input(s): HGBA1C in the last 72 hours. CBG: Recent Labs  Lab 11/07/17 1300  GLUCAP 148*   Lipid Profile: No results for input(s): CHOL, HDL, LDLCALC, TRIG, CHOLHDL, LDLDIRECT in the last 72 hours. Thyroid Function Tests: Recent Labs    11/08/17 0308  TSH 0.476   Anemia Panel: Recent Labs    11/08/17 2152 11/09/17 0512  VITAMINB12  --  245  FOLATE 13.9  --   FERRITIN  --  142  TIBC  --  241*  IRON  --  78  RETICCTPCT  --  2.5   Sepsis Labs: Recent Labs  Lab 11/07/17 1727 11/07/17 2006 11/08/17 0308 11/08/17 1026 11/08/17 1841 11/09/17 0512  PROCALCITON 0.12  --  0.27  --   --  <0.10  LATICACIDVEN 4.5* 3.2*  --  2.9* 1.4  --     Recent Results (from the past 240 hour(s))  Culture, blood (routine x 2)     Status: None (Preliminary result)   Collection Time: 11/07/17  1:37 PM  Result Value Ref Range Status   Specimen Description BLOOD LEFT FOREARM  Final   Special Requests   Final    BOTTLES DRAWN AEROBIC AND ANAEROBIC Blood Culture results may not be optimal due to an inadequate volume of blood received in culture bottles   Culture   Final    NO GROWTH 2 DAYS Performed at [facility], [address] [place], [place] [address]    Report Status PENDING  Incomplete  Culture, blood (routine x 2)     Status: None (Preliminary result)   Collection Time: 11/07/17  2:48 PM  Result Value Ref Range Status   Specimen Description BLOOD RIGHT HAND  Final   Special Requests   Final    BOTTLES DRAWN AEROBIC AND ANAEROBIC Blood Culture adequate volume   Culture   Final    NO GROWTH 2 DAYS Performed at [facility], [address] [place], [place] [address]    Report Status PENDING  Incomplete  Urine culture     Status: None   Collection Time: 11/07/17  4:20 PM  Result Value Ref Range Status   Specimen  Description URINE, CLEAN CATCH  Final   Special Requests Normal  Final   Culture   Final    NO GROWTH Performed at [facility], [address] [place], [place] [address]    Report Status 11/08/2017 FINAL  Final         Radiology Studies: Ct Abdomen Pelvis Wo Contrast  Result Date: 11/07/2017 CLINICAL DATA:  52 year old male found down, covered and blood in feces. Abdominal pain. EXAM: CT ABDOMEN AND PELVIS WITHOUT CONTRAST TECHNIQUE: Multidetector CT imaging of the abdomen and pelvis was performed following the standard protocol without IV contrast. COMPARISON:  Lumbar MRI 02/26/2016. FINDINGS: Lower chest: Normal lung bases.  No pericardial or pleural effusion. Hepatobiliary: 7 millimeter gallstone (series 3, image 24). No pericholecystic inflammation. Negative noncontrast liver. No biliary ductal enlargement. Pancreas: Negative. Spleen: Negative. Adrenals/Urinary Tract: Normal adrenal glands. Normal noncontrast kidneys and proximal ureters. Diminutive and unremarkable urinary bladder. Stomach/Bowel: Negative rectosigmoid colon. Negative descending and transverse colon. Negative right colon. Normal appendix (series 3, image 54). Negative terminal ileum. No dilated small bowel. The stomach is mildly distended with gas and fluid but otherwise unremarkable. The duodenum crosses midline, but some proximal small bowel loops appear to be in the right upper quadrant. No abdominal free air, free fluid. Vascular/Lymphatic: Vascular patency is not evaluated in the absence of IV contrast. No lymphadenopathy. Reproductive: Negative. Other: No pelvic free fluid. Musculoskeletal: The chronic lumbar disc and endplate degeneration. No acute osseous abnormality identified. No superficial soft tissue injury. IMPRESSION: 1. Negative noncontrast CT Abdomen and Pelvis aside from cholelithiasis. 2. Chronic lower lumbar spine degeneration. Electronically Signed   By: Odessa FlemingH  Hall M.D.   On: 11/07/2017 15:38  Ct Head  Wo Contrast  Result Date: 11/07/2017 CLINICAL DATA:  Altered mental status. Fall. Recent crack cocaine use EXAM: CT HEAD WITHOUT CONTRAST TECHNIQUE: Contiguous axial images were obtained from the base of the skull through the vertex without intravenous contrast. COMPARISON:  None. FINDINGS: Brain: The ventricles are normal in size and configuration. There is no intracranial mass, hemorrhage, extra-axial fluid collection, or midline shift. Gray-white compartments appear normal. No acute infarct evident. Slight basal ganglia calcification is likely physiologic. Vascular: There is no hyperdense vessel. There is slight calcification in the left carotid siphon region. Skull: Bony calvarium appears intact. Sinuses/Orbits: There is mucosal thickening in several ethmoid air cells. Other visualized paranasal sinuses are clear. Visualized orbits appear symmetric bilaterally. Other: Mastoid air cells are clear. IMPRESSION: Slight arterial vascular calcification. Mucosal thickening in several ethmoid air cells. Study otherwise unremarkable. Electronically Signed   By: Bretta Bang III M.D.   On: 11/07/2017 15:33        Scheduled Meds: . sodium chloride   Intravenous Once  . folic acid  1 mg Oral Daily  . heparin  5,000 Units Subcutaneous Q8H  . senna-docusate  1 tablet Oral BID  . thiamine  100 mg Oral Daily   Continuous Infusions: . sodium chloride 100 mL/hr at 11/09/17 1150  . cefTRIAXone (ROCEPHIN)  IV 1 g (11/08/17 2352)  . metronidazole 500 mg (11/09/17 1436)     LOS: 2 days    Time spent: 45 min    Kathlen Mody, MD [facility] Pager [phone] If 7PM-7AM, please contact night-coverage [email or web address] Password [facility] 11/09/2017, 3:18 PM

## 2017-11-09 NOTE — Consult Note (Signed)
[facility] Consult   Reason for Consult:  Medication management  Referring Physician:  Dr. Karleen Hampshire Patient Identification: Benjamin [name] MRN:  [id number] Principal Diagnosis: Cocaine use disorder [facility]) Diagnosis:   Patient Active Problem List   Diagnosis Date Noted  . AKI (acute kidney injury) ([place]) [N17.9] 11/07/2017  . Hypotension [I95.9] 11/07/2017  . Leukocytosis [D72.829] 11/07/2017  . Chronic kidney disease (CKD), stage III (moderate) ([place]) [N18.3] 09/19/2016  . Elevated LFTs [R94.5] 09/19/2016  . MDD (major depressive disorder), recurrent episode, severe ([place]) [F33.2] 08/24/2015  . Insomnia [G47.00]   . MDD (major depressive disorder), single episode, severe , no psychosis ([place]) [F32.2] 08/14/2015  . Cannabis use disorder, mild, abuse [F12.10] 08/14/2015  . Cocaine abuse ([place]) [F14.10] 08/10/2015  . Weight loss, non-intentional [R63.4] 08/10/2015  . Depression [F32.9] 08/10/2015  . Essential hypertension [I10]   . Vaccine refused by patient [Z28.20] 05/24/2015  . Routine general medical examination at a health care facility [Z00.00] 05/24/2015  . Chronic back pain [M54.9, G89.29] 05/24/2015  . Family history of cancer [Z80.9] 05/24/2015  . Screening for prostate cancer [Z12.5] 05/24/2015  . Smoker [F17.200] 05/24/2015  . Screen for colon cancer [Z12.11] 05/24/2015  . Erectile dysfunction [N52.9] 01/31/2014    Total Time spent with patient: 1 hour  Subjective:   Benjamin [name] is a 52 y.o. male patient admitted with acute metabolic encephalopathy secondary to ARF.  HPI:  Patient was admitted with acute metabolic encephalopathy secondary to ARF. He was found unresponsive in his hotel bed with feces. He reported that he was partying with friends. He reports that he occasionally uses cocaine. UDS was positive for cocaine and BAL was negative on admission. He has a history of anxiety, depression and PTSD. Per fiance, he had suicidal thoughts earlier this week.  He denies SI since hospitalization. He has also had manic episodes with insomnia and nightmares and was paranoid earlier this week. He did not take his psychiatric medications for 5 days and was missing since Saturday. Home medications include Prozac 40 mg daily, Remeron 7.5-15 mg qhs and Trintellix 20 mg daily. He is not taking Prozac and is being weaned off Trintellix. He has a history of chronic pain. Per PMP, he receives Norco 10-325 mg q 6 hours PRN. He also has a recent prescription for Restoril 15 mg daily that was filled on 7/22.   On interview, Benjamin [name] reports onset of paranoia at the beginning of July after watching the movie [place].  He reports having nightmares about his suicide attempt after watching this movie.  He reports that he personally knows the cast and was incarcerated with them in the past.  He also reports that he had thoughts that people were treating him differently at work because they possibly knew his past history of incarceration.  He later reports that he has insecurities that others will treat him differently based on his legal history.  He reports that he knows that these thoughts are irrational now.  He believes that his prior paranoia was related to multiple medication changes.  He reports that he has a new psychiatrist at the [facility].  His psychiatrist discontinued Trintellix and started Abilify.  He was also tried on Trazodone for sleep and it worsened his nightmares.  He has not taken any psychotropic medications since Saturday.  His fiance was present at bedside with his verbal consent.  She reports that he appeared to be manic because he was having difficulty sleeping and almost  psychotic.  She reports that at baseline he does not appear to be sleeping comfortably because he moans in his sleep and often moves.  He reports relapsing on cocaine although he does not quantify his use.  He also endorses onset of SI due to substance use and feeling guilty  about his use.  He denies current SI, HI or AVH.  He denies a history of psychosis.  He reports that his mood is improved today.  He reports fair sleep in the hospital due to multiple interruptions in sleep for medications.  He reports no problems with his appetite.   Past Psychiatric History: MDD, anxiety and PTSD. History of suicide attempt by cutting. He reports a history of multiple suicide attempts as a teenager.   Risk to Self:  None. Denies SI.  Risk to Others:  None. Denies HI. Prior Inpatient Therapy:  He was hospitalized at [facility] in 08/2015 for depression with suicide attempt by cutting.  Prior Outpatient Therapy:  Prior medications include Zoloft 150 mg daily, Trazodone 50 mg qhs PRN and Atarax 50 mg q 6 hours PRN.   Past Medical History:  Past Medical History:  Diagnosis Date  . Chronic back pain    managed [facility]  . CKD (chronic kidney disease), stage III (B and E) 10/2017  . Erectile dysfunction   . History of substance abuse   . Hx of suicide attempt   . Hypertension     Past Surgical History:  Procedure Laterality Date  . ANKLE SURGERY     right  . APPLICATION OF WOUND VAC Right 08/17/2015   Procedure: APPLICATION OF WOUND VAC;  Surgeon: Roseanne Kaufman, MD;  Location: [facility];  Service: Orthopedics;  Laterality: Right;  . HAND SURGERY     fracture, ORIG, teenage years, right.  . I&D EXTREMITY Right 08/10/2015   Procedure: IRRIGATION AND DEBRIDEMENT SKIN, SUBCUTANEOS TISSUE AND  MUSCLE, CARPAL TUNNEL RELEASE, MEDIAN NERVE LYSIS WITH NERVE WRAP;  Surgeon: Roseanne Kaufman, MD;  Location: [facility];  Service: Orthopedics;  Laterality: Right;  . INCISION AND DRAINAGE OF WOUND Right 08/15/2015   Procedure: IRRIGATION AND DEBRIDEMENT WOUND;  Surgeon: Justice Britain, MD;  Location: [facility];  Service: Orthopedics;  Laterality: Right;  . LUMBAR EPIDURAL INJECTION     [facility]  . SKIN SPLIT GRAFT Right 08/17/2015   Procedure: I&D RIGHT FOREARM/POSSIBLE  SKIN GRAFT;  Surgeon: Roseanne Kaufman, MD;  Location: [facility];  Service: Orthopedics;  Laterality: Right;  . TENDON REPAIR N/A 07/24/2015   Procedure: WRIST LACERATION AND TENDON REPAIR;  Surgeon: Iran Planas, MD;  Location: [facility];  Service: Orthopedics;  Laterality: N/A;  . WOUND EXPLORATION Right 08/15/2015   Procedure: WOUND EXPLORATION right forearm;  Surgeon: Justice Britain, MD;  Location[facility];  Service: Orthopedics;  Laterality: Right;   Family History:  Family History  Problem Relation Age of Onset  . Cancer Mother        lung  . Anxiety disorder Mother   . Cancer Father        throat  . Diabetes Paternal Grandmother   . Cancer Paternal Aunt   . Heart disease Neg Hx   . Stroke Neg Hx    Family Psychiatric  History: His mother had mental illness and was hospitalized. He is unsure of her diagnosis but reports that she had a "nervous breakdown."   Social History:  Social History   Substance and Sexual Activity  Alcohol Use Yes  . Alcohol/week:  0.0 standard drinks   Comment: occasional     Social History   Substance and Sexual Activity  Drug Use Yes  . Types: Marijuana, Cocaine   Comment: last use cocaine 08/08/15, last use MJ 08/08/15    Social History   Socioeconomic History  . Marital status: Single    Spouse name: Not on file  . Number of children: Not on file  . Years of education: Not on file  . Highest education level: Not on file  Occupational History  . Not on file  Social Needs  . Financial resource strain: Not on file  . Food insecurity:    Worry: Not on file    Inability: Not on file  . Transportation needs:    Medical: Not on file    Non-medical: Not on file  Tobacco Use  . Smoking status: Current Every Day Smoker    Packs/day: 0.50    Years: 6.00    Pack years: 3.00    Types: Cigarettes  . Smokeless tobacco: Never Used  Substance and Sexual Activity  . Alcohol use: Yes    Alcohol/week: 0.0 standard drinks    Comment: occasional  . Drug  use: Yes    Types: Marijuana, Cocaine    Comment: last use cocaine 08/08/15, last use MJ 08/08/15  . Sexual activity: Not on file  Lifestyle  . Physical activity:    Days per week: Not on file    Minutes per session: Not on file  . Stress: Not on file  Relationships  . Social connections:    Talks on phone: Not on file    Gets together: Not on file    Attends religious service: Not on file    Active member of club or organization: Not on file    Attends meetings of clubs or organizations: Not on file    Relationship status: Not on file  Other Topics Concern  . Not on file  Social History Narrative   Single,prior worked as Geophysicist/field seismologist at [employer].   No children.  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   09/2017.    Additional Social History: He lives at home with his fiance and 2 dogs. 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 He was hired through a Chief Strategy Officer. He reports a history of crack cocaine, heroin and alcohol abuse. He was sober for 2 years and relapsed last month. He was incarcerated in the past.     Allergies:  No Known Allergies  Labs:  Results for orders placed or performed during the hospital encounter of 11/07/17 (from the past 48 hour(s))  Comprehensive metabolic panel     Status: Abnormal   Collection Time: 11/07/17 12:47 PM  Result Value Ref Range   Sodium 141 135 - 145 mmol/L   Potassium 3.3 (L) 3.5 - 5.1 mmol/L   Chloride 106 98 - 111 mmol/L   CO2 16 (L) 22 - 32 mmol/L   Glucose, Bld 150 (H) 70 - 99 mg/dL   BUN 23 (H) 6 - 20 mg/dL   Creatinine, Ser 3.24 (H) 0.61 - 1.24 mg/dL   Calcium 8.9 8.9 - 10.3 mg/dL   Total Protein 5.7 (L) 6.5 - 8.1 g/dL   Albumin 4.0 3.5 - 5.0 g/dL   AST 52 (H) 15 - 41 U/L   ALT 30 0 - 44 U/L   Alkaline Phosphatase 35 (L) 38 - 126 U/L   Total Bilirubin 3.5 (H) 0.3 -  1.2 mg/dL   GFR calc non Af Amer 21 (L) >60 mL/min   GFR calc Af Amer 24 (L) >60 mL/min    Comment:  (NOTE) The eGFR has been calculated using the CKD EPI equation. This calculation has not been validated in all clinical situations. eGFR's persistently <60 mL/min signify possible Chronic Kidney Disease.    Anion gap 19 (H) 5 - 15    Comment: Performed at [facility], [place] [address], [place], [place] [address]  Ethanol     Status: None   Collection Time: 11/07/17 12:47 PM  Result Value Ref Range   Alcohol, Ethyl (B) <10 <10 mg/dL    Comment: (NOTE) Lowest detectable limit for serum alcohol is 10 mg/dL. For medical purposes only. Performed at [facility], [place] [address], [place], [place] [address]   Lipase, blood     Status: None   Collection Time: 11/07/17 12:47 PM  Result Value Ref Range   Lipase 31 11 - 51 U/L    Comment: Performed at [facility] [address] [place], [place] [address]  Troponin I     Status: Abnormal   Collection Time: 11/07/17 12:47 PM  Result Value Ref Range   Troponin I 0.26 (HH) <0.03 ng/mL    Comment: CRITICAL RESULT CALLED TO, READ BACK BY AND VERIFIED WITH: Brooke Pace RN 7169 11/07/2017 BY A BENNETT Performed at [facility], [place] [address], [place], [place] [address]   CBC with Differential     Status: Abnormal   Collection Time: 11/07/17 12:47 PM  Result Value Ref Range   WBC 15.6 (H) 4.0 - 10.5 K/uL   RBC 4.33 4.22 - 5.81 MIL/uL   Hemoglobin 10.5 (L) 13.0 - 17.0 g/dL   HCT 33.7 (L) 39.0 - 52.0 %   MCV 77.8 (L) 78.0 - 100.0 fL   MCH 24.2 (L) 26.0 - 34.0 pg   MCHC 31.2 30.0 - 36.0 g/dL   RDW 15.6 (H) 11.5 - 15.5 %   Platelets 215 150 - 400 K/uL   Neutrophils Relative % 82 %   Neutro Abs 12.9 (H) 1.7 - 7.7 K/uL   Lymphocytes Relative 11 %   Lymphs Abs 1.7 0.7 - 4.0 K/uL   Monocytes Relative 5 %   Monocytes Absolute 0.7 0.1 - 1.0 K/uL   Eosinophils Relative 0 %   Eosinophils Absolute 0.0 0.0 - 0.7 K/uL   Basophils Relative 0 %   Basophils Absolute 0.0 0.0 - 0.1 K/uL   Immature Granulocytes 2 %   Abs Immature  Granulocytes 0.3 (H) 0.0 - 0.1 K/uL    Comment: Performed at [facility], [address], [place], [place] [address]  Protime-INR     Status: Abnormal   Collection Time: 11/07/17 12:47 PM  Result Value Ref Range   Prothrombin Time 15.5 (H) 11.4 - 15.2 seconds   INR 1.24     Comment: Performed at [facility] [address] [place], [place] [address]  CK     Status: Abnormal   Collection Time: 11/07/17 12:47 PM  Result Value Ref Range   Total CK 869 (H) 49 - 397 U/L    Comment: Performed at [facility], [place] [address], [place], [place] [address]  Magnesium     Status: None   Collection Time: 11/07/17 12:47 PM  Result Value Ref Range   Magnesium 2.4 1.7 - 2.4 mg/dL    Comment: Performed  at [facility], [place] [address] [name], [place] [address]  Ammonia     Status: None   Collection Time: 11/07/17 12:48 PM  Result Value Ref Range   Ammonia 25 9 - 35 umol/L    Comment: Performed at [facility], [place] [address], [place], [place] [address]  I-Stat CG4 Lactic Acid, ED     Status: Abnormal   Collection Time: 11/07/17 12:55 PM  Result Value Ref Range   Lactic Acid, Venous 10.23 (HH) 0.5 - 1.9 mmol/L   Comment NOTIFIED PHYSICIAN   I-stat Chem 8, ED     Status: Abnormal   Collection Time: 11/07/17 12:55 PM  Result Value Ref Range   Sodium 137 135 - 145 mmol/L   Potassium 3.7 3.5 - 5.1 mmol/L   Chloride 106 98 - 111 mmol/L   BUN 24 (H) 6 - 20 mg/dL   Creatinine, Ser 3.20 (H) 0.61 - 1.24 mg/dL   Glucose, Bld 174 (H) 70 - 99 mg/dL   Calcium, Ion 0.97 (L) 1.15 - 1.40 mmol/L   TCO2 14 (L) 22 - 32 mmol/L   Hemoglobin 11.9 (L) 13.0 - 17.0 g/dL   HCT 35.0 (L) 39.0 - 52.0 %  CBG monitoring, ED     Status: Abnormal   Collection Time: 11/07/17  1:00 PM  Result Value Ref Range   Glucose-Capillary 148 (H) 70 - 99 mg/dL  Culture, blood (routine x 2)     Status: None (Preliminary result)   Collection Time: 11/07/17  1:37 PM  Result Value Ref Range   Specimen  Description BLOOD LEFT FOREARM    Special Requests      BOTTLES DRAWN AEROBIC AND ANAEROBIC Blood Culture results may not be optimal due to an inadequate volume of blood received in culture bottles   Culture      NO GROWTH 2 DAYS Performed at [facility], [place] [address] [place], [place] [address]    Report Status PENDING   Culture, blood (routine x 2)     Status: None (Preliminary result)   Collection Time: 11/07/17  2:48 PM  Result Value Ref Range   Specimen Description BLOOD RIGHT HAND    Special Requests      BOTTLES DRAWN AEROBIC AND ANAEROBIC Blood Culture adequate volume   Culture      NO GROWTH 2 DAYS Performed at [facility], [place] [address] [place], [place] [address]    Report Status PENDING   I-Stat CG4 Lactic Acid, ED     Status: Abnormal   Collection Time: 11/07/17  2:55 PM  Result Value Ref Range   Lactic Acid, Venous 8.71 (HH) 0.5 - 1.9 mmol/L   Comment NOTIFIED PHYSICIAN   Urinalysis, Routine w reflex microscopic     Status: Abnormal   Collection Time: 11/07/17  4:20 PM  Result Value Ref Range   Color, Urine AMBER (A) YELLOW    Comment: BIOCHEMICALS MAY BE AFFECTED BY COLOR   APPearance CLOUDY (A) CLEAR   Specific Gravity, Urine 1.023 1.005 - 1.030   pH 5.0 5.0 - 8.0   Glucose, UA NEGATIVE NEGATIVE mg/dL   Hgb urine dipstick MODERATE (A) NEGATIVE   Bilirubin Urine NEGATIVE NEGATIVE   Ketones, ur 5 (A) NEGATIVE mg/dL   Protein, ur 100 (A) NEGATIVE mg/dL   Nitrite NEGATIVE NEGATIVE   Leukocytes, UA NEGATIVE NEGATIVE   RBC / HPF 0-5 0 - 5 RBC/hpf   WBC, UA 0-5 0 - 5  WBC/hpf   Bacteria, UA RARE (A) NONE SEEN   Squamous Epithelial / LPF 0-5 0 - 5   Mucus PRESENT    Hyaline Casts, UA PRESENT    Granular Casts, UA PRESENT    Amorphous Crystal PRESENT     Comment: Performed at [facility], [place] [address] [place], [place] [address]  Urine rapid drug screen (hosp performed)     Status: Abnormal   Collection Time: 11/07/17  4:20 PM  Result  Value Ref Range   Opiates NONE DETECTED NONE DETECTED   Cocaine POSITIVE (A) NONE DETECTED   Benzodiazepines NONE DETECTED NONE DETECTED   Amphetamines NONE DETECTED NONE DETECTED   Tetrahydrocannabinol NONE DETECTED NONE DETECTED   Barbiturates NONE DETECTED NONE DETECTED    Comment: (NOTE) DRUG SCREEN FOR MEDICAL PURPOSES ONLY.  IF CONFIRMATION IS NEEDED FOR ANY PURPOSE, NOTIFY LAB WITHIN 5 DAYS. LOWEST DETECTABLE LIMITS FOR URINE DRUG SCREEN Drug Class                     Cutoff (ng/mL) Amphetamine and metabolites    1000 Barbiturate and metabolites    200 Benzodiazepine                 952 Tricyclics and metabolites     300 Opiates and metabolites        300 Cocaine and metabolites        300 THC                            50 Performed at [facility], [place] [address] [place], [place] [address]   Urine culture     Status: None   Collection Time: 11/07/17  4:20 PM  Result Value Ref Range   Specimen Description URINE, CLEAN CATCH    Special Requests Normal    Culture      NO GROWTH Performed at [facility], [place] [address], [place], [place] [address]    Report Status 11/08/2017 FINAL   HIV antibody (Routine Testing)     Status: None   Collection Time: 11/07/17  5:27 PM  Result Value Ref Range   HIV Screen 4th Generation wRfx Non Reactive Non Reactive    Comment: (NOTE) Performed At: [facility] [place], [place] [id number] Rush Farmer MD IH:[id number]   Troponin I     Status: Abnormal   Collection Time: 11/07/17  5:27 PM  Result Value Ref Range   Troponin I 0.43 (HH) <0.03 ng/mL    Comment: CRITICAL VALUE NOTED.  VALUE IS CONSISTENT WITH PREVIOUSLY REPORTED AND CALLED VALUE. Performed at [facility], [place] [address] [place], [place] [address]   Lactic acid, plasma     Status: Abnormal   Collection Time: 11/07/17  5:27 PM  Result Value Ref Range   Lactic Acid, Venous 4.5 (HH) 0.5 - 1.9 mmol/L    Comment: CRITICAL  RESULT CALLED TO, READ BACK BY AND VERIFIED WITHWynelle Fanny 1924 11/07/17 D BRADLEY Performed at [place] [address] [place],  [address]   Procalcitonin - Baseline     Status: None   Collection Time: 11/07/17  5:27 PM  Result Value Ref Range   Procalcitonin 0.12 ng/mL    Comment:        Interpretation: PCT (Procalcitonin) <= 0.5 ng/mL: Systemic infection (sepsis) is not likely. Local bacterial infection is possible. (NOTE)  Sepsis PCT Algorithm           Lower Respiratory Tract                                      Infection PCT Algorithm    ----------------------------     ----------------------------         PCT < 0.25 ng/mL                PCT < 0.10 ng/mL         Strongly encourage             Strongly discourage   discontinuation of antibiotics    initiation of antibiotics    ----------------------------     -----------------------------       PCT 0.25 - 0.50 ng/mL            PCT 0.10 - 0.25 ng/mL               OR       >80% decrease in PCT            Discourage initiation of                                            antibiotics      Encourage discontinuation           of antibiotics    ----------------------------     -----------------------------         PCT >= 0.50 ng/mL              PCT 0.26 - 0.50 ng/mL               AND        <80% decrease in PCT             Encourage initiation of                                             antibiotics       Encourage continuation           of antibiotics    ----------------------------     -----------------------------        PCT >= 0.50 ng/mL                  PCT > 0.50 ng/mL               AND         increase in PCT                  Strongly encourage                                      initiation of antibiotics    Strongly encourage escalation           of antibiotics                                     -----------------------------  PCT <= 0.25 ng/mL                                                  OR                                        > 80% decrease in PCT                                     Discontinue / Do not initiate                                             antibiotics Performed at [facility], [place] [address] [place], [place] [address]   Lactic acid, plasma     Status: Abnormal   Collection Time: 11/07/17  8:06 PM  Result Value Ref Range   Lactic Acid, Venous 3.2 (HH) 0.5 - 1.9 mmol/L    Comment: CRITICAL RESULT CALLED TO, READ BACK BY AND VERIFIED WITH: BASUNA D,RN 11/07/17 2101 WAYK Performed at [facility], [place] [address], [place], [place] [address]   Troponin I     Status: Abnormal   Collection Time: 11/07/17 11:05 PM  Result Value Ref Range   Troponin I 0.56 (HH) <0.03 ng/mL    Comment: CRITICAL VALUE NOTED.  VALUE IS CONSISTENT WITH PREVIOUSLY REPORTED AND CALLED VALUE. Performed at [facility], [place] [address] [place], [place] [address]   TSH     Status: None   Collection Time: 11/08/17  3:08 AM  Result Value Ref Range   TSH 0.476 0.350 - 4.500 uIU/mL    Comment: Performed by a 3rd Generation assay with a functional sensitivity of <=0.01 uIU/mL. Performed at [facility], [place] [address] [place], [place] [address]   Basic metabolic panel     Status: Abnormal   Collection Time: 11/08/17  3:08 AM  Result Value Ref Range   Sodium 140 135 - 145 mmol/L   Potassium 3.8 3.5 - 5.1 mmol/L   Chloride 110 98 - 111 mmol/L   CO2 22 22 - 32 mmol/L   Glucose, Bld 128 (H) 70 - 99 mg/dL   BUN 30 (H) 6 - 20 mg/dL   Creatinine, Ser 2.82 (H) 0.61 - 1.24 mg/dL   Calcium 7.7 (L) 8.9 - 10.3 mg/dL   GFR calc non Af Amer 25 (L) >60 mL/min   GFR calc Af Amer 28 (L) >60 mL/min    Comment: (NOTE) The eGFR has been calculated using the CKD EPI equation. This calculation has not been validated in all clinical situations. eGFR's persistently <60 mL/min signify possible Chronic Kidney Disease.    Anion  gap 8 5 - 15    Comment: Performed at [facility] [address] [place] [address]  CBC     Status: Abnormal   Collection Time: 11/08/17  3:08 AM  Result Value Ref Range   WBC 11.7 (H) 4.0 - 10.5 K/uL   RBC 2.85 (L) 4.22 - 5.81 MIL/uL  Hemoglobin 6.9 (LL) 13.0 - 17.0 g/dL    Comment: REPEATED TO VERIFY CRITICAL RESULT CALLED TO, READ BACK BY AND VERIFIED WITH: V.BROWN,RN 1761 11/08/17 M.CAMPBELL    HCT 21.5 (L) 39.0 - 52.0 %   MCV 75.4 (L) 78.0 - 100.0 fL   MCH 24.2 (L) 26.0 - 34.0 pg   MCHC 32.1 30.0 - 36.0 g/dL   RDW 15.2 11.5 - 15.5 %   Platelets 137 (L) 150 - 400 K/uL    Comment: Performed at [place] [address] [place], [place] [address]  Troponin I     Status: Abnormal   Collection Time: 11/08/17  3:08 AM  Result Value Ref Range   Troponin I 0.77 (HH) <0.03 ng/mL    Comment: CRITICAL VALUE NOTED.  VALUE IS CONSISTENT WITH PREVIOUSLY REPORTED AND CALLED VALUE. Performed at [facility], [place] [address], [place], [place] [address]   Procalcitonin     Status: None   Collection Time: 11/08/17  3:08 AM  Result Value Ref Range   Procalcitonin 0.27 ng/mL    Comment:        Interpretation: PCT (Procalcitonin) <= 0.5 ng/mL: Systemic infection (sepsis) is not likely. Local bacterial infection is possible. (NOTE)       Sepsis PCT Algorithm           Lower Respiratory Tract                                      Infection PCT Algorithm    ----------------------------     ----------------------------         PCT < 0.25 ng/mL                PCT < 0.10 ng/mL         Strongly encourage             Strongly discourage   discontinuation of antibiotics    initiation of antibiotics    ----------------------------     -----------------------------       PCT 0.25 - 0.50 ng/mL            PCT 0.10 - 0.25 ng/mL               OR       >80% decrease in PCT            Discourage initiation of                                            antibiotics       Encourage discontinuation           of antibiotics    ----------------------------     -----------------------------         PCT >= 0.50 ng/mL              PCT 0.26 - 0.50 ng/mL               AND        <80% decrease in PCT             Encourage initiation of  antibiotics       Encourage continuation           of antibiotics    ----------------------------     -----------------------------        PCT >= 0.50 ng/mL                  PCT > 0.50 ng/mL               AND         increase in PCT                  Strongly encourage                                      initiation of antibiotics    Strongly encourage escalation           of antibiotics                                     -----------------------------                                           PCT <= 0.25 ng/mL                                                 OR                                        > 80% decrease in PCT                                     Discontinue / Do not initiate                                             antibiotics Performed at [facility], [address] [place], [place] [address]   Hemoglobin and hematocrit, blood     Status: Abnormal   Collection Time: 11/08/17  6:13 AM  Result Value Ref Range   Hemoglobin 6.6 (LL) 13.0 - 17.0 g/dL    Comment: REPEATED TO VERIFY CRITICAL VALUE NOTED.  VALUE IS CONSISTENT WITH PREVIOUSLY REPORTED AND CALLED VALUE.    HCT 20.6 (L) 39.0 - 52.0 %    Comment: Performed at [facility], [place] [address] [place], [place] [address]  CK     Status: Abnormal   Collection Time: 11/08/17 10:26 AM  Result Value Ref Range   Total CK 632 (H) 49 - 397 U/L    Comment: Performed at [facility], [place] [address], [place], [place] [address]  Lactic acid, plasma     Status: Abnormal   Collection Time: 11/08/17 10:26 AM  Result Value Ref Range   Lactic Acid, Venous 2.9 (HH) 0.5 - 1.9  mmol/L    Comment: CRITICAL RESULT  CALLED TO, READ BACK BY AND VERIFIED WITH: RIO,A RN @ 1123 11/08/17 LEONARD,A Performed at [facility], [place] [address], [place] [address] [place], [place] [address]   Prepare RBC     Status: None   Collection Time: 11/08/17 11:18 AM  Result Value Ref Range   Order Confirmation      ORDER PROCESSED BY BLOOD BANK Performed at [facility], [place] [address] [name], [place] [address]   Type and screen [place]     Status: None (Preliminary result)   Collection Time: 11/08/17 11:18 AM  Result Value Ref Range   ABO/RH(D) A POS    Antibody Screen NEG    Sample Expiration 11/11/2017    Unit Number [id number]    Blood Component Type RBC LR PHER2    Unit division 00    Status of Unit ISSUED,FINAL    Transfusion Status OK TO TRANSFUSE    Crossmatch Result      Compatible Performed at [facility], [place] [address] [place], [place] [address]    Unit Number [id number]    Blood Component Type RED CELLS,LR    Unit division 00    Status of Unit ISSUED    Transfusion Status OK TO TRANSFUSE    Crossmatch Result Compatible   ABO/Rh     Status: None   Collection Time: 11/08/17 11:18 AM  Result Value Ref Range   ABO/RH(D)      A POS Performed at [facility], [place] [address], [place], [place] [address]   Hemoglobin and hematocrit, blood     Status: Abnormal   Collection Time: 11/08/17  6:41 PM  Result Value Ref Range   Hemoglobin 6.9 (LL) 13.0 - 17.0 g/dL    Comment: REPEATED TO VERIFY CRITICAL VALUE NOTED.  VALUE IS CONSISTENT WITH PREVIOUSLY REPORTED AND CALLED VALUE.    HCT 21.3 (L) 39.0 - 52.0 %    Comment: Performed at [facility], [place] [address], [place], [place] [address]  Lactic acid, plasma     Status: None   Collection Time: 11/08/17  6:41 PM  Result Value Ref Range   Lactic Acid, Venous 1.4 0.5 - 1.9 mmol/L    Comment: Performed at [place] [address] [place], [place] [address]  Hemoglobin and hematocrit, blood      Status: Abnormal   Collection Time: 11/08/17  9:52 PM  Result Value Ref Range   Hemoglobin 6.6 (LL) 13.0 - 17.0 g/dL    Comment: REPEATED TO VERIFY CRITICAL VALUE NOTED.  VALUE IS CONSISTENT WITH PREVIOUSLY REPORTED AND CALLED VALUE.    HCT 20.1 (L) 39.0 - 52.0 %    Comment: Performed at [facility], [place] [address], [place], [place] [address]  Folate     Status: None   Collection Time: 11/08/17  9:52 PM  Result Value Ref Range   Folate 13.9 >5.9 ng/mL    Comment: Performed at [facility], [place] [address], [place],  [address]  Procalcitonin     Status: None   Collection Time: 11/09/17  5:12 AM  Result Value Ref Range   Procalcitonin <0.10 ng/mL    Comment:        Interpretation: PCT (Procalcitonin) <= 0.5 ng/mL: Systemic infection (sepsis) is not likely. Local bacterial infection is possible. (NOTE)       Sepsis PCT Algorithm  Lower Respiratory Tract                                      Infection PCT Algorithm    ----------------------------     ----------------------------         PCT < 0.25 ng/mL                PCT < 0.10 ng/mL         Strongly encourage             Strongly discourage   discontinuation of antibiotics    initiation of antibiotics    ----------------------------     -----------------------------       PCT 0.25 - 0.50 ng/mL            PCT 0.10 - 0.25 ng/mL               OR       >80% decrease in PCT            Discourage initiation of                                            antibiotics      Encourage discontinuation           of antibiotics    ----------------------------     -----------------------------         PCT >= 0.50 ng/mL              PCT 0.26 - 0.50 ng/mL               AND        <80% decrease in PCT             Encourage initiation of                                             antibiotics       Encourage continuation           of antibiotics    ----------------------------     -----------------------------         PCT >= 0.50 ng/mL                  PCT > 0.50 ng/mL               AND         increase in PCT                  Strongly encourage                                      initiation of antibiotics    Strongly encourage escalation           of antibiotics                                     -----------------------------  PCT <= 0.25 ng/mL                                                 OR                                        > 80% decrease in PCT                                     Discontinue / Do not initiate                                             antibiotics Performed at [facility], [place] [address], [place], [place] [address]   Vitamin B12     Status: None   Collection Time: 11/09/17  5:12 AM  Result Value Ref Range   Vitamin B-12 245 180 - 914 pg/mL    Comment: (NOTE) This assay is not validated for testing neonatal or myeloproliferative syndrome specimens for Vitamin B12 levels. Performed at [facility], [place] [address] [place], [place] [address]   Iron and TIBC     Status: Abnormal   Collection Time: 11/09/17  5:12 AM  Result Value Ref Range   Iron 78 45 - 182 ug/dL   TIBC 241 (L) 250 - 450 ug/dL   Saturation Ratios 32 17.9 - 39.5 %   UIBC 163 ug/dL    Comment: Performed at [facility], [place] [address] [place], [place] [address]  Ferritin     Status: None   Collection Time: 11/09/17  5:12 AM  Result Value Ref Range   Ferritin 142 24 - 336 ng/mL    Comment: Performed at [place] [address], [place], [place] [address]  Reticulocytes     Status: Abnormal   Collection Time: 11/09/17  5:12 AM  Result Value Ref Range   Retic Ct Pct 2.5 0.4 - 3.1 %   RBC. 2.73 (L) 4.22 - 5.81 MIL/uL   Retic Count, Absolute 68.3 19.0 - 186.0 K/uL    Comment: Performed at [place] [address] [place],  [address]  Basic metabolic panel     Status: Abnormal   Collection Time: 11/09/17  5:12  AM  Result Value Ref Range   Sodium 141 135 - 145 mmol/L   Potassium 3.5 3.5 - 5.1 mmol/L   Chloride 113 (H) 98 - 111 mmol/L   CO2 24 22 - 32 mmol/L   Glucose, Bld 95 70 - 99 mg/dL   BUN 21 (H) 6 - 20 mg/dL   Creatinine, Ser 2.34 (H) 0.61 - 1.24 mg/dL   Calcium 7.4 (L) 8.9 - 10.3 mg/dL   GFR calc non Af Amer 31 (L) >60 mL/min   GFR calc Af Amer 36 (L) >60 mL/min    Comment: (NOTE) The eGFR has been calculated using the CKD EPI equation. This calculation has not been validated in all clinical situations. eGFR's persistently <60 mL/min signify possible Chronic Kidney Disease.    Anion  gap 4 (L) 5 - 15    Comment: Performed at [facility], [place] [address] [place], [place] [address]  Prepare RBC     Status: None   Collection Time: 11/09/17  7:58 AM  Result Value Ref Range   Order Confirmation      ORDER PROCESSED BY BLOOD BANK Performed at [facility], [place] [address] [place], [place] [address]     Current Facility-Administered Medications  Medication Dose Route Frequency Provider Last Rate Last Dose  . 0.9 %  sodium chloride infusion (Manually program via Guardrails IV Fluids)   Intravenous Once Hosie Poisson, MD      . 0.9 %  sodium chloride infusion   Intravenous Continuous Eulogio Bear U, DO 100 mL/hr at 11/09/17 1150    . cefTRIAXone (ROCEPHIN) 1 g in sodium chloride 0.9 % 100 mL IVPB  1 g Intravenous Q24H Hosie Poisson, MD 200 mL/hr at 11/08/17 2352 1 g at 11/08/17 2352  . folic acid (FOLVITE) tablet 1 mg  1 mg Oral Daily Eulogio Bear U, DO   1 mg at 11/09/17 0954  . heparin injection 5,000 Units  5,000 Units Subcutaneous Q8H Vann, Jessica U, DO   5,000 Units at 11/09/17 0548  . HYDROcodone-acetaminophen (NORCO) 10-325 MG per tablet 1 tablet  1 tablet Oral Q4H PRN Hosie Poisson, MD   1 tablet at 11/09/17 1012  . metroNIDAZOLE (FLAGYL) IVPB 500 mg  500 mg Intravenous Q8H Vann, Jessica U, DO 100 mL/hr at 11/09/17 0548 500 mg at 11/09/17 0548  . ondansetron  (ZOFRAN) tablet 4 mg  4 mg Oral Q6H PRN Eulogio Bear U, DO       Or  . ondansetron (ZOFRAN) injection 4 mg  4 mg Intravenous Q6H PRN Vann, Jessica U, DO      . polyethylene glycol (MIRALAX / GLYCOLAX) packet 17 g  17 g Oral Daily PRN Vann, Jessica U, DO      . thiamine (VITAMIN B-1) tablet 100 mg  100 mg Oral Daily Vann, Jessica U, DO   100 mg at 11/09/17 3532    Musculoskeletal: Strength & Muscle Tone: within normal limits Gait & Station: UTA since patient was lying in bed. Patient leans: N/A  Psychiatric Specialty Exam: Physical Exam  Nursing note and vitals reviewed. Constitutional: He is oriented to person, place, and time. He appears well-developed and well-nourished.  HENT:  Head: Normocephalic and atraumatic.  Neck: Normal range of motion.  Respiratory: Effort normal.  Musculoskeletal: Normal range of motion.  Neurological: He is alert and oriented to person, place, and time.  Skin: No rash noted.  Psychiatric: He has a normal mood and affect. His speech is normal and behavior is normal. Judgment and thought content normal. Cognition and memory are normal.    Review of Systems  Constitutional: Negative for chills and fever.  Cardiovascular: Negative for chest pain.  Gastrointestinal: Negative for abdominal pain, constipation, diarrhea, nausea and vomiting.  Musculoskeletal: Positive for back pain.  Psychiatric/Behavioral: Positive for substance abuse. Negative for depression, hallucinations and suicidal ideas. The patient is not nervous/anxious and does not have insomnia.   All other systems reviewed and are negative.   Blood pressure 118/66, pulse 70, temperature 98.2 F (36.8 C), temperature source Oral, resp. rate 18, height '6\' 2"'$  (1.88 m), weight 90.8 kg, SpO2 99 %.Body mass index is 25.7 kg/m.  General Appearance: Fairly Groomed, athletic build, middle aged, [place] American male with low haircut who is lying in bed.  NAD.   Eye Contact:  Good  Speech:  Clear and  Coherent and Normal Rate  Volume:  Normal  Mood:  "I'm better."   Affect:  Congruent  Thought Process:  Goal Directed, Linear and Descriptions of Associations: Intact  Orientation:  Full (Time, Place, and Person)  Thought Content:  Logical  Suicidal Thoughts:  No  Homicidal Thoughts:  No  Memory:  Immediate;   Good Recent;   Good Remote;   Good  Judgement:  Good  Insight:  Good  Psychomotor Activity:  Normal  Concentration:  Concentration: Good and Attention Span: Good  Recall:  Good  Fund of Knowledge:  Good  Language:  Good  Akathisia:  No  Handed:  Right  AIMS (if indicated):   N/A  Assets:  Communication Skills Desire for Improvement Financial Resources/Insurance Housing Intimacy Resilience Social Support  ADL's:  Intact  Cognition:  WNL  Sleep:   Fair   Assessment:  Benjamin [name] is a 52 y.o. male who presents to the hospital with metabolic encephalopathy. He reports an improvement in his mood today. He reports changes in mood and relapsing on drugs in the setting of medication changes. He would like to start restart medications with his future outpatient provider. He denies SI, HI or AVH. He denies paranoia and does not appear to be responding to internal stimuli. He does not warrant inpatient psychiatric hospitalization at this time.   Treatment Plan Summary: -Patient would not like to restart psychotropic medications at this time until he establishes care with an outpatient psychiatrist. Discussed Prazosin for PTSD symptoms with patient. He will ask his future provider about this medication. -Please have SW provide patient with resources for substance abuse treatment and outpatient psychiatrists and therapists. -Psychiatry will sign off on patient at this time. Please consult psychiatry again as needed.   Disposition: No evidence of imminent risk to self or others at present.   Patient does not meet criteria for psychiatric inpatient admission.  Faythe Dingwall, DO 11/09/2017 12:05 PM

## 2017-11-10 LAB — BASIC METABOLIC PANEL
Anion gap: 5 (ref 5–15)
BUN: 12 mg/dL (ref 6–20)
CHLORIDE: 113 mmol/L — AB (ref 98–111)
CO2: 24 mmol/L (ref 22–32)
Calcium: 7.6 mg/dL — ABNORMAL LOW (ref 8.9–10.3)
Creatinine, Ser: 1.85 mg/dL — ABNORMAL HIGH (ref 0.61–1.24)
GFR calc Af Amer: 47 mL/min — ABNORMAL LOW (ref >60)
GFR calc non Af Amer: 41 mL/min — ABNORMAL LOW (ref >60)
GLUCOSE: 99 mg/dL (ref 70–99)
POTASSIUM: 3.7 mmol/L (ref 3.5–5.1)
Sodium: 142 mmol/L (ref 135–145)

## 2017-11-10 LAB — TYPE AND SCREEN
ABO/RH(D): A POS
Antibody Screen: NEGATIVE
UNIT DIVISION: 0
Unit division: 0

## 2017-11-10 LAB — BPAM RBC
Blood Product Expiration Date: 201909022359
Blood Product Expiration Date: 201909062359
ISSUE DATE / TIME: 201908151543
ISSUE DATE / TIME: 201908160938
UNIT TYPE AND RH: 6200
UNIT TYPE AND RH: 6200

## 2017-11-10 LAB — CBC
HCT: 22.3 % — ABNORMAL LOW (ref 39.0–52.0)
HEMOGLOBIN: 7.2 g/dL — AB (ref 13.0–17.0)
MCH: 26.2 pg (ref 26.0–34.0)
MCHC: 32.3 g/dL (ref 30.0–36.0)
MCV: 81.1 fL (ref 78.0–100.0)
Platelets: 107 10*3/uL — ABNORMAL LOW (ref 150–400)
RBC: 2.75 MIL/uL — AB (ref 4.22–5.81)
RDW: 16.1 % — ABNORMAL HIGH (ref 11.5–15.5)
WBC: 5 10*3/uL (ref 4.0–10.5)

## 2017-11-10 LAB — OCCULT BLOOD X 1 CARD TO LAB, STOOL: Fecal Occult Bld: NEGATIVE

## 2017-11-10 MED ORDER — AMOXICILLIN-POT CLAVULANATE 875-125 MG PO TABS
1.0000 | ORAL_TABLET | Freq: Two times a day (BID) | ORAL | Status: DC
  Administered 2017-11-10 – 2017-11-12 (×4): 1 via ORAL
  Filled 2017-11-10 (×4): qty 1

## 2017-11-10 MED ORDER — NAPHAZOLINE-PHENIRAMINE 0.025-0.3 % OP SOLN
1.0000 [drp] | Freq: Four times a day (QID) | OPHTHALMIC | Status: DC | PRN
  Administered 2017-11-10 – 2017-11-12 (×2): 1 [drp] via OPHTHALMIC
  Filled 2017-11-10: qty 15

## 2017-11-10 NOTE — Progress Notes (Signed)
PROGRESS NOTE    Benjamin [name]  WUJ:[id number] DOB: 08-May-1965 DOA: 11/07/2017 PCP: Jac Canavan, PA-C   Brief Narrative: Benjamin [name] is a 52 y.o. male with medical history significant of chronic pain, HTN, and substance abuse was brought to ED, after he was found minimally responsive in the bed room of a hotel with feces. He reports taht he was partying with friends and occasionally uses cocaine.  On arrival to he was hypotensive, tachycardic and in altered mental status. He was admitted for further evaluation.    Assessment & Plan:   Principal Problem:   Cocaine use disorder (HCC) Active Problems:   Chronic back pain   Cocaine abuse (HCC)   Essential hypertension   Chronic kidney disease (CKD), stage III (moderate) (HCC)   AKI (acute kidney injury) (HCC)   Hypotension   Leukocytosis   Acute metabolic encephalopathy: secondary to ARF.  Resolved.pt appears to be back to baseline.  EEG on admission does not show any seizure activity..   Acute on stage 3 CKD:  Probably pre renal azotemia from dehydration.  hdyrate and repeat renal parameters in am show improvement.  UA does not show any infection. CT abd does not show any hydronephrosis.  Baseline creatinine at 1.3 , on admission was 3.24> 3.20>2.80>2.34> 1.85. He will need further treatment with iv FLUIDS for another 24 hours.  Continue to monitor.  Monitor urine output.   Substance use disorder:  Pt UDS was positive for cocaine. Social work consulted for resources.   H/o anxiety, depression and PTSD with insomnia and nightmares: As per the fiance at bedside, he is being weaned of Trintellix, and is not taking prozac.  Psychiatry consulted for meds and recommendations.  He denies any suicidal ideation at this time.  Social work consult pending.    Hypotension resolved with fluid bolus.   Hypertension: bp parameters are better today.   Metabolic acidosis: improved.    Rhabdomyolysis:  Non traumatic,  improved.    Elevated troponin and lactic acid : Elevated troponin probably from demand ischemia from cocaine use and dehydration. echocardiogram ordered, which showed normal LVEF and no regional wall abnormalities.  Repeat EKG ordered does not show ischemic changes.  Elevated lactic acid from dehydration.  Lactic acid normalized.    Pt reports some dizziness, chest heaviness on ambulation and sob: Unclear if he aspirated.  He was empirically started on IV antibiotics for aspiration pneumonia. He has been afebrile so far.  Change to oral antibiotics today.    Anemia:  Unclear etiology.  Appears microcytic.  He had a laceration to the elbow which was bandaged and there is no more bleeding from it.  Anemia panel ordered and reviewed.   Stool for occult blood ordered and is negative.  2 unit of prbc transfusion ordered. Repeat H&H I greater than 7. Transfuse to keep hemoglobin greater than 7.  Pt's fiance at bedside reports he occasionally uses meloxicam for back pain. Pt denies any bleeding per rectum and black stools.     Chronic pain syndrome: On hydrocodone every 4 hours prn for the back pain.     DVT prophylaxis: sq heparin.   Code Status:  Full code.  Family Communication: fiance at bedside.  Disposition Plan: pending clinical improvement. Awaiting creatinine to improve and social work consult for resources.    Consultants:   Psychiatry.   Procedures:Echocardiogram.   Antimicrobials: ( rocephin and flagyl for possible aspiration pneumonia. To augmentin.   Subjective: Reports having a panic attack last  night with some anxiety and sob. Resolved spontaneously.   Objective: Vitals:   11/09/17 1355 11/09/17 2219 11/10/17 0430 11/10/17 1201  BP: 129/66 138/90 124/77 (!) 144/77  Pulse: 67 62 64 (!) 59  Resp: 18 18 18 20   Temp: 98.3 F (36.8 C) 98.2 F (36.8 C) 98.3 F (36.8 C) 98.1 F (36.7 C)  TempSrc: Oral Oral Oral Oral  SpO2: 100% 100% 99% 100%  Weight:       Height:        Intake/Output Summary (Last 24 hours) at 11/10/2017 1803 Last data filed at 11/10/2017 0600 Gross per 24 hour  Intake 460 ml  Output 200 ml  Net 260 ml   Filed Weights   11/07/17 1722 11/07/17 2056 11/08/17 2104  Weight: 90.7 kg 90.7 kg 90.8 kg    Examination:  General exam: Appears calm, not in distress. On RA.  Respiratory system: air entry fair.  Cardiovascular system: S1 & S2 heard, RRR. No JVD, . No pedal edema. Gastrointestinal system: Abdomen is soft NT nd bs+ Central nervous system: Alert and oriented. Non focal.  Extremities: Symmetric 5 x 5 power. Skin: No rashes, lesions or ulcers Psychiatry: . Mood & affect appropriate.     Data Reviewed: I have personally reviewed following labs and imaging studies  CBC: Recent Labs  Lab 11/07/17 1247  11/08/17 0308 11/08/17 0613 11/08/17 1841 11/08/17 2152 11/09/17 1457 11/10/17 0458  WBC 15.6*  --  11.7*  --   --   --  5.1 5.0  NEUTROABS 12.9*  --   --   --   --   --   --   --   HGB 10.5*   < > 6.9* 6.6* 6.9* 6.6* 7.5* 7.2*  HCT 33.7*   < > 21.5* 20.6* 21.3* 20.1* 23.2* 22.3*  MCV 77.8*  --  75.4*  --   --   --  80.0 81.1  PLT 215  --  137*  --   --   --  105* 107*   < > = values in this interval not displayed.   Basic Metabolic Panel: Recent Labs  Lab 11/07/17 1247 11/07/17 1255 11/08/17 0308 11/09/17 0512 11/10/17 0458  NA 141 137 140 141 142  K 3.3* 3.7 3.8 3.5 3.7  CL 106 106 110 113* 113*  CO2 16*  --  22 24 24   GLUCOSE 150* 174* 128* 95 99  BUN 23* 24* 30* 21* 12  CREATININE 3.24* 3.20* 2.82* 2.34* 1.85*  CALCIUM 8.9  --  7.7* 7.4* 7.6*  MG 2.4  --   --   --   --    GFR: Estimated Creatinine Clearance: 55.5 mL/min (A) (by C-G formula based on SCr of 1.85 mg/dL (H)). Liver Function Tests: Recent Labs  Lab 11/07/17 1247  AST 52*  ALT 30  ALKPHOS 35*  BILITOT 3.5*  PROT 5.7*  ALBUMIN 4.0   Recent Labs  Lab 11/07/17 1247  LIPASE 31   Recent Labs  Lab 11/07/17 1248   AMMONIA 25   Coagulation Profile: Recent Labs  Lab 11/07/17 1247  INR 1.24   Cardiac Enzymes: Recent Labs  Lab 11/07/17 1247 11/07/17 1727 11/07/17 2305 11/08/17 0308 11/08/17 1026  CKTOTAL 869*  --   --   --  632*  TROPONINI 0.26* 0.43* 0.56* 0.77*  --    BNP (last 3 results) No results for input(s): PROBNP in the last 8760 hours. HbA1C: No results for input(s): HGBA1C in the last 72  hours. CBG: Recent Labs  Lab 11/07/17 1300  GLUCAP 148*   Lipid Profile: No results for input(s): CHOL, HDL, LDLCALC, TRIG, CHOLHDL, LDLDIRECT in the last 72 hours. Thyroid Function Tests: Recent Labs    11/08/17 0308  TSH 0.476   Anemia Panel: Recent Labs    11/08/17 2152 11/09/17 0512  VITAMINB12  --  245  FOLATE 13.9  --   FERRITIN  --  142  TIBC  --  241*  IRON  --  78  RETICCTPCT  --  2.5   Sepsis Labs: Recent Labs  Lab 11/07/17 1727 11/07/17 2006 11/08/17 0308 11/08/17 1026 11/08/17 1841 11/09/17 0512  PROCALCITON 0.12  --  0.27  --   --  <0.10  LATICACIDVEN 4.5* 3.2*  --  2.9* 1.4  --     Recent Results (from the past 240 hour(s))  Culture, blood (routine x 2)     Status: None (Preliminary result)   Collection Time: 11/07/17  1:37 PM  Result Value Ref Range Status   Specimen Description BLOOD LEFT FOREARM  Final   Special Requests   Final    BOTTLES DRAWN AEROBIC AND ANAEROBIC Blood Culture results may not be optimal due to an inadequate volume of blood received in culture bottles   Culture   Final    NO GROWTH 3 DAYS Performed at [facility], [address] [place], [place] [address]    Report Status PENDING  Incomplete  Culture, blood (routine x 2)     Status: None (Preliminary result)   Collection Time: 11/07/17  2:48 PM  Result Value Ref Range Status   Specimen Description BLOOD RIGHT HAND  Final   Special Requests   Final    BOTTLES DRAWN AEROBIC AND ANAEROBIC Blood Culture adequate volume   Culture   Final    NO GROWTH 3  DAYS Performed at [facility], [address] [place], [place] [address]    Report Status PENDING  Incomplete  Urine culture     Status: None   Collection Time: 11/07/17  4:20 PM  Result Value Ref Range Status   Specimen Description URINE, CLEAN CATCH  Final   Special Requests Normal  Final   Culture   Final    NO GROWTH Performed at [facility], [address] [place], [place] [address]    Report Status 11/08/2017 FINAL  Final         Radiology Studies: No results found.      Scheduled Meds: . sodium chloride   Intravenous Once  . folic acid  1 mg Oral Daily  . heparin  5,000 Units Subcutaneous Q8H  . senna-docusate  1 tablet Oral BID  . thiamine  100 mg Oral Daily   Continuous Infusions: . sodium chloride 100 mL/hr at 11/10/17 1156  . cefTRIAXone (ROCEPHIN)  IV 1 g (11/10/17 0042)  . metronidazole 500 mg (11/10/17 1221)     LOS: 3 days    Time spent: 45 min    Kathlen ModyVijaya Viviann Broyles, MD [facility] Pager [phone] If 7PM-7AM, please contact night-coverage [email or web address] Password [email or web address] 11/10/2017, 6:03 PM

## 2017-11-10 NOTE — Evaluation (Signed)
Physical Therapy Evaluation Patient Details Name: Benjamin HaagensenJohn [name] MRN: [id number] DOB: 03-18-1966 Today's Date: 11/10/2017   History of Present Illness  Pt is a 52 y.o. M with signficant PMH of chronic pain, HTN, substance abuse who was brought to ED after he was found minimally responsive in bed room of a hotel with feces. Reports he was partying with friends and occasionally uses cocaine. On arrival he was hypotensive, tachycardic, and in altered mental status.  Clinical Impression  Pt admitted with above diagnosis. Pt currently with functional limitations due to the deficits listed below (see PT Problem List). Patient demonstrating 5/5 strength, but functionally, displays balance deficits requiring min guard assist ambulating 200 feet with no assistive device. Scoring 15/24 on the Dynamic Gait Index indicating he is at high risk for falls. Pt will benefit from skilled PT to increase their independence and safety with mobility to allow discharge to the venue listed below.       Follow Up Recommendations Outpatient PT;Supervision for mobility/OOB    Equipment Recommendations  None recommended by PT    Recommendations for Other Services       Precautions / Restrictions Precautions Precautions: Fall Restrictions Weight Bearing Restrictions: No      Mobility  Bed Mobility Overal bed mobility: Independent                Transfers Overall transfer level: Modified independent                  Ambulation/Gait Ambulation/Gait assistance: Min guard Gait Distance (Feet): 200 Feet Assistive device: None Gait Pattern/deviations: Step-through pattern;Decreased stride length;Ataxic Gait velocity: decr Gait velocity interpretation: <1.31 ft/sec, indicative of household ambulator General Gait Details: Patient with slightly ataxic gait, displaying increased knee hyperextension in mid stance and decreased pelvic rotation. Very guarded and stiff gait  Stairs             Wheelchair Mobility    Modified Rankin (Stroke Patients Only)       Balance                                 Standardized Balance Assessment Standardized Balance Assessment : Dynamic Gait Index   Dynamic Gait Index Level Surface: Moderate Impairment Change in Gait Speed: Mild Impairment Gait with Horizontal Head Turns: Normal Gait with Vertical Head Turns: Normal Gait and Pivot Turn: Mild Impairment Step Over Obstacle: Moderate Impairment Step Around Obstacles: Mild Impairment Steps: Moderate Impairment Total Score: 15       Pertinent Vitals/Pain Pain Assessment: Faces Faces Pain Scale: Hurts little more Pain Location: low back Pain Descriptors / Indicators: Aching Pain Intervention(s): Monitored during session;Patient requesting pain meds-RN notified    Home Living Family/patient expects to be discharged to:: Private residence Living Arrangements: Spouse/significant other Available Help at Discharge: Family Type of Home: House Home Access: Stairs to enter Entrance Stairs-Rails: Right Entrance Stairs-Number of Steps: 12 Home Layout: Multi-level Home Equipment: Cane - single point;Crutches      Prior Function Level of Independence: Independent         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[employer], educationalmaintenance     Hand Dominance        Extremity/Trunk Assessment   Upper Extremity Assessment Upper Extremity Assessment: RUE deficits/detail;LUE deficits/detail RUE Deficits / Details: 5/5 LUE Deficits / Details: 5/5    Lower Extremity Assessment Lower Extremity Assessment: RLE deficits/detail;LLE deficits/detail RLE Deficits / Details: 5/5 LLE Deficits / Details: 5/5  Communication   Communication: No difficulties  Cognition Arousal/Alertness: Awake/alert Behavior During Therapy: WFL for tasks assessed/performed Overall Cognitive Status: Within Functional Limits for tasks assessed                                        General  Comments      Exercises     Assessment/Plan    PT Assessment Patient needs continued PT services  PT Problem List Decreased activity tolerance;Decreased balance;Decreased mobility;Pain       PT Treatment Interventions Gait training;Stair training;Functional mobility training;Therapeutic activities;Therapeutic exercise;Balance training;Patient/family education    PT Goals (Current goals can be found in the Care Plan section)  Acute Rehab PT Goals Patient Stated Goal: walk normally PT Goal Formulation: With patient Time For Goal Achievement: 11/24/17 Potential to Achieve Goals: Good    Frequency Min 3X/week   Barriers to discharge        Co-evaluation               AM-PAC PT "6 Clicks" Daily Activity  Outcome Measure Difficulty turning over in bed (including adjusting bedclothes, sheets and blankets)?: None Difficulty moving from lying on back to sitting on the side of the bed? : None Difficulty sitting down on and standing up from a chair with arms (e.g., wheelchair, bedside commode, etc,.)?: None Help needed moving to and from a bed to chair (including a wheelchair)?: None Help needed walking in hospital room?: A Little Help needed climbing 3-5 steps with a railing? : A Lot 6 Click Score: 21    End of Session Equipment Utilized During Treatment: Gait belt Activity Tolerance: Patient tolerated treatment well Patient left: Other (comment);with family/visitor present;with call bell/phone within reach(seated EOB) Nurse Communication: Mobility status PT Visit Diagnosis: Unsteadiness on feet (R26.81);Difficulty in walking, not elsewhere classified (R26.2)    Time: [id number] PT Time Calculation (min) (ACUTE ONLY): 18 min   Charges:   PT Evaluation $PT Eval Moderate Complexity: 1 Mod          Laurina Bustlearoline Carloyn Lahue, PT, DPT Acute Rehabilitation Services  Pager: [phone]   Vanetta MuldersCarloine H Soraida Vickers 11/10/2017, 5:35 PM

## 2017-11-11 ENCOUNTER — Inpatient Hospital Stay (HOSPITAL_COMMUNITY): Payer: BLUE CROSS/BLUE SHIELD

## 2017-11-11 ENCOUNTER — Encounter (HOSPITAL_COMMUNITY): Payer: Self-pay | Admitting: Radiology

## 2017-11-11 LAB — BASIC METABOLIC PANEL
Anion gap: 5 (ref 5–15)
BUN: 9 mg/dL (ref 6–20)
CALCIUM: 8.1 mg/dL — AB (ref 8.9–10.3)
CO2: 26 mmol/L (ref 22–32)
Chloride: 112 mmol/L — ABNORMAL HIGH (ref 98–111)
Creatinine, Ser: 1.72 mg/dL — ABNORMAL HIGH (ref 0.61–1.24)
GFR calc Af Amer: 52 mL/min — ABNORMAL LOW (ref >60)
GFR, EST NON AFRICAN AMERICAN: 45 mL/min — AB (ref >60)
Glucose, Bld: 99 mg/dL (ref 70–99)
Potassium: 3.8 mmol/L (ref 3.5–5.1)
SODIUM: 143 mmol/L (ref 135–145)

## 2017-11-11 LAB — CBC
HCT: 24.3 % — ABNORMAL LOW (ref 39.0–52.0)
Hemoglobin: 7.5 g/dL — ABNORMAL LOW (ref 13.0–17.0)
MCH: 25.6 pg — ABNORMAL LOW (ref 26.0–34.0)
MCHC: 30.9 g/dL (ref 30.0–36.0)
MCV: 82.9 fL (ref 78.0–100.0)
PLATELETS: 123 10*3/uL — AB (ref 150–400)
RBC: 2.93 MIL/uL — ABNORMAL LOW (ref 4.22–5.81)
RDW: 16.9 % — AB (ref 11.5–15.5)
WBC: 4.8 10*3/uL (ref 4.0–10.5)

## 2017-11-11 MED ORDER — CLONAZEPAM 0.25 MG PO TBDP: 0.2500 mg | ORAL_TABLET | Freq: Two times a day (BID) | ORAL | Status: DC | PRN

## 2017-11-11 NOTE — Progress Notes (Signed)
Patient went down to complete MRI of brain. Patient was not able to complete MRI due to becoming anxious. Patient attempted to stay longer in MRI but anxiety increased. Patient requested to try again tomorrow with possible medication to help with anxiety.  Veatrice KellsMahmoud,Ofelia Podolski I, RN

## 2017-11-11 NOTE — Progress Notes (Signed)
Visited with fiance yesterday evening.  They would like to proceed with getting Advanced Directive notarized.  It was around 9pm and difficult at this hour to find people to be witnesses so we agreed to wait until Monday morning when witnesses would be back in house.  Thank you.  I will pass information on to colleagues and seek to get completion done on Monday.    11/11/17 0847  Clinical Encounter Type  Visited With Patient and family together  Visit Type Follow-up;Spiritual support

## 2017-11-11 NOTE — Progress Notes (Signed)
PROGRESS NOTE    Benjamin [name]  WUJ:[id number] DOB: 1966/01/18 DOA: 11/07/2017 PCP: Jac Canavan, PA-C   Brief Narrative: Benjamin [name] is a 52 y.o. male with medical history significant of chronic pain, HTN, and substance abuse was brought to ED, after he was found minimally responsive in the bed room of a hotel with feces. He reports taht he was partying with friends and occasionally uses cocaine.  On arrival to he was hypotensive, tachycardic and in altered mental status. He was admitted for further evaluation.    Assessment & Plan:   Principal Problem:   Cocaine use disorder (HCC) Active Problems:   Chronic back pain   Cocaine abuse (HCC)   Essential hypertension   Chronic kidney disease (CKD), stage III (moderate) (HCC)   AKI (acute kidney injury) (HCC)   Hypotension   Leukocytosis   Acute metabolic encephalopathy: secondary to ARF.  Resolved.pt appears to be back to baseline.  EEG on admission does not show any seizure activity..   Acute on stage 3 CKD:  Probably pre renal azotemia from dehydration.  hdyrate and repeat renal parameters in am show improvement.  UA does not show any infection. CT abd does not show any hydronephrosis.  Baseline creatinine at 1.3 , on admission was 3.24> 3.20>2.80>2.34> 1.85>1.72. Resume IV fluids.  Monitor urine output. Check labs in am.   Substance use disorder:  Pt UDS was positive for cocaine. Social work consulted for resources.   H/o anxiety, depression and PTSD with insomnia and nightmares: As per the fiance at bedside, he is being weaned of Trintellix, and is not taking prozac.  Psychiatry consulted for meds and recommendations.  He denies any suicidal ideation at this time. Pt reports he is very nervous, would do trial of klonapin for his anxiety.  Social work consult pending.    Hypotension resolved with fluid bolus.   Hypertension: bp parameters are better today.   Metabolic acidosis: improved.     Rhabdomyolysis:  Non traumatic, improved.    Elevated troponin and lactic acid : Elevated troponin probably from demand ischemia from cocaine use and dehydration. echocardiogram ordered, which showed normal LVEF and no regional wall abnormalities.  Repeat EKG ordered does not show ischemic changes.  Elevated lactic acid from dehydration.  Lactic acid normalized.    Pt reports some dizziness, chest heaviness on ambulation and sob: Unclear if he aspirated.  He was empirically started on IV antibiotics for aspiration pneumonia. He has been afebrile so far. Oral augmentin to complete the course.    Anemia:  Unclear etiology.  Appears microcytic.  He had a laceration to the elbow which was bandaged and there is no more bleeding from it.  Anemia panel ordered and reviewed.   Stool for occult blood ordered and is negative.  2 unit of prbc transfusion ordered. Repeat H&H I greater than 7. Transfuse to keep hemoglobin greater than 7.   Chronic pain syndrome: On hydrocodone every 4 hours prn for the back pain.   Unsteady gait when working with PT: Unclear etiology. Initial CT head is negative.  Will get an MRI brain without contrast for further evaluation.   DVT prophylaxis: sq heparin.   Code Status:  Full code.  Family Communication: fiance at bedside.  Disposition Plan: pending clinical improvement. Awaiting creatinine to improve and social work consult for resources.    Consultants:   Psychiatry.   Procedures:Echocardiogram.   Antimicrobials: ( rocephin and flagyl for possible aspiration pneumonia. To augmentin.   Subjective: No  chest pain or sob, unsteady gait when working with PT.   Objective: Vitals:   11/10/17 1904 11/10/17 1931 11/11/17 0637 11/11/17 0832  BP: 136/76 136/84 (!) 142/84 139/85  Pulse: (!) 57 66 (!) 56 (!) 53  Resp: (!) 24 20 20 18   Temp: 97.7 F (36.5 C) 98.6 F (37 C) (!) 97.5 F (36.4 C) 98.5 F (36.9 C)  TempSrc: Oral Oral Oral Oral   SpO2: 100% 99% 98% 98%  Weight:      Height:        Intake/Output Summary (Last 24 hours) at 11/11/2017 1340 Last data filed at 11/11/2017 1104 Gross per 24 hour  Intake 342 ml  Output 750 ml  Net -408 ml   Filed Weights   11/07/17 1722 11/07/17 2056 11/08/17 2104  Weight: 90.7 kg 90.7 kg 90.8 kg    Examination:  General exam: calm and comfortable,  Respiratory system: air entry fair. no wheezing or rhonchi Cardiovascular system: S1 & S2 heard, RRR. No JVD, . No pedal edema. Gastrointestinal system: Abdomen is soft NT nd bs+ Central nervous system: Alert and oriented. Non focal.  Extremities: Symmetric 5 x 5 power. Skin: No rashes, lesions or ulcers Psychiatry: . Mood & affect appropriate.     Data Reviewed: I have personally reviewed following labs and imaging studies  CBC: Recent Labs  Lab 11/07/17 1247  11/08/17 0308  11/08/17 1841 11/08/17 2152 11/09/17 1457 11/10/17 0458 11/11/17 0501  WBC 15.6*  --  11.7*  --   --   --  5.1 5.0 4.8  NEUTROABS 12.9*  --   --   --   --   --   --   --   --   HGB 10.5*   < > 6.9*   < > 6.9* 6.6* 7.5* 7.2* 7.5*  HCT 33.7*   < > 21.5*   < > 21.3* 20.1* 23.2* 22.3* 24.3*  MCV 77.8*  --  75.4*  --   --   --  80.0 81.1 82.9  PLT 215  --  137*  --   --   --  105* 107* 123*   < > = values in this interval not displayed.   Basic Metabolic Panel: Recent Labs  Lab 11/07/17 1247 11/07/17 1255 11/08/17 0308 11/09/17 0512 11/10/17 0458 11/11/17 0501  NA 141 137 140 141 142 143  K 3.3* 3.7 3.8 3.5 3.7 3.8  CL 106 106 110 113* 113* 112*  CO2 16*  --  22 24 24 26   GLUCOSE 150* 174* 128* 95 99 99  BUN 23* 24* 30* 21* 12 9  CREATININE 3.24* 3.20* 2.82* 2.34* 1.85* 1.72*  CALCIUM 8.9  --  7.7* 7.4* 7.6* 8.1*  MG 2.4  --   --   --   --   --    GFR: Estimated Creatinine Clearance: 59.7 mL/min (A) (by C-G formula based on SCr of 1.72 mg/dL (H)). Liver Function Tests: Recent Labs  Lab 11/07/17 1247  AST 52*  ALT 30  ALKPHOS 35*   BILITOT 3.5*  PROT 5.7*  ALBUMIN 4.0   Recent Labs  Lab 11/07/17 1247  LIPASE 31   Recent Labs  Lab 11/07/17 1248  AMMONIA 25   Coagulation Profile: Recent Labs  Lab 11/07/17 1247  INR 1.24   Cardiac Enzymes: Recent Labs  Lab 11/07/17 1247 11/07/17 1727 11/07/17 2305 11/08/17 0308 11/08/17 1026  CKTOTAL 869*  --   --   --  632*  TROPONINI  0.26* 0.43* 0.56* 0.77*  --    BNP (last 3 results) No results for input(s): PROBNP in the last 8760 hours. HbA1C: No results for input(s): HGBA1C in the last 72 hours. CBG: Recent Labs  Lab 11/07/17 1300  GLUCAP 148*   Lipid Profile: No results for input(s): CHOL, HDL, LDLCALC, TRIG, CHOLHDL, LDLDIRECT in the last 72 hours. Thyroid Function Tests: No results for input(s): TSH, T4TOTAL, FREET4, T3FREE, THYROIDAB in the last 72 hours. Anemia Panel: Recent Labs    11/08/17 2152 11/09/17 0512  VITAMINB12  --  245  FOLATE 13.9  --   FERRITIN  --  142  TIBC  --  241*  IRON  --  78  RETICCTPCT  --  2.5   Sepsis Labs: Recent Labs  Lab 11/07/17 1727 11/07/17 2006 11/08/17 0308 11/08/17 1026 11/08/17 1841 11/09/17 0512  PROCALCITON 0.12  --  0.27  --   --  <0.10  LATICACIDVEN 4.5* 3.2*  --  2.9* 1.4  --     Recent Results (from the past 240 hour(s))  Culture, blood (routine x 2)     Status: None (Preliminary result)   Collection Time: 11/07/17  1:37 PM  Result Value Ref Range Status   Specimen Description BLOOD LEFT FOREARM  Final   Special Requests   Final    BOTTLES DRAWN AEROBIC AND ANAEROBIC Blood Culture results may not be optimal due to an inadequate volume of blood received in culture bottles   Culture   Final    NO GROWTH 3 DAYS Performed at [facility], [address] [place], [place] [address]    Report Status PENDING  Incomplete  Culture, blood (routine x 2)     Status: None (Preliminary result)   Collection Time: 11/07/17  2:48 PM  Result Value Ref Range Status   Specimen Description  BLOOD RIGHT HAND  Final   Special Requests   Final    BOTTLES DRAWN AEROBIC AND ANAEROBIC Blood Culture adequate volume   Culture   Final    NO GROWTH 3 DAYS Performed at [facility], [address] [place], [place] [address]    Report Status PENDING  Incomplete  Urine culture     Status: None   Collection Time: 11/07/17  4:20 PM  Result Value Ref Range Status   Specimen Description URINE, CLEAN CATCH  Final   Special Requests Normal  Final   Culture   Final    NO GROWTH Performed at [facility], [address] [place], [place] [address]    Report Status 11/08/2017 FINAL  Final         Radiology Studies: No results found.      Scheduled Meds: . sodium chloride   Intravenous Once  . amoxicillin-clavulanate  1 tablet Oral Q12H  . folic acid  1 mg Oral Daily  . heparin  5,000 Units Subcutaneous Q8H  . senna-docusate  1 tablet Oral BID  . thiamine  100 mg Oral Daily   Continuous Infusions: . sodium chloride 100 mL/hr at 11/11/17 1101     LOS: 4 days    Time spent: 45 min    Kathlen ModyVijaya Akari Defelice, MD [facility] Pager [phone] If 7PM-7AM, please contact night-coverage [email or web address] Password [email or web address] 11/11/2017, 1:40 PM

## 2017-11-11 NOTE — Progress Notes (Signed)
Physical Therapy Treatment Patient Details Name: Benjamin HaagensenJohn Vanburen MRN: [id number] DOB: 10-30-1965 Today's Date: 11/11/2017    History of Present Illness Pt is a 52 y.o. M with signficant PMH of chronic pain, HTN, substance abuse who was brought to ED after he was found minimally responsive in bed room of a hotel with feces. Reports he was partying with friends and occasionally uses cocaine. On arrival he was hypotensive, tachycardic, and in altered mental status.    PT Comments    Patient is progressing very well towards their physical therapy goals. Remains motivated to participate, however, declining stair training this session, stating, "I need to be more stable first." Session focused on continued gait training, increasing activity tolerance, and proximal strengthening. Patient with increased ambulation distance to 250 feet with no assistive device and min guard assist. Persists with decreased endurance and stiff, guarded gait pattern. Also noted Trendelenberg gait pattern so rest of session focused on proximal hip strengthening and core stabilization for chronic back pain. Will continue to progress as tolerated.    Follow Up Recommendations  Outpatient PT;Supervision for mobility/OOB     Equipment Recommendations  None recommended by PT    Recommendations for Other Services       Precautions / Restrictions Precautions Precautions: Fall Restrictions Weight Bearing Restrictions: No    Mobility  Bed Mobility Overal bed mobility: Independent                Transfers Overall transfer level: Independent               General transfer comment: From low bed surface  Ambulation/Gait Ambulation/Gait assistance: Min guard Gait Distance (Feet): 250 Feet Assistive device: None Gait Pattern/deviations: Step-through pattern;Decreased stride length;Wide base of support Gait velocity: decr Gait velocity interpretation: <1.31 ft/sec, indicative of household  ambulator General Gait Details: Patient with initially wide BOS and provided cues for keeping feet shoulder width apart. Displays bilateral foot external rotation which patient states is baseline. Overall, continues with very guarded and stiff gait with decreased pelvic rotation and knee flexion during swing phase. Also noted Trendlenberg gait pattern.   Stairs             Wheelchair Mobility    Modified Rankin (Stroke Patients Only)       Balance                                 Standardized Balance Assessment Standardized Balance Assessment : Dynamic Gait Index   Dynamic Gait Index Level Surface: Moderate Impairment Change in Gait Speed: Mild Impairment Gait with Horizontal Head Turns: Normal Gait with Vertical Head Turns: Normal Gait and Pivot Turn: Mild Impairment Step Over Obstacle: Moderate Impairment Step Around Obstacles: Mild Impairment Steps: Moderate Impairment Total Score: 15      Cognition Arousal/Alertness: Awake/alert Behavior During Therapy: WFL for tasks assessed/performed Overall Cognitive Status: Within Functional Limits for tasks assessed                                        Exercises Other Exercises Other Exercises: Standing hip abduction x 15 (bilateral)(light fingertip support on chair) Other Exercises: PNF rhythmic stabilization of trunk anterior/posterior in standing for core activation    General Comments        Pertinent Vitals/Pain Pain Assessment: Faces Faces Pain Scale: Hurts little more Pain Location:  low back Pain Descriptors / Indicators: Aching Pain Intervention(s): Monitored during session;Patient requesting pain meds-RN notified    Home Living                      Prior Function            PT Goals (current goals can now be found in the care plan section) Acute Rehab PT Goals Patient Stated Goal: walk normally PT Goal Formulation: With patient Time For Goal Achievement:  11/24/17 Potential to Achieve Goals: Good Progress towards PT goals: Progressing toward goals    Frequency    Min 3X/week      PT Plan Current plan remains appropriate    Co-evaluation              AM-PAC PT "6 Clicks" Daily Activity  Outcome Measure  Difficulty turning over in bed (including adjusting bedclothes, sheets and blankets)?: None Difficulty moving from lying on back to sitting on the side of the bed? : None Difficulty sitting down on and standing up from a chair with arms (e.g., wheelchair, bedside commode, etc,.)?: None Help needed moving to and from a bed to chair (including a wheelchair)?: None Help needed walking in hospital room?: A Little Help needed climbing 3-5 steps with a railing? : A Lot 6 Click Score: 21    End of Session Equipment Utilized During Treatment: Gait belt Activity Tolerance: Patient tolerated treatment well Patient left: Other (comment);with family/visitor present;with call bell/phone within reach(seated EOB) Nurse Communication: Mobility status PT Visit Diagnosis: Unsteadiness on feet (R26.81);Difficulty in walking, not elsewhere classified (R26.2)     Time: [id number] PT Time Calculation (min) (ACUTE ONLY): 18 min  Charges:  $Gait Training: 8-22 mins                     Laurina Bustlearoline Shyleigh Daughtry, PT, DPT Acute Rehabilitation Services  Pager: [phone]   Vanetta MuldersCarloine H Patrina Andreas 11/11/2017, 5:06 PM

## 2017-11-12 ENCOUNTER — Inpatient Hospital Stay (HOSPITAL_COMMUNITY): Payer: BLUE CROSS/BLUE SHIELD

## 2017-11-12 DIAGNOSIS — N179 Acute kidney failure, unspecified: Principal | ICD-10-CM

## 2017-11-12 DIAGNOSIS — D72829 Elevated white blood cell count, unspecified: Secondary | ICD-10-CM

## 2017-11-12 DIAGNOSIS — F419 Anxiety disorder, unspecified: Secondary | ICD-10-CM

## 2017-11-12 DIAGNOSIS — I10 Essential (primary) hypertension: Secondary | ICD-10-CM

## 2017-11-12 DIAGNOSIS — F329 Major depressive disorder, single episode, unspecified: Secondary | ICD-10-CM

## 2017-11-12 DIAGNOSIS — F1410 Cocaine abuse, uncomplicated: Secondary | ICD-10-CM

## 2017-11-12 DIAGNOSIS — N183 Chronic kidney disease, stage 3 (moderate): Secondary | ICD-10-CM

## 2017-11-12 DIAGNOSIS — M545 Low back pain: Secondary | ICD-10-CM

## 2017-11-12 DIAGNOSIS — G8929 Other chronic pain: Secondary | ICD-10-CM

## 2017-11-12 DIAGNOSIS — R4182 Altered mental status, unspecified: Secondary | ICD-10-CM

## 2017-11-12 DIAGNOSIS — R45851 Suicidal ideations: Secondary | ICD-10-CM

## 2017-11-12 DIAGNOSIS — F1210 Cannabis abuse, uncomplicated: Secondary | ICD-10-CM

## 2017-11-12 LAB — CULTURE, BLOOD (ROUTINE X 2)
Culture: NO GROWTH
Culture: NO GROWTH
SPECIAL REQUESTS: ADEQUATE

## 2017-11-12 LAB — PREPARE RBC (CROSSMATCH)

## 2017-11-12 MED ORDER — POLYETHYLENE GLYCOL 3350 17 G PO PACK: 17.0000 g | PACK | Freq: Two times a day (BID) | ORAL | 0 refills | Status: DC

## 2017-11-12 MED ORDER — FERROUS SULFATE 325 (65 FE) MG PO TABS: 325.0000 mg | ORAL_TABLET | Freq: Every day | ORAL | 0 refills | Status: DC

## 2017-11-12 MED ORDER — FOLIC ACID 1 MG PO TABS: 1.0000 mg | ORAL_TABLET | Freq: Every day | ORAL | Status: DC

## 2017-11-12 MED ORDER — SODIUM CHLORIDE 0.9% IV SOLUTION
Freq: Once | INTRAVENOUS | Status: AC
  Administered 2017-11-12: 11:00:00 via INTRAVENOUS

## 2017-11-12 MED ORDER — THIAMINE HCL 100 MG PO TABS: 100.0000 mg | ORAL_TABLET | Freq: Every day | ORAL | Status: DC

## 2017-11-12 MED ORDER — FERROUS SULFATE 325 (65 FE) MG PO TABS
325.0000 mg | ORAL_TABLET | Freq: Every day | ORAL | Status: DC
  Administered 2017-11-12 – 2017-11-13 (×2): 325 mg via ORAL
  Filled 2017-11-12 (×2): qty 1
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
## 2017-11-12 NOTE — Progress Notes (Signed)
11/12/2017 2:18 PM  Patient and family requesting a new psychiatric consultation.  I spoke with Dr. Sharma CovertNorman and she will see him tomorrow afternoon after clinic.  Will hold discharge for now.    Maryln Manuel. Johnson MD

## 2017-11-12 NOTE — Progress Notes (Signed)
Notified MD of pt b/p-166/104. Pt has no c/o of pain or discomfort. No new orders at this time. Will continue to monitor.

## 2017-11-12 NOTE — Discharge Instructions (Signed)
Follow with Primary MD  Tysinger, Kermit Baloavid S, PA-C  and other consultants as instructed your Hospitalist MD  Please get a complete blood count and chemistry panel checked by your Primary MD at your next visit, and again as instructed by your Primary MD.  Get Medicines reviewed and adjusted: Please take all your medications with you for your next visit with your Primary MD  Laboratory/radiological data: Please request your Primary MD to go over all hospital tests and procedure/radiological results at the follow up, please ask your Primary MD to get all Hospital records sent to his/her office.  In some cases, they will be blood work, cultures and biopsy results pending at the time of your discharge. Please request that your primary care M.D. follows up on these results.  Also Note the following: If you experience worsening of your admission symptoms, develop shortness of breath, life threatening emergency, suicidal or homicidal thoughts you must seek medical attention immediately by calling 911 or calling your MD immediately  if symptoms less severe.  You must read complete instructions/literature along with all the possible adverse reactions/side effects for all the Medicines you take and that have been prescribed to you. Take any new Medicines after you have completely understood and accpet all the possible adverse reactions/side effects.   Do not drive when taking Pain medications or sleeping medications (Benzodaizepines)  Do not take more than prescribed Pain, Sleep and Anxiety Medications. It is not advisable to combine anxiety,sleep and pain medications without talking with your primary care practitioner  Special Instructions: If you have smoked or chewed Tobacco  in the last 2 yrs please stop smoking, stop any regular Alcohol  and or any Recreational drug use.  Wear Seat belts while driving.  Please note: You were cared for by a hospitalist during your hospital stay. Once you are  discharged, your primary care physician will handle any further medical issues. Please note that NO REFILLS for any discharge medications will be authorized once you are discharged, as it is imperative that you return to your primary care physician (or establish a relationship with a primary care physician if you do not have one) for your post hospital discharge needs so that they can reassess your need for medications and monitor your lab values.   Finding Treatment for Addiction What is addiction? Addiction is a complex disease of the brain. It causes an uncontrollable (compulsive) need for a substance. You can be addicted to alcohol, illegal drugs, or prescription medicines such as painkillers. Addiction can also be a behavior, like gambling or shopping. The need for the drug or activity can become so strong that you think about it all the time. You can also become physically dependent on a substance. Addiction can change the way your brain works. Because of these changes, getting more of whatever you are addicted to becomes the most important thing to you and feels better than other activities or relationships. Addiction can lead to changes in health, behavior, emotions, relationships, and choices that affect you and everyone around you. How do I know if I need treatment for addiction? Addiction is a progressive disease. Without treatment, addiction can get worse. Living with addiction puts you at higher risk for injury, poor health, lost employment, loss of money, and even death. You might need treatment for addiction if:  You have tried to stop or cut down, but you cannot.  Your addiction is causing physical health problems.  You find it annoying that your friends and family  are concerned about your alcohol or substance use.  You feel guilty about substance abuse or a compulsive behavior.  You have lied or tried to hide your addiction.  You need a particular substance or activity to start  your day or to calm down.  You are getting in trouble at school, work, home, or with the police.  You have done something illegal to support your addiction.  You are running out of money because of your addiction.  You have no time for anything other than your addiction.  What types of treatment are available? The treatment program that is right for you will depend on many factors, including the type of addiction you have. Treatment programs can be outpatient or inpatient. In an outpatient program, you live at home and go to work or school, but you also go to a clinic for treatment. With an inpatient program, you live and sleep at the program facility during treatment. After treatment, you might need a plan for support during recovery. Other treatment options include:  Medicine. ? Some addictions may be treated with prescription medicines. ? You might also need medicine to treat anxiety or depression.  Counseling and behavior therapy. Therapy can help individuals and families behave in healthier ways and relate more effectively.  Support groups. Confidential group therapy, such as a 12-step program, can help individuals and families during treatment and recovery.  No single type of program is right for everyone. Many treatment programs involve a combination of education, counseling, and a 12-step, spiritually-based approach. Some treatment programs are government sponsored. They are geared for patients who do not have private insurance. Treatment programs can vary in many respects, such as:  Cost and types of insurance that are accepted.  Types of on-site medical services that are offered.  Length of stay, setting, and size.  Overall philosophy of treatment.  What should I consider when selecting a treatment program? It is important to think about your individual requirements when selecting a treatment program. There are a number of things to consider, such as:  If the program is  certified by the appropriate government agency. Even private programs must be certified and employ certified professionals.  If the program is covered by your insurance. If finances are a concern, the first call you should make is to your insurance company, if you have health insurance. Ask for a list of treatment programs that are in your network, and confirm any copayments and deductibles that you may have to pay. ? If you do not have insurance, or if you choose to attend a program that does not accept your insurance, discuss whether a payment plan can be set up.  If treatment is available in languages other than English, if needed.  If the program offers detoxification treatment, if needed.  If 12-step meetings are held at the center or if transport is available for patients to attend meetings at other locations.  If the program is professional, organized, and clean.  If the program meets all of your needs, including physical and cultural needs.  If the facility offers specific treatment for your particular addiction.  If support continues to be offered after you have left the program.  If your treatment plan is continually looked at to make sure you are receiving the right treatment at the right time.  If mental health counseling is part of your treatment.  If medicine is included in treatment, if needed.  If your family is included in your treatment plan  and if support is offered to them throughout the treatment process.  How the treatment works to prevent relapse.  Where else can I get help?  Your health care provider. Ask him or her to help you find addiction treatment. These discussions are confidential.  The [employer] on Alcoholism and Drug Dependence (NCADD). This group has information about treatment centers and programs for people who have an addiction and for family members. ? The telephone number is 1-800-NCA-CALL ((330)790-10621-667-754-2545). ? The website is  https://ncadd.org/about-ncadd/our-affiliates  The Substance Abuse and Mental Health Services Administration [facility](SAMHSA). This group will help you find publicly funded treatment centers, help hotlines, and counseling services near you. ? The telephone number is 1-800-662-HELP ((709)792-61131-7802956165). ? The website is www.findtreatment.[employer].plsamhsa.gov In countries outside of the [place], look in [employer] directories for contact information for services in your area. This information is not intended to replace advice given to you by your health care provider. Make sure you discuss any questions you have with your health care provider. Document Released: 02/09/2005 Document Revised: 02/07/2016 Document Reviewed: 12/30/2013 Elsevier Interactive Patient Education  2017 Elsevier Inc.   Stimulant Use Disorder-Cocaine Cocaine belongs to a group of powerful drugs known as stimulants. Common street names for cocaine include coke, crack, blow, snow, C, powder, and nose candy. Cocaine has some medical uses, but it is often misused because of the effects that it produces. These effects include:  A feeling of extreme pleasure (euphoria).  Alertness.  A high energy level.  Stimulant use disorder is when your stimulant use disrupts your daily life. It may disrupt your relationships and how you do your job. Stimulant use disorder can be dangerous. Cocaine increases your blood pressure and heart rate. Using it can lead to a heart attack or stroke. Cocaine can also make your heart rate irregular and cause seizures. These problems can lead to death. What are the causes? This condition is caused by misusing cocaine. Many people start using cocaine because it makes them feel good. Over time, they get addicted to it. When they try to stop using it, they feel sick. What increases the risk? This condition is more likely to develop in:  People who misuse other drugs.  People with a family history of misusing drugs.  What  are the signs or symptoms? Symptoms of this condition include:  Using greater amounts of cocaine than you want to, or using cocaine for longer than you want to.  Trying several times to use less cocaine or to control your cocaine use.  Craving cocaine.  Spending a lot of time getting cocaine, using it, or recovering from its effects.  Having problems at work, at school, at home, or with relationships because of cocaine use.  Giving up or cutting down on important life activities because of cocaine use.  Using cocaine when it is dangerous, such as when driving a car.  Continuing to use cocaine even though it is causing or has led to a physical problem, such as: ? Malnutrition. ? Nosebleeds. ? Chest pain. ? High blood pressure. ? A hole between the part of your nose that separates your nostrils (perforated nasal septum). ? Lung and kidney damage.  Continuing to use cocaine even though it is causing a mental problem, such as: ? Schizophrenia-like symptoms. ? Depression. ? Bipolar mood swings. ? Anxiety. ? Sleep problems.  Needing more and more cocaine to get the same effect that you want (building up a tolerance).  Having symptoms of withdrawal when you  stop using cocaine. Symptoms of withdrawal include: ? Depression. ? Irritability. ? Low energy. ? Restlessness. ? Bad dreams. ? Too little or too much sleep. ? Increased appetite.  How is this diagnosed? This condition is diagnosed with an assessment. During the assessment, your health care provider will ask about your cocaine use and about how it affects your life. Your health care provider may also:  Perform a physical exam or do lab tests to see if you have physical problems resulting from cocaine use.  Screen for drug use.  Refer you to a mental health professional for evaluation.  How is this treated? Treatment for this condition is usually provided by mental health professionals with training in substance use  disorders. Treatment may involve:  Counseling. This treatment is also called talk therapy. It is provided by substance use treatment counselors. A counselor can address the reasons you use cocaine and suggest ways to keep you from using it again. The goals of talk therapy are to: ? Find healthy activities to replace using cocaine. ? Identify and avoid what triggers your cocaine use. ? Help you learn how to handle cravings.  Support groups. Support groups are run by people who have quit using stimulants. They provide emotional support, advice, and guidance.  Medicines.  Follow these instructions at home:  Take over-the-counter and prescription medicines only as told by your health care provider.  Check with your health care provider before starting any new medicines.  Do not use any products that contain nicotine or tobacco, such as cigarettes and e-cigarettes. If you need help quitting, ask your health care provider.  Keep all follow-up visits as told by your health care provider. This is important. Where to find more information:  [employer] on Drug Abuse: http://www.price-smith.com/  Substance Abuse and Mental Health Services Administration: [employer].com.pt Contact a health care provider if:  You are not able to take your medicines as told.  You use cocaine again.  Your symptoms get worse. Get help right away if:  You have serious thoughts about hurting yourself or others.  You have a seizure.  You have chest pain.  You have sudden weakness.  You lose some of your vision.  You lose some of your speech. If you ever feel like you may hurt yourself or others, or have thoughts about taking your own life, get help right away. You can go to your nearest emergency department or call:  Your local emergency services (911 in the [place]  A suicide crisis helpline, such as the National Suicide Prevention Lifeline at (646)404-5715. This is open 24 hours a day.  This information  is not intended to replace advice given to you by your health care provider. Make sure you discuss any questions you have with your health care provider. Document Released: 03/10/2000 Document Revised: 12/24/2015 Document Reviewed: 12/24/2015 Elsevier Interactive Patient Education  [employer].

## 2017-11-12 NOTE — Progress Notes (Signed)
Patient wanted to do his AD on weekend and it was not possible until this morning when a notary and witnesses were available.  Patient was given original and 3 copies and one copy was given to unit secretary to place in his file.Benjamin [name], Chaplain   11/12/17 1000  Clinical Encounter Type  Visited With Patient  Visit Type Follow-up;Other (Comment) (completed AdvancedDirectives)  Referral From Patient  Consult/Referral To Chaplain  Spiritual Encounters  Spiritual Needs Other (Comment) (finished AD )  Advance Directives (For Healthcare)  Does Patient Have a Medical Advance Directive? Yes

## 2017-11-13 ENCOUNTER — Encounter (HOSPITAL_COMMUNITY): Payer: Self-pay | Admitting: Family Medicine

## 2017-11-13 ENCOUNTER — Other Ambulatory Visit: Payer: Self-pay

## 2017-11-13 ENCOUNTER — Encounter (HOSPITAL_COMMUNITY): Payer: Self-pay | Admitting: *Deleted

## 2017-11-13 ENCOUNTER — Inpatient Hospital Stay (HOSPITAL_COMMUNITY)
Admission: AD | Admit: 2017-11-13 | Discharge: 2017-11-21 | DRG: 885 | Payer: BLUE CROSS/BLUE SHIELD | Source: Intra-hospital | Attending: Psychiatry | Admitting: Psychiatry

## 2017-11-13 DIAGNOSIS — F410 Panic disorder [episodic paroxysmal anxiety] without agoraphobia: Secondary | ICD-10-CM | POA: Diagnosis present

## 2017-11-13 DIAGNOSIS — Z818 Family history of other mental and behavioral disorders: Secondary | ICD-10-CM

## 2017-11-13 DIAGNOSIS — G4700 Insomnia, unspecified: Secondary | ICD-10-CM | POA: Diagnosis present

## 2017-11-13 DIAGNOSIS — G8929 Other chronic pain: Secondary | ICD-10-CM | POA: Diagnosis present

## 2017-11-13 DIAGNOSIS — F1120 Opioid dependence, uncomplicated: Secondary | ICD-10-CM | POA: Diagnosis present

## 2017-11-13 DIAGNOSIS — F1020 Alcohol dependence, uncomplicated: Secondary | ICD-10-CM | POA: Diagnosis present

## 2017-11-13 DIAGNOSIS — F17210 Nicotine dependence, cigarettes, uncomplicated: Secondary | ICD-10-CM | POA: Diagnosis present

## 2017-11-13 DIAGNOSIS — F1410 Cocaine abuse, uncomplicated: Secondary | ICD-10-CM | POA: Diagnosis not present

## 2017-11-13 DIAGNOSIS — D631 Anemia in chronic kidney disease: Secondary | ICD-10-CM | POA: Diagnosis present

## 2017-11-13 DIAGNOSIS — R45851 Suicidal ideations: Secondary | ICD-10-CM | POA: Diagnosis present

## 2017-11-13 DIAGNOSIS — Z915 Personal history of self-harm: Secondary | ICD-10-CM

## 2017-11-13 DIAGNOSIS — F329 Major depressive disorder, single episode, unspecified: Secondary | ICD-10-CM

## 2017-11-13 DIAGNOSIS — R4182 Altered mental status, unspecified: Secondary | ICD-10-CM | POA: Diagnosis not present

## 2017-11-13 DIAGNOSIS — J302 Other seasonal allergic rhinitis: Secondary | ICD-10-CM | POA: Diagnosis present

## 2017-11-13 DIAGNOSIS — F332 Major depressive disorder, recurrent severe without psychotic features: Secondary | ICD-10-CM | POA: Diagnosis present

## 2017-11-13 DIAGNOSIS — F1920 Other psychoactive substance dependence, uncomplicated: Secondary | ICD-10-CM

## 2017-11-13 DIAGNOSIS — N183 Chronic kidney disease, stage 3 (moderate): Secondary | ICD-10-CM | POA: Diagnosis present

## 2017-11-13 DIAGNOSIS — F1210 Cannabis abuse, uncomplicated: Secondary | ICD-10-CM | POA: Diagnosis not present

## 2017-11-13 DIAGNOSIS — F1420 Cocaine dependence, uncomplicated: Secondary | ICD-10-CM | POA: Diagnosis present

## 2017-11-13 DIAGNOSIS — F419 Anxiety disorder, unspecified: Secondary | ICD-10-CM

## 2017-11-13 DIAGNOSIS — N179 Acute kidney failure, unspecified: Secondary | ICD-10-CM | POA: Diagnosis not present

## 2017-11-13 DIAGNOSIS — F32A Depression, unspecified: Secondary | ICD-10-CM

## 2017-11-13 DIAGNOSIS — I129 Hypertensive chronic kidney disease with stage 1 through stage 4 chronic kidney disease, or unspecified chronic kidney disease: Secondary | ICD-10-CM | POA: Diagnosis present

## 2017-11-13 DIAGNOSIS — Z0000 Encounter for general adult medical examination without abnormal findings: Secondary | ICD-10-CM

## 2017-11-13 LAB — HEPATIC FUNCTION PANEL
ALK PHOS: 29 U/L — AB (ref 38–126)
ALT: 74 U/L — AB (ref 0–44)
AST: 70 U/L — ABNORMAL HIGH (ref 15–41)
Albumin: 3.2 g/dL — ABNORMAL LOW (ref 3.5–5.0)
BILIRUBIN DIRECT: 0.1 mg/dL (ref 0.0–0.2)
BILIRUBIN INDIRECT: 1.1 mg/dL — AB (ref 0.3–0.9)
Total Bilirubin: 1.2 mg/dL (ref 0.3–1.2)
Total Protein: 4.5 g/dL — ABNORMAL LOW (ref 6.5–8.1)

## 2017-11-13 LAB — CBC
HCT: 28.5 % — ABNORMAL LOW (ref 39.0–52.0)
HEMOGLOBIN: 8.8 g/dL — AB (ref 13.0–17.0)
MCH: 25.9 pg — AB (ref 26.0–34.0)
MCHC: 30.9 g/dL (ref 30.0–36.0)
MCV: 83.8 fL (ref 78.0–100.0)
Platelets: 154 10*3/uL (ref 150–400)
RBC: 3.4 MIL/uL — AB (ref 4.22–5.81)
RDW: 17.3 % — ABNORMAL HIGH (ref 11.5–15.5)
WBC: 6 10*3/uL (ref 4.0–10.5)

## 2017-11-13 LAB — BPAM RBC
Blood Product Expiration Date: 201909082359
ISSUE DATE / TIME: 201908191136
UNIT TYPE AND RH: 6200

## 2017-11-13 LAB — BASIC METABOLIC PANEL
Anion gap: 4 — ABNORMAL LOW (ref 5–15)
BUN: 9 mg/dL (ref 6–20)
CHLORIDE: 109 mmol/L (ref 98–111)
CO2: 27 mmol/L (ref 22–32)
Calcium: 8.1 mg/dL — ABNORMAL LOW (ref 8.9–10.3)
Creatinine, Ser: 1.93 mg/dL — ABNORMAL HIGH (ref 0.61–1.24)
GFR calc Af Amer: 45 mL/min — ABNORMAL LOW (ref >60)
GFR calc non Af Amer: 39 mL/min — ABNORMAL LOW (ref >60)
GLUCOSE: 97 mg/dL (ref 70–99)
POTASSIUM: 3.7 mmol/L (ref 3.5–5.1)
Sodium: 140 mmol/L (ref 135–145)

## 2017-11-13 LAB — TYPE AND SCREEN
ABO/RH(D): A POS
ANTIBODY SCREEN: NEGATIVE
Unit division: 0

## 2017-11-13 MED ORDER — FERROUS SULFATE 325 (65 FE) MG PO TABS
325.0000 mg | ORAL_TABLET | Freq: Every day | ORAL | Status: DC
  Administered 2017-11-14 – 2017-11-21 (×8): 325 mg via ORAL
  Filled 2017-11-13 (×9): qty 1

## 2017-11-13 MED ORDER — AMLODIPINE BESYLATE 5 MG PO TABS
5.0000 mg | ORAL_TABLET | Freq: Every day | ORAL | Status: DC
  Administered 2017-11-13: 5 mg via ORAL
  Filled 2017-11-13: qty 1

## 2017-11-13 MED ORDER — LORATADINE 10 MG PO TABS
10.0000 mg | ORAL_TABLET | Freq: Every day | ORAL | Status: DC | PRN
  Administered 2017-11-13: 10 mg via ORAL
  Filled 2017-11-13: qty 1

## 2017-11-13 MED ORDER — POLYETHYLENE GLYCOL 3350 17 G PO PACK
17.0000 g | PACK | Freq: Two times a day (BID) | ORAL | Status: DC
  Filled 2017-11-13 (×18): qty 1

## 2017-11-13 MED ORDER — VITAMIN B-1 100 MG PO TABS
100.0000 mg | ORAL_TABLET | Freq: Every day | ORAL | Status: DC
  Administered 2017-11-14 – 2017-11-21 (×8): 100 mg via ORAL
  Filled 2017-11-13 (×9): qty 1

## 2017-11-13 MED ORDER — AMLODIPINE BESYLATE 5 MG PO TABS: 5.0000 mg | ORAL_TABLET | Freq: Every day | ORAL | Status: DC

## 2017-11-13 MED ORDER — POLYETHYLENE GLYCOL 3350 17 G PO PACK
17.0000 g | PACK | Freq: Two times a day (BID) | ORAL | Status: DC
  Filled 2017-11-13: qty 1

## 2017-11-13 MED ORDER — FOLIC ACID 1 MG PO TABS
1.0000 mg | ORAL_TABLET | Freq: Every day | ORAL | Status: DC
  Administered 2017-11-14 – 2017-11-21 (×8): 1 mg via ORAL
  Filled 2017-11-13 (×9): qty 1

## 2017-11-13 MED ORDER — NICOTINE 21 MG/24HR TD PT24: 21.0000 mg | MEDICATED_PATCH | Freq: Every day | TRANSDERMAL | Status: DC | PRN

## 2017-11-13 MED ORDER — HYDROCODONE-ACETAMINOPHEN 10-325 MG PO TABS
1.0000 | ORAL_TABLET | ORAL | Status: DC | PRN
  Administered 2017-11-13 – 2017-11-21 (×41): 1 via ORAL
  Filled 2017-11-13 (×42): qty 1

## 2017-11-13 MED ORDER — HEPARIN SODIUM (PORCINE) 5000 UNIT/ML IJ SOLN
5000.0000 [IU] | Freq: Three times a day (TID) | INTRAMUSCULAR | Status: DC
  Filled 2017-11-13 (×8): qty 1

## 2017-11-13 MED ORDER — NAPHAZOLINE-PHENIRAMINE 0.025-0.3 % OP SOLN: 1.0000 [drp] | Freq: Four times a day (QID) | OPHTHALMIC | Status: DC | PRN

## 2017-11-13 MED ORDER — AMLODIPINE BESYLATE 5 MG PO TABS
5.0000 mg | ORAL_TABLET | Freq: Every day | ORAL | Status: DC
  Administered 2017-11-13 – 2017-11-21 (×9): 5 mg via ORAL
  Filled 2017-11-13 (×10): qty 1

## 2017-11-13 MED ORDER — LORATADINE 10 MG PO TABS: 10.0000 mg | ORAL_TABLET | Freq: Every day | ORAL | Status: DC | PRN

## 2017-11-13 MED ORDER — CLONAZEPAM 0.25 MG PO TBDP: 0.2500 mg | ORAL_TABLET | Freq: Two times a day (BID) | ORAL | Status: DC | PRN

## 2017-11-13 MED ORDER — SENNOSIDES-DOCUSATE SODIUM 8.6-50 MG PO TABS
1.0000 | ORAL_TABLET | Freq: Two times a day (BID) | ORAL | Status: DC
  Administered 2017-11-20: 1 via ORAL
  Filled 2017-11-13 (×18): qty 1

## 2017-11-13 NOTE — Progress Notes (Signed)
[facility], no [facility] answered. Will try again shortly.

## 2017-11-13 NOTE — Consult Note (Signed)
[facility] Psych Consult Progress Note  11/13/2017 10:19 AM Benjamin [name]  MRN:  [id number] Subjective:   Benjamin [name] was last seen by the psychiatry consult service on 8/16. He was recommended to follow up with his outpatient provider for medication management and provided substance abuse treatment resources by SW. He endorsed SI upon discharge yesterday so psychiatry was reconsulted for suicide risk assessment.   On interview, Benjamin [name] reports SI with a plan to cut himself.  He reports that the suicidal thoughts have increased in frequency due to worsening anxiety when he thinks about discharging home.  He believes that "something bad will happen" to him.  He reports attempting suicide twice after his mother passed away 2 years ago.  He was admitted to [facility] at this time and felt like he improved with treatment.  He reports withdrawing from drugs.  He endorses cold sweats, stomach cramps and hypersomnia.  He now reports that he was using heroin as well as cocaine prior to admission.  He declines medication management for current symptoms.  He reports poor sleep due to waking up with panic attacks at night.  He denies problems falling asleep.  He reports cravings for the substances.  He denies HI or AVH.  Principal Problem: Anxiety and depression Diagnosis:   Patient Active Problem List   Diagnosis Date Noted  . Cocaine use disorder ([place]) [F14.10]   . AKI (acute kidney injury) ([place]) [N17.9] 11/07/2017  . Leukocytosis [D72.829] 11/07/2017  . Chronic kidney disease (CKD), stage III (moderate) ([place]) [N18.3] 09/19/2016  . Elevated LFTs [R94.5] 09/19/2016  . MDD (major depressive disorder), recurrent episode, severe ([place]) [F33.2] 08/24/2015  . Insomnia [G47.00]   . MDD (major depressive disorder), single episode, severe , no psychosis ([place]) [F32.2] 08/14/2015  . Cannabis use disorder, mild, abuse [F12.10] 08/14/2015  . Cocaine abuse ([place]) [F14.10] 08/10/2015  . Weight loss, non-intentional [R63.4]  08/10/2015  . Depression [F32.9] 08/10/2015  . Essential hypertension [I10]   . Vaccine refused by patient [Z28.20] 05/24/2015  . Routine general medical examination at a health care facility [Z00.00] 05/24/2015  . Chronic back pain [M54.9, G89.29] 05/24/2015  . Family history of cancer [Z80.9] 05/24/2015  . Screening for prostate cancer [Z12.5] 05/24/2015  . Smoker [F17.200] 05/24/2015  . Screen for colon cancer [Z12.11] 05/24/2015  . Erectile dysfunction [N52.9] 01/31/2014   Total Time spent with patient: 15 minutes  Past Psychiatric History: MDD, anxiety and PTSD. History of suicide attempt by cutting. He reports a history of multiple suicide attempts as a teenager.   Past Medical History:  Past Medical History:  Diagnosis Date  . Chronic back pain    managed [facility]  . CKD (chronic kidney disease), stage III ([place]) 10/2017  . Erectile dysfunction   . History of substance abuse   . Hx of suicide attempt   . Hypertension     Past Surgical History:  Procedure Laterality Date  . ANKLE SURGERY     right  . APPLICATION OF WOUND VAC Right 08/17/2015   Procedure: APPLICATION OF WOUND VAC;  Surgeon: Roseanne Kaufman, MD;  Location: [place];  Service: Orthopedics;  Laterality: Right;  . HAND SURGERY     fracture, ORIG, teenage years, right.  . I&D EXTREMITY Right 08/10/2015   Procedure: IRRIGATION AND DEBRIDEMENT SKIN, SUBCUTANEOS TISSUE AND  MUSCLE, CARPAL TUNNEL RELEASE, MEDIAN NERVE LYSIS WITH NERVE WRAP;  Surgeon: Roseanne Kaufman, MD;  Location: [place];  Service: Orthopedics;  Laterality: Right;  .  INCISION AND DRAINAGE OF WOUND Right 08/15/2015   Procedure: IRRIGATION AND DEBRIDEMENT WOUND;  Surgeon: Justice Britain, MD;  Location: [facility];  Service: Orthopedics;  Laterality: Right;  . LUMBAR EPIDURAL INJECTION     [facility]  . SKIN SPLIT GRAFT Right 08/17/2015   Procedure: I&D RIGHT FOREARM/POSSIBLE SKIN GRAFT;  Surgeon: Roseanne Kaufman, MD;  Location:  [place];  Service: Orthopedics;  Laterality: Right;  . TENDON REPAIR N/A 07/24/2015   Procedure: WRIST LACERATION AND TENDON REPAIR;  Surgeon: Iran Planas, MD;  Location: [place];  Service: Orthopedics;  Laterality: N/A;  . WOUND EXPLORATION Right 08/15/2015   Procedure: WOUND EXPLORATION right forearm;  Surgeon: Justice Britain, MD;  Location: [facility];  Service: Orthopedics;  Laterality: Right;   Family History:  Family History  Problem Relation Age of Onset  . Cancer Mother        lung  . Anxiety disorder Mother   . Cancer Father        throat  . Diabetes Paternal Grandmother   . Cancer Paternal Aunt   . Heart disease Neg Hx   . Stroke Neg Hx    Family Psychiatric  History: His mother had mental illness and was hospitalized. He is unsure of her diagnosis but reports that she had a "nervous breakdown."   Social History:  Social History   Substance and Sexual Activity  Alcohol Use Yes  . Alcohol/week: 0.0 standard drinks   Comment: occasional     Social History   Substance and Sexual Activity  Drug Use Yes  . Types: Marijuana, Cocaine   Comment: last use cocaine 08/08/15, last use MJ 08/08/15    Social History   Socioeconomic History  . Marital status: Single    Spouse name: Not on file  . Number of children: Not on file  . Years of education: Not on file  . Highest education level: Not on file  Occupational History  . Not on file  Social Needs  . Financial resource strain: Not on file  . Food insecurity:    Worry: Not on file    Inability: Not on file  . Transportation needs:    Medical: Not on file    Non-medical: Not on file  Tobacco Use  . Smoking status: Current Every Day Smoker    Packs/day: 0.50    Years: 6.00    Pack years: 3.00    Types: Cigarettes  . Smokeless tobacco: Never Used  Substance and Sexual Activity  . Alcohol use: Yes    Alcohol/week: 0.0 standard drinks    Comment: occasional  . Drug use: Yes    Types: Marijuana, Cocaine    Comment:  last use cocaine 08/08/15, last use MJ 08/08/15  . Sexual activity: Not on file  Lifestyle  . Physical activity:    Days per week: Not on file    Minutes per session: Not on file  . Stress: Not on file  Relationships  . Social connections:    Talks on phone: Not on file    Gets together: Not on file    Attends religious service: Not on file    Active member of club or organization: Not on file    Attends meetings of clubs or organizations: Not on file    Relationship status: Not on file  Other Topics Concern  . Not on file  Social History Narrative   Single,prior worked as Geophysicist/field seismologist at [employer].   No  children.  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   09/2017.     Sleep: Fair  Appetite:  Good  Current Medications: Current Facility-Administered Medications  Medication Dose Route Frequency Provider Last Rate Last Dose  . 0.9 %  sodium chloride infusion (Manually program via Guardrails IV Fluids)   Intravenous Once Hosie Poisson, MD      . 0.9 %  sodium chloride infusion   Intravenous Continuous Johnson, Clanford L, MD 50 mL/hr at 11/12/17 0827    . amLODipine (NORVASC) tablet 5 mg  5 mg Oral Daily Johnson, Clanford L, MD   5 mg at 11/13/17 0949  . clonazePAM (KLONOPIN) disintegrating tablet 0.25 mg  0.25 mg Oral BID PRN Hosie Poisson, MD      . ferrous sulfate tablet 325 mg  325 mg Oral Q breakfast Wynetta Emery, Clanford L, MD   325 mg at 11/13/17 0859  . folic acid (FOLVITE) tablet 1 mg  1 mg Oral Daily Eulogio Bear U, DO   1 mg at 11/13/17 0949  . heparin injection 5,000 Units  5,000 Units Subcutaneous Q8H Eulogio Bear U, DO   5,000 Units at 11/13/17 0608  . HYDROcodone-acetaminophen (NORCO) 10-325 MG per tablet 1 tablet  1 tablet Oral Q4H PRN Hosie Poisson, MD   1 tablet at 11/13/17 0859  . loratadine (CLARITIN) tablet 10 mg  10 mg Oral Daily PRN Wynetta Emery, Clanford L, MD   10 mg at 11/13/17 0609  . naphazoline-pheniramine (NAPHCON-A) 0.025-0.3 %  ophthalmic solution 1 drop  1 drop Both Eyes QID PRN Hosie Poisson, MD   1 drop at 11/12/17 0835  . nicotine (NICODERM CQ - dosed in mg/24 hours) patch 21 mg  21 mg Transdermal Daily PRN Johnson, Clanford L, MD      . ondansetron (ZOFRAN) tablet 4 mg  4 mg Oral Q6H PRN Eliseo Squires, Jessica U, DO       Or  . ondansetron (ZOFRAN) injection 4 mg  4 mg Intravenous Q6H PRN Vann, Jessica U, DO      . polyethylene glycol (MIRALAX / GLYCOLAX) packet 17 g  17 g Oral BID Johnson, Clanford L, MD      . senna-docusate (Senokot-S) tablet 1 tablet  1 tablet Oral BID Hosie Poisson, MD   1 tablet at 11/12/17 2153  . thiamine (VITAMIN B-1) tablet 100 mg  100 mg Oral Daily Eulogio Bear U, DO   100 mg at 11/13/17 6010    Lab Results:  Results for orders placed or performed during the hospital encounter of 11/07/17 (from the past 48 hour(s))  Prepare RBC     Status: None   Collection Time: 11/12/17  8:58 AM  Result Value Ref Range   Order Confirmation      ORDER PROCESSED BY BLOOD BANK Performed at [facility], [place] [address] [place], [place] [address]   Type and screen [place]     Status: None   Collection Time: 11/12/17  9:00 AM  Result Value Ref Range   ABO/RH(D) A POS    Antibody Screen NEG    Sample Expiration 11/15/2017    Unit Number [id number]    Blood Component Type RED CELLS,LR    Unit division 00    Status of Unit ISSUED,FINAL    Transfusion Status OK TO TRANSFUSE    Crossmatch Result      Compatible Performed at [facility], [place] [address], [place], [place] [address]   Hepatic function panel  Status: Abnormal   Collection Time: 11/13/17  6:13 AM  Result Value Ref Range   Total Protein 4.5 (L) 6.5 - 8.1 g/dL   Albumin 3.2 (L) 3.5 - 5.0 g/dL   AST 70 (H) 15 - 41 U/L   ALT 74 (H) 0 - 44 U/L   Alkaline Phosphatase 29 (L) 38 - 126 U/L   Total Bilirubin 1.2 0.3 - 1.2 mg/dL   Bilirubin, Direct 0.1 0.0 - 0.2 mg/dL   Indirect Bilirubin 1.1 (H) 0.3 - 0.9  mg/dL    Comment: Performed at [facility] [address] [place], [place] [address]  CBC     Status: Abnormal   Collection Time: 11/13/17  6:33 AM  Result Value Ref Range   WBC 6.0 4.0 - 10.5 K/uL   RBC 3.40 (L) 4.22 - 5.81 MIL/uL   Hemoglobin 8.8 (L) 13.0 - 17.0 g/dL   HCT 28.5 (L) 39.0 - 52.0 %   MCV 83.8 78.0 - 100.0 fL   MCH 25.9 (L) 26.0 - 34.0 pg   MCHC 30.9 30.0 - 36.0 g/dL   RDW 17.3 (H) 11.5 - 15.5 %   Platelets 154 150 - 400 K/uL    Comment: Performed at [facility], [place] [address], [place], [place] [address]  Basic metabolic panel     Status: Abnormal   Collection Time: 11/13/17  6:33 AM  Result Value Ref Range   Sodium 140 135 - 145 mmol/L   Potassium 3.7 3.5 - 5.1 mmol/L   Chloride 109 98 - 111 mmol/L   CO2 27 22 - 32 mmol/L   Glucose, Bld 97 70 - 99 mg/dL   BUN 9 6 - 20 mg/dL   Creatinine, Ser 1.93 (H) 0.61 - 1.24 mg/dL   Calcium 8.1 (L) 8.9 - 10.3 mg/dL   GFR calc non Af Amer 39 (L) >60 mL/min   GFR calc Af Amer 45 (L) >60 mL/min    Comment: (NOTE) The eGFR has been calculated using the CKD EPI equation. This calculation has not been validated in all clinical situations. eGFR's persistently <60 mL/min signify possible Chronic Kidney Disease.    Anion gap 4 (L) 5 - 15    Comment: Performed at [facility] [address] [place], [place] [address]    Blood Alcohol level:  Lab Results  Component Value Date   [facility] <10 11/07/2017   ETH <5 08/10/2015    Musculoskeletal: Strength & Muscle Tone: within normal limits Gait & Station: UTA since patient was lying in bed. Patient leans: N/A  Psychiatric Specialty Exam: Physical Exam  Nursing note and vitals reviewed. Constitutional: He is oriented to person, place, and time. He appears well-developed and well-nourished.  HENT:  Head: Normocephalic and atraumatic.  Neck: Normal range of motion.  Respiratory: Effort normal.  Musculoskeletal: Normal range of motion.  Neurological: He  is alert and oriented to person, place, and time.  Skin: No rash noted.  Psychiatric: His speech is normal and behavior is normal. Judgment and thought content normal. Cognition and memory are normal. He exhibits a depressed mood.    Review of Systems  Constitutional: Negative for chills and fever.  Cardiovascular: Negative for chest pain.  Gastrointestinal: Negative for abdominal pain, constipation, diarrhea, nausea and vomiting.  Psychiatric/Behavioral: Positive for depression, substance abuse and suicidal ideas. Negative for hallucinations. The patient is nervous/anxious. The patient does not have insomnia.   All other systems reviewed and are negative.   Blood  pressure (!) 150/90, pulse 62, temperature 98.4 F (36.9 C), temperature source Oral, resp. rate 18, height _0  (1.88 m), weight 90.8 kg, SpO2 100 %.Body mass index is 25.7 kg/m.  General Appearance: Fairly Groomed, middle aged, athletic build, [place] American male, wearing casual clothes and lying in bed. NAD.   Eye Contact:  Good  Speech:  Clear and Coherent and Normal Rate  Volume:  Normal  Mood:  Depressed  Affect:  Constricted  Thought Process:  Goal Directed, Linear and Descriptions of Associations: Intact  Orientation:  Full (Time, Place, and Person)  Thought Content:  Logical  Suicidal Thoughts:  Yes.  with intent/plan  Homicidal Thoughts:  No  Memory:  Immediate;   Good Recent;   Good Remote;   Good  Judgement:  Fair  Insight:  Fair  Psychomotor Activity:  Normal  Concentration:  Concentration: Good and Attention Span: Good  Recall:  Good  Fund of Knowledge:  Good  Language:  Good  Akathisia:  No  Handed:  Right  AIMS (if indicated):   N/A  Assets:  Communication Skills Desire for Improvement Financial Resources/Insurance Housing Social Support  ADL's:  Intact  Cognition:  WNL  Sleep:   He endorses hypersomnia.    Assessment: Cy Bresee is a 52 y.o. male who was admitted with metabolic  encephalopathy. He reported relapsing on drugs in the setting of medication changes. He reports recurrent SI and today endorses a plan to cut in the setting of depression and anxiety as well as multiple psychosocial stressors. He warrants inpatient psychiatric hospitalization for stabilization and treatment.    Treatment Plan Summary: -Patient warrants inpatient psychiatric hospitalization given high risk of harm to self. -Continue Engineer, materials.  -Patient declines medication management at this time and would like defer to inpatient psychiatric team.  -Please pursue involuntary commitment if patient refuses voluntary psychiatric hospitalization or attempts to leave the hospital.  -Will sign off on patient at this time. Please consult psychiatry again as needed.     Faythe Dingwall, DO 11/13/2017, 10:19 AM

## 2017-11-13 NOTE — Progress Notes (Signed)
Report called to Marchelle FolksAmanda, RN [facility].

## 2017-11-13 NOTE — Clinical Social Work Note (Addendum)
CSW advised by attending MD that recommendation for Mr. Benjamin [name] is an inpatient psychiatric facility. CSW talked with patient regarding his current suicidal ideation. Patient currently has a suicide sitter at the bedside. Mr. Benjamin [name] reported to this CSW that he is currently suicidal and would use a razor to end his life. Patient informed that efforts underway to locate a psychiatric facility that can accept him and patient expressed understanding and agreement.  Ref[facility] made to [facility]. Patient is going voluntarily and Voluntary Admission form completed and signed by Mr. Benjamin [name]. [facility] for  6 pm and nurse provided with information to call report. Dr. Sharma CovertNorman is accept MD, the attending is Dr. Margie Billetobbs and patient assigned to room 302-Bed 2. [facility]. CSW signing off as no other SW intervention services needed.  Genelle BalVanessa Hajer Dwyer, MSW, LCSW Licensed Clinical Social Worker Clinical Social Work Department [facility] [phone]

## 2017-11-13 NOTE — Progress Notes (Addendum)
Physical Therapy Treatment Patient Details Name: Benjamin HaagensenJohn [name] MRN: [id number] DOB: [birth year] Today's Date: 11/13/2017    History of Present Illness Pt is a 52 y.o. M with signficant PMH of chronic pain, HTN, substance abuse who was brought to ED after he was found minimally responsive in bed room of a hotel with feces. Reports he was partying with friends and occasionally uses cocaine. On arrival he was hypotensive, tachycardic, and in altered mental status.    PT Comments    Continuing work on functional mobility and activity tolerance;  Session focused on completing stair training in anticipation of DC home; Recommend Outpt PT to address prox LE muscle strengthening to normalize gait, and Outpt PT can address chronic back pain as well; Will continue to follow acutely to address weakness and gait deviations while here   Follow Up Recommendations  Outpatient PT;Supervision - Intermittent     Equipment Recommendations  None recommended by PT    Recommendations for Other Services       Precautions / Restrictions Precautions Precautions: Fall Precaution Comments: Fall risk is much lower now compared to admission  Restrictions Weight Bearing Restrictions: No    Mobility  Bed Mobility                  Transfers Overall transfer level: Independent                  Ambulation/Gait Ambulation/Gait assistance: Min guard;Supervision Gait Distance (Feet): 110 Feet Assistive device: None Gait Pattern/deviations: Step-through pattern;Decreased stride length;Wide base of support Gait velocity: decr   General Gait Details: Minguard progressing to Supervision; Cues for gluteal activation, and to stand tall on both LEs during stance; good correction of Trendelenburg gait pattern with those cues, focus, and taking slow steps   Stairs Stairs: Yes Stairs assistance: Min guard(without physical contact) Stair Management: One rail Right;Forwards;Alternating  pattern(Descended sideways step-to pattern) Number of Stairs: 12 General stair comments: Took things slowly, but managing well; good use of R rail for stability   Wheelchair Mobility    Modified Rankin (Stroke Patients Only)       Balance                                            Cognition Arousal/Alertness: Awake/alert Behavior During Therapy: WFL for tasks assessed/performed Overall Cognitive Status: Within Functional Limits for tasks assessed                                        Exercises      General Comments        Pertinent Vitals/Pain Pain Assessment: 0-10 Pain Score: 8  Pain Location: low back Pain Descriptors / Indicators: Aching Pain Intervention(s): Monitored during session;Patient requesting pain meds-RN notified    Home Living                      Prior Function            PT Goals (current goals can now be found in the care plan section) Acute Rehab PT Goals Patient Stated Goal: walk normally PT Goal Formulation: With patient Time For Goal Achievement: 11/24/17 Potential to Achieve Goals: Good Progress towards PT goals: Progressing toward goals    Frequency    Min 3X/week  PT Plan Current plan remains appropriate    Co-evaluation              AM-PAC PT "6 Clicks" Daily Activity  Outcome Measure  Difficulty turning over in bed (including adjusting bedclothes, sheets and blankets)?: None Difficulty moving from lying on back to sitting on the side of the bed? : None Difficulty sitting down on and standing up from a chair with arms (e.g., wheelchair, bedside commode, etc,.)?: None Help needed moving to and from a bed to chair (including a wheelchair)?: None Help needed walking in hospital room?: None Help needed climbing 3-5 steps with a railing? : A Little 6 Click Score: 23    End of Session   Activity Tolerance: Patient tolerated treatment well Patient left: Other  (comment);with family/visitor present;with call bell/phone within reach(seated EOB) Nurse Communication: Mobility status;Patient requests pain meds PT Visit Diagnosis: Unsteadiness on feet (R26.81);Difficulty in walking, not elsewhere classified (R26.2)     Time: 0850-0900 PT Time Calculation (min) (ACUTE ONLY): 10 min  Charges:  $Gait Training: 8-22 mins                     Van ClinesHolly Anelise Staron, [place]PT  Acute Rehabilitation Services Pager [phone] Office [phone]    Levi AlandHolly H Marcel Gary 11/13/2017, 9:08 AM

## 2017-11-13 NOTE — Progress Notes (Addendum)
PROGRESS NOTE   Benjamin [name]  ZOX:[id number] DOB: Sep 07, 1965 DOA: 11/07/2017 PCP: Jac Canavan, PA-C   Brief Narrative: Benjamin [name] is a 52 y.o. male with medical history significant of chronic pain, HTN, and substance abuse was brought to ED, after he was found minimally responsive in the bed room of a hotel with feces. He reports taht he was partying with friends and occasionally uses cocaine. On arrival to he was hypotensive, tachycardic and in altered mental status. He was admitted for further evaluation.  The patient has developed acute psychiatric changes and became suicidal and has been seen by psychiatry and recommended for inpatient behavioral health treatment.     Assessment & Plan:   Active Problems:   Chronic back pain   Cocaine abuse (HCC)   Essential hypertension   Cannabis use disorder, mild, abuse   Chronic kidney disease (CKD), stage III (moderate) (HCC)   AKI (acute kidney injury) (HCC)   Leukocytosis   Cocaine use disorder (HCC)  Acute metabolic encephalopathy: secondary to ARF.  Resolved.pt appears to be back to baseline.  EEG on admission does not show any seizure activity.  Acute on stage 3 CKD:  Probably pre renal azotemia from dehydration.  hdyrate and repeat renal parameters in am show improvement.  UA does not show any infection. CT abd does not show any hydronephrosis.  Baseline creatinine at 1.3 , on admission was 3.24> 3.20>2.80>2.34> 1.85>1.72. Creatinine now at baseline.  Monitor urine output. Following labs.    Substance use disorder:  Pt UDS was positive for cocaine. Social work consulted for resources.   H/o anxiety, depression and PTSD with insomnia and nightmares: As per the fiance at bedside, he is being weaned of Trintellix, and is not taking prozac.  Psychiatry consulted for meds and recommendations.   Suicidal Ideations - Discharge was held due to acute worsening in mental status.  Pt now reporting suicidal ideations.  I asked  Dr. Sharma Covert to come and reassess him.  She did that and now recommending inpatient behavioral health stabilization.  Suicide precautions initiated.  I have consulted the social worker to assist with placement in behavioral health facility for inpatient treatment.  Pt is medically stable to discharge to inpatient psychiatric facility for treatment.     Hypotension resolved.    Hypertension: suboptimally controlled, restarted him on amlodipine 8/20.   Metabolic acidosis: Resolved now.   Rhabdomyolysis:  Non traumatic, Resolved now.   Elevated troponin and lactic acid : Resolved.  Elevated troponin likely from demand ischemia from cocaine use and dehydration. echocardiogram ordered, which showed normal LVEF and no regional wall abnormalities.  Repeat EKG ordered does not show ischemic changes.  Elevated lactic acid from dehydration.  Lactic acid normalized.   Pt reports some dizziness, chest heaviness on ambulation and sob: He was empirically started on IV antibiotics for aspiration pneumonia.  He has completed a course of augmentin.    Anemia:  Unclear etiology.  Appears microcytic.  He had a laceration to the elbow which was bandaged and there is no more bleeding from it.  Anemia panel ordered and reviewed.   Stool for occult blood ordered and is negative.  2 unit of prbc transfusion ordered. Repeat H&H I greater than 7. Transfused 1 additional unit of PRBC 8/19.  Hg now up to 8.8.    Chronic pain syndrome: On hydrocodone every 4 hours prn for the back pain.   Unsteady gait when working with PT:  Outpatient PT recommended.   Unclear etiology.  Initial CT head is negative.  MRI brain without contrast : no acute findings, poor images as patient became too anxious to complete study.   DVT prophylaxis: sq heparin.   Code Status:  Full code.  Family Communication: fiance at bedside.  Disposition Plan: Pt is medically stable for transfer to inpatient psychiatric facility for  treatment  Consultants:   Psychiatry.   Procedures:Echocardiogram.  Study Conclusions - Left ventricle: The cavity size was normal. Systolic function was normal. The estimated ejection fraction was in the range of 60% to 65%. Wall motion was normal; there were no regional wall motion abnormalities. Left ventricular diastolic function parameters were normal. - Aortic valve: There was no regurgitation. - Aortic root: The aortic root was normal in size. - Mitral valve: There was mild regurgitation. - Right ventricle: The cavity size was normal. Wall thickness was normal. Systolic function was normal. - Right atrium: The atrium was normal in size. - Tricuspid valve: There was moderate regurgitation. - Pulmonary arteries: Systolic pressure was moderately increased. PA peak pressure: 52 mm Hg (S). - Inferior vena cava: The vessel was normal in size. - Pericardium, extracardiac: There was no pericardial effusion.  Subjective: Pt says that he is having panic attacks and night terrors.    Objective: Vitals:   11/12/17 2200 11/13/17 0044 11/13/17 0446 11/13/17 0835  BP: (!) 154/96  (!) 143/84 (!) 150/90  Pulse:   (!) 53 62  Resp:  18 18   Temp:   98.1 F (36.7 C) 98.4 F (36.9 C)  TempSrc:   Oral Oral  SpO2:  98% 97% 100%  Weight:      Height:        Intake/Output Summary (Last 24 hours) at 11/13/2017 1519 Last data filed at 11/13/2017 0900 Gross per 24 hour  Intake 680 ml  Output -  Net 680 ml   Filed Weights   11/07/17 1722 11/07/17 2056 11/08/17 2104  Weight: 90.7 kg 90.7 kg 90.8 kg    Examination:  General exam: awake, alert, NAD, calm and comfortable,  Respiratory system: BBS CTA.  no wheezing or rhonchi Cardiovascular system: S1 & S2 heard, RRR. No JVD.  No pedal edema. Gastrointestinal system: Abdomen is soft NT nd bs+ Central nervous system: Alert and oriented. Non focal.  Extremities: Symmetric 5 x 5 power. Skin: No rashes, lesions or  ulcers Psychiatry: . Mood & affect appropriate.   Data Reviewed: I have personally reviewed following labs and imaging studies  CBC: Recent Labs  Lab 11/07/17 1247  11/08/17 0308  11/08/17 2152 11/09/17 1457 11/10/17 0458 11/11/17 0501 11/13/17 0633  WBC 15.6*  --  11.7*  --   --  5.1 5.0 4.8 6.0  NEUTROABS 12.9*  --   --   --   --   --   --   --   --   HGB 10.5*   < > 6.9*   < > 6.6* 7.5* 7.2* 7.5* 8.8*  HCT 33.7*   < > 21.5*   < > 20.1* 23.2* 22.3* 24.3* 28.5*  MCV 77.8*  --  75.4*  --   --  80.0 81.1 82.9 83.8  PLT 215  --  137*  --   --  105* 107* 123* 154   < > = values in this interval not displayed.   Basic Metabolic Panel: Recent Labs  Lab 11/07/17 1247  11/08/17 0308 11/09/17 [id number] 11/10/17 0458 11/11/17 0501 11/13/17 0633  NA 141   < > 140 141  142 143 140  K 3.3*   < > 3.8 3.5 3.7 3.8 3.7  CL 106   < > 110 113* 113* 112* 109  CO2 16*  --  22 24 24 26 27   GLUCOSE 150*   < > 128* 95 99 99 97  BUN 23*   < > 30* 21* 12 9 9   CREATININE 3.24*   < > 2.82* 2.34* 1.85* 1.72* 1.93*  CALCIUM 8.9  --  7.7* 7.4* 7.6* 8.1* 8.1*  MG 2.4  --   --   --   --   --   --    < > = values in this interval not displayed.   GFR: Estimated Creatinine Clearance: 53.2 mL/min (A) (by C-G formula based on SCr of 1.93 mg/dL (H)). Liver Function Tests: Recent Labs  Lab 11/07/17 1247 11/13/17 0613  AST 52* 70*  ALT 30 74*  ALKPHOS 35* 29*  BILITOT 3.5* 1.2  PROT 5.7* 4.5*  ALBUMIN 4.0 3.2*   Recent Labs  Lab 11/07/17 1247  LIPASE 31   Recent Labs  Lab 11/07/17 1248  AMMONIA 25   Coagulation Profile: Recent Labs  Lab 11/07/17 1247  INR 1.24   Cardiac Enzymes: Recent Labs  Lab 11/07/17 1247 11/07/17 1727 11/07/17 2305 11/08/17 0308 11/08/17 1026  CKTOTAL 869*  --   --   --  632*  TROPONINI 0.26* 0.43* 0.56* 0.77*  --    BNP (last 3 results) No results for input(s): PROBNP in the last 8760 hours. HbA1C: No results for input(s): HGBA1C in the last 72  hours. CBG: Recent Labs  Lab 11/07/17 1300  GLUCAP 148*   Lipid Profile: No results for input(s): CHOL, HDL, LDLCALC, TRIG, CHOLHDL, LDLDIRECT in the last 72 hours. Thyroid Function Tests: No results for input(s): TSH, T4TOTAL, FREET4, T3FREE, THYROIDAB in the last 72 hours. Anemia Panel: No results for input(s): VITAMINB12, FOLATE, FERRITIN, TIBC, IRON, RETICCTPCT in the last 72 hours. Sepsis Labs: Recent Labs  Lab 11/07/17 1727 11/07/17 2006 11/08/17 0308 11/08/17 1026 11/08/17 1841 11/09/17 0512  PROCALCITON 0.12  --  0.27  --   --  <0.10  LATICACIDVEN 4.5* 3.2*  --  2.9* 1.4  --     Recent Results (from the past 240 hour(s))  Culture, blood (routine x 2)     Status: None   Collection Time: 11/07/17  1:37 PM  Result Value Ref Range Status   Specimen Description BLOOD LEFT FOREARM  Final   Special Requests   Final    BOTTLES DRAWN AEROBIC AND ANAEROBIC Blood Culture results may not be optimal due to an inadequate volume of blood received in culture bottles   Culture   Final    NO GROWTH 5 DAYS Performed at [facility], [address] [place], [place] [address]    Report Status 11/12/2017 FINAL  Final  Culture, blood (routine x 2)     Status: None   Collection Time: 11/07/17  2:48 PM  Result Value Ref Range Status   Specimen Description BLOOD RIGHT HAND  Final   Special Requests   Final    BOTTLES DRAWN AEROBIC AND ANAEROBIC Blood Culture adequate volume   Culture   Final    NO GROWTH 5 DAYS Performed at [facility], [address] [place], [place] [address]    Report Status 11/12/2017 FINAL  Final  Urine culture     Status: None   Collection Time: 11/07/17  4:20 PM  Result Value Ref Range Status   Specimen Description URINE, CLEAN CATCH  Final   Special Requests Normal  Final   Culture   Final    NO GROWTH Performed at [facility], [address] [place], [place] [address]    Report Status 11/08/2017 FINAL  Final    Radiology  Studies: Mr Brain Wo Contrast  Result Date: 11/11/2017 CLINICAL DATA:  52 year old male with ataxia, altered mental status. EXAM: MRI HEAD WITHOUT CONTRAST TECHNIQUE: Multiplanar, multiecho pulse sequences of the brain and surrounding structures were obtained without intravenous contrast. COMPARISON:  Head CT without contrast 11/07/2017. FINDINGS: The examination had to be discontinued prior to completion due to patient refusal to continue. Axial and coronal diffusion weighted imaging only was obtained, but is of good diagnostic quality. No restricted diffusion to suggest acute infarction. No midline shift, mass effect, ventriculomegaly, extra-axial collection. Evidence of a small chronic lacune in the left cerebellum on series 12, image 13. IMPRESSION: 1. Negative for acute infarct. Small chronic lacune suspected in the left cerebellum. 2. The examination had to be discontinued prior to completion, only diffusion-weighted imaging was obtained. Electronically Signed   By: Odessa Fleming M.D.   On: 11/11/2017 21:21   Dg Chest Port 1 View  Result Date: 11/12/2017 CLINICAL DATA:  Shortness of breath since Tuesday. EXAM: PORTABLE CHEST 1 VIEW COMPARISON:  11/07/2017. FINDINGS: Trachea is midline. Cardiopericardial silhouette may appear enlarged due to AP apical lordotic technique. Lungs are clear. No pleural fluid. IMPRESSION: 1. Cardiopericardial silhouette may appear enlarged due to AP apical lordotic technique. Difficult to exclude a pericardial effusion. 2. Otherwise, no acute findings. Electronically Signed   By: Leanna Battles M.D.   On: 11/12/2017 08:44   Scheduled Meds: . sodium chloride   Intravenous Once  . amLODipine  5 mg Oral Daily  . ferrous sulfate  325 mg Oral Q breakfast  . folic acid  1 mg Oral Daily  . heparin  5,000 Units Subcutaneous Q8H  . polyethylene glycol  17 g Oral BID  . senna-docusate  1 tablet Oral BID  . thiamine  100 mg Oral Daily   Continuous Infusions: . sodium chloride 50  mL/hr at 11/12/17 0827    LOS: 6 days   Time spent: 25 min  Standley Dakins, MD [facility] Pager [phone] If 7PM-7AM, please contact night-coverage [email or web address] Password [facility] 11/13/2017, 3:19 PM

## 2017-11-13 NOTE — Progress Notes (Signed)
Benjamin RuizJohn is a 52 year old male pt admitted on voluntary basis. On admission, he reports that he has been feeling depressed and anxious and having panic attacks and having an increase in suicidal thoughts recently. He is able to contract for safety on the unit however. He spoke about how [facility] and spoke about how they had been adjusting it recently because he has been feeling more anxious and depressed recently. He spoke about how he feels that none of the medication changes were beneficial to him. He reports that he wants to get re-evaluated while here and started on some new medications. He reports that he lives with his girlfriend and reports that he will go back to the same place after discharge

## 2017-11-13 NOTE — Progress Notes (Signed)
D    Pt main concern expressed is that he has vicodin for pain and it is the same medication he was taking at home   Pt appears depressed and anxious    Verbal support given   Medications administered and effectiveness monitored   Q 15 min checks   Pt is presently safe

## 2017-11-13 NOTE — Tx Team (Signed)
Initial Treatment Plan 11/13/2017 7:07 PM Benjamin HaagensenJohn [name] ZOX:[id number]    PATIENT STRESSORS: Medication change or noncompliance Substance abuse   PATIENT STRENGTHS: Ability for insight Average or above average intelligence Capable of independent living General fund of knowledge Motivation for treatment/growth Supportive family/friends   PATIENT IDENTIFIED PROBLEMS: Depression Substance Abuse Suicidal thoughts "I need to be re-evaluated and get some new medication"                     DISCHARGE CRITERIA:  Ability to meet basic life and health needs Improved stabilization in mood, thinking, and/or behavior Reduction of life-threatening or endangering symptoms to within safe limits Verbal commitment to aftercare and medication compliance  PRELIMINARY DISCHARGE PLAN: Attend aftercare/continuing care group Return to previous living arrangement  PATIENT/FAMILY INVOLVEMENT: This treatment plan has been presented to and reviewed with the patient, Benjamin HaagensenJohn [name], and/or family member, .  The patient and family have been given the opportunity to ask questions and make suggestions.  Benjamin [name], [name], [place] 11/13/2017, 7:07 PM

## 2017-11-14 DIAGNOSIS — F1410 Cocaine abuse, uncomplicated: Secondary | ICD-10-CM

## 2017-11-14 DIAGNOSIS — F1120 Opioid dependence, uncomplicated: Secondary | ICD-10-CM

## 2017-11-14 DIAGNOSIS — F332 Major depressive disorder, recurrent severe without psychotic features: Principal | ICD-10-CM

## 2017-11-14 DIAGNOSIS — F1210 Cannabis abuse, uncomplicated: Secondary | ICD-10-CM

## 2017-11-14 DIAGNOSIS — F1920 Other psychoactive substance dependence, uncomplicated: Secondary | ICD-10-CM

## 2017-11-14 DIAGNOSIS — F17210 Nicotine dependence, cigarettes, uncomplicated: Secondary | ICD-10-CM

## 2017-11-14 MED ORDER — TRAZODONE HCL 50 MG PO TABS: 50.0000 mg | ORAL_TABLET | Freq: Every evening | ORAL | Status: DC | PRN

## 2017-11-14 MED ORDER — CLONAZEPAM 0.5 MG PO TABS
0.2500 mg | ORAL_TABLET | Freq: Two times a day (BID) | ORAL | Status: DC | PRN
  Administered 2017-11-14: 0.25 mg via ORAL
  Filled 2017-11-14 (×2): qty 1

## 2017-11-14 MED ORDER — HYDROXYZINE HCL 50 MG PO TABS
50.0000 mg | ORAL_TABLET | Freq: Four times a day (QID) | ORAL | Status: DC | PRN
  Administered 2017-11-14 – 2017-11-17 (×2): 50 mg via ORAL
  Filled 2017-11-14: qty 1

## 2017-11-14 MED ORDER — ARIPIPRAZOLE 10 MG PO TABS
10.0000 mg | ORAL_TABLET | Freq: Every day | ORAL | Status: DC
  Administered 2017-11-14 – 2017-11-17 (×4): 10 mg via ORAL
  Filled 2017-11-14 (×7): qty 1

## 2017-11-14 MED ORDER — ZOLPIDEM TARTRATE 5 MG PO TABS
5.0000 mg | ORAL_TABLET | Freq: Every evening | ORAL | Status: DC | PRN
  Administered 2017-11-15 – 2017-11-16 (×2): 5 mg via ORAL
  Filled 2017-11-14 (×2): qty 1

## 2017-11-14 NOTE — Progress Notes (Signed)
Patient attended NA group meeting & participated.

## 2017-11-14 NOTE — Progress Notes (Signed)
Pt reports his day has been good.  He denies SI/HI/AVH at this time.  He denies any withdrawal symptoms this evening.  Pt was concerned that some med changes were made that he says he was not aware of.  Writer explained what changes were made and did med education with patient.  Pt was also given his Norco for his chronic back pain.  Pt was cooperative and appropriate with staff. He makes his needs known to staff.  Support and encouragement offered.  Discharge plans are in process.  Safety maintained with q15 minute checks.

## 2017-11-14 NOTE — H&P (Addendum)
Psychiatric Admission Assessment Adult  Patient Identification: Benjamin [name]  MRN:  [id number]  Date of Evaluation:  11/14/2017  Chief Complaint: Suicidal ideations with plans to cut his wrist & increased drug/alcohol use.  Principal Diagnosis: Major depressive disorder, recurrent episode, severe ([place])  Diagnosis:   Patient Active Problem List   Diagnosis Date Noted  . Major depressive disorder, recurrent episode, severe ([place]) [F33.2] 08/24/2015    Priority: High  . Routine general medical examination at a health care facility [Z00.00] 05/24/2015    Priority: Medium  . Polysubstance dependence including opioid drug with daily use ([place]) [F19.20] 11/14/2017  . Suicidal ideation [R45.851] 11/13/2017  . Anxiety and depression [F41.9, F32.9]   . Acute renal failure ([place]) [N17.9]   . Altered mental status [R41.82]   . Cocaine use disorder ([place]) [F14.10]   . AKI (acute kidney injury) ([place]) [N17.9] 11/07/2017  . Leukocytosis [D72.829] 11/07/2017  . Chronic kidney disease (CKD), stage III (moderate) ([place]) [N18.3] 09/19/2016  . Elevated LFTs [R94.5] 09/19/2016  . Insomnia [G47.00]   . MDD (major depressive disorder), single episode, severe , no psychosis ([place]) [F32.2] 08/14/2015  . Cannabis use disorder, mild, abuse [F12.10] 08/14/2015  . Cocaine abuse ([place]) [F14.10] 08/10/2015  . Weight loss, non-intentional [R63.4] 08/10/2015  . Depression [F32.9] 08/10/2015  . Essential hypertension [I10]   . Vaccine refused by patient [Z28.20] 05/24/2015  . Chronic back pain [M54.9, G89.29] 05/24/2015  . Family history of cancer [Z80.9] 05/24/2015  . Screening for prostate cancer [Z12.5] 05/24/2015  . Smoker [F17.200] 05/24/2015  . Screen for colon cancer [Z12.11] 05/24/2015  . Erectile dysfunction [N52.9] 01/31/2014   History of Present Illness: This is an admission assessment for this 52 year old AA male with hx of mental illness & polysubstance use disorder. He is known on this unit from  previous hospitalizations, twice in 2017. He is being admitted to the [facility] this time around from the [facility] hospital with complaints of worsening depression, anxiety, panic attacks & increased drug/alcohol use. He was also complaining of suicidal ideations with plans to cut himself. Chart review indicated hx of previous suicide attempts. He was apparently using heroin & & Cocaine. His lab results are out of wack. His liver enzymes are elevated. Patient reports hx of body-building of which he used a lot of creatine to enhance his muscle growth. He says this drug did a lot of damage to his kidneys & liver.   During this assessment, Benjamin [name] reports, "The ambulance took me to the [facility] ED last week. I think it was last Wednesday. A lot is going on in my life. As a result, to get some relief, I have been drugging & drinking a lot of alcohol. On that particular Wednesday last week, I wanted to die & I still wished I was dead. I was found in a hotel bathroom passes out during a check-out time & the hotel clark called the ambulance. I was doing so much drugs & drinking so much that I did not known when or how I passed out in the bathroom. I think I was withdrawing from all the drugs in me. I used & drank off & on x 4 weeks. I did a lot of drugs 2 days prior to the passing out event. I had stopped taking all my mental health medications (Trintelix & 2 other ones). I was on 3 separate psych medicines. I have been on Trintelix x 2 years. Dealing with life & all my medical issues  has been overwhelming & challenging. I also was dealing with paranoia about losing my job. I need my medications adjusted. My primary psychiatrist has made so many changes to my medicines but, they were not helping. I still would like to stay on the Trintelix. My depression is at #5 & anxiety #8.    Associated Signs/Symptoms:  Depression Symptoms:  depressed mood, insomnia, feelings of worthlessness/guilt, hopelessness, suicidal  thoughts with specific plan, anxiety, weight loss,  (Hypo) Manic Symptoms:  Denies any hypomanic symptoms  Anxiety Symptoms:  Excessive Worry, Panic Symptoms,  Psychotic Symptoms:  Denies any hallucinations, delusions or paranoia  PTSD Symptoms: Re-experiencing:  "Intrusive thoughts about my mother's death"  Total Time spent with patient: 1 hour  Is the patient at risk to self? Yes.    Has the patient been a risk to self in the past 6 months? Yes.    Has the patient been a risk to self within the distant past? No.  Is the patient a risk to others? No.  Has the patient been a risk to others in the past 6 months? No.  Has the patient been a risk to others within the distant past? No.   Alcohol Screening: 1. How often do you have a drink containing alcohol?: 2 to 3 times a week 2. How many drinks containing alcohol do you have on a typical day when you are drinking?: 3 or 4 3. How often do you have six or more drinks on one occasion?: Weekly AUDIT-C Score: 7 4. How often during the last year have you found that you were not able to stop drinking once you had started?: Never 5. How often during the last year have you failed to do what was normally expected from you becasue of drinking?: Never 6. How often during the last year have you needed a first drink in the morning to get yourself going after a heavy drinking session?: Never 7. How often during the last year have you had a feeling of guilt of remorse after drinking?: Never 8. How often during the last year have you been unable to remember what happened the night before because you had been drinking?: Less than monthly 9. Have you or someone else been injured as a result of your drinking?: No 10. Has a relative or friend or a doctor or another health worker been concerned about your drinking or suggested you cut down?: No Alcohol Use Disorder Identification Test Final Score (AUDIT): 8 Intervention/Follow-up: Alcohol  Education  Substance Abuse History in the last 12 months:  Yes.    Consequences of Substance Abuse: Medical Consequences:  Liver damage, Possible death by overdose Legal Consequences:  Arrests, jail time, Loss of driving privilege. Family Consequences:  Family discord, divorce and or separation.  Past Medical History:  Past Medical History:  Diagnosis Date  . Chronic back pain    managed [facility]  . CKD (chronic kidney disease), stage III ([place]) 10/2017  . Erectile dysfunction   . History of substance abuse   . Hx of suicide attempt   . Hypertension     Past Surgical History:  Procedure Laterality Date  . ANKLE SURGERY     right  . APPLICATION OF WOUND VAC Right 08/17/2015   Procedure: APPLICATION OF WOUND VAC;  Surgeon: Roseanne Kaufman, MD;  Location: [place];  Service: Orthopedics;  Laterality: Right;  . HAND SURGERY     fracture, ORIG, teenage years, right.  . I&D EXTREMITY Right  08/10/2015   Procedure: IRRIGATION AND DEBRIDEMENT SKIN, SUBCUTANEOS TISSUE AND  MUSCLE, CARPAL TUNNEL RELEASE, MEDIAN NERVE LYSIS WITH NERVE WRAP;  Surgeon: Roseanne Kaufman, MD;  Location: [place];  Service: Orthopedics;  Laterality: Right;  . INCISION AND DRAINAGE OF WOUND Right 08/15/2015   Procedure: IRRIGATION AND DEBRIDEMENT WOUND;  Surgeon: Justice Britain, MD;  Location: [facility];  Service: Orthopedics;  Laterality: Right;  . LUMBAR EPIDURAL INJECTION     [facility]  . SKIN SPLIT GRAFT Right 08/17/2015   Procedure: I&D RIGHT FOREARM/POSSIBLE SKIN GRAFT;  Surgeon: Roseanne Kaufman, MD;  Location: [place];  Service: Orthopedics;  Laterality: Right;  . TENDON REPAIR N/A 07/24/2015   Procedure: WRIST LACERATION AND TENDON REPAIR;  Surgeon: Iran Planas, MD;  Location: [place];  Service: Orthopedics;  Laterality: N/A;  . WOUND EXPLORATION Right 08/15/2015   Procedure: WOUND EXPLORATION right forearm;  Surgeon: Justice Britain, MD;  Location: [facility];  Service: Orthopedics;  Laterality:  Right;    Family Psychiatric history:   Family Medical History: Lung cancer: Mother.                                                                       Esophageal cancer: Father Family History  Problem Relation Age of Onset  . Cancer Mother        lung  . Anxiety disorder Mother   . Cancer Father        throat  . Diabetes Paternal Grandmother   . Cancer Paternal Aunt   . Heart disease Neg Hx   . Stroke Neg Hx    Tobacco Screening: Smokes 1/2 pack cigarettes daily.   Social History: Single, no children. Employed, lives with girlfriend. Social History   Substance and Sexual Activity  Alcohol Use Yes  . Alcohol/week: 0.0 standard drinks   Comment: occasional     Social History   Substance and Sexual Activity  Drug Use Yes  . Types: Marijuana, Cocaine, Heroin   Comment: last use cocaine 08/08/15, last use MJ 08/08/15    Additional Social History: Polysubstance user Allergies:  No Known Allergies  Lab Results:  Results for orders placed or performed during the hospital encounter of 11/07/17 (from the past 48 hour(s))  Hepatic function panel     Status: Abnormal   Collection Time: 11/13/17  6:13 AM  Result Value Ref Range   Total Protein 4.5 (L) 6.5 - 8.1 g/dL   Albumin 3.2 (L) 3.5 - 5.0 g/dL   AST 70 (H) 15 - 41 U/L   ALT 74 (H) 0 - 44 U/L   Alkaline Phosphatase 29 (L) 38 - 126 U/L   Total Bilirubin 1.2 0.3 - 1.2 mg/dL   Bilirubin, Direct 0.1 0.0 - 0.2 mg/dL   Indirect Bilirubin 1.1 (H) 0.3 - 0.9 mg/dL    Comment: Performed at [facility], [address] [place] [address]  CBC     Status: Abnormal   Collection Time: 11/13/17  6:33 AM  Result Value Ref Range   WBC 6.0 4.0 - 10.5 K/uL   RBC 3.40 (L) 4.22 - 5.81 MIL/uL   Hemoglobin 8.8 (L) 13.0 - 17.0 g/dL   HCT 28.5 (L) 39.0 - 52.0 %  MCV 83.8 78.0 - 100.0 fL   MCH 25.9 (L) 26.0 - 34.0 pg   MCHC 30.9 30.0 - 36.0 g/dL   RDW 17.3 (H) 11.5 - 15.5 %   Platelets 154 150 - 400 K/uL    Comment:  Performed at [facility] [address] [place], [place] [address]  Basic metabolic panel     Status: Abnormal   Collection Time: 11/13/17  6:33 AM  Result Value Ref Range   Sodium 140 135 - 145 mmol/L   Potassium 3.7 3.5 - 5.1 mmol/L   Chloride 109 98 - 111 mmol/L   CO2 27 22 - 32 mmol/L   Glucose, Bld 97 70 - 99 mg/dL   BUN 9 6 - 20 mg/dL   Creatinine, Ser 1.93 (H) 0.61 - 1.24 mg/dL   Calcium 8.1 (L) 8.9 - 10.3 mg/dL   GFR calc non Af Amer 39 (L) >60 mL/min   GFR calc Af Amer 45 (L) >60 mL/min    Comment: (NOTE) The eGFR has been calculated using the CKD EPI equation. This calculation has not been validated in all clinical situations. eGFR's persistently <60 mL/min signify possible Chronic Kidney Disease.    Anion gap 4 (L) 5 - 15    Comment: Performed at [facility] [address], [place], [place] [address]   Blood Alcohol level:  Lab Results  Component Value Date   [facility] <10 11/07/2017   ETH <5 79/04/4095   Metabolic Disorder Labs:  Lab Results  Component Value Date   HGBA1C 5.6 08/31/2016   MPG 114 08/31/2016   MPG 120 (H) 05/24/2015   No results found for: PROLACTIN Lab Results  Component Value Date   CHOL 254 (H) 10/08/2017   TRIG 183 (H) 10/08/2017   HDL 58 10/08/2017   CHOLHDL 4.4 10/08/2017   VLDL 33 (H) 08/31/2016   LDLCALC 159 (H) 10/08/2017   LDLCALC 129 (H) 08/31/2016   Current Medications: Current Facility-Administered Medications  Medication Dose Route Frequency Provider Last Rate Last Dose  . amLODipine (NORVASC) tablet 5 mg  5 mg Oral Daily Rankin, Shuvon B, NP   5 mg at 11/14/17 0837  . clonazePAM (KLONOPIN) tablet 0.25 mg  0.25 mg Oral BID PRN Cobos, Myer Peer, MD   0.25 mg at 11/14/17 1006  . ferrous sulfate tablet 325 mg  325 mg Oral Q breakfast Rankin, Shuvon B, NP   325 mg at 11/14/17 0837  . folic acid (FOLVITE) tablet 1 mg  1 mg Oral Daily Rankin, Shuvon B, NP   1 mg at 11/14/17 0837  . heparin injection 5,000 Units   5,000 Units Subcutaneous Q8H Rankin, Shuvon B, NP      . HYDROcodone-acetaminophen (NORCO) 10-325 MG per tablet 1 tablet  1 tablet Oral Q4H PRN Rankin, Shuvon B, NP   1 tablet at 11/14/17 0840  . loratadine (CLARITIN) tablet 10 mg  10 mg Oral Daily PRN Rankin, Shuvon B, NP      . naphazoline-pheniramine (NAPHCON-A) 0.025-0.3 % ophthalmic solution 1 drop  1 drop Both Eyes QID PRN Rankin, Shuvon B, NP      . nicotine (NICODERM CQ - dosed in mg/24 hours) patch 21 mg  21 mg Transdermal Daily PRN Rankin, Shuvon B, NP      . polyethylene glycol (MIRALAX / GLYCOLAX) packet 17 g  17 g Oral BID Rankin, Shuvon B, NP      . senna-docusate (Senokot-S) tablet 1 tablet  1 tablet Oral  BID Rankin, Shuvon B, NP      . thiamine (VITAMIN B-1) tablet 100 mg  100 mg Oral Daily Rankin, Shuvon B, NP   100 mg at 11/14/17 7425   PTA Medications: Medications Prior to Admission  Medication Sig Dispense Refill Last Dose  . amLODipine (NORVASC) 10 MG tablet TAKE 1 TABLET(10 MG) BY MOUTH DAILY 90 tablet 3 11/02/2017  . ferrous sulfate 325 (65 FE) MG tablet Take 1 tablet (325 mg total) by mouth daily with breakfast. 30 tablet 0   . FLUoxetine (PROZAC) 40 MG capsule Take 40 mg by mouth daily.  0 11/29/6385  . folic acid (FOLVITE) 1 MG tablet Take 1 tablet (1 mg total) by mouth daily.     Marland Kitchen HYDROcodone-acetaminophen (NORCO) 10-325 MG tablet Take 1 tablet by mouth every 6 (six) hours as needed for moderate pain.    Past Week at Unknown time  . polyethylene glycol (MIRALAX / GLYCOLAX) packet Take 17 g by mouth 2 (two) times daily. 60 packet 0   . thiamine 100 MG tablet Take 1 tablet (100 mg total) by mouth daily.      Musculoskeletal: Strength & Muscle Tone: within normal limits Gait & Station: normal Patient leans: N/A  Psychiatric Specialty Exam: Physical Exam  Nursing note and vitals reviewed. Constitutional: He is oriented to person, place, and time. He appears well-developed.  HENT:  Head: Normocephalic.  Eyes: Pupils  are equal, round, and reactive to light.  Neck: Normal range of motion.  Cardiovascular:  Hx. HTN  Respiratory: Effort normal. No respiratory distress.  GI: Soft.  Genitourinary:  Genitourinary Comments: Deferred  Musculoskeletal: Normal range of motion.  Neurological: He is alert and oriented to person, place, and time.  Skin: Skin is warm and dry.    Review of Systems  Constitutional: Negative for malaise/fatigue.  HENT: Negative.   Eyes: Negative.   Respiratory: Negative.  Negative for cough and shortness of breath.   Cardiovascular: Negative.  Negative for chest pain and palpitations.       HX. HTN  Gastrointestinal: Negative.   Genitourinary: Negative.   Musculoskeletal: Positive for myalgias.  Skin: Negative.   Neurological: Negative.   Psychiatric/Behavioral: Positive for depression and substance abuse (UDS positive for Cocaine, on a routine prn opioid). Negative for hallucinations and memory loss. The patient is nervous/anxious and has insomnia.     Blood pressure (!) 148/81, pulse (!) 49, temperature 97.7 F (36.5 C), temperature source Oral, resp. rate 18, height 6' 2"  (1.88 m), weight 99.8 kg.Body mass index is 28.25 kg/m.  General Appearance: Casual and Fairly Groomed  Eye Contact:  Good  Speech:  Clear and Coherent and Normal Rate  Volume:  Normal  Mood:  Anxious and Depressed  Affect:  Constricted and Depressed  Thought Process:  Coherent, Goal Directed and Descriptions of Associations: Intact  Orientation:  Full (Time, Place, and Person)  Thought Content:  Rumination  Suicidal Thoughts:  Yes.  without intent/plan  Homicidal Thoughts:  Denies any thoughts, plans or intent  Memory:  Immediate;   Good Recent;   Good Remote;   Fair  Judgement:  Intact  Insight:  Lacking  Psychomotor Activity:  Normal  Concentration:  Concentration: Good and Attention Span: Good  Recall:  Good  Fund of Knowledge:  Good  Language:  Good  Akathisia:  Negative  Handed:  Right   AIMS (if indicated):     Assets:  Communication Skills Desire for Improvement  ADL's:  Intact  Cognition:  WNL  Sleep:  Fair   Treatment Plan/Recommendations: 1. Admit for crisis management and stabilization, estimated length of stay 3-5 days.   2. Medication management to reduce current symptoms to base line and improve the patient's overall level of functioning: See MAR, Md's SRA & treatment plan.    Observation Level/Precautions:  15 minute checks  Laboratory:  Per ED, UDS (+) for Cocaine  Psychotherapy: Group therapy sessions.  Medications: See [facility]   Consultations: As needed.   Discharge Concerns: Safety, mood stability    Estimated LOS: 2-4 days  Other: Admit to the [facility].    Physician Treatment Plan for Primary Diagnosis: <principal problem not specified>  Long Term Goal(s): Improvement in symptoms so as ready for discharge  Short Term Goals: Ability to identify changes in lifestyle to reduce recurrence of condition will improve and Ability to demonstrate self-control will improve  Physician Treatment Plan for Secondary Diagnosis: Active Problems:   MDD (major depressive disorder), recurrent episode, severe ([place])  Long Term Goal(s): Improvement in symptoms so as ready for discharge  Short Term Goals: Ability to identify and develop effective coping behaviors will improve, Compliance with prescribed medications will improve and Ability to identify triggers associated with substance abuse/mental health issues will improve  I certify that inpatient services furnished can reasonably be expected to improve the patient's condition.    Lindell Spar, NP, PMHNP, FNP-BC 8/21/201911:50 AM   I have reviewed NP's Note, assessement, diagnosis and plan, and agree. I have also met with patient and completed suicide risk assessment.  Benjamin [name] is a 52 y/o M wit history of treatment for depression, anxiety, and polysubstance abuse who was admitted voluntarily from [facility] with  worsening depression, anxiety, SI with plan to cut himself, and worsening relapse of multiple illicit substances including alcohol, cocaine, and heroin. Pt was medically cleared and then transferred to [facility] for additional treatment and stabilization.  Upon initial interview, pt shares, "I wasn't feeling my usual self for a couple of weeks. I told my therapist and doctor, and they tried changing a couple of things. They took me off the trintellix and I ended up on like three different psych meds at the same time, and I felt confused. I was not taking things the right way, and things were not in order. I had anxiety and panic attacks, and then I got a bottle of liquor, cocaine, and some heroin, and I started binging. I was in a hotel room and if it wasn't for the check-out time, I'd still be there." Pt reports that his intention was to complete suicide by using multiple illicit substances. He denies HI/AH/VH. He endorses depression symptoms of depressed mood, low energy, poor concentration, poor appetite, low motivation, and hopelessness. He denies symptoms of mania, OCD, and PTSD. He reports use of alcohol, cocaine, and heroin over the past week, and he denies other illicit substance use.  Discussed with patient about potential treatment options. As per chart review, pt was about to change from trintellix to abilify, and he cannot recall previous trial of abilify. He agrees to simplify his psychotropic medication regimen to just abilify. He will speak with SW team about substance use treatment options.  PLAN OF CARE:   -Admit to inpatient level of care  -MDD, recurrent, severe, without psychosis             -Start abilify 78m po qDay  -Anxiety             -DC klonopin             -  Start vistaril 56m po q6h prn anxiety  -HTN              -Continue norvasc 531mpo qDay  -microcytic anemia             -Continue ferrous sulfate 32511mo qAM  -seasonal allergy             -Continue claritin  45m30m qDay             -Continue Naphcon-A 1 drop both eyes QID prn eye irritation    -Chronic pain             -Continue Norco 10-325mg34mablet q4h prn severe pain (resuming home regimen)  -insomnia             -Continue ambien 5mg p26mhs prn insomnia  -Encourage participation in groups and therapeutic milieu  -disposition planning will be ongoing  ChristMaris Berger

## 2017-11-14 NOTE — Progress Notes (Signed)
D:  Patient's self inventory sheet, patient has poor sleep, sleep medication not helpful.  Fair appetite, low energy level, good concentration.  Rated depression and hopeless 15, anxiety 8.  Denied withdrawals.  SI, sometimes, no plan, contracts for safety.  Physical problems, back pain, worst pain in past 24 hours #8.  Pain medication helpful.  Goal is feel better.  Unsure of plans.  No discharge plans. A:  Medications administered per MD orders.  Emotional support and encouragement given patient. R:  Denied SI and HI wile talking to nurse this morning, contracts for safety.  Denied A/V hallucinations.  Safety maintained with 15 minute checks.

## 2017-11-14 NOTE — Progress Notes (Signed)
Recreation Therapy Notes  Date: 9.21.19 Time: 0930 Location: [facility] [address] Dayroom  Group Topic: Stress Management  Goal Area(s) Addresses:  Patient will verbalize importance of using healthy stress management.  Patient will identify positive emotions associated with healthy stress management.   Intervention: Stress Management  Activity : Guided Imagery.  LRT introduced the stress management technique of guided imagery.  LRT read a script that allowed patients to envision being on the beach.  Patients were to follow along as LRT read script.  Education:  Stress Management, Discharge Planning.   Education Outcome: Acknowledges edcuation/In group clarification offered/Needs additional education  Clinical Observations/Feedback: Pt did not attend group.    Caroll RancherMarjette Trennon Torbeck, LRT/CTRS         Lillia AbedLindsay, Shinichi Anguiano A 11/14/2017 10:55 AM
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
## 2017-11-14 NOTE — Plan of Care (Signed)
Nurse discussed depression, anxiety, coping skills with patient.  

## 2017-11-14 NOTE — [facility] Group Notes (Signed)
LCSW Group Therapy Note  11/14/2017 1:15pm  Type of Therapy/Topic:  Group Therapy:  Balance in Life  Participation Level:  Active  Description of Group:    This group will address the concept of balance and how it feels and looks when one is unbalanced. Patients will be encouraged to process areas in their lives that are out of balance and identify reasons for remaining unbalanced. Facilitators will guide patients in utilizing problem-solving interventions to address and correct the stressor making their life unbalanced. Understanding and applying boundaries will be explored and addressed for obtaining and maintaining a balanced life. Patients will be encouraged to explore ways to assertively make their unbalanced needs known to significant others in their lives, using other group members and facilitator for support and feedback.  Therapeutic Goals: 1. Patient will identify two or more emotions or situations they have that consume much of in their lives. 2. Patient will identify signs/triggers that life has become out of balance:  3. Patient will identify two ways to set boundaries in order to achieve balance in their lives:  4. Patient will demonstrate ability to communicate their needs through discussion and/or role plays  Summary of Patient Progress:  Jonny RuizJohn was attentive and engaged. He shared that his medication adjustments over the past month and heroin/cocaine abuse have thrown his mental health out of balance. Jonny RuizJohn is interested in either attending an inpatient residential facility for treatment or outpatient treatment for both depression/anxiety/ptsd issues and substance abuse issues. He identified his girlfriend as his primary support. Bram continues to make progress in the group setting with improving insight.   Therapeutic Modalities:   Cognitive Behavioral Therapy Solution-Focused Therapy Assertiveness Training  Rona RavensHeather S Khalie Wince, [place] 11/14/2017 3:58 PM

## 2017-11-14 NOTE — Therapy (Signed)
Occupational Therapy Group Note  Date:  11/14/2017 Time:  12:16 PM  Group Topic/Focus:  Self Esteem  Participation Level:  Active  Participation Quality:  Appropriate  Affect:  Flat  Cognitive:  Appropriate  Insight: Improving  Engagement in Group:  Engaged  Modes of Intervention:  Activity, Discussion, Education and Socialization  Additional Comments:    S: "Realtionships have affected my self esteem"  O: OT tx with focus on self esteem building this date. Education given on definition of self esteem, with both causes of low and high self esteem identified. Activity given for pt to identify a positive/aspiring trait for each letter of the alphabet. Pt to work with peers to help complete activity and build positive thinking.   A: Pt presents to group with flat affect, engaged and participatory throughout session. Pt shares death/loss and relationships have affected his self esteem, and feels he is currently at a 5/10 (10 being the highest rating of self esteem). A-z activity completed with min-mod VC's. Pt appropriately brainstorming with other peers for support. Noted improved affect after completion of activity, few smiles and laughter with other peers. Pt willing to share favorite words with peers once completed.  P: Education given on self esteem and how to improve this date. Handouts and activities given to help facilitate skills when reintegrating into community.       Benjamin HandingKaylee Huzaifa [name], MSOT, OTR/L   [place] Hope[place]lee Vera [name] 11/14/2017, 12:16 PM
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
## 2017-11-14 NOTE — Tx Team (Addendum)
Interdisciplinary Treatment and Diagnostic Plan Update  11/14/2017 Time of Session: 0830AM Benjamin [name] MRN: [id number]  Principal Diagnosis: MDD, recurrent, severe  Secondary Diagnoses: Active Problems:   MDD (major depressive disorder), recurrent episode, severe (HCC)   Current Medications:  Current Facility-Administered Medications  Medication Dose Route Frequency Provider Last Rate Last Dose  . amLODipine (NORVASC) tablet 5 mg  5 mg Oral Daily Rankin, Shuvon B, NP   5 mg at 11/14/17 0837  . clonazePAM (KLONOPIN) disintegrating tablet 0.25 mg  0.25 mg Oral BID PRN Rankin, Shuvon B, NP      . ferrous sulfate tablet 325 mg  325 mg Oral Q breakfast Rankin, Shuvon B, NP   325 mg at 11/14/17 0837  . folic acid (FOLVITE) tablet 1 mg  1 mg Oral Daily Rankin, Shuvon B, NP   1 mg at 11/14/17 0837  . heparin injection 5,000 Units  5,000 Units Subcutaneous Q8H Rankin, Shuvon B, NP      . HYDROcodone-acetaminophen (NORCO) 10-325 MG per tablet 1 tablet  1 tablet Oral Q4H PRN Rankin, Shuvon B, NP   1 tablet at 11/14/17 0840  . loratadine (CLARITIN) tablet 10 mg  10 mg Oral Daily PRN Rankin, Shuvon B, NP      . naphazoline-pheniramine (NAPHCON-A) 0.025-0.3 % ophthalmic solution 1 drop  1 drop Both Eyes QID PRN Rankin, Shuvon B, NP      . nicotine (NICODERM CQ - dosed in mg/24 hours) patch 21 mg  21 mg Transdermal Daily PRN Rankin, Shuvon B, NP      . polyethylene glycol (MIRALAX / GLYCOLAX) packet 17 g  17 g Oral BID Rankin, Shuvon B, NP      . senna-docusate (Senokot-S) tablet 1 tablet  1 tablet Oral BID Rankin, Shuvon B, NP      . thiamine (VITAMIN B-1) tablet 100 mg  100 mg Oral Daily Rankin, Shuvon B, NP   100 mg at 11/14/17 4585   PTA Medications: Medications Prior to Admission  Medication Sig Dispense Refill Last Dose  . amLODipine (NORVASC) 10 MG tablet TAKE 1 TABLET(10 MG) BY MOUTH DAILY 90 tablet 3 11/02/2017  . ferrous sulfate 325 (65 FE) MG tablet Take 1 tablet (325 mg total) by mouth  daily with breakfast. 30 tablet 0   . FLUoxetine (PROZAC) 40 MG capsule Take 40 mg by mouth daily.  0 11/27/9242  . folic acid (FOLVITE) 1 MG tablet Take 1 tablet (1 mg total) by mouth daily.     Marland Kitchen HYDROcodone-acetaminophen (NORCO) 10-325 MG tablet Take 1 tablet by mouth every 6 (six) hours as needed for moderate pain.    Past Week at Unknown time  . polyethylene glycol (MIRALAX / GLYCOLAX) packet Take 17 g by mouth 2 (two) times daily. 60 packet 0   . thiamine 100 MG tablet Take 1 tablet (100 mg total) by mouth daily.       Patient Stressors: Medication change or noncompliance Substance abuse  Patient Strengths: Ability for insight Average or above average intelligence Capable of independent living General fund of knowledge Motivation for treatment/growth Supportive family/friends  Treatment Modalities: Medication Management, Group therapy, Case management,  1 to 1 session with clinician, Psychoeducation, Recreational therapy.   Physician Treatment Plan for Primary Diagnosis: MDD, recurrent, severe  Medication Management: Evaluate patient's response, side effects, and tolerance of medication regimen.  Therapeutic Interventions: 1 to 1 sessions, Unit Group sessions and Medication administration.  Evaluation of Outcomes: Not Met  Physician Treatment Plan for Secondary Diagnosis:  Active Problems:   MDD (major depressive disorder), recurrent episode, severe (HCC)   Medication Management: Evaluate patient's response, side effects, and tolerance of medication regimen.  Therapeutic Interventions: 1 to 1 sessions, Unit Group sessions and Medication administration.  Evaluation of Outcomes: Not Met   RN Treatment Plan for Primary Diagnosis: MDD, recurrent, severe Long Term Goal(s): Knowledge of disease and therapeutic regimen to maintain health will improve  Short Term Goals: Ability to remain free from injury will improve, Ability to disclose and discuss suicidal ideas and Ability  to identify and develop effective coping behaviors will improve  Medication Management: RN will administer medications as ordered by provider, will assess and evaluate patient's response and provide education to patient for prescribed medication. RN will report any adverse and/or side effects to prescribing provider.  Therapeutic Interventions: 1 on 1 counseling sessions, Psychoeducation, Medication administration, Evaluate responses to treatment, Monitor vital signs and CBGs as ordered, Perform/monitor CIWA, COWS, AIMS and Fall Risk screenings as ordered, Perform wound care treatments as ordered.  Evaluation of Outcomes: Not Met   LCSW Treatment Plan for Primary Diagnosis: MDD, recurrent, severe Long Term Goal(s): Safe transition to appropriate next level of care at discharge, Engage patient in therapeutic group addressing interpersonal concerns.  Short Term Goals: Engage patient in aftercare planning with referrals and resources, Facilitate patient progression through stages of change regarding substance use diagnoses and concerns and Identify triggers associated with mental health/substance abuse issues  Therapeutic Interventions: Assess for all discharge needs, 1 to 1 time with Social worker, Explore available resources and support systems, Assess for adequacy in community support network, Educate family and significant other(s) on suicide prevention, Complete Psychosocial Assessment, Interpersonal group therapy.  Evaluation of Outcomes: Not Met   Progress in Treatment: Attending groups: No. New to unit. Continuing to assess.  Participating in groups: No. Taking medication as prescribed: Yes. Toleration medication: Yes. Family/Significant other contact made: No, will contact:  family member if pt consents to collateral contact.  Patient understands diagnosis: Yes. Discussing patient identified problems/goals with staff: Yes. Medical problems stabilized or resolved: Yes. Denies  suicidal/homicidal ideation: Yes. Issues/concerns per patient self-inventory: No. Other: n/a  New problem(s) identified: No, Describe:  n/a  New Short Term/Long Term Goal(s): detox, medication management for mood stabilization; elimination of SI thoughts; development of comprehensive mental wellness/sobriety plan.   Patient Goals:  "I need my medications adjusted."   Discharge Plan or Barriers: CSW assessing. Pt plans to return home with his girlfriend and follow-up at [place]. [place] pamphlet, Mobile Crisis information, and AA/NA information provided to patient for additional community support and resources.   Reason for Continuation of Hospitalization: Anxiety Depression Medication stabilization Withdrawal symptoms  Estimated Length of Stay: Monday, 11/19/17  Attendees: Patient: 11/14/2017 8:45 AM  Physician: Dr. Parke Poisson MD; Dr. Nancy Fetter MD 11/14/2017 8:45 AM  Nursing: Demaris Callander; [place] RN 11/14/2017 8:45 AM  RN Care Manager:x 11/14/2017 8:45 AM  Social Worker: Janice Norrie LCSW 11/14/2017 8:45 AM  Recreational Therapist: x 11/14/2017 8:45 AM  Other: Lindell Spar NP; Benjamine Mola NP 11/14/2017 8:45 AM  Other:  11/14/2017 8:45 AM  Other: 11/14/2017 8:45 AM    Scribe for Treatment Team: Avelina Laine, LCSW 11/14/2017 8:45 AM

## 2017-11-14 NOTE — [facility] Suicide Risk Assessment (Signed)
[facility] INPATIENT:  Family/Significant Other Suicide Prevention Education  Suicide Prevention Education:  Education Completed; Benjamin [name] (pt's girlfriend) [phone] has been identified by the patient as the family member/significant other with whom the patient will be residing, and identified as the person(s) who will aid the patient in the event of a mental health crisis (suicidal ideations/suicide attempt).  With written consent from the patient, the family member/significant other has been provided the following suicide prevention education, prior to the and/or following the discharge of the patient.  The suicide prevention education provided includes the following:  Suicide risk factors  Suicide prevention and interventions  National Suicide Hotline telephone number  [facility] assessment telephone number  [facility] Emergency Assistance 911  [facility] and/or Residential Mobile Crisis Unit telephone number  Request made of family/significant other to:  Remove weapons (e.g., guns, rifles, knives), all items previously/currently identified as safety concern.    Remove drugs/medications (over-the-counter, prescriptions, illicit drugs), all items previously/currently identified as a safety concern.  The family member/significant other verbalizes understanding of the suicide prevention education information provided.  The family member/significant other agrees to remove the items of safety concern listed above.  SPE and aftercare options reviewed with pt's girlfriend. She does not want pt going back to [facility] due to "poor psychiatric care" and is hoping for either residential treatment or [facility] or [facility]. CSW informed pt's girlfriend that referrals have been made for residential treatment, but he will likely be delcined due to being prescribed narcotic pain medication. She also gave CSW list of medications that she  believed did not work well for pt in the past including: trazodone, abilify (insomnia) , prozac, clonidine, depaokote, restiril. MD notified of conversation and plans to keep pt on ability to find out if this medication works well by itself before making more changes. [name] shared that pt does not have access to firearms but is a sword and Engineer, civil (consulting)knife collector. She plans to take all weapons and lock them up for when pt returns home.   Benjamin RavensHeather S Kennesha Brewbaker LCSW 11/14/2017, 3:28 PM

## 2017-11-14 NOTE — Progress Notes (Signed)
CSW met with pt individually to discuss aftercare options. Pt interested in residential referral "closeby." He states that he is not willing to stop taking narcotic pain medication due to chronic pain issues and is aware that he likely will not be eligible for residential substance abuse treatment. He agreed to [facility] and [facility] referrals "just to see if they will take me." Pt is agreeable to returning home and resuming outpatient/SAIOP services with [facility] if not accepted into residential treatment.  Ikea Demicco S. Ouida Sills, MSW, LCSW Clinical Social Worker 11/14/2017 10:22 AM

## 2017-11-15 NOTE — Plan of Care (Signed)
Pt progressing in the following metrics  D: Pt found in bed; compliant with medication administration. Pt states he slept ok. Pt rates his depression/hopelessness/anxiety 7/6/6 out of 10 respectively. Pt states he is still having back pain and rates this at an 8/10. Pt states his goal for today is to feel better and he will achieve this by going to groups. Pt denies any si/hi/ah/vh and verbally agrees to approach staff if these become apparent.  A: pt provided support and encouragement. Pt given meds per protocol and standing orders. [id number] safety checks implemented and continued.  R: pt safe on the unit. Will continue to monitor.   Problem: Education: Goal: Knowledge of [facility] Education information/materials will improve Outcome: Progressing   Problem: Activity: Goal: Sleeping patterns will improve Outcome: Progressing   Problem: Coping: Goal: Ability to verbalize frustrations and anger appropriately will improve Outcome: Progressing Goal: Ability to demonstrate self-control will improve Outcome: Progressing   Problem: Health Behavior/Discharge Planning: Goal: Identification of resources available to assist in meeting health care needs will improve Outcome: Progressing Goal: Compliance with treatment plan for underlying cause of condition will improve Outcome: Progressing   Problem: Physical Regulation: Goal: Ability to maintain clinical measurements within normal limits will improve Outcome: Progressing

## 2017-11-15 NOTE — Progress Notes (Signed)
[facility] MD Progress Note  11/15/2017 2:34 PM Al Bracewell  MRN:  [id number]  Subjective: Benjamin [name] reports, "I'm doing okay today, not doing too bad at all. The medicine they gave me this morning for anxiety helped. I'm thinking better & clearly. But, I'm still feeling hurt & upset about what happened to me at that hotel room. I could been dead if I was not found in time. I'm still thinking & asking myself what happened? How did I get on the floor? Did I fall & pass out or passed out & fell. I guest I will never know the answer to this Question.  Benjamin [name] is a 52 y/o M wit history of treatment for depression, anxiety, and polysubstance abuse who was admitted voluntarily from [facility] with worsening depression, anxiety, SI with plan to cut himself, and worsening relapse of multiple illicit substances including alcohol, cocaine, and heroin. Pt was medically cleared and then transferred to [facility] for additional treatment and stabilization. Upon initial interview, pt shares, "I wasn't feeling my usual self for a couple of weeks. I told my therapist and doctor, and they tried changing a couple of things. They took me off the trintellix and I ended up on like three different psych meds at the same time, and I felt confused. I was not taking things the right way, and things were not in order. I had anxiety and panic attacks, and then I got a bottle of liquor, cocaine, and some heroin, and I started binging.  [name] is seen, chart reviewed. The chart findings discussed with the treatment team. He presents alert, oriented & aware of situation. He is visible on the unit, attending group sessions. His affect is good & he is making good eye contact. He says he is doing much better, however, continues to ruminate on his passing out incident & being found in a hotel room after drinking & drugging heavily. He says he is doing well on his medications. He denies any side effects. He currently denies any SIHI, AVH, delusional thoughts  or paranoia. He does not appear to be responding to any internal stimuli. Shaan has agreed to continue his current plan of care already in progress.  Principal Problem: Major depressive disorder, recurrent episode, severe (HCC)  Diagnosis:   Patient Active Problem List   Diagnosis Date Noted  . Major depressive disorder, recurrent episode, severe (HCC) [F33.2] 08/24/2015    Priority: High  . Routine general medical examination at a health care facility [Z00.00] 05/24/2015    Priority: Medium  . Polysubstance dependence including opioid drug with daily use (HCC) [F19.20] 11/14/2017  . Suicidal ideation [R45.851] 11/13/2017  . Anxiety and depression [F41.9, F32.9]   . Acute renal failure (HCC) [N17.9]   . Altered mental status [R41.82]   . Cocaine use disorder (HCC) [F14.10]   . AKI (acute kidney injury) (HCC) [N17.9] 11/07/2017  . Leukocytosis [D72.829] 11/07/2017  . Chronic kidney disease (CKD), stage III (moderate) (HCC) [N18.3] 09/19/2016  . Elevated LFTs [R94.5] 09/19/2016  . Insomnia [G47.00]   . MDD (major depressive disorder), single episode, severe , no psychosis (HCC) [F32.2] 08/14/2015  . Cannabis use disorder, mild, abuse [F12.10] 08/14/2015  . Cocaine abuse (HCC) [F14.10] 08/10/2015  . Weight loss, non-intentional [R63.4] 08/10/2015  . Depression [F32.9] 08/10/2015  . Essential hypertension [I10]   . Vaccine refused by patient [Z28.20] 05/24/2015  . Chronic back pain [M54.9, G89.29] 05/24/2015  . Family history of cancer [Z80.9] 05/24/2015  . Screening for prostate cancer [Z12.5]  05/24/2015  . Smoker [F17.200] 05/24/2015  . Screen for colon cancer [Z12.11] 05/24/2015  . Erectile dysfunction [N52.9] 01/31/2014   Total Time spent with patient: 25 minutes  Past Psychiatric History: See H&P  Past Medical History:  Past Medical History:  Diagnosis Date  . Chronic back pain    managed by [facility]  . CKD (chronic kidney disease), stage III (HCC)  10/2017  . Erectile dysfunction   . History of substance abuse   . Hx of suicide attempt   . Hypertension     Past Surgical History:  Procedure Laterality Date  . ANKLE SURGERY     right  . APPLICATION OF WOUND VAC Right 08/17/2015   Procedure: APPLICATION OF WOUND VAC;  Surgeon: Dominica Severin, MD;  Location: [facility] OR;  Service: Orthopedics;  Laterality: Right;  . HAND SURGERY     fracture, ORIG, teenage years, right.  . I&D EXTREMITY Right 08/10/2015   Procedure: IRRIGATION AND DEBRIDEMENT SKIN, SUBCUTANEOS TISSUE AND  MUSCLE, CARPAL TUNNEL RELEASE, MEDIAN NERVE LYSIS WITH NERVE WRAP;  Surgeon: Dominica Severin, MD;  Location: [facility] OR;  Service: Orthopedics;  Laterality: Right;  . INCISION AND DRAINAGE OF WOUND Right 08/15/2015   Procedure: IRRIGATION AND DEBRIDEMENT WOUND;  Surgeon: Francena Hanly, MD;  Location: [facility];  Service: Orthopedics;  Laterality: Right;  . LUMBAR EPIDURAL INJECTION     [facility]  . SKIN SPLIT GRAFT Right 08/17/2015   Procedure: I&D RIGHT FOREARM/POSSIBLE SKIN GRAFT;  Surgeon: Dominica Severin, MD;  Location: [facility] OR;  Service: Orthopedics;  Laterality: Right;  . TENDON REPAIR N/A 07/24/2015   Procedure: WRIST LACERATION AND TENDON REPAIR;  Surgeon: Bradly Bienenstock, MD;  Location: [facility] OR;  Service: Orthopedics;  Laterality: N/A;  . WOUND EXPLORATION Right 08/15/2015   Procedure: WOUND EXPLORATION right forearm;  Surgeon: Francena Hanly, MD;  Location: [facility];  Service: Orthopedics;  Laterality: Right;   Family History:  Family History  Problem Relation Age of Onset  . Cancer Mother        lung  . Anxiety disorder Mother   . Cancer Father        throat  . Diabetes Paternal Grandmother   . Cancer Paternal Aunt   . Heart disease Neg Hx   . Stroke Neg Hx    Family Psychiatric  History: See H&P  Social History:  Social History   Substance and Sexual Activity  Alcohol Use Yes  . Alcohol/week: 0.0 standard drinks   Comment: occasional     Social History    Substance and Sexual Activity  Drug Use Yes  . Types: Marijuana, Cocaine, Heroin   Comment: last use cocaine 08/08/15, last use MJ 08/08/15    Social History   Socioeconomic History  . Marital status: Single    Spouse name: Not on file  . Number of children: Not on file  . Years of education: Not on file  . Highest education level: Not on file  Occupational History  . Not on file  Social Needs  . Financial resource strain: Not on file  . Food insecurity:    Worry: Not on file    Inability: Not on file  . Transportation needs:    Medical: Not on file    Non-medical: Not on file  Tobacco Use  . Smoking status: Current Every Day Smoker    Packs/day: 0.50    Years: 6.00    Pack years: 3.00    Types: Cigarettes  .  Smokeless tobacco: Never Used  Substance and Sexual Activity  . Alcohol use: Yes    Alcohol/week: 0.0 standard drinks    Comment: occasional  . Drug use: Yes    Types: Marijuana, Cocaine, Heroin    Comment: last use cocaine 08/08/15, last use MJ 08/08/15  . Sexual activity: Yes  Lifestyle  . Physical activity:    Days per week: Not on file    Minutes per session: Not on file  . Stress: Not on file  Relationships  . Social connections:    Talks on phone: Not on file    Gets together: Not on file    Attends religious service: Not on file    Active member of club or organization: Not on file    Attends meetings of clubs or organizations: Not on file    Relationship status: Not on file  Other Topics Concern  . 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   No children.  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   09/2017.    Additional Social History:   Sleep: Good  Appetite:  Good  Current Medications: Current Facility-Administered Medications  Medication Dose Route Frequency Provider Last Rate Last Dose  . amLODipine (NORVASC) tablet 5 mg  5 mg Oral Daily Rankin, Shuvon B,  NP   5 mg at 11/15/17 0843  . ARIPiprazole (ABILIFY) tablet 10 mg  10 mg Oral Daily Micheal Likensainville, Christopher T, MD   10 mg at 11/15/17 0843  . ferrous sulfate tablet 325 mg  325 mg Oral Q breakfast Rankin, Shuvon B, NP   325 mg at 11/15/17 0842  . folic acid (FOLVITE) tablet 1 mg  1 mg Oral Daily Rankin, Shuvon B, NP   1 mg at 11/15/17 0843  . HYDROcodone-acetaminophen (NORCO) 10-325 MG per tablet 1 tablet  1 tablet Oral Q4H PRN Rankin, Shuvon B, NP   1 tablet at 11/15/17 0847  . hydrOXYzine (ATARAX/VISTARIL) tablet 50 mg  50 mg Oral Q6H PRN Micheal Likensainville, Christopher T, MD   50 mg at 11/14/17 2119  . loratadine (CLARITIN) tablet 10 mg  10 mg Oral Daily PRN Rankin, Shuvon B, NP      . naphazoline-pheniramine (NAPHCON-A) 0.025-0.3 % ophthalmic solution 1 drop  1 drop Both Eyes QID PRN Rankin, Shuvon B, NP      . nicotine (NICODERM CQ - dosed in mg/24 hours) patch 21 mg  21 mg Transdermal Daily PRN Rankin, Shuvon B, NP      . polyethylene glycol (MIRALAX / GLYCOLAX) packet 17 g  17 g Oral BID Rankin, Shuvon B, NP      . senna-docusate (Senokot-S) tablet 1 tablet  1 tablet Oral BID Rankin, Shuvon B, NP      . thiamine (VITAMIN B-1) tablet 100 mg  100 mg Oral Daily Rankin, Shuvon B, NP   100 mg at 11/15/17 0843  . zolpidem (AMBIEN) tablet 5 mg  5 mg Oral QHS PRN Micheal Likensainville, Christopher T, MD       Lab Results: No results found for this or any previous visit (from the past 48 hour(s)).  Blood Alcohol level:  Lab Results  Component Value Date   ETH <10 11/07/2017   ETH <5 08/10/2015   Metabolic Disorder Labs: Lab Results  Component Value Date   HGBA1C 5.6 08/31/2016   MPG 114 08/31/2016   MPG 120 (H) 05/24/2015   No results found for: PROLACTIN Lab  Results  Component Value Date   CHOL 254 (H) 10/08/2017   TRIG 183 (H) 10/08/2017   HDL 58 10/08/2017   CHOLHDL 4.4 10/08/2017   VLDL 33 (H) 08/31/2016   LDLCALC 159 (H) 10/08/2017   LDLCALC 129 (H) 08/31/2016   Physical Findings: AIMS: Facial  and Oral Movements Muscles of Facial Expression: None, normal Lips and Perioral Area: None, normal Jaw: None, normal Tongue: None, normal,Extremity Movements Upper (arms, wrists, hands, fingers): None, normal Lower (legs, knees, ankles, toes): None, normal, Trunk Movements Neck, shoulders, hips: None, normal, Overall Severity Severity of abnormal movements (highest score from questions above): None, normal Incapacitation due to abnormal movements: None, normal Patient's awareness of abnormal movements (rate only patient's report): No Awareness, Dental Status Current problems with teeth and/or dentures?: No Does patient usually wear dentures?: No  CIWA:  CIWA-Ar Total: 1 COWS:  COWS Total Score: 1  Musculoskeletal: Strength & Muscle Tone: within normal limits Gait & Station: normal Patient leans: N/A  Psychiatric Specialty Exam: Physical Exam  Nursing note and vitals reviewed.   Review of Systems  Psychiatric/Behavioral: Positive for depression and substance abuse (Hx. Cocaine use disorder). Negative for hallucinations, memory loss and suicidal ideas. The patient is nervous/anxious. The patient does not have insomnia.     Blood pressure (!) 168/94, pulse (!) 52, temperature 98.2 F (36.8 C), temperature source Oral, resp. rate 20, height 6\' 2"  (1.88 m), weight 99.8 kg.Body mass index is 28.25 kg/m.  General Appearance: Casual and Fairly Groomed  Eye Contact:  Good  Speech:  Clear and Coherent and Normal Rate  Volume:  Normal  Mood:  Anxious and Depressed  Affect:  Appropriate and Congruent  Thought Process:  Coherent and Goal Directed  Orientation:  Full (Time, Place, and Person)  Thought Content:  Logical  Suicidal Thoughts:  No  Homicidal Thoughts:  No  Memory:  Immediate;   Good Recent;   Good Remote;   Good  Judgement:  Poor  Insight:  Lacking  Psychomotor Activity:  Normal  Concentration:  Concentration: Fair  Recall:  [employer] of Knowledge:  Fair  Language:   Fair  Akathisia:  No  Handed:    AIMS (if indicated):     Assets:  Resilience Social Support  ADL's:  Intact  Cognition:  WNL  Sleep: 6.0       Treatment Plan Summary: Daily contact with patient to assess and evaluate symptoms and progress in treatment.  -Continue inpatient level of care.  Will continue today 11/15/2017 plan as below except where it is noted.  -MDD, recurrent, severe, without psychosis             -Start abilify 10mg  po qDay  -Anxiety             -DC'ed klonopin             -Continue vistaril 50mg  po q6h prn anxiety  -HTN              -Continue norvasc 5mg  po qDay  -microcytic anemia             -Continue ferrous sulfate 325mg  po qAM  -seasonal allergy             -Continue claritin 10mg  po qDay             -Continue Naphcon-A 1 drop both eyes QID prn eye irritation.    -Chronic pain             -Continue Norco  10-325mg  1 tablet q4h prn severe pain (resuming home regimen)  -insomnia             -Continue ambien 5mg  po qhs prn insomnia  -Encourage participation in groups and therapeutic milieu  -disposition planning will be ongoing  Armandina Stammer, NP, PMHNP, FNP-BC. 11/15/2017, 2:34 PM

## 2017-11-15 NOTE — Progress Notes (Signed)
[facility] as [facility], due to medical issues/prescribed pain medication. [facility]   Paxtyn Boyar S. Alan RipperHolloway, MSW, LCSW Clinical Social Worker 11/15/2017 2:56 PM

## 2017-11-15 NOTE — [facility] Group Notes (Signed)
LCSW Group Therapy Note   11/15/2017 1:15pm   Type of Therapy and Topic:  Group Therapy:  Overcoming Obstacles   Participation Level:  Did Not Attend--pt invited. Chose to remain in bed.    Description of Group:    In this group patients will be encouraged to explore what they see as obstacles to their own wellness and recovery. They will be guided to discuss their thoughts, feelings, and behaviors related to these obstacles. The group will process together ways to cope with barriers, with attention given to specific choices patients can make. Each patient will be challenged to identify changes they are motivated to make in order to overcome their obstacles. This group will be process-oriented, with patients participating in exploration of their own experiences as well as giving and receiving support and challenge from other group members.   Therapeutic Goals: 1. Patient will identify personal and current obstacles as they relate to admission. 2. Patient will identify barriers that currently interfere with their wellness or overcoming obstacles.  3. Patient will identify feelings, thought process and behaviors related to these barriers. 4. Patient will identify two changes they are willing to make to overcome these obstacles:      Summary of Patient Progress   x   Therapeutic Modalities:   Cognitive Behavioral Therapy Solution Focused Therapy Motivational Interviewing Relapse Prevention Therapy  Rona RavensHeather S Tailyn Hantz, LCSW 11/15/2017 1:02 PM

## 2017-11-15 NOTE — Progress Notes (Signed)
[facility] made. Authorization number through [facility]: [id number] from 11/15/17 to 11/21/17. Pt in review.  Mahesh Sizemore S. Alan RipperHolloway, MSW, LCSW Clinical Social Worker 11/15/2017 10:35 AM

## 2017-11-16 MED ORDER — GABAPENTIN 300 MG PO CAPS
300.0000 mg | ORAL_CAPSULE | Freq: Three times a day (TID) | ORAL | Status: DC
  Administered 2017-11-16 – 2017-11-17 (×4): 300 mg via ORAL
  Filled 2017-11-16 (×9): qty 1

## 2017-11-16 NOTE — Progress Notes (Signed)
[facility] MD Progress Note  11/16/2017 3:01 PM Benjamin [name]  MRN:  [id number] Subjective:    Benjamin [name] is a 52 y/o M wit history of treatment for depression, anxiety, and polysubstance abuse who was admitted voluntarily from [facility] with worsening depression, anxiety, SI with plan to cut himself, and worsening relapse of multiple illicit substances including alcohol, cocaine, and heroin. Pt was medically cleared and then transferred to [facility] for additional treatment and stabilization. Pt was started on trial of monotherapy with abilify to help simplify his home regimen, and he was monitored on the inpatient unit.  Today upon evaluation, pt shares, "Things have been getting better, I'm just still feeling really anxious." Pt denies other specific concerns. He is sleeping well. His appetite is good. He denies other physical complaints. He denies SI/HI/AH/VH. He is tolerating his medications well, but he feels PRN of vistaril has not been helpful for his anxiety. We discussed starting trial of gabapentin, and pt was in agreement. He was in agreement with the above plan, and he had no further questions, comments, or concerns.   Principal Problem: Major depressive disorder, recurrent episode, severe (HCC) Diagnosis:   Patient Active Problem List   Diagnosis Date Noted  . Polysubstance dependence including opioid drug with daily use (HCC) [F19.20] 11/14/2017  . Suicidal ideation [R45.851] 11/13/2017  . Anxiety and depression [F41.9, F32.9]   . Acute renal failure (HCC) [N17.9]   . Altered mental status [R41.82]   . Cocaine use disorder (HCC) [F14.10]   . AKI (acute kidney injury) (HCC) [N17.9] 11/07/2017  . Leukocytosis [D72.829] 11/07/2017  . Chronic kidney disease (CKD), stage III (moderate) (HCC) [N18.3] 09/19/2016  . Elevated LFTs [R94.5] 09/19/2016  . Major depressive disorder, recurrent episode, severe (HCC) [F33.2] 08/24/2015  . Insomnia [G47.00]   . MDD (major depressive disorder), single  episode, severe , no psychosis (HCC) [F32.2] 08/14/2015  . Cannabis use disorder, mild, abuse [F12.10] 08/14/2015  . Cocaine abuse (HCC) [F14.10] 08/10/2015  . Weight loss, non-intentional [R63.4] 08/10/2015  . Depression [F32.9] 08/10/2015  . Essential hypertension [I10]   . Vaccine refused by patient [Z28.20] 05/24/2015  . Routine general medical examination at a health care facility [Z00.00] 05/24/2015  . Chronic back pain [M54.9, G89.29] 05/24/2015  . Family history of cancer [Z80.9] 05/24/2015  . Screening for prostate cancer [Z12.5] 05/24/2015  . Smoker [F17.200] 05/24/2015  . Screen for colon cancer [Z12.11] 05/24/2015  . Erectile dysfunction [N52.9] 01/31/2014   Total Time spent with patient: 30 minutes  Past Psychiatric History: see H&P  Past Medical History:  Past Medical History:  Diagnosis Date  . Chronic back pain    managed by [facility]  . CKD (chronic kidney disease), stage III (HCC) 10/2017  . Erectile dysfunction   . History of substance abuse   . Hx of suicide attempt   . Hypertension     Past Surgical History:  Procedure Laterality Date  . ANKLE SURGERY     right  . APPLICATION OF WOUND VAC Right 08/17/2015   Procedure: APPLICATION OF WOUND VAC;  Surgeon: Dominica Severin, MD;  Location: [facility] OR;  Service: Orthopedics;  Laterality: Right;  . HAND SURGERY     fracture, ORIG, teenage years, right.  . I&D EXTREMITY Right 08/10/2015   Procedure: IRRIGATION AND DEBRIDEMENT SKIN, SUBCUTANEOS TISSUE AND  MUSCLE, CARPAL TUNNEL RELEASE, MEDIAN NERVE LYSIS WITH NERVE WRAP;  Surgeon: Dominica Severin, MD;  Location: [facility] OR;  Service: Orthopedics;  Laterality: Right;  . INCISION  AND DRAINAGE OF WOUND Right 08/15/2015   Procedure: IRRIGATION AND DEBRIDEMENT WOUND;  Surgeon: Francena Hanly, MD;  Location: [facility];  Service: Orthopedics;  Laterality: Right;  . LUMBAR EPIDURAL INJECTION     [facility]  . SKIN SPLIT GRAFT Right 08/17/2015   Procedure:  I&D RIGHT FOREARM/POSSIBLE SKIN GRAFT;  Surgeon: Dominica Severin, MD;  Location: [facility] OR;  Service: Orthopedics;  Laterality: Right;  . TENDON REPAIR N/A 07/24/2015   Procedure: WRIST LACERATION AND TENDON REPAIR;  Surgeon: Bradly Bienenstock, MD;  Location: [facility] OR;  Service: Orthopedics;  Laterality: N/A;  . WOUND EXPLORATION Right 08/15/2015   Procedure: WOUND EXPLORATION right forearm;  Surgeon: Francena Hanly, MD;  Location: [facility];  Service: Orthopedics;  Laterality: Right;   Family History:  Family History  Problem Relation Age of Onset  . Cancer Mother        lung  . Anxiety disorder Mother   . Cancer Father        throat  . Diabetes Paternal Grandmother   . Cancer Paternal Aunt   . Heart disease Neg Hx   . Stroke Neg Hx    Family Psychiatric  History: see H&P Social History:  Social History   Substance and Sexual Activity  Alcohol Use Yes  . Alcohol/week: 0.0 standard drinks   Comment: occasional     Social History   Substance and Sexual Activity  Drug Use Yes  . Types: Marijuana, Cocaine, Heroin   Comment: last use cocaine 08/08/15, last use MJ 08/08/15    Social History   Socioeconomic History  . Marital status: Single    Spouse name: Not on file  . Number of children: Not on file  . Years of education: Not on file  . Highest education level: Not on file  Occupational History  . Not on file  Social Needs  . Financial resource strain: Not on file  . Food insecurity:    Worry: Not on file    Inability: Not on file  . Transportation needs:    Medical: Not on file    Non-medical: Not on file  Tobacco Use  . Smoking status: Current Every Day Smoker    Packs/day: 0.50    Years: 6.00    Pack years: 3.00    Types: Cigarettes  . Smokeless tobacco: Never Used  Substance and Sexual Activity  . Alcohol use: Yes    Alcohol/week: 0.0 standard drinks    Comment: occasional  . Drug use: Yes    Types: Marijuana, Cocaine, Heroin    Comment: last use cocaine 08/08/15, last  use MJ 08/08/15  . Sexual activity: Yes  Lifestyle  . Physical activity:    Days per week: Not on file    Minutes per session: Not on file  . Stress: Not on file  Relationships  . Social connections:    Talks on phone: Not on file    Gets together: Not on file    Attends religious service: Not on file    Active member of club or organization: Not on file    Attends meetings of clubs or organizations: Not on file    Relationship status: Not on file  Other Topics Concern  . 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   No children.  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   09/2017.    Additional Social History:  Sleep: Good  Appetite:  Good  Current Medications: Current Facility-Administered Medications  Medication Dose Route Frequency Provider Last Rate Last Dose  . amLODipine (NORVASC) tablet 5 mg  5 mg Oral Daily Rankin, Shuvon B, NP   5 mg at 11/16/17 0836  . ARIPiprazole (ABILIFY) tablet 10 mg  10 mg Oral Daily Micheal Likensainville, Pairlee Sawtell T, MD   10 mg at 11/16/17 0836  . ferrous sulfate tablet 325 mg  325 mg Oral Q breakfast Rankin, Shuvon B, NP   325 mg at 11/16/17 0837  . folic acid (FOLVITE) tablet 1 mg  1 mg Oral Daily Rankin, Shuvon B, NP   1 mg at 11/16/17 0836  . gabapentin (NEURONTIN) capsule 300 mg  300 mg Oral TID Micheal Likensainville, Caeson Filippi T, MD   300 mg at 11/16/17 1215  . HYDROcodone-acetaminophen (NORCO) 10-325 MG per tablet 1 tablet  1 tablet Oral Q4H PRN Rankin, Shuvon B, NP   1 tablet at 11/16/17 1315  . hydrOXYzine (ATARAX/VISTARIL) tablet 50 mg  50 mg Oral Q6H PRN Micheal Likensainville, Ladelle Teodoro T, MD   50 mg at 11/14/17 2119  . loratadine (CLARITIN) tablet 10 mg  10 mg Oral Daily PRN Rankin, Shuvon B, NP      . naphazoline-pheniramine (NAPHCON-A) 0.025-0.3 % ophthalmic solution 1 drop  1 drop Both Eyes QID PRN Rankin, Shuvon B, NP      . nicotine (NICODERM CQ -  dosed in mg/24 hours) patch 21 mg  21 mg Transdermal Daily PRN Rankin, Shuvon B, NP      . polyethylene glycol (MIRALAX / GLYCOLAX) packet 17 g  17 g Oral BID Rankin, Shuvon B, NP      . senna-docusate (Senokot-S) tablet 1 tablet  1 tablet Oral BID Rankin, Shuvon B, NP      . thiamine (VITAMIN B-1) tablet 100 mg  100 mg Oral Daily Rankin, Shuvon B, NP   100 mg at 11/16/17 0837  . zolpidem (AMBIEN) tablet 5 mg  5 mg Oral QHS PRN Micheal Likensainville, Derrisha Foos T, MD   5 mg at 11/15/17 2236    Lab Results: No results found for this or any previous visit (from the past 48 hour(s)).  Blood Alcohol level:  Lab Results  Component Value Date   ETH <10 11/07/2017   ETH <5 08/10/2015    Metabolic Disorder Labs: Lab Results  Component Value Date   HGBA1C 5.6 08/31/2016   MPG 114 08/31/2016   MPG 120 (H) 05/24/2015   No results found for: PROLACTIN Lab Results  Component Value Date   CHOL 254 (H) 10/08/2017   TRIG 183 (H) 10/08/2017   HDL 58 10/08/2017   CHOLHDL 4.4 10/08/2017   VLDL 33 (H) 08/31/2016   LDLCALC 159 (H) 10/08/2017   LDLCALC 129 (H) 08/31/2016    Physical Findings: AIMS: Facial and Oral Movements Muscles of Facial Expression: None, normal Lips and Perioral Area: None, normal Jaw: None, normal Tongue: None, normal,Extremity Movements Upper (arms, wrists, hands, fingers): None, normal Lower (legs, knees, ankles, toes): None, normal, Trunk Movements Neck, shoulders, hips: None, normal, Overall Severity Severity of abnormal movements (highest score from questions above): None, normal Incapacitation due to abnormal movements: None, normal Patient's awareness of abnormal movements (rate only patient's report): No Awareness, Dental Status Current problems with teeth and/or dentures?: No Does patient usually wear dentures?: No  CIWA:  CIWA-Ar Total: 1 COWS:  COWS Total Score: 1  Musculoskeletal: Strength & Muscle Tone: within normal limits Gait & Station: normal Patient  leans:  Backward  Psychiatric Specialty Exam: Physical Exam  Nursing note and vitals reviewed.   Review of Systems  Constitutional: Negative for chills and fever.  Respiratory: Negative for cough and shortness of breath.   Cardiovascular: Negative for chest pain.  Gastrointestinal: Negative for abdominal pain, heartburn, nausea and vomiting.  Psychiatric/Behavioral: Negative for depression, hallucinations and suicidal ideas. The patient is nervous/anxious. The patient does not have insomnia.     Blood pressure (!) 146/93, pulse (!) 59, temperature 98.1 F (36.7 C), temperature source Oral, resp. rate 20, height 6\' 2"  (1.88 m), weight 99.8 kg.Body mass index is 28.25 kg/m.  General Appearance: Casual and Fairly Groomed  Eye Contact:  Good  Speech:  Clear and Coherent and Normal Rate  Volume:  Normal  Mood:  Anxious  Affect:  Appropriate and Congruent  Thought Process:  Coherent and Goal Directed  Orientation:  Full (Time, Place, and Person)  Thought Content:  Logical  Suicidal Thoughts:  No  Homicidal Thoughts:  No  Memory:  Immediate;   Fair Recent;   Fair Remote;   Fair  Judgement:  Poor  Insight:  Lacking  Psychomotor Activity:  Normal  Concentration:  Concentration: Fair  Recall:  [employer] of Knowledge:  Fair  Language:  Fair  Akathisia:  No  Handed:    AIMS (if indicated):     Assets:  Physical Health Resilience  ADL's:  Intact  Cognition:  WNL  Sleep:  Number of Hours: 5.25   Treatment Plan Summary: Daily contact with patient to assess and evaluate symptoms and progress in treatment and Medication management   -Continue inpatient level of care.  Will continue today 11/16/2017 plan as below except where it is noted.  -MDD, recurrent, severe, without psychosis -Continue abilify 10mg  po qDay  -Anxiety -Start gabapentin 300mg  po TID -Continue vistaril 50mg  po q6h prn anxiety  -HTN -Continue norvasc 5mg   po qDay  -microcytic anemia -Continue ferrous sulfate 325mg  po qAM  -seasonal allergy -Continue claritin 10mg  po qDay -Continue Naphcon-A 1 drop both eyes QID prn eye irritation.   -Chronic pain -Continue Norco 10-325mg  1 tablet q4h prn severe pain (resuming home regimen)  -insomnia -Continue ambien 5mg  po qhs prn insomnia  -Encourage participation in groups and therapeutic milieu  -disposition planning will be ongoing   Micheal Likens, MD 11/16/2017, 3:01 PM

## 2017-11-16 NOTE — Progress Notes (Signed)
D: Patient alert and oriented. Affect/mood: Blunted in affect, depressed in mood though pleasant and brightens minimally during interaction. Denies SI, HI, AVH at this time, though endorses SI thoughts per self inventory sheet which occur "sometimes". Denies any withdrawal symptoms when asked. Endorses generalized pain, as well as area specific pain (back) throughout the day. Patient receives PRN norco when requested. Goal: "to feel better about life". When asked how patient intends to accomplish this goal, patient states "I'm just going to do what I need to do". Patient also shares that he plans to talk more often about his thoughts. Patient endorses "fair" sleep last night with the use of sleep aid medication. Patient rates feelings of depression, hopelessness, and anxiety "7" (0-10). Patient verbalizes understanding of newly ordered Neurontin after providing medication teaching. Encouraged to notify if additional questions or concerns arise. Patient refused medications for constipation, stating that this issue is resolved.  A: Scheduled medications administered to patient per MD order. Support and encouragement provided. Routine safety checks conducted every 15 minutes. Patient informed to notify staff with problems or concerns.  R: No adverse drug reactions noted. Patient contracts for safety at this time. Patient compliant with medications and treatment plan, though did not attend morning group and is often observed retreating to his room after a brief time in the dayroom. Patient remains cooperative. Patient interacts well with others on the unit. Patient remains safe at this time. Will continue to monitor.

## 2017-11-16 NOTE — Progress Notes (Signed)
Recreation Therapy Notes  Date: 8.23.19 Time: 0930 Location: [address] Dayroom  Group Topic: Stress Management  Goal Area(s) Addresses:  Patient will verbalize importance of using healthy stress management.  Patient will identify positive emotions associated with healthy stress management.   Intervention: Stress Management  Activity :  Progressive Muscle Relaxation.  LRT introduced the stress management technique of progressive muscle relaxation.  LRT read a script on tensing then relaxing each muscle group individually.  Patients were to follow along as LRT read the script to engage in the activity.  Education:  Stress Management, Discharge Planning.   Education Outcome: Acknowledges edcuation/In group clarification offered/Needs additional education  Clinical Observations/Feedback: Patient did not attend group.    Izella Ybanez, LRT/CTRS         Evelyna Folker A 11/16/2017 12:16 PM 

## 2017-11-16 NOTE — Progress Notes (Signed)
The patient described his day as having been "all right". In addition, he states that he feels better (better about himself and life). His goal for tomorrow is to have a better day and to have more energy.

## 2017-11-16 NOTE — Progress Notes (Signed)
Pt reports he had a good day.  He feels the medications are helping his mood and his overall feelings about the future.  He denies SI/HI/AVH.  He reports that he will probably be here through the weekend and discharge on Monday.  He makes his needs known to staff.  He is receiving his Norco 10/325 mg as ordered for his chronic back pain.  He has been out in the milieu and is interacting with his peers.  Support and encouragement offered.  Discharge plans are in process.  Safety maintained with q15 minute checks.

## 2017-11-16 NOTE — Progress Notes (Signed)
Pt reports his day had been good.  He denies SI/HI/AVH.  He does say he is hopeful for a better night's sleep and requested another medication for sleep.  Writer checked his MAR and noticed that he had Ambien 5 mg ordered prn.  Writer informed pt who was agreeable to take the Ambien at 2230 when his prn Norco was due.  He voiced no other needs this evening.  Support and encouragement offered.  Discharge plans are in process.  Safety maintained with q15 minute checks.

## 2017-11-17 MED ORDER — HYDROXYZINE HCL 25 MG PO TABS
25.0000 mg | ORAL_TABLET | Freq: Four times a day (QID) | ORAL | Status: DC | PRN
  Filled 2017-11-17: qty 1

## 2017-11-17 MED ORDER — GABAPENTIN 100 MG PO CAPS
100.0000 mg | ORAL_CAPSULE | Freq: Three times a day (TID) | ORAL | Status: DC
  Administered 2017-11-17 – 2017-11-21 (×11): 100 mg via ORAL
  Filled 2017-11-17 (×17): qty 1

## 2017-11-17 MED ORDER — TRAZODONE HCL 50 MG PO TABS
50.0000 mg | ORAL_TABLET | Freq: Every evening | ORAL | Status: DC | PRN
  Administered 2017-11-18 – 2017-11-19 (×2): 50 mg via ORAL
  Filled 2017-11-17 (×2): qty 1

## 2017-11-17 NOTE — Progress Notes (Signed)
Patient ID: Benjamin HaagensenJohn [name], male   DOB: Nov 18, 1965, 52 y.o.   MRN: [id number]    D: Pt has been very flat and depressed on the unit today. Pt has also been very isolative, he did not engage in any treatment nor did he attend any groups. Pt reported that his depression was a 6, his hopelessness was a 6, and his anxiety was a 6. Pt reported that his goal for today was to work on himself. Pt has complained of pain all day and has received pain medication every 4 hours. Pt reported that he was positive SI on his patient self inventory, but was able to contract for safety. Pt reported being negative HI, no AH/VH noted. A: 15 min checks continued for patient safety. R: Pt safety maintained.

## 2017-11-17 NOTE — Progress Notes (Signed)
Patient did attend the evening speaker AA meeting.  

## 2017-11-17 NOTE — Progress Notes (Signed)
D.  Pt pleasant on approach, continued complaint of chronic back pain.   Pt was positive for evening AA group, observed engaged in appropriate interaction with peers on the unit.  Pt states chronic back pain never gets below a 4-5 out of 10.  Pt denies SI/HI/AVH at this time.  Pt requested medication for sleep as  His Ambien had been discontinued.  A.  Support and encouragement offered, medication given as ordered.  Spoke with NP about sleep medication and new order for Trazodone 50 mg received.  R.  Pt remains safe on the unit, will contniue to monitor.

## 2017-11-17 NOTE — [facility] Group Notes (Signed)
LCSW Group Therapy Note  11/17/2017   10:00--11:00am   Type of Therapy and Topic:  Group Therapy: Anger Cues and Responses  Participation Level:  Active   Description of Group:   In this group, patients learned how to recognize the physical, cognitive, emotional, and behavioral responses they have to anger-provoking situations.  They identified a recent time they became angry and how they reacted.  They analyzed how their reaction was possibly beneficial and how it was possibly unhelpful.  The group discussed a variety of healthier coping skills that could help with such a situation in the future.  Deep breathing was practiced briefly.  Therapeutic Goals: 1. Patients will remember their last incident of anger and how they felt emotionally and physically, what their thoughts were at the time, and how they behaved. 2. Patients will identify how their behavior at that time worked for them, as well as how it worked against them. 3. Patients will explore possible new behaviors to use in future anger situations. 4. Patients will learn that anger itself is normal and cannot be eliminated, and that healthier reactions can assist with resolving conflict rather than worsening situations.  Summary of Patient Progress:  The patient shared that his most recent incident of anger involved him being angry at himself. He stated used to extent that he hoped it would kill but he only passed out and cut himself. He expressed still feeling angry about that but has decided to move on, to seek help in a residential program and have a "normal" life. He has acquired coping skills for his anger but added that his environment sometimes make them difficult to use. Still, he expressed intent to do his best and added that fear of returning to prison motivates him to avoid conflict as best as he can.  Therapeutic Modalities:   Cognitive Behavioral Therapy  Evorn Gongonnie D Yareth Macdonnell

## 2017-11-17 NOTE — Progress Notes (Signed)
[facility] MD Progress Note  11/17/2017 6:35 PM Benjamin [name]  MRN:  [id number] Subjective:  States he has been feeling vaguely " dizzy, tired ". Objective: I have reviewed chart notes and have met with patient. 52 year old male, lives with GF, employed, admitted for depression, suicidal ideations. He has a history of opiate ( heroin), and cocaine abuse .States he had not been using regularly recently. He has history of prior psychiatric admission for depression and self cutting. Reports he continues to feel depressed, but acknowledges he is " feeling a little better", and is no longer thinking of suicide . Denies hallucinations.  Currently on Abilify, Neurontin, Norco , Ambien QHS. ( Reports he has been on Opiate analgesics for years , related to chronic pain)  Visible in day room. No disruptive or agitated behaviors.    Principal Problem: Major depressive disorder, recurrent episode, severe ([place]) Diagnosis:   Patient Active Problem List   Diagnosis Date Noted  . Polysubstance dependence including opioid drug with daily use ([place]) [F19.20] 11/14/2017  . Suicidal ideation [R45.851] 11/13/2017  . Anxiety and depression [F41.9, F32.9]   . Acute renal failure ([place]) [N17.9]   . Altered mental status [R41.82]   . Cocaine use disorder ([place]) [F14.10]   . AKI (acute kidney injury) ([place]) [N17.9] 11/07/2017  . Leukocytosis [D72.829] 11/07/2017  . Chronic kidney disease (CKD), stage III (moderate) ([place]) [N18.3] 09/19/2016  . Elevated LFTs [R94.5] 09/19/2016  . Major depressive disorder, recurrent episode, severe ([place]) [F33.2] 08/24/2015  . Insomnia [G47.00]   . MDD (major depressive disorder), single episode, severe , no psychosis ([place]) [F32.2] 08/14/2015  . Cannabis use disorder, mild, abuse [F12.10] 08/14/2015  . Cocaine abuse ([place]) [F14.10] 08/10/2015  . Weight loss, non-intentional [R63.4] 08/10/2015  . Depression [F32.9] 08/10/2015  . Essential hypertension [I10]   . Vaccine refused by patient  [Z28.20] 05/24/2015  . Routine general medical examination at a health care facility [Z00.00] 05/24/2015  . Chronic back pain [M54.9, G89.29] 05/24/2015  . Family history of cancer [Z80.9] 05/24/2015  . Screening for prostate cancer [Z12.5] 05/24/2015  . Smoker [F17.200] 05/24/2015  . Screen for colon cancer [Z12.11] 05/24/2015  . Erectile dysfunction [N52.9] 01/31/2014   Total Time spent with patient: 15 minutes  Past Psychiatric History: see H&P  Past Medical History:  Past Medical History:  Diagnosis Date  . Chronic back pain    managed [facility]  . CKD (chronic kidney disease), stage III ([place]) 10/2017  . Erectile dysfunction   . History of substance abuse   . Hx of suicide attempt   . Hypertension     Past Surgical History:  Procedure Laterality Date  . ANKLE SURGERY     right  . APPLICATION OF WOUND VAC Right 08/17/2015   Procedure: APPLICATION OF WOUND VAC;  Surgeon: Roseanne Kaufman, MD;  Location: [place];  Service: Orthopedics;  Laterality: Right;  . HAND SURGERY     fracture, ORIG, teenage years, right.  . I&D EXTREMITY Right 08/10/2015   Procedure: IRRIGATION AND DEBRIDEMENT SKIN, SUBCUTANEOS TISSUE AND  MUSCLE, CARPAL TUNNEL RELEASE, MEDIAN NERVE LYSIS WITH NERVE WRAP;  Surgeon: Roseanne Kaufman, MD;  Location: [place];  Service: Orthopedics;  Laterality: Right;  . INCISION AND DRAINAGE OF WOUND Right 08/15/2015   Procedure: IRRIGATION AND DEBRIDEMENT WOUND;  Surgeon: Justice Britain, MD;  Location: [facility];  Service: Orthopedics;  Laterality: Right;  . LUMBAR EPIDURAL INJECTION     [facility]  . SKIN  SPLIT GRAFT Right 08/17/2015   Procedure: I&D RIGHT FOREARM/POSSIBLE SKIN GRAFT;  Surgeon: Roseanne Kaufman, MD;  Location: [place];  Service: Orthopedics;  Laterality: Right;  . TENDON REPAIR N/A 07/24/2015   Procedure: WRIST LACERATION AND TENDON REPAIR;  Surgeon: Iran Planas, MD;  Location: [place];  Service: Orthopedics;  Laterality: N/A;  . WOUND  EXPLORATION Right 08/15/2015   Procedure: WOUND EXPLORATION right forearm;  Surgeon: Justice Britain, MD;  Location: [facility];  Service: Orthopedics;  Laterality: Right;   Family History:  Family History  Problem Relation Age of Onset  . Cancer Mother        lung  . Anxiety disorder Mother   . Cancer Father        throat  . Diabetes Paternal Grandmother   . Cancer Paternal Aunt   . Heart disease Neg Hx   . Stroke Neg Hx    Family Psychiatric  History: see H&P Social History:  Social History   Substance and Sexual Activity  Alcohol Use Yes  . Alcohol/week: 0.0 standard drinks   Comment: occasional     Social History   Substance and Sexual Activity  Drug Use Yes  . Types: Marijuana, Cocaine, Heroin   Comment: last use cocaine 08/08/15, last use MJ 08/08/15    Social History   Socioeconomic History  . Marital status: Single    Spouse name: Not on file  . Number of children: Not on file  . Years of education: Not on file  . Highest education level: Not on file  Occupational History  . Not on file  Social Needs  . Financial resource strain: Not on file  . Food insecurity:    Worry: Not on file    Inability: Not on file  . Transportation needs:    Medical: Not on file    Non-medical: Not on file  Tobacco Use  . Smoking status: Current Every Day Smoker    Packs/day: 0.50    Years: 6.00    Pack years: 3.00    Types: Cigarettes  . Smokeless tobacco: Never Used  Substance and Sexual Activity  . Alcohol use: Yes    Alcohol/week: 0.0 standard drinks    Comment: occasional  . Drug use: Yes    Types: Marijuana, Cocaine, Heroin    Comment: last use cocaine 08/08/15, last use MJ 08/08/15  . Sexual activity: Yes  Lifestyle  . Physical activity:    Days per week: Not on file    Minutes per session: Not on file  . Stress: Not on file  Relationships  . Social connections:    Talks on phone: Not on file    Gets together: Not on file    Attends religious service: Not on  file    Active member of club or organization: Not on file    Attends meetings of clubs or organizations: Not on file    Relationship status: Not on file  Other Topics Concern  . Not on file  Social History Narrative   Single,prior worked as Geophysicist/field seismologist at [employer].   No children.  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   09/2017.    Additional Social History:   Sleep: improving   Appetite:  Good  Current Medications: Current Facility-Administered Medications  Medication Dose Route Frequency Provider Last Rate Last Dose  . amLODipine (NORVASC) tablet 5 mg  5 mg Oral Daily Rankin, Shuvon B, NP   5 mg at 11/17/17 0944  .  ARIPiprazole (ABILIFY) tablet 10 mg  10 mg Oral Daily Pennelope Bracken, MD   10 mg at 11/17/17 0944  . ferrous sulfate tablet 325 mg  325 mg Oral Q breakfast Rankin, Shuvon B, NP   325 mg at 11/17/17 0943  . folic acid (FOLVITE) tablet 1 mg  1 mg Oral Daily Rankin, Shuvon B, NP   1 mg at 11/17/17 0944  . gabapentin (NEURONTIN) capsule 300 mg  300 mg Oral TID Pennelope Bracken, MD   300 mg at 11/17/17 1127  . HYDROcodone-acetaminophen (NORCO) 10-325 MG per tablet 1 tablet  1 tablet Oral Q4H PRN Rankin, Shuvon B, NP   1 tablet at 11/17/17 1812  . hydrOXYzine (ATARAX/VISTARIL) tablet 50 mg  50 mg Oral Q6H PRN Pennelope Bracken, MD   50 mg at 11/17/17 0541  . loratadine (CLARITIN) tablet 10 mg  10 mg Oral Daily PRN Rankin, Shuvon B, NP      . naphazoline-pheniramine (NAPHCON-A) 0.025-0.3 % ophthalmic solution 1 drop  1 drop Both Eyes QID PRN Rankin, Shuvon B, NP      . nicotine (NICODERM CQ - dosed in mg/24 hours) patch 21 mg  21 mg Transdermal Daily PRN Rankin, Shuvon B, NP      . polyethylene glycol (MIRALAX / GLYCOLAX) packet 17 g  17 g Oral BID Rankin, Shuvon B, NP      . senna-docusate (Senokot-S) tablet 1 tablet  1 tablet Oral BID Rankin, Shuvon B, NP      . thiamine (VITAMIN B-1) tablet 100 mg  100 mg Oral Daily  Rankin, Shuvon B, NP   100 mg at 11/17/17 0944  . zolpidem (AMBIEN) tablet 5 mg  5 mg Oral QHS PRN Pennelope Bracken, MD   5 mg at 11/16/17 2119    Lab Results: No results found for this or any previous visit (from the past 48 hour(s)).  Blood Alcohol level:  Lab Results  Component Value Date   ETH <10 11/07/2017   ETH <5 11/91/4782    Metabolic Disorder Labs: Lab Results  Component Value Date   HGBA1C 5.6 08/31/2016   MPG 114 08/31/2016   MPG 120 (H) 05/24/2015   No results found for: PROLACTIN Lab Results  Component Value Date   CHOL 254 (H) 10/08/2017   TRIG 183 (H) 10/08/2017   HDL 58 10/08/2017   CHOLHDL 4.4 10/08/2017   VLDL 33 (H) 08/31/2016   LDLCALC 159 (H) 10/08/2017   LDLCALC 129 (H) 08/31/2016    Physical Findings: AIMS: Facial and Oral Movements Muscles of Facial Expression: None, normal Lips and Perioral Area: None, normal Jaw: None, normal Tongue: None, normal,Extremity Movements Upper (arms, wrists, hands, fingers): None, normal Lower (legs, knees, ankles, toes): None, normal, Trunk Movements Neck, shoulders, hips: None, normal, Overall Severity Severity of abnormal movements (highest score from questions above): None, normal Incapacitation due to abnormal movements: None, normal Patient's awareness of abnormal movements (rate only patient's report): No Awareness, Dental Status Current problems with teeth and/or dentures?: No Does patient usually wear dentures?: No  CIWA:  CIWA-Ar Total: 1 COWS:  COWS Total Score: 1  Musculoskeletal: Strength & Muscle Tone: within normal limits Gait & Station: normal Patient leans: N/A  Psychiatric Specialty Exam: Physical Exam  Nursing note and vitals reviewed.   Review of Systems  Constitutional: Negative for chills and fever.  Respiratory: Negative for cough and shortness of breath.   Cardiovascular: Negative for chest pain.  Gastrointestinal: Negative for abdominal pain, heartburn,  nausea and  vomiting.  Psychiatric/Behavioral: Negative for depression, hallucinations and suicidal ideas. The patient is nervous/anxious. The patient does not have insomnia.   no headache, no chest pain, no shortness of breath, no vomiting, no fever, no chills , reports vaguely " tired ",   Blood pressure (!) 146/93, pulse (!) 59, temperature 98.1 F (36.7 C), temperature source Oral, resp. rate 20, height 6' 2"  (1.88 m), weight 99.8 kg.Body mass index is 28.25 kg/m.  General Appearance: Well Groomed  Eye Contact:  Good  Speech:  Normal Rate  Volume:  Normal  Mood:  reports improving mood , less depressed   Affect:  vaguely anxious   Thought Process:  Linear and Descriptions of Associations: Intact  Orientation:  Other:  he is alert, attentive and fully oriented x 3   Thought Content:  no hallucinations, no delusions   Suicidal Thoughts:  No denies any suicidal plan or intention, contracts for safety   Homicidal Thoughts:  No denies any homicidal or violent ideations  Memory:  recent and remote grossly intact   Judgement:  Fair  Insight:  Fair  Psychomotor Activity:  Normal  Concentration:  Concentration: Fair and Attention Span: Fair  Recall:  [employer] of Knowledge:  Fair  Language:  Good  Akathisia:  No  Handed:    AIMS (if indicated):     Assets:  Physical Health Resilience  ADL's:  Intact  Cognition:  WNL  Sleep:  Number of Hours: 5.25    Assessment - 52 year old male, history of depression, anxiety, history of substance abuse, admitted due to worsening depression and suicidal ideations. Reports partial improvement since admission, but reports some persistent depression. At this time denies suicidal plan or intention.  Reports feeling  "tired "and mildly dizzy. Presents alert, attentive, oriented x 3, gait is steady . Denies medication side effects. We discussed medication options- reports he has been on opiates for years due to back pain and currently does not feel tapering off is  an option . Will taper Neurontin, Ambien , Vistaril PRN dose to minimize sedation .   Treatment Plan Summary: Treatment Plan reviewed as below today 8/24.  Daily contact with patient to assess and evaluate symptoms and progress in treatment and Medication management  Encourage group and milieu participation to work on coping skills and symptom reduction Continue Abilify 18m QDAY for mood disorder  Decrease Neurontin to 100 mgrs TID for anxiety , pain  Decrease Vistaril to 25 mgrs Q 6 hours PRN for anxiety Discontinue Ambien to minimize sedation Continue Norvasc 538mQDAY for HTN Continue Ferrous Sulfate 32533mDAY for anemia Continue Naphcon-A 1 drop both eyes QID prn eye irritation. Continue Norco 10-325m22mtablet q4h prn severe pain (resuming home regimen) Treatment team working on disposition planning options Repeat BMP and CBC to follow up on anemia, BUN, Creatinine   [facility] 11/17/2017, 6:35 PM   Patient ID: Benjamin [name]   DOB: 5/31Jun 08, 1967 y78.   MRN: [id number]

## 2017-11-17 NOTE — [facility] Group Notes (Signed)
[facility] Group Notes:  (Nursing/MHT/Case Management/Adjunct)  Date:  11/17/2017  Time:  1:38 PM  Type of Therapy:  Psychoeducational Skills  Participation Level:  Did Not Attend  Participation Quality:  Did not attend  Affect:  Did not attend  Cognitive:  Did not attend  Insight:  None  Engagement in Group:  Did not attend  Modes of Intervention:  Did not attend  Summary of Progress/Problems: Pt did not attend Psychoeducational group with topic anger management.  Benjamin [name], Benjamin [name] 11/17/2017, 1:38 PM

## 2017-11-18 LAB — CBC WITH DIFFERENTIAL/PLATELET
BASOS ABS: 0 10*3/uL (ref 0.0–0.1)
Basophils Relative: 0 %
EOS ABS: 0.1 10*3/uL (ref 0.0–0.7)
EOS PCT: 2 %
HCT: 33.8 % — ABNORMAL LOW (ref 39.0–52.0)
Hemoglobin: 10.9 g/dL — ABNORMAL LOW (ref 13.0–17.0)
LYMPHS ABS: 2.2 10*3/uL (ref 0.7–4.0)
Lymphocytes Relative: 39 %
MCH: 26.1 pg (ref 26.0–34.0)
MCHC: 32.2 g/dL (ref 30.0–36.0)
MCV: 80.9 fL (ref 78.0–100.0)
MONO ABS: 0.5 10*3/uL (ref 0.1–1.0)
Monocytes Relative: 9 %
Neutro Abs: 2.8 10*3/uL (ref 1.7–7.7)
Neutrophils Relative %: 50 %
PLATELETS: 294 10*3/uL (ref 150–400)
RBC: 4.18 MIL/uL — AB (ref 4.22–5.81)
RDW: 17.5 % — AB (ref 11.5–15.5)
WBC: 5.6 10*3/uL (ref 4.0–10.5)

## 2017-11-18 LAB — BASIC METABOLIC PANEL
Anion gap: 9 (ref 5–15)
BUN: 12 mg/dL (ref 6–20)
CALCIUM: 9.3 mg/dL (ref 8.9–10.3)
CO2: 27 mmol/L (ref 22–32)
Chloride: 107 mmol/L (ref 98–111)
Creatinine, Ser: 1.7 mg/dL — ABNORMAL HIGH (ref 0.61–1.24)
GFR calc Af Amer: 52 mL/min — ABNORMAL LOW (ref >60)
GFR, EST NON AFRICAN AMERICAN: 45 mL/min — AB (ref >60)
Glucose, Bld: 93 mg/dL (ref 70–99)
POTASSIUM: 3.8 mmol/L (ref 3.5–5.1)
SODIUM: 143 mmol/L (ref 135–145)

## 2017-11-18 MED ORDER — VORTIOXETINE HBR 5 MG PO TABS: 5.0000 mg | ORAL_TABLET | Freq: Every day | ORAL | Status: DC

## 2017-11-18 MED ORDER — VORTIOXETINE HBR 10 MG PO TABS
10.0000 mg | ORAL_TABLET | Freq: Every day | ORAL | Status: DC
  Administered 2017-11-18 – 2017-11-21 (×4): 10 mg via ORAL
  Filled 2017-11-18 (×5): qty 1

## 2017-11-18 NOTE — Progress Notes (Signed)
Patient ID: Mady HaagensenJohn Berti, male   DOB: 01/16/1966, 52 y.o.   MRN: [id number]    D: Pt has been very flat and depressed on the unit today. Pt continued to refuse Abilify, Dr. Jama Flavorsobos was made aware new orders noted. Pt reported that his depression was a 6, his hopelessness was a 5, and his anxiety was a 3. Pt reported that his goal for today was to feel better about living. Pt has complained of pain all day and has received pain medication every 4 hours. . Pt reported being negative SI/HI, no AH/VH noted. A: 15 min checks continued for patient safety. R: Pt safety maintained.

## 2017-11-18 NOTE — [facility] Group Notes (Signed)
[facility] Group Notes:  (Nursing/MHT/Case Management/Adjunct)  Date:  11/18/2017  Time:  1:47 PM  Type of Therapy:  Psychoeducational Skills  Participation Level:  Active  Participation Quality:  Appropriate  Affect:  Appropriate  Cognitive:  Appropriate  Insight:  Appropriate  Engagement in Group:  Engaged  Modes of Intervention:  Problem-solving  Summary of Progress/Problems: Pt attended Psychoeducational group with top topic healthy support systems.   Addam Goeller Shanta 11/18/2017, 1:47 PM 

## 2017-11-18 NOTE — Progress Notes (Signed)
D.  Pt pleasant on approach, continued complaint of chronic back pain.  Pt was positive for evening AA group, observed engaged in appropriate interaction with peers on the unit.  Pt denies SI/HI/AVH at this time.  A.  Support and encouragement offered, medication given as ordered.  Continues to take Norco around the clock with minimal reported relief.  R.  Pt remain safe on the unit, will continue to monitor.

## 2017-11-18 NOTE — Progress Notes (Signed)
[facility] MD Progress Note  11/18/2017 2:43 PM Benjamin [name]  MRN:  [id number] Subjective: Patient states he is feeling better today.  Reports that feelings of being "tired" have improved. He he states he thinks Abilify was causing above side effects and that he is feeling better since he decided not to continue taking said medication. Currently describes gradually improving mood.  Denies suicidal ideations at this time.   Objective: I have reviewed chart notes and have met with patient. 52 year old male, lives with GF, employed, admitted for depression, suicidal ideations. He has a past history of opiate ( heroin), and cocaine abuse .States he had not been using regularly recently. He has history of prior psychiatric admission for depression and self cutting. Today reports feeling better, and describes improving mood.  Does present with a fuller range of affect.  Yesterday had reported feeling vaguely dizzy and subjectively tired.  Medication adjustments were made.  Of note, patient attributes symptoms to Abilify and states he does not feel he was tolerating this medication well.  He states he does not want to continue Abilify trial at this time.  Behavior on unit in good control, pleasant on approach.  He has been going to groups, visible in milieu. Labs reviewed-creatinine trending down, currently 1.7, GFR improved to 52.  Hemoglobin improved to 10.9.  Hematocrit improved to 33.8, serum glucose 93.     Principal Problem: Major depressive disorder, recurrent episode, severe ([employer]) Diagnosis:   Patient Active Problem List   Diagnosis Date Noted  . Polysubstance dependence including opioid drug with daily use ([place]) [F19.20] 11/14/2017  . Suicidal ideation [R45.851] 11/13/2017  . Anxiety and depression [F41.9, F32.9]   . Acute renal failure ([place]) [N17.9]   . Altered mental status [R41.82]   . Cocaine use disorder ([place]) [F14.10]   . AKI (acute kidney injury) ([place]) [N17.9] 11/07/2017  .  Leukocytosis [D72.829] 11/07/2017  . Chronic kidney disease (CKD), stage III (moderate) ([place]) [N18.3] 09/19/2016  . Elevated LFTs [R94.5] 09/19/2016  . Major depressive disorder, recurrent episode, severe ([place]) [F33.2] 08/24/2015  . Insomnia [G47.00]   . MDD (major depressive disorder), single episode, severe , no psychosis ([place]) [F32.2] 08/14/2015  . Cannabis use disorder, mild, abuse [F12.10] 08/14/2015  . Cocaine abuse ([place]) [F14.10] 08/10/2015  . Weight loss, non-intentional [R63.4] 08/10/2015  . Depression [F32.9] 08/10/2015  . Essential hypertension [I10]   . Vaccine refused by patient [Z28.20] 05/24/2015  . Routine general medical examination at a health care facility [Z00.00] 05/24/2015  . Chronic back pain [M54.9, G89.29] 05/24/2015  . Family history of cancer [Z80.9] 05/24/2015  . Screening for prostate cancer [Z12.5] 05/24/2015  . Smoker [F17.200] 05/24/2015  . Screen for colon cancer [Z12.11] 05/24/2015  . Erectile dysfunction [N52.9] 01/31/2014   Total Time spent with patient: 20 minutes  Past Psychiatric History: see H&P  Past Medical History:  Past Medical History:  Diagnosis Date  . Chronic back pain    managed [facility]  . CKD (chronic kidney disease), stage III ([place]) 10/2017  . Erectile dysfunction   . History of substance abuse   . Hx of suicide attempt   . Hypertension     Past Surgical History:  Procedure Laterality Date  . ANKLE SURGERY     right  . APPLICATION OF WOUND VAC Right 08/17/2015   Procedure: APPLICATION OF WOUND VAC;  Surgeon: Roseanne Kaufman, MD;  Location: [place];  Service: Orthopedics;  Laterality: Right;  . HAND SURGERY  fracture, ORIG, teenage years, right.  . I&D EXTREMITY Right 08/10/2015   Procedure: IRRIGATION AND DEBRIDEMENT SKIN, SUBCUTANEOS TISSUE AND  MUSCLE, CARPAL TUNNEL RELEASE, MEDIAN NERVE LYSIS WITH NERVE WRAP;  Surgeon: Roseanne Kaufman, MD;  Location: [place];  Service: Orthopedics;  Laterality: Right;   . INCISION AND DRAINAGE OF WOUND Right 08/15/2015   Procedure: IRRIGATION AND DEBRIDEMENT WOUND;  Surgeon: Justice Britain, MD;  Location: [facility];  Service: Orthopedics;  Laterality: Right;  . LUMBAR EPIDURAL INJECTION     [facility]  . SKIN SPLIT GRAFT Right 08/17/2015   Procedure: I&D RIGHT FOREARM/POSSIBLE SKIN GRAFT;  Surgeon: Roseanne Kaufman, MD;  Location: [place];  Service: Orthopedics;  Laterality: Right;  . TENDON REPAIR N/A 07/24/2015   Procedure: WRIST LACERATION AND TENDON REPAIR;  Surgeon: Iran Planas, MD;  Location: [facility];  Service: Orthopedics;  Laterality: N/A;  . WOUND EXPLORATION Right 08/15/2015   Procedure: WOUND EXPLORATION right forearm;  Surgeon: Justice Britain, MD;  Location: [facility];  Service: Orthopedics;  Laterality: Right;   Family History:  Family History  Problem Relation Age of Onset  . Cancer Mother        lung  . Anxiety disorder Mother   . Cancer Father        throat  . Diabetes Paternal Grandmother   . Cancer Paternal Aunt   . Heart disease Neg Hx   . Stroke Neg Hx    Family Psychiatric  History: see H&P Social History:  Social History   Substance and Sexual Activity  Alcohol Use Yes  . Alcohol/week: 0.0 standard drinks   Comment: occasional     Social History   Substance and Sexual Activity  Drug Use Yes  . Types: Marijuana, Cocaine, Heroin   Comment: last use cocaine 08/08/15, last use MJ 08/08/15    Social History   Socioeconomic History  . Marital status: Single    Spouse name: Not on file  . Number of children: Not on file  . Years of education: Not on file  . Highest education level: Not on file  Occupational History  . Not on file  Social Needs  . Financial resource strain: Not on file  . Food insecurity:    Worry: Not on file    Inability: Not on file  . Transportation needs:    Medical: Not on file    Non-medical: Not on file  Tobacco Use  . Smoking status: Current Every Day Smoker    Packs/day: 0.50     Years: 6.00    Pack years: 3.00    Types: Cigarettes  . Smokeless tobacco: Never Used  Substance and Sexual Activity  . Alcohol use: Yes    Alcohol/week: 0.0 standard drinks    Comment: occasional  . Drug use: Yes    Types: Marijuana, Cocaine, Heroin    Comment: last use cocaine 08/08/15, last use MJ 08/08/15  . Sexual activity: Yes  Lifestyle  . Physical activity:    Days per week: Not on file    Minutes per session: Not on file  . Stress: Not on file  Relationships  . Social connections:    Talks on phone: Not on file    Gets together: Not on file    Attends religious service: Not on file    Active member of club or organization: Not on file    Attends meetings of clubs or organizations: Not on file    Relationship status: Not on file  Other  Topics Concern  . Not on file  Social History Narrative   Single,prior worked as Geophysicist/field seismologist at [employer].   No children.  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   09/2017.    Additional Social History:   Sleep: Fair  Appetite:  Good  Current Medications: Current Facility-Administered Medications  Medication Dose Route Frequency Provider Last Rate Last Dose  . amLODipine (NORVASC) tablet 5 mg  5 mg Oral Daily Rankin, Shuvon B, NP   5 mg at 11/18/17 0900  . ferrous sulfate tablet 325 mg  325 mg Oral Q breakfast Rankin, Shuvon B, NP   325 mg at 11/18/17 0900  . folic acid (FOLVITE) tablet 1 mg  1 mg Oral Daily Rankin, Shuvon B, NP   1 mg at 11/18/17 0900  . gabapentin (NEURONTIN) capsule 100 mg  100 mg Oral TID Cobos, Myer Peer, MD   100 mg at 11/18/17 1301  . HYDROcodone-acetaminophen (NORCO) 10-325 MG per tablet 1 tablet  1 tablet Oral Q4H PRN Rankin, Shuvon B, NP   1 tablet at 11/18/17 1301  . hydrOXYzine (ATARAX/VISTARIL) tablet 25 mg  25 mg Oral Q6H PRN Cobos, Myer Peer, MD      . loratadine (CLARITIN) tablet 10 mg  10 mg Oral Daily PRN Rankin, Shuvon B, NP      . naphazoline-pheniramine  (NAPHCON-A) 0.025-0.3 % ophthalmic solution 1 drop  1 drop Both Eyes QID PRN Rankin, Shuvon B, NP      . nicotine (NICODERM CQ - dosed in mg/24 hours) patch 21 mg  21 mg Transdermal Daily PRN Rankin, Shuvon B, NP      . polyethylene glycol (MIRALAX / GLYCOLAX) packet 17 g  17 g Oral BID Rankin, Shuvon B, NP      . senna-docusate (Senokot-S) tablet 1 tablet  1 tablet Oral BID Rankin, Shuvon B, NP      . thiamine (VITAMIN B-1) tablet 100 mg  100 mg Oral Daily Rankin, Shuvon B, NP   100 mg at 11/18/17 0900  . traZODone (DESYREL) tablet 50 mg  50 mg Oral QHS PRN Rozetta Nunnery, NP      . vortioxetine HBr (TRINTELLIX) tablet 10 mg  10 mg Oral Daily Cobos, Myer Peer, MD   10 mg at 11/18/17 1301    Lab Results:  Results for orders placed or performed during the hospital encounter of 11/13/17 (from the past 48 hour(s))  CBC with Differential/Platelet     Status: Abnormal   Collection Time: 11/18/17  6:21 AM  Result Value Ref Range   WBC 5.6 4.0 - 10.5 K/uL   RBC 4.18 (L) 4.22 - 5.81 MIL/uL   Hemoglobin 10.9 (L) 13.0 - 17.0 g/dL   HCT 33.8 (L) 39.0 - 52.0 %   MCV 80.9 78.0 - 100.0 fL   MCH 26.1 26.0 - 34.0 pg   MCHC 32.2 30.0 - 36.0 g/dL   RDW 17.5 (H) 11.5 - 15.5 %   Platelets 294 150 - 400 K/uL   Neutrophils Relative % 50 %   Neutro Abs 2.8 1.7 - 7.7 K/uL   Lymphocytes Relative 39 %   Lymphs Abs 2.2 0.7 - 4.0 K/uL   Monocytes Relative 9 %   Monocytes Absolute 0.5 0.1 - 1.0 K/uL   Eosinophils Relative 2 %   Eosinophils Absolute 0.1 0.0 - 0.7 K/uL   Basophils Relative 0 %   Basophils Absolute 0.0 0.0 - 0.1 K/uL    Comment: Performed at  [facility], [place] [address] [place], [place] [address]  Basic metabolic panel     Status: Abnormal   Collection Time: 11/18/17  6:21 AM  Result Value Ref Range   Sodium 143 135 - 145 mmol/L   Potassium 3.8 3.5 - 5.1 mmol/L   Chloride 107 98 - 111 mmol/L   CO2 27 22 - 32 mmol/L   Glucose, Bld 93 70 - 99 mg/dL   BUN 12 6 - 20 mg/dL    Creatinine, Ser 1.70 (H) 0.61 - 1.24 mg/dL   Calcium 9.3 8.9 - 10.3 mg/dL   GFR calc non Af Amer 45 (L) >60 mL/min   GFR calc Af Amer 52 (L) >60 mL/min    Comment: (NOTE) The eGFR has been calculated using the CKD EPI equation. This calculation has not been validated in all clinical situations. eGFR's persistently <60 mL/min signify possible Chronic Kidney Disease.    Anion gap 9 5 - 15    Comment: Performed at [facility], [place] [address], [place], [place] [address]    Blood Alcohol level:  Lab Results  Component Value Date   ETH <10 11/07/2017   ETH <5 60/12/9321    Metabolic Disorder Labs: Lab Results  Component Value Date   HGBA1C 5.6 08/31/2016   MPG 114 08/31/2016   MPG 120 (H) 05/24/2015   No results found for: PROLACTIN Lab Results  Component Value Date   CHOL 254 (H) 10/08/2017   TRIG 183 (H) 10/08/2017   HDL 58 10/08/2017   CHOLHDL 4.4 10/08/2017   VLDL 33 (H) 08/31/2016   LDLCALC 159 (H) 10/08/2017   LDLCALC 129 (H) 08/31/2016    Physical Findings: AIMS: Facial and Oral Movements Muscles of Facial Expression: None, normal Lips and Perioral Area: None, normal Jaw: None, normal Tongue: None, normal,Extremity Movements Upper (arms, wrists, hands, fingers): None, normal Lower (legs, knees, ankles, toes): None, normal, Trunk Movements Neck, shoulders, hips: None, normal, Overall Severity Severity of abnormal movements (highest score from questions above): None, normal Incapacitation due to abnormal movements: None, normal Patient's awareness of abnormal movements (rate only patient's report): No Awareness, Dental Status Current problems with teeth and/or dentures?: No Does patient usually wear dentures?: No  CIWA:  CIWA-Ar Total: 1 COWS:  COWS Total Score: 1  Musculoskeletal: Strength & Muscle Tone: within normal limits Gait & Station: normal Patient leans: N/A  Psychiatric Specialty Exam: Physical Exam  Nursing note and  vitals reviewed.   Review of Systems  Constitutional: Negative for chills and fever.  Respiratory: Negative for cough and shortness of breath.   Cardiovascular: Negative for chest pain.  Gastrointestinal: Negative for abdominal pain, heartburn, nausea and vomiting.  Psychiatric/Behavioral: Negative for depression, hallucinations and suicidal ideas. The patient is nervous/anxious. The patient does not have insomnia.   Today denies dizziness or lightheadedness, no chest pain, no shortness of breath, no vomiting  Blood pressure (!) 130/94, pulse 68, temperature 98 F (36.7 C), resp. rate 16, height 6' 2"  (1.88 m), weight 99.8 kg.Body mass index is 28.25 kg/m.  General Appearance: Well Groomed  Eye Contact:  Good  Speech:  Normal Rate  Volume:  Normal  Mood:  Partially improved mood  Affect:  Seems improved today, smiles at times appropriately, becoming more reactive  Thought Process:  Linear and Descriptions of Associations: Intact  Orientation:  Other:  he is alert, attentive and fully oriented x 3   Thought Content:  no hallucinations, no delusions   Suicidal Thoughts:  No denies any suicidal plan or intention, contracts for safety   Homicidal Thoughts:  No denies any homicidal or violent ideations  Memory:  recent and remote grossly intact   Judgement: Improving  Insight:  Fair/improving  Psychomotor Activity:  Normal  Concentration:  Concentration: Good and Attention Span: Good  Recall:  Good  Fund of Knowledge:  Good  Language:  Good  Akathisia:  No  Handed:    AIMS (if indicated):     Assets:  Physical Health Resilience  ADL's:  Intact  Cognition:  WNL  Sleep:  Number of Hours: 5    Assessment - 52 year old male, history of depression, prior suicide attempt by self cutting , anxiety,  substance abuse, who was admitted due to worsening depression and suicidal ideations.  At this time presents with partially improved mood and fuller range of affect, states he feels better.   Currently denies suicidal ideations.  He had reported vague dizziness/lightheadedness/sedation-the symptoms now improved/resolved which she attributes to stopping Abilify. We discussed medication options-patient states he remembers a Trintellix trial as effective and well-tolerated and states "I think I went downhill after I stopped it"/ he does not remember having had side effects on this medication.   Treatment Plan Summary: Treatment Plan reviewed as below today 8/25 Daily contact with patient to assess and evaluate symptoms and progress in treatment and Medication management  Encourage group and milieu participation to work on coping skills and symptom reduction Discontinue Abilify/see above Continue Neurontin  100 mgrs TID for anxiety , pain  Continue Vistaril  25 mgrs Q 6 hours PRN for anxiety Continue Norvasc 72m QDAY for HTN Start Trintellix 10 mg daily for depression and anxiety Continue Ferrous Sulfate 3226mQDAY for anemia Continue Naphcon-A 1 drop both eyes QID prn eye irritation. Continue Norco 10-32544m tablet q4h prn severe pain (resuming home regimen) Treatment team working on disposition planning options   FerJenne CampusD 11/18/2017, 2:43 PM   Patient ID: JohMitchell Heirale   DOB: 5/3Feb 07, 19670 4o.   MRN: [id number]

## 2017-11-18 NOTE — [facility] Group Notes (Signed)
[facility] LCSW Group Therapy Note  Date/Time:  11/18/2017 10:00-11:00AM  Type of Therapy and Topic:  Group Therapy:  Healthy and Unhealthy Supports  Participation Level:  Did Not Attend   Description of Group:  Patients in this group were introduced to the idea of adding a variety of healthy supports to address the various needs in their lives.Patients discussed what additional healthy supports could be helpful in their recovery and wellness after discharge in order to prevent future hospitalizations.   An emphasis was placed on using counselor, doctor, therapy groups, 12-step groups, and problem-specific support groups to expand supports.  They also worked as a group on developing a specific plan for several patients to deal with unhealthy supports through boundary-setting, psychoeducation with loved ones, and even termination of relationships.   Therapeutic Goals:   1)  discuss importance of adding supports to stay well once out of the hospital  2)  compare healthy versus unhealthy supports and identify some examples of each  3)  generate ideas and descriptions of healthy supports that can be added  4)  offer mutual support about how to address unhealthy supports  5)  encourage active participation in and adherence to discharge plan    Summary of Patient Progress:   Did not attend   Therapeutic Modalities:   Motivational Interviewing Brief Solution-Focused Therapy  Evorn Gongonnie D Toluwani Yadav

## 2017-11-19 ENCOUNTER — Telehealth: Payer: Self-pay

## 2017-11-19 ENCOUNTER — Encounter (HOSPITAL_COMMUNITY): Payer: Self-pay | Admitting: Behavioral Health

## 2017-11-19 ENCOUNTER — Inpatient Hospital Stay: Payer: BLUE CROSS/BLUE SHIELD | Admitting: Medical

## 2017-11-19 NOTE — Progress Notes (Signed)
Pt is observed in the dayroom, seen watching TV. Pt attended AA meeting. Pt appears flat/anxious in affect and mood. Pt denies SI/HI/AVH at this time Rates pain 8/10; back. Pt states he will work on building relationships of people he care about in his life. PRN Norco and trazodone requested and given. Will continue with POC.

## 2017-11-19 NOTE — Progress Notes (Signed)
Attended group 

## 2017-11-19 NOTE — Telephone Encounter (Signed)
Chrystine (fiance) called stating Jaysion relapsed and was brought into hospital due to being found un responsive on November 07, 2017. He was given a IV flush, 3 blood transfusions because Hemoglobin dropped to 6.6. August 20,2019 he was released to behavioral health, hemoglobin was 8.8. This all started with NP whom prescribed multiple psych meds in one span. He had a psychotic episode due to the changes in different medications. Now patient was suppose to follow up to check hemaoglobin and have stitches removed due to laceration on his arm (not self induced) to pt knowledge but he was feeling suicidal. Follow up should have been with main hospital so patient could be transported because he will eventually be moved to a long term facility. If the list of medication is needed that the patient was taken over the month of July-August Chrystine Canino (patient fiance) said she can bring it by. [facility]  Fiance stated patient did not overdose but he was dehydrated. Patient had taken Heroin, Cocaine, Tramadol, alcohol and possibly some other things. She stated he left the home Saturday November 03, 2017 and fiance had no idea where he was. She found him at the hospital on Wednesday November 07, 2017. [facility] once [facility] was found unresponsive.  Chrystine can be reached at the following numbers: [phone]  CHRYSTINE IS NOT CURRENTLY ON HIPPA FORM. INFORMATION WAS RECEIVED BUT NONE WAS GIVEN.

## 2017-11-19 NOTE — Progress Notes (Signed)
Patient ID: Benjamin HaagensenJohn [name], male   DOB: 1965/09/15, 52 y.o.   MRN: [id number]  D: Patient denies SI/HI and auditory and visual hallucinations. Patient has a depressed mood and affect. Patent stated that when he wakes up sometimes he has SI but it goes away. His mood has improved since he has been here.  A: Patient given emotional support from RN. Patient given medications per MD orders. Patient encouraged to attend groups and unit activities. Patient encouraged to come to staff with any questions or concerns.  R: Patient remains cooperative and appropriate. Will continue to monitor patient for safety.

## 2017-11-19 NOTE — [facility] Group Notes (Signed)
LCSW Group Therapy Note   11/19/2017 1:15pm   Type of Therapy and Topic:  Group Therapy:  Overcoming Obstacles   Participation Level:  Active   Description of Group:    In this group patients will be encouraged to explore what they see as obstacles to their own wellness and recovery. They will be guided to discuss their thoughts, feelings, and behaviors related to these obstacles. The group will process together ways to cope with barriers, with attention given to specific choices patients can make. Each patient will be challenged to identify changes they are motivated to make in order to overcome their obstacles. This group will be process-oriented, with patients participating in exploration of their own experiences as well as giving and receiving support and challenge from other group members.   Therapeutic Goals: 1. Patient will identify personal and current obstacles as they relate to admission. 2. Patient will identify barriers that currently interfere with their wellness or overcoming obstacles.  3. Patient will identify feelings, thought process and behaviors related to these barriers. 4. Patient will identify two changes they are willing to make to overcome these obstacles:      Summary of Patient Progress Benjamin RuizJohn was attentive and engaged. He was relieved to learn that he has been accepted for residential treatment but worries that he will have to get stiches removed and hemoglobin level rechecked by his outpatient provider first. CSW continuing to assess.      Therapeutic Modalities:   Cognitive Behavioral Therapy Solution Focused Therapy Motivational Interviewing Relapse Prevention Therapy  Rona RavensHeather S Brinkley Peet, LCSW 11/19/2017 3:04 PM

## 2017-11-19 NOTE — Progress Notes (Addendum)
[facility] MD Progress Note  11/19/2017 9:43 AM Benjamin [name]  MRN:  [id number] Subjective: Patient states," I am feeling better today compared to how I have felt when I first got here. I had some of the thoughts when I woke up and that's when they normally happen but I am ok now. Just thinking about this process makes me nervous."   Objective: face to face evaluation completed, case discussed with treatment team and chart reviewed. This is a 52 year old male, lives with GF, employed, admitted for depression, suicidal ideations. He has a past history of opiate ( heroin), and cocaine abuse although states he had not been using regularly recently. He has history of prior psychiatric admission for depression and self cutting.  Today patient is alert and oriented x4, calm and cooperative. He continues to endorse slow improvement in mood. He endorse some anxiety although none in excessive that would interfere with daily functioning. He denies SI although reports thoughts are intermittent. He does not have a plan or intent to harm himself. Denies HI or AVH and no psychosis is observed.  He is complaint with medications and today denies any intolerances or medication related side effects. He denies stoners with appetite or resting pattern. He presents without any behavioral issues on the unit. He continues to actively participate in unit milieu. He is contracting for safety on the unit. Remains open to follow-up treatment following discharge.      Principal Problem: Major depressive disorder, recurrent episode, severe ([place]) Diagnosis:   Patient Active Problem List   Diagnosis Date Noted  . Polysubstance dependence including opioid drug with daily use ([place]) [F19.20] 11/14/2017  . Suicidal ideation [R45.851] 11/13/2017  . Anxiety and depression [F41.9, F32.9]   . Acute renal failure ([place]) [N17.9]   . Altered mental status [R41.82]   . Cocaine use disorder ([place]) [F14.10]   . AKI (acute kidney injury) ([place])  [N17.9] 11/07/2017  . Leukocytosis [D72.829] 11/07/2017  . Chronic kidney disease (CKD), stage III (moderate) ([place]) [N18.3] 09/19/2016  . Elevated LFTs [R94.5] 09/19/2016  . Major depressive disorder, recurrent episode, severe ([place]) [F33.2] 08/24/2015  . Insomnia [G47.00]   . MDD (major depressive disorder), single episode, severe , no psychosis ([place]) [F32.2] 08/14/2015  . Cannabis use disorder, mild, abuse [F12.10] 08/14/2015  . Cocaine abuse ([place]) [F14.10] 08/10/2015  . Weight loss, non-intentional [R63.4] 08/10/2015  . Depression [F32.9] 08/10/2015  . Essential hypertension [I10]   . Vaccine refused by patient [Z28.20] 05/24/2015  . Routine general medical examination at a health care facility [Z00.00] 05/24/2015  . Chronic back pain [M54.9, G89.29] 05/24/2015  . Family history of cancer [Z80.9] 05/24/2015  . Screening for prostate cancer [Z12.5] 05/24/2015  . Smoker [F17.200] 05/24/2015  . Screen for colon cancer [Z12.11] 05/24/2015  . Erectile dysfunction [N52.9] 01/31/2014   Total Time spent with patient: 20 minutes  Past Psychiatric History: see H&P  Past Medical History:  Past Medical History:  Diagnosis Date  . Chronic back pain    [facility]  . CKD (chronic kidney disease), stage III ([place]) 10/2017  . Erectile dysfunction   . History of substance abuse   . Hx of suicide attempt   . Hypertension     Past Surgical History:  Procedure Laterality Date  . ANKLE SURGERY     right  . APPLICATION OF WOUND VAC Right 08/17/2015   Procedure: APPLICATION OF WOUND VAC;  Surgeon: Roseanne Kaufman, MD;  Location: [facility];  Service:  Orthopedics;  Laterality: Right;  . HAND SURGERY     fracture, ORIG, teenage years, right.  . I&D EXTREMITY Right 08/10/2015   Procedure: IRRIGATION AND DEBRIDEMENT SKIN, SUBCUTANEOS TISSUE AND  MUSCLE, CARPAL TUNNEL RELEASE, MEDIAN NERVE LYSIS WITH NERVE WRAP;  Surgeon: Roseanne Kaufman, MD;  Location: [facility];  Service:  Orthopedics;  Laterality: Right;  . INCISION AND DRAINAGE OF WOUND Right 08/15/2015   Procedure: IRRIGATION AND DEBRIDEMENT WOUND;  Surgeon: Justice Britain, MD;  Location: [facility];  Service: Orthopedics;  Laterality: Right;  . LUMBAR EPIDURAL INJECTION     [facility]  . SKIN SPLIT GRAFT Right 08/17/2015   Procedure: I&D RIGHT FOREARM/POSSIBLE SKIN GRAFT;  Surgeon: Roseanne Kaufman, MD;  Location: [facility];  Service: Orthopedics;  Laterality: Right;  . TENDON REPAIR N/A 07/24/2015   Procedure: WRIST LACERATION AND TENDON REPAIR;  Surgeon: Iran Planas, MD;  Location: [facility];  Service: Orthopedics;  Laterality: N/A;  . WOUND EXPLORATION Right 08/15/2015   Procedure: WOUND EXPLORATION right forearm;  Surgeon: Justice Britain, MD;  Location: [facility];  Service: Orthopedics;  Laterality: Right;   Family History:  Family History  Problem Relation Age of Onset  . Cancer Mother        lung  . Anxiety disorder Mother   . Cancer Father        throat  . Diabetes Paternal Grandmother   . Cancer Paternal Aunt   . Heart disease Neg Hx   . Stroke Neg Hx    Family Psychiatric  History: see H&P Social History:  Social History   Substance and Sexual Activity  Alcohol Use Yes  . Alcohol/week: 0.0 standard drinks   Comment: occasional     Social History   Substance and Sexual Activity  Drug Use Yes  . Types: Marijuana, Cocaine, Heroin   Comment: last use cocaine 08/08/15, last use MJ 08/08/15    Social History   Socioeconomic History  . Marital status: Single    Spouse name: Not on file  . Number of children: Not on file  . Years of education: Not on file  . Highest education level: Not on file  Occupational History  . Not on file  Social Needs  . Financial resource strain: Not on file  . Food insecurity:    Worry: Not on file    Inability: Not on file  . Transportation needs:    Medical: Not on file    Non-medical: Not on file  Tobacco Use  . Smoking status: Current Every Day  Smoker    Packs/day: 0.50    Years: 6.00    Pack years: 3.00    Types: Cigarettes  . Smokeless tobacco: Never Used  Substance and Sexual Activity  . Alcohol use: Yes    Alcohol/week: 0.0 standard drinks    Comment: occasional  . Drug use: Yes    Types: Marijuana, Cocaine, Heroin    Comment: last use cocaine 08/08/15, last use MJ 08/08/15  . Sexual activity: Yes  Lifestyle  . Physical activity:    Days per week: Not on file    Minutes per session: Not on file  . Stress: Not on file  Relationships  . Social connections:    Talks on phone: Not on file    Gets together: Not on file    Attends religious service: Not on file    Active member of club or organization: Not on file    Attends meetings of clubs or organizations: Not  on file    Relationship status: Not on file  Other Topics Concern  . Not on file  Social History Narrative   Single,prior worked as Geophysicist/field seismologist at [employer].   No children.  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   09/2017.    Additional Social History:   Sleep: Fair  Appetite:  Good  Current Medications: Current Facility-Administered Medications  Medication Dose Route Frequency Provider Last Rate Last Dose  . amLODipine (NORVASC) tablet 5 mg  5 mg Oral Daily Rankin, Shuvon B, NP   5 mg at 11/19/17 0800  . ferrous sulfate tablet 325 mg  325 mg Oral Q breakfast Rankin, Shuvon B, NP   325 mg at 11/19/17 0800  . folic acid (FOLVITE) tablet 1 mg  1 mg Oral Daily Rankin, Shuvon B, NP   1 mg at 11/19/17 0800  . gabapentin (NEURONTIN) capsule 100 mg  100 mg Oral TID Cobos, Myer Peer, MD   100 mg at 11/19/17 0800  . HYDROcodone-acetaminophen (NORCO) 10-325 MG per tablet 1 tablet  1 tablet Oral Q4H PRN Rankin, Shuvon B, NP   1 tablet at 11/19/17 2951  . hydrOXYzine (ATARAX/VISTARIL) tablet 25 mg  25 mg Oral Q6H PRN Cobos, Myer Peer, MD      . loratadine (CLARITIN) tablet 10 mg  10 mg Oral Daily PRN Rankin, Shuvon B, NP      .  naphazoline-pheniramine (NAPHCON-A) 0.025-0.3 % ophthalmic solution 1 drop  1 drop Both Eyes QID PRN Rankin, Shuvon B, NP      . nicotine (NICODERM CQ - dosed in mg/24 hours) patch 21 mg  21 mg Transdermal Daily PRN Rankin, Shuvon B, NP      . polyethylene glycol (MIRALAX / GLYCOLAX) packet 17 g  17 g Oral BID Rankin, Shuvon B, NP      . senna-docusate (Senokot-S) tablet 1 tablet  1 tablet Oral BID Rankin, Shuvon B, NP      . thiamine (VITAMIN B-1) tablet 100 mg  100 mg Oral Daily Rankin, Shuvon B, NP   100 mg at 11/19/17 0759  . traZODone (DESYREL) tablet 50 mg  50 mg Oral QHS PRN Lindon Romp A, NP   50 mg at 11/18/17 2101  . vortioxetine HBr (TRINTELLIX) tablet 10 mg  10 mg Oral Daily Cobos, Myer Peer, MD   10 mg at 11/19/17 0800    Lab Results:  Results for orders placed or performed during the hospital encounter of 11/13/17 (from the past 48 hour(s))  CBC with Differential/Platelet     Status: Abnormal   Collection Time: 11/18/17  6:21 AM  Result Value Ref Range   WBC 5.6 4.0 - 10.5 K/uL   RBC 4.18 (L) 4.22 - 5.81 MIL/uL   Hemoglobin 10.9 (L) 13.0 - 17.0 g/dL   HCT 33.8 (L) 39.0 - 52.0 %   MCV 80.9 78.0 - 100.0 fL   MCH 26.1 26.0 - 34.0 pg   MCHC 32.2 30.0 - 36.0 g/dL   RDW 17.5 (H) 11.5 - 15.5 %   Platelets 294 150 - 400 K/uL   Neutrophils Relative % 50 %   Neutro Abs 2.8 1.7 - 7.7 K/uL   Lymphocytes Relative 39 %   Lymphs Abs 2.2 0.7 - 4.0 K/uL   Monocytes Relative 9 %   Monocytes Absolute 0.5 0.1 - 1.0 K/uL   Eosinophils Relative 2 %   Eosinophils Absolute 0.1 0.0 - 0.7 K/uL   Basophils Relative 0 %  Basophils Absolute 0.0 0.0 - 0.1 K/uL    Comment: Performed at [facility], [place] [address], [place], [place] [address]  Basic metabolic panel     Status: Abnormal   Collection Time: 11/18/17  6:21 AM  Result Value Ref Range   Sodium 143 135 - 145 mmol/L   Potassium 3.8 3.5 - 5.1 mmol/L   Chloride 107 98 - 111 mmol/L   CO2 27 22 - 32 mmol/L   Glucose,  Bld 93 70 - 99 mg/dL   BUN 12 6 - 20 mg/dL   Creatinine, Ser 1.70 (H) 0.61 - 1.24 mg/dL   Calcium 9.3 8.9 - 10.3 mg/dL   GFR calc non Af Amer 45 (L) >60 mL/min   GFR calc Af Amer 52 (L) >60 mL/min    Comment: (NOTE) The eGFR has been calculated using the CKD EPI equation. This calculation has not been validated in all clinical situations. eGFR's persistently <60 mL/min signify possible Chronic Kidney Disease.    Anion gap 9 5 - 15    Comment: Performed at [facility], [place] [address], [place], [place] [address]    Blood Alcohol level:  Lab Results  Component Value Date   ETH <10 11/07/2017   ETH <5 38/45/3646    Metabolic Disorder Labs: Lab Results  Component Value Date   HGBA1C 5.6 08/31/2016   MPG 114 08/31/2016   MPG 120 (H) 05/24/2015   No results found for: PROLACTIN Lab Results  Component Value Date   CHOL 254 (H) 10/08/2017   TRIG 183 (H) 10/08/2017   HDL 58 10/08/2017   CHOLHDL 4.4 10/08/2017   VLDL 33 (H) 08/31/2016   LDLCALC 159 (H) 10/08/2017   LDLCALC 129 (H) 08/31/2016    Physical Findings: AIMS: Facial and Oral Movements Muscles of Facial Expression: None, normal Lips and Perioral Area: None, normal Jaw: None, normal Tongue: None, normal,Extremity Movements Upper (arms, wrists, hands, fingers): None, normal Lower (legs, knees, ankles, toes): None, normal, Trunk Movements Neck, shoulders, hips: None, normal, Overall Severity Severity of abnormal movements (highest score from questions above): None, normal Incapacitation due to abnormal movements: None, normal Patient's awareness of abnormal movements (rate only patient's report): No Awareness, Dental Status Current problems with teeth and/or dentures?: No Does patient usually wear dentures?: No  CIWA:  CIWA-Ar Total: 1 COWS:  COWS Total Score: 1  Musculoskeletal: Strength & Muscle Tone: within normal limits Gait & Station: normal Patient leans: N/A  Psychiatric Specialty  Exam: Physical Exam  Nursing note and vitals reviewed. Constitutional: He is oriented to person, place, and time.  Neurological: He is alert and oriented to person, place, and time.    Review of Systems  Constitutional: Negative for chills and fever.  Respiratory: Negative for cough and shortness of breath.   Cardiovascular: Negative for chest pain.  Gastrointestinal: Negative for abdominal pain, heartburn, nausea and vomiting.  Psychiatric/Behavioral: Positive for depression. Negative for hallucinations and suicidal ideas. The patient is nervous/anxious. The patient does not have insomnia.   Today denies dizziness or lightheadedness, no chest pain, no shortness of breath, no vomiting  Blood pressure (!) 145/103, pulse 73, temperature 97.9 F (36.6 C), temperature source Oral, resp. rate (!) 24, height _0  (1.88 m), weight 99.8 kg.Body mass index is 28.25 kg/m.  General Appearance: Well Groomed  Eye Contact:  Good  Speech:  Normal Rate  Volume:  Normal  Mood:  Partially improved mood  Affect:  Appropriate  Thought Process:  Linear  and Descriptions of Associations: Intact  Orientation:  Other:  he is alert, attentive and fully oriented x 3   Thought Content:  no hallucinations, no delusions   Suicidal Thoughts:  No denies any suicidal plan or intention, contracts for safety   Homicidal Thoughts:  No denies any homicidal or violent ideations  Memory:  recent and remote grossly intact   Judgement: Improving  Insight:  Fair/improving  Psychomotor Activity:  Normal  Concentration:  Concentration: Good and Attention Span: Good  Recall:  Good  Fund of Knowledge:  Good  Language:  Good  Akathisia:  No  Handed:    AIMS (if indicated):     Assets:  Physical Health Resilience  ADL's:  Intact  Cognition:  WNL  Sleep:  Number of Hours: 6.25    Assessment - 52 year old male, history of depression, prior suicide attempt by self cutting , anxiety,  substance abuse, who was admitted due  to worsening depression and suicidal ideations.  At this time patient continues to present with slow improvement in mood..  Currently denies suicidal ideations.  Denies medication related side effects or somatic complaints.   Treatment Plan reviewed as below 11/19/2017. Will continue the following plan without adjustments at this time.  Daily contact with patient to assess and evaluate symptoms and progress in treatment and Medication management  Encourage group and milieu participation to work on coping skills and symptom reduction Continue Neurontin  100 mgrs TID for anxiety , pain  Continue Vistaril  25 mgrs Q 6 hours PRN for anxiety Continue Norvasc 4m QDAY for HTN Continue Trintellix 10 mg daily for depression and anxiety Continue Ferrous Sulfate 3287mQDAY for anemia Continue Naphcon-A 1 drop both eyes QID prn eye irritation. Continue Norco 10-32578m tablet q4h prn severe pain (resuming home regimen) Treatment team working on disposition planning options   LaSMordecai MaesP 11/19/2017, 9:43 AM   Patient ID: JohMitchell Heirale   DOB: [birth year] 20o.   MRN: [id number] have reviewed the note by NP, and I am in agreement with the assessment and plan.

## 2017-11-19 NOTE — Tx Team (Signed)
Interdisciplinary Treatment and Diagnostic Plan Update  11/19/2017 Time of Session: 0830AM Benjamin [name] MRN: [id number]  Principal Diagnosis: MDD, recurrent, severe  Secondary Diagnoses: Principal Problem:   Major depressive disorder, recurrent episode, severe (HCC) Active Problems:   Routine general medical examination at a health care facility   Polysubstance dependence including opioid drug with daily use ([facility])   Current Medications:  Current Facility-Administered Medications  Medication Dose Route Frequency Provider Last Rate Last Dose  . amLODipine (NORVASC) tablet 5 mg  5 mg Oral Daily Rankin, Shuvon B, NP   5 mg at 11/19/17 0800  . ferrous sulfate tablet 325 mg  325 mg Oral Q breakfast Rankin, Shuvon B, NP   325 mg at 11/19/17 0800  . folic acid (FOLVITE) tablet 1 mg  1 mg Oral Daily Rankin, Shuvon B, NP   1 mg at 11/19/17 0800  . gabapentin (NEURONTIN) capsule 100 mg  100 mg Oral TID Cobos, Rockey Situ, MD   100 mg at 11/19/17 0800  . HYDROcodone-acetaminophen (NORCO) 10-325 MG per tablet 1 tablet  1 tablet Oral Q4H PRN Rankin, Shuvon B, NP   1 tablet at 11/19/17 4098  . hydrOXYzine (ATARAX/VISTARIL) tablet 25 mg  25 mg Oral Q6H PRN Cobos, Rockey Situ, MD      . loratadine (CLARITIN) tablet 10 mg  10 mg Oral Daily PRN Rankin, Shuvon B, NP      . naphazoline-pheniramine (NAPHCON-A) 0.025-0.3 % ophthalmic solution 1 drop  1 drop Both Eyes QID PRN Rankin, Shuvon B, NP      . nicotine (NICODERM CQ - dosed in mg/24 hours) patch 21 mg  21 mg Transdermal Daily PRN Rankin, Shuvon B, NP      . polyethylene glycol (MIRALAX / GLYCOLAX) packet 17 g  17 g Oral BID Rankin, Shuvon B, NP      . senna-docusate (Senokot-S) tablet 1 tablet  1 tablet Oral BID Rankin, Shuvon B, NP      . thiamine (VITAMIN B-1) tablet 100 mg  100 mg Oral Daily Rankin, Shuvon B, NP   100 mg at 11/19/17 0759  . traZODone (DESYREL) tablet 50 mg  50 mg Oral QHS PRN Nira Conn A, NP   50 mg at 11/18/17 2101  .  vortioxetine HBr (TRINTELLIX) tablet 10 mg  10 mg Oral Daily Cobos, Rockey Situ, MD   10 mg at 11/19/17 0800   PTA Medications: Medications Prior to Admission  Medication Sig Dispense Refill Last Dose  . amLODipine (NORVASC) 10 MG tablet TAKE 1 TABLET(10 MG) BY MOUTH DAILY 90 tablet 3 11/02/2017  . ferrous sulfate 325 (65 FE) MG tablet Take 1 tablet (325 mg total) by mouth daily with breakfast. 30 tablet 0   . FLUoxetine (PROZAC) 40 MG capsule Take 40 mg by mouth daily.  0 11/02/2017  . folic acid (FOLVITE) 1 MG tablet Take 1 tablet (1 mg total) by mouth daily.     Marland Kitchen HYDROcodone-acetaminophen (NORCO) 10-325 MG tablet Take 1 tablet by mouth every 6 (six) hours as needed for moderate pain.    Past Week at Unknown time  . polyethylene glycol (MIRALAX / GLYCOLAX) packet Take 17 g by mouth 2 (two) times daily. 60 packet 0   . thiamine 100 MG tablet Take 1 tablet (100 mg total) by mouth daily.       Patient Stressors: Medication change or noncompliance Substance abuse  Patient Strengths: Ability for insight Average or above average intelligence Capable of independent living [employer] of knowledge  Motivation for treatment/growth Supportive family/friends  Treatment Modalities: Medication Management, Group therapy, Case management,  1 to 1 session with clinician, Psychoeducation, Recreational therapy.   Physician Treatment Plan for Primary Diagnosis: MDD, recurrent, severe  Medication Management: Evaluate patient's response, side effects, and tolerance of medication regimen.  Therapeutic Interventions: 1 to 1 sessions, Unit Group sessions and Medication administration.  Evaluation of Outcomes: Progressing  Physician Treatment Plan for Secondary Diagnosis: Principal Problem:   Major depressive disorder, recurrent episode, severe (HCC) Active Problems:   Routine general medical examination at a health care facility   Polysubstance dependence including opioid drug with daily use  (HCC)   Medication Management: Evaluate patient's response, side effects, and tolerance of medication regimen.  Therapeutic Interventions: 1 to 1 sessions, Unit Group sessions and Medication administration.  Evaluation of Outcomes: Progressing   RN Treatment Plan for Primary Diagnosis: MDD, recurrent, severe Long Term Goal(s): Knowledge of disease and therapeutic regimen to maintain health will improve  Short Term Goals: Ability to remain free from injury will improve, Ability to disclose and discuss suicidal ideas and Ability to identify and develop effective coping behaviors will improve  Medication Management: RN will administer medications as ordered by provider, will assess and evaluate patient's response and provide education to patient for prescribed medication. RN will report any adverse and/or side effects to prescribing provider.  Therapeutic Interventions: 1 on 1 counseling sessions, Psychoeducation, Medication administration, Evaluate responses to treatment, Monitor vital signs and CBGs as ordered, Perform/monitor CIWA, COWS, AIMS and Fall Risk screenings as ordered, Perform wound care treatments as ordered.  Evaluation of Outcomes: Progressing  LCSW Treatment Plan for Primary Diagnosis: MDD, recurrent, severe Long Term Goal(s): Safe transition to appropriate next level of care at discharge, Engage patient in therapeutic group addressing interpersonal concerns.  Short Term Goals: Engage patient in aftercare planning with referrals and resources, Facilitate patient progression through stages of change regarding substance use diagnoses and concerns and Identify triggers associated with mental health/substance abuse issues  Therapeutic Interventions: Assess for all discharge needs, 1 to 1 time with Social worker, Explore available resources and support systems, Assess for adequacy in community support network, Educate family and significant other(s) on suicide prevention, Complete  Psychosocial Assessment, Interpersonal group therapy.  Evaluation of Outcomes: Progressing  Progress in Treatment: Attending groups: Yes Participating in groups: Yes Taking medication as prescribed: Yes. Toleration medication: Yes. Family/Significant other contact made: SPE completed with pt's girlfriend  Patient understands diagnosis: Yes. Discussing patient identified problems/goals with staff: Yes. Medical problems stabilized or resolved: Yes. Denies suicidal/homicidal ideation: Yes. Issues/concerns per patient self-inventory: No. Other: n/a  New problem(s) identified: No, Describe:  n/a  New Short Term/Long Term Goal(s): detox, medication management for mood stabilization; elimination of SI thoughts; development of comprehensive mental wellness/sobriety plan.   Patient Goals:  "I need my medications adjusted."   Discharge Plan or Barriers: Pt declined at [facility]. Referral made to [facility]--they may have bed for him early this week. [facility] possible outpatient referral, as he and pt's girlfriend do not want him to return to [facility].  [facility] pamphlet, Mobile Crisis information, and AA/NA information provided to patient for additional community support and resources.   Reason for Continuation of Hospitalization: Anxiety Depression Medication stabilization  Estimated Length of Stay: Wed, 11/21/17  Attendees: Patient: 11/19/2017 8:52 AM  Physician: Dr. Jama Flavors MD; Dr. Altamese  MD 11/19/2017 8:52 AM  Nursing: Arlyss Repress RN; Cala Bradford RN 11/19/2017 8:52 AM  RN Care Manager:x  11/19/2017 8:52 AM  Social Worker: Corrie MckusickHeather Kavir Savoca LCSW 11/19/2017 8:52 AM  Recreational Therapist: x 11/19/2017 8:52 AM  Other: Hillery Jacksanika Lewis NP; Renold DonLashaunda Thomas NP 11/19/2017 8:52 AM  Other:  11/19/2017 8:52 AM  Other: 11/19/2017 8:52 AM    Scribe for Treatment Team: Rona RavensHeather S Niketa Turner, LCSW 11/19/2017 8:52 AM

## 2017-11-19 NOTE — Telephone Encounter (Signed)
I assume this is an FYI regarding his no show today correct?  Is he still inpatient?

## 2017-11-19 NOTE — Progress Notes (Signed)
[facility] been [facility] for Wednesday, 8/28 at 10:00AM. Pt made aware. He is worried that he needs to get stiches removed and get hemoglobin rechecked first. CSW continuing to assess.  Marylee Belzer S. Alan RipperHolloway, MSW, LCSW Clinical Social Worker 11/19/2017 3:04 PM

## 2017-11-19 NOTE — Progress Notes (Signed)
Recreation Therapy Notes  Date: 8.26.19 Time: 0930 Location: [address] Dayroom  Group Topic: Stress Management  Goal Area(s) Addresses:  Patient will verbalize importance of using healthy stress management.  Patient will identify positive emotions associated with healthy stress management.   Behavioral Response: Engaged  Intervention: Stress Management  Activity :  Guided Imagery.  LRT introduced the stress management technique of guided imagery.  LRT played a meditation on the computer for patients to engage in the activity.  Patients were to follow along as the meditation played.  Education:  Stress Management, Discharge Planning.   Education Outcome: Acknowledges edcuation/In group clarification offered/Needs additional education  Clinical Observations/Feedback: Pt attended and participated in group.     Caroll RancherMarjette Jahara Dail, LRT/CTRS         Caroll RancherLindsay, Timtohy Broski A 11/19/2017 12:09 PM

## 2017-11-20 ENCOUNTER — Encounter: Payer: Self-pay | Admitting: Gastroenterology

## 2017-11-20 MED ORDER — FOLIC ACID 1 MG PO TABS: 1.0000 mg | ORAL_TABLET | Freq: Every day | ORAL | 0 refills | Status: DC

## 2017-11-20 MED ORDER — AMLODIPINE BESYLATE 5 MG PO TABS: 5.0000 mg | ORAL_TABLET | Freq: Every day | ORAL | 0 refills | Status: DC

## 2017-11-20 MED ORDER — GABAPENTIN 100 MG PO CAPS: 100.0000 mg | ORAL_CAPSULE | Freq: Three times a day (TID) | ORAL | 0 refills | Status: DC

## 2017-11-20 MED ORDER — VORTIOXETINE HBR 10 MG PO TABS: 10.0000 mg | ORAL_TABLET | Freq: Every day | ORAL | 0 refills | Status: DC

## 2017-11-20 MED ORDER — HYDROXYZINE HCL 25 MG PO TABS: 25.0000 mg | ORAL_TABLET | Freq: Four times a day (QID) | ORAL | 0 refills | Status: DC | PRN

## 2017-11-20 MED ORDER — NICOTINE 21 MG/24HR TD PT24: 21.0000 mg | MEDICATED_PATCH | Freq: Every day | TRANSDERMAL | 0 refills | Status: DC | PRN

## 2017-11-20 MED ORDER — NAPHAZOLINE-PHENIRAMINE 0.025-0.3 % OP SOLN: 1.0000 [drp] | Freq: Four times a day (QID) | OPHTHALMIC | 0 refills | Status: DC | PRN

## 2017-11-20 MED ORDER — HYDROCODONE-ACETAMINOPHEN 10-325 MG PO TABS: 1.0000 | ORAL_TABLET | ORAL | 0 refills | Status: DC | PRN

## 2017-11-20 MED ORDER — TRAZODONE HCL 50 MG PO TABS: 50.0000 mg | ORAL_TABLET | Freq: Every evening | ORAL | 0 refills | Status: DC | PRN

## 2017-11-20 MED ORDER — FERROUS SULFATE 325 (65 FE) MG PO TABS: 325.0000 mg | ORAL_TABLET | Freq: Every day | ORAL | 0 refills | Status: DC

## 2017-11-20 MED ORDER — SENNOSIDES-DOCUSATE SODIUM 8.6-50 MG PO TABS: 1.0000 | ORAL_TABLET | Freq: Two times a day (BID) | ORAL | 0 refills | Status: DC

## 2017-11-20 NOTE — Progress Notes (Signed)
Pt has been [facility] for 11/21/17 at 10am. Accepting provider: Dr. Uvaldo RisingFaulkner. [name] will transport him directly to facility and will pick him up at [facility] on Wed, 8/28. CSW and pt rescheduled his colonoscopy and back specialist appts. CSW and pt also attempted to contact pt's employer.  Zeph Riebel S. Alan RipperHolloway, MSW, LCSW Clinical Social Worker 11/20/2017 9:25 AM

## 2017-11-20 NOTE — Telephone Encounter (Signed)
This is an Financial planner[employer]I message. Patient is still in behavioral health at this time. As stated in previous message, he will be moved to a long term facility.

## 2017-11-20 NOTE — Progress Notes (Signed)
  [facility]  Will you be returning to the same living situation after discharge:  No.[facility].  At discharge, do you have transportation home?: Yes,  girlfriend will pick him up at 8am 8/28 (Wed) and she will transport him directly to [facility]  Do you have the ability to pay for your medications: Yes,  BCBS insurance  Release of information consent forms completed and submitted to medical records by CSW.   Patient to Follow up at: Follow-up Information    [facility] on 11/21/2017.   Why:  You have been accepted for admission on Wednesday 8/28 at 10:00AM. Admitting MD: Dr. Uvaldo RisingFaulkner.  Contact information: [address] [place] [address] [phone]           Next level of care provider has access to [facility] Link:no  Safety Planning and Suicide Prevention discussed: Yes,  SPE completed with pt's girlfriend; SPI pamphlet and Mobile Crisis information provided to pt  Have you used any form of tobacco in the last 30 days? (Cigarettes, Smokeless Tobacco, Cigars, and/or Pipes): Yes  Has patient been referred to the Quitline?: Patient refused referral  Patient has been referred for addiction treatment: Yes  Rona RavensHeather S Laruen Risser, LCSW 11/20/2017, 8:53 AM

## 2017-11-20 NOTE — [facility] Group Notes (Signed)
[facility] Mental Health Association Group Therapy 11/20/2017 1:15pm  Type of Therapy: Mental Health Association Presentation  Participation Level: Active  Participation Quality: Attentive  Affect: Appropriate  Cognitive: Oriented  Insight: Developing/Improving  Engagement in Therapy: Engaged  Modes of Intervention: Discussion, Education and Socialization  Summary of Progress/Problems: Mental Health Association (MHA) Speaker came to talk about his personal journey with mental health. The pt processed ways by which to relate to the speaker. MHA speaker provided handouts and educational information pertaining to groups and services offered by the [facility]. Pt was engaged in speaker's presentation and was receptive to resources provided.    Rona RavensHeather S Jalen Daluz, LCSW 11/20/2017 3:50 PM

## 2017-11-20 NOTE — Progress Notes (Signed)
[facility] MD Progress Note  11/20/2017 11:40 AM Yarden Hillis  MRN:  [id number]  Subjective: Abhijay reports, "I'm doing a lot better. My mood is good. I am going to be discharged tomorrow morning, going to [facility]. I would like my Trazodone & Vistaril to be discontinued. I do not want them & I'm not going to take them after discharge".   This is a 52 year old AA male, lives with GF, employed, admitted for depression, suicidal ideations. He has a past history of opiate ( heroin), and cocaine abuse although states he had not been using regularly recently. He has history of prior psychiatric admission for depression and self cutting.  Zadiel is seen, chart reviewed. The chart findings discussed with the treatment team. Today, he presents alert &  oriented x 4. He is visible on the unit attending group sessions. He says he is doing much better. Says, he does not need Trazodone or Vistaril in his list of medications. Has asked for both medications to be discontinued as he would not take them after discharge. He says he is doing well on the rest of the medicines. He denies any adverse effects. Denies any SIHI or AVH & no psychosis observed. He is complaint with his medications and today denies any intolerances or medication related side effects. Deklan is to be discharged tomorrow morning to [facility]. He denies any new issues or concerns.  Principal Problem: Major depressive disorder, recurrent episode, severe (HCC) Diagnosis:   Patient Active Problem List   Diagnosis Date Noted  . Major depressive disorder, recurrent episode, severe (HCC) [F33.2] 08/24/2015    Priority: High  . Routine general medical examination at a health care facility [Z00.00] 05/24/2015    Priority: Medium  . Polysubstance dependence including opioid drug with daily use (HCC) [F19.20] 11/14/2017  . Suicidal ideation [R45.851] 11/13/2017  . Anxiety and depression [F41.9, F32.9]   . Acute renal failure (HCC) [N17.9]   . Altered mental status  [R41.82]   . Cocaine use disorder (HCC) [F14.10]   . AKI (acute kidney injury) (HCC) [N17.9] 11/07/2017  . Leukocytosis [D72.829] 11/07/2017  . Chronic kidney disease (CKD), stage III (moderate) (HCC) [N18.3] 09/19/2016  . Elevated LFTs [R94.5] 09/19/2016  . Insomnia [G47.00]   . MDD (major depressive disorder), single episode, severe , no psychosis (HCC) [F32.2] 08/14/2015  . Cannabis use disorder, mild, abuse [F12.10] 08/14/2015  . Cocaine abuse (HCC) [F14.10] 08/10/2015  . Weight loss, non-intentional [R63.4] 08/10/2015  . Depression [F32.9] 08/10/2015  . Essential hypertension [I10]   . Vaccine refused by patient [Z28.20] 05/24/2015  . Chronic back pain [M54.9, G89.29] 05/24/2015  . Family history of cancer [Z80.9] 05/24/2015  . Screening for prostate cancer [Z12.5] 05/24/2015  . Smoker [F17.200] 05/24/2015  . Screen for colon cancer [Z12.11] 05/24/2015  . Erectile dysfunction [N52.9] 01/31/2014   Total Time spent with patient: 15 minutes  Past Psychiatric History: See H&P  Past Medical History:  Past Medical History:  Diagnosis Date  . Chronic back pain    managed [facility]  . CKD (chronic kidney disease), stage III (HCC) 10/2017  . Erectile dysfunction   . History of substance abuse   . Hx of suicide attempt   . Hypertension     Past Surgical History:  Procedure Laterality Date  . ANKLE SURGERY     right  . APPLICATION OF WOUND VAC Right 08/17/2015   Procedure: APPLICATION OF WOUND VAC;  Surgeon: Dominica Severin, MD;  Location: [facility] OR;  Service: Orthopedics;  Laterality: Right;  . HAND SURGERY     fracture, ORIG, teenage years, right.  . I&D EXTREMITY Right 08/10/2015   Procedure: IRRIGATION AND DEBRIDEMENT SKIN, SUBCUTANEOS TISSUE AND  MUSCLE, CARPAL TUNNEL RELEASE, MEDIAN NERVE LYSIS WITH NERVE WRAP;  Surgeon: Dominica Severin, MD;  Location: [facility] OR;  Service: Orthopedics;  Laterality: Right;  . INCISION AND DRAINAGE OF WOUND Right 08/15/2015    Procedure: IRRIGATION AND DEBRIDEMENT WOUND;  Surgeon: Francena Hanly, MD;  Location: [facility] ORS;  Service: Orthopedics;  Laterality: Right;  . LUMBAR EPIDURAL INJECTION     [facility]  . SKIN SPLIT GRAFT Right 08/17/2015   Procedure: I&D RIGHT FOREARM/POSSIBLE SKIN GRAFT;  Surgeon: Dominica Severin, MD;  Location: [facility] OR;  Service: Orthopedics;  Laterality: Right;  . TENDON REPAIR N/A 07/24/2015   Procedure: WRIST LACERATION AND TENDON REPAIR;  Surgeon: Bradly Bienenstock, MD;  Location: [facility] OR;  Service: Orthopedics;  Laterality: N/A;  . WOUND EXPLORATION Right 08/15/2015   Procedure: WOUND EXPLORATION right forearm;  Surgeon: Francena Hanly, MD;  Location: [facility]S;  Service: Orthopedics;  Laterality: Right;   Family History:  Family History  Problem Relation Age of Onset  . Cancer Mother        lung  . Anxiety disorder Mother   . Cancer Father        throat  . Diabetes Paternal Grandmother   . Cancer Paternal Aunt   . Heart disease Neg Hx   . Stroke Neg Hx    Family Psychiatric  History: See H&P  Social History:  Social History   Substance and Sexual Activity  Alcohol Use Yes  . Alcohol/week: 0.0 standard drinks   Comment: occasional     Social History   Substance and Sexual Activity  Drug Use Yes  . Types: Marijuana, Cocaine, Heroin   Comment: last use cocaine 08/08/15, last use MJ 08/08/15    Social History   Socioeconomic History  . Marital status: Single    Spouse name: Not on file  . Number of children: Not on file  . Years of education: Not on file  . Highest education level: Not on file  Occupational History  . Not on file  Social Needs  . Financial resource strain: Not on file  . Food insecurity:    Worry: Not on file    Inability: Not on file  . Transportation needs:    Medical: Not on file    Non-medical: Not on file  Tobacco Use  . Smoking status: Current Every Day Smoker    Packs/day: 0.50    Years: 6.00    Pack years: 3.00    Types: Cigarettes   . Smokeless tobacco: Never Used  Substance and Sexual Activity  . Alcohol use: Yes    Alcohol/week: 0.0 standard drinks    Comment: occasional  . Drug use: Yes    Types: Marijuana, Cocaine, Heroin    Comment: last use cocaine 08/08/15, last use MJ 08/08/15  . Sexual activity: Yes  Lifestyle  . Physical activity:    Days per week: Not on file    Minutes per session: Not on file  . Stress: Not on file  Relationships  . Social connections:    Talks on phone: Not on file    Gets together: Not on file    Attends religious service: Not on file    Active member of club or organization: Not on file    Attends meetings of clubs or  organizations: Not on file    Relationship status: Not on file  Other Topics Concern  . 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   No children.  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   09/2017.    Additional Social History:   Sleep: Fair  Appetite:  Good  Current Medications: Current Facility-Administered Medications  Medication Dose Route Frequency Provider Last Rate Last Dose  . amLODipine (NORVASC) tablet 5 mg  5 mg Oral Daily Rankin, Shuvon B, NP   5 mg at 11/20/17 0809  . ferrous sulfate tablet 325 mg  325 mg Oral Q breakfast Rankin, Shuvon B, NP   325 mg at 11/20/17 0809  . folic acid (FOLVITE) tablet 1 mg  1 mg Oral Daily Rankin, Shuvon B, NP   1 mg at 11/20/17 0809  . gabapentin (NEURONTIN) capsule 100 mg  100 mg Oral TID Cobos, Rockey Situ, MD   100 mg at 11/20/17 0809  . HYDROcodone-acetaminophen (NORCO) 10-325 MG per tablet 1 tablet  1 tablet Oral Q4H PRN Rankin, Shuvon B, NP   1 tablet at 11/20/17 1017  . loratadine (CLARITIN) tablet 10 mg  10 mg Oral Daily PRN Rankin, Shuvon B, NP      . naphazoline-pheniramine (NAPHCON-A) 0.025-0.3 % ophthalmic solution 1 drop  1 drop Both Eyes QID PRN Rankin, Shuvon B, NP      . nicotine (NICODERM CQ - dosed in mg/24 hours)  patch 21 mg  21 mg Transdermal Daily PRN Rankin, Shuvon B, NP      . polyethylene glycol (MIRALAX / GLYCOLAX) packet 17 g  17 g Oral BID Rankin, Shuvon B, NP      . senna-docusate (Senokot-S) tablet 1 tablet  1 tablet Oral BID Rankin, Shuvon B, NP   1 tablet at 11/20/17 0809  . thiamine (VITAMIN B-1) tablet 100 mg  100 mg Oral Daily Rankin, Shuvon B, NP   100 mg at 11/20/17 0809  . vortioxetine HBr (TRINTELLIX) tablet 10 mg  10 mg Oral Daily Cobos, Rockey Situ, MD   10 mg at 11/20/17 0809   Lab Results:  No results found for this or any previous visit (from the past 48 hour(s)).  Blood Alcohol level:  Lab Results  Component Value Date   ETH <10 11/07/2017   ETH <5 08/10/2015   Metabolic Disorder Labs: Lab Results  Component Value Date   HGBA1C 5.6 08/31/2016   MPG 114 08/31/2016   MPG 120 (H) 05/24/2015   No results found for: PROLACTIN Lab Results  Component Value Date   CHOL 254 (H) 10/08/2017   TRIG 183 (H) 10/08/2017   HDL 58 10/08/2017   CHOLHDL 4.4 10/08/2017   VLDL 33 (H) 08/31/2016   LDLCALC 159 (H) 10/08/2017   LDLCALC 129 (H) 08/31/2016   Physical Findings: AIMS: Facial and Oral Movements Muscles of Facial Expression: None, normal Lips and Perioral Area: None, normal Jaw: None, normal Tongue: None, normal,Extremity Movements Upper (arms, wrists, hands, fingers): None, normal Lower (legs, knees, ankles, toes): None, normal, Trunk Movements Neck, shoulders, hips: None, normal, Overall Severity Severity of abnormal movements (highest score from questions above): None, normal Incapacitation due to abnormal movements: None, normal Patient's awareness of abnormal movements (rate only patient's report): No Awareness, Dental Status Current problems with teeth and/or dentures?: No Does patient usually wear dentures?: No  CIWA:  CIWA-Ar Total: 1 COWS:  COWS Total Score: 1  Musculoskeletal:  Strength & Muscle Tone: within normal limits Gait & Station: normal Patient  leans: N/A  Psychiatric Specialty Exam: Physical Exam  Nursing note and vitals reviewed. Constitutional: He is oriented to person, place, and time.  Neurological: He is alert and oriented to person, place, and time.    Review of Systems  Constitutional: Negative for chills and fever.  Respiratory: Negative for cough and shortness of breath.   Cardiovascular: Negative for chest pain.  Gastrointestinal: Negative for abdominal pain, heartburn, nausea and vomiting.  Psychiatric/Behavioral: Positive for depression. Negative for hallucinations and suicidal ideas. The patient is nervous/anxious. The patient does not have insomnia.   Today denies dizziness or lightheadedness, no chest pain, no shortness of breath, no vomiting  Blood pressure (!) 148/111, pulse 64, temperature 98 F (36.7 C), resp. rate 20, height 6\' 2"  (1.88 m), weight 99.8 kg.Body mass index is 28.25 kg/m.  General Appearance: Well Groomed  Eye Contact:  Good  Speech:  Normal Rate  Volume:  Normal  Mood:  Partially improved mood  Affect:  Appropriate  Thought Process:  Linear and Descriptions of Associations: Intact  Orientation:  Other:  he is alert, attentive and fully oriented x 3   Thought Content:  no hallucinations, no delusions   Suicidal Thoughts:  No denies any suicidal plan or intention, contracts for safety   Homicidal Thoughts:  No denies any homicidal or violent ideations  Memory:  recent and remote grossly intact   Judgement: Improving  Insight:  Fair/improving  Psychomotor Activity:  Normal  Concentration:  Concentration: Good and Attention Span: Good  Recall:  Good  Fund of Knowledge:  Good  Language:  Good  Akathisia:  No  Handed:    AIMS (if indicated):     Assets:  Physical Health Resilience  ADL's:  Intact  Cognition:  WNL  Sleep:  Number of Hours: 4.75   Assessment - 52 year old male, history of depression, prior suicide attempt by self cutting , anxiety,  substance abuse, who was admitted  due to worsening depression and suicidal ideations.  At this time patient continues to present with slow improvement in mood..  Currently denies suicidal ideations.  Denies medication related side effects or somatic complaints.   Daily contact with patient to assess and evaluate symptoms and progress in treatment and Medication management .  - Continue inpatient hospitalization.  - Will continue today 11/20/2017 plan as below except where it is noted.  Encourage group and milieu participation to work on coping skills and symptom reduction.  Continue Neurontin  100 mgrs TID for agitation/pain.  Discontinue Vistaril  25 mgrs Q 6 hours PRN, patient declines to take at this time.  Continue Norvasc 5mg  QDAY for HTN.  Continue Trintellix 10 mg daily for depression.  Continue Ferrous Sulfate 325mg  QDAY for anemia.  Continue Naphcon-A 1 drop both eyes QID prn eye irritation.  Continue Norco 10-325mg  1 tablet q4h prn severe pain (resuming home regimen).  Treatment team working on disposition planning options  Patient is scheduled to be discharged to [facility] tomorrow morning.  Armandina StammerAgnes Nwoko, NP, PMHNP, FNP-BC 11/20/2017, 11:40 AM  Patient ID: Mady HaagensenJohn Piazza, male   DOB: 1965/10/13, 52 y.o.   MRN: [id number]  RN: [id number]

## 2017-11-20 NOTE — Discharge Summary (Addendum)
Physician Discharge Summary Note  Patient:  Benjamin [name] is an 52 y.o., male  MRN:  [id number]  DOB:  01/03/1966  Patient phone:  [phone] (home)   Patient address:   [address] [place] [address],   Total Time spent with patient: Greater than 30 minutes  Date of Admission:  11/13/2017  Date of Discharge: 11-21-17  Reason for Admission: Worsening depression, anxiety, SI with plan to cut himself, and worsening relapse of multiple illicit substances including alcohol, cocaine, and heroin.   Principal Problem: Major depressive disorder, recurrent episode, severe [facility])  Discharge Diagnoses: Patient Active Problem List   Diagnosis Date Noted  . Major depressive disorder, recurrent episode, severe (HCC) [F33.2] 08/24/2015    Priority: High  . Routine general medical examination at a health care facility [Z00.00] 05/24/2015    Priority: Medium  . Polysubstance dependence including opioid drug with daily use (HCC) [F19.20] 11/14/2017  . Suicidal ideation [R45.851] 11/13/2017  . Anxiety and depression [F41.9, F32.9]   . Acute renal failure (HCC) [N17.9]   . Altered mental status [R41.82]   . Cocaine use disorder (HCC) [F14.10]   . AKI (acute kidney injury) (HCC) [N17.9] 11/07/2017  . Leukocytosis [D72.829] 11/07/2017  . Chronic kidney disease (CKD), stage III (moderate) (HCC) [N18.3] 09/19/2016  . Elevated LFTs [R94.5] 09/19/2016  . Insomnia [G47.00]   . MDD (major depressive disorder), single episode, severe , no psychosis (HCC) [F32.2] 08/14/2015  . Cannabis use disorder, mild, abuse [F12.10] 08/14/2015  . Cocaine abuse (HCC) [F14.10] 08/10/2015  . Weight loss, non-intentional [R63.4] 08/10/2015  . Depression [F32.9] 08/10/2015  . Essential hypertension [I10]   . Vaccine refused by patient [Z28.20] 05/24/2015  . Chronic back pain [M54.9, G89.29] 05/24/2015  . Family history of cancer [Z80.9] 05/24/2015  . Screening for prostate cancer [Z12.5] 05/24/2015  .  Smoker [F17.200] 05/24/2015  . Screen for colon cancer [Z12.11] 05/24/2015  . Erectile dysfunction [N52.9] 01/31/2014   Past Psychiatric History: Cannabis abuse, Opioid use disorder, MDD, recurrent.  Past Medical History:  Past Medical History:  Diagnosis Date  . Chronic back pain    managed by [facility]  . CKD (chronic kidney disease), stage III (HCC) 10/2017  . Erectile dysfunction   . History of substance abuse   . Hx of suicide attempt   . Hypertension     Past Surgical History:  Procedure Laterality Date  . ANKLE SURGERY     right  . APPLICATION OF WOUND VAC Right 08/17/2015   Procedure: APPLICATION OF WOUND VAC;  Surgeon: Dominica Severin, MD;  Location: [facility] OR;  Service: Orthopedics;  Laterality: Right;  . HAND SURGERY     fracture, ORIG, teenage years, right.  . I&D EXTREMITY Right 08/10/2015   Procedure: IRRIGATION AND DEBRIDEMENT SKIN, SUBCUTANEOS TISSUE AND  MUSCLE, CARPAL TUNNEL RELEASE, MEDIAN NERVE LYSIS WITH NERVE WRAP;  Surgeon: Dominica Severin, MD;  Location: [facility] OR;  Service: Orthopedics;  Laterality: Right;  . INCISION AND DRAINAGE OF WOUND Right 08/15/2015   Procedure: IRRIGATION AND DEBRIDEMENT WOUND;  Surgeon: Francena Hanly, MD;  Location: [facility]S;  Service: Orthopedics;  Laterality: Right;  . LUMBAR EPIDURAL INJECTION     [facility]  . SKIN SPLIT GRAFT Right 08/17/2015   Procedure: I&D RIGHT FOREARM/POSSIBLE SKIN GRAFT;  Surgeon: Dominica Severin, MD;  Location: [facility] OR;  Service: Orthopedics;  Laterality: Right;  . TENDON REPAIR N/A 07/24/2015   Procedure: WRIST LACERATION AND TENDON REPAIR;  Surgeon: Bradly Bienenstock, MD;  Location: [facility] OR;  Service: Orthopedics;  Laterality: N/A;  . WOUND EXPLORATION Right 08/15/2015   Procedure: WOUND EXPLORATION right forearm;  Surgeon: Francena Hanly, MD;  Location: [facility];  Service: Orthopedics;  Laterality: Right;   Family History:  Family History  Problem Relation Age of Onset  . Cancer Mother         lung  . Anxiety disorder Mother   . Cancer Father        throat  . Diabetes Paternal Grandmother   . Cancer Paternal Aunt   . Heart disease Neg Hx   . Stroke Neg Hx    Family Psychiatric  History: See H&P  Social History:  Social History   Substance and Sexual Activity  Alcohol Use Yes  . Alcohol/week: 0.0 standard drinks   Comment: occasional     Social History   Substance and Sexual Activity  Drug Use Yes  . Types: Marijuana, Cocaine, Heroin   Comment: last use cocaine 08/08/15, last use MJ 08/08/15    Social History   Socioeconomic History  . Marital status: Single    Spouse name: Not on file  . Number of children: Not on file  . Years of education: Not on file  . Highest education level: Not on file  Occupational History  . Not on file  Social Needs  . Financial resource strain: Not on file  . Food insecurity:    Worry: Not on file    Inability: Not on file  . Transportation needs:    Medical: Not on file    Non-medical: Not on file  Tobacco Use  . Smoking status: Current Every Day Smoker    Packs/day: 0.50    Years: 6.00    Pack years: 3.00    Types: Cigarettes  . Smokeless tobacco: Never Used  Substance and Sexual Activity  . Alcohol use: Yes    Alcohol/week: 0.0 standard drinks    Comment: occasional  . Drug use: Yes    Types: Marijuana, Cocaine, Heroin    Comment: last use cocaine 08/08/15, last use MJ 08/08/15  . Sexual activity: Yes  Lifestyle  . Physical activity:    Days per week: Not on file    Minutes per session: Not on file  . Stress: Not on file  Relationships  . Social connections:    Talks on phone: Not on file    Gets together: Not on file    Attends religious service: Not on file    Active member of club or organization: Not on file    Attends meetings of clubs or organizations: Not on file    Relationship status: Not on file  Other Topics Concern  . 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   No children.  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   09/2017.    Hospital Course: (Per Md's discharge SRA): Benjamin [name] is a 52 y/o M wit history of treatment for depression, anxiety, and polysubstance abuse who was admitted voluntarily from [facility] with worsening depression, anxiety, SI with plan to cut himself, and worsening relapse of multiple illicit substances including alcohol, cocaine, and heroin. Pt was medically cleared and then transferred to [facility] for additional treatment and stabilization.Pt was started on trial of monotherapy with abilify to help simplify his home regimen, and pt reported incremental improvement of his presenting symptoms. However, he noted some anxiety associated with abilify so he was  changed to trintellix monotherapy. He worked with SW team and secured placement to for substance use treatment at [facility] for morning of 8/28.  Today upon evaluation, pt shares, "I'm doing a lot better. I'm going home tomorrow." Pt expresses that he is looking forward to starting at [facility] for substance use treatment. He denies other specific concerns. He is sleeping well. His appetite is good. He denies other physical complaints. He denies SI/HI/AH/VH. He is tolerating his medications well, and he is in agreement to continue his current regimen without changes. He does not feel trazodone is helpful for sleep, so we will discontinue it. He was able to engage in safety planning including [facility] or contact emergency services if he feels unable to maintain his own safety or the safety of others. Pt had no further questions, comments, or concerns.  Plan Of Care/Follow-up recommendations:   - Discharge to outpatient level of service (on AM of 8/28)  -Continue Neurontin 100 mgrs TID for agitation/pain.  -Continue Norvasc 5mg  QDAY for HTN.  -Continue Trintellix 10 mg daily for depression.  -Continue Ferrous Sulfate 325mg   QDAY for anemia.  -Continue Naphcon-A 1 drop both eyes QID prn eye irritation.  -Continue Norco 10-325mg  1 tablet q4h prn severe pain (resuming home regimen).  -Patient is scheduled to be discharged to [facility] today.  Activity:  as tolerated Diet:  normal Tests:  NA Other:  see above for DC plan  Physical Findings: AIMS: Facial and Oral Movements Muscles of Facial Expression: None, normal Lips and Perioral Area: None, normal Jaw: None, normal Tongue: None, normal,Extremity Movements Upper (arms, wrists, hands, fingers): None, normal Lower (legs, knees, ankles, toes): None, normal, Trunk Movements Neck, shoulders, hips: None, normal, Overall Severity Severity of abnormal movements (highest score from questions above): None, normal Incapacitation due to abnormal movements: None, normal Patient's awareness of abnormal movements (rate only patient's report): No Awareness, Dental Status Current problems with teeth and/or dentures?: No Does patient usually wear dentures?: No  CIWA:  CIWA-Ar Total: 1 COWS:  COWS Total Score: 1  Musculoskeletal: Strength & Muscle Tone: within normal limits Gait & Station: normal Patient leans: N/A  Psychiatric Specialty Exam: Physical Exam  Nursing note and vitals reviewed. Constitutional: He is oriented to person, place, and time. He appears well-developed.  HENT:  Head: Normocephalic.  Eyes: Pupils are equal, round, and reactive to light.  Neck: Normal range of motion.  Cardiovascular: Normal rate.  Respiratory: Effort normal.  GI: Soft.  Genitourinary:  Genitourinary Comments: Deferred  Musculoskeletal: Normal range of motion.  Neurological: He is alert and oriented to person, place, and time.  Skin: Skin is warm.    Review of Systems  Constitutional: Negative.   HENT: Negative.   Eyes: Negative.   Respiratory: Negative.   Cardiovascular: Negative.   Gastrointestinal: Negative.   Genitourinary: Negative.   Musculoskeletal:  Negative.   Skin: Negative.   Neurological: Negative.   Endo/Heme/Allergies: Negative.   Psychiatric/Behavioral: Positive for depression (Stable) and substance abuse (Hx. Cocaine use disorder). Negative for hallucinations, memory loss and suicidal ideas (Opioid use disorder). The patient has insomnia (Stable). The patient is not nervous/anxious.     Blood pressure (!) 148/111, pulse 64, temperature 98 F (36.7 C), resp. rate 20, height 6\' 2"  (1.88 m), weight 99.8 kg.Body mass index is 28.25 kg/m.  See Md's SRA   Have you used any form of tobacco in the last 30 days? (Cigarettes, Smokeless Tobacco, Cigars, and/or Pipes): Yes   Has this  patient used any form of tobacco in the last 30 days? (Cigarettes, Smokeless Tobacco, Cigars, and/or Pipes): Yes, an FDA-approved tobacco cessation medication was recommended at discharge.   Blood Alcohol level:  Lab Results  Component Value Date   ETH <10 11/07/2017   ETH <5 08/10/2015   Metabolic Disorder Labs:  Lab Results  Component Value Date   HGBA1C 5.6 08/31/2016   MPG 114 08/31/2016   MPG 120 (H) 05/24/2015   No results found for: PROLACTIN Lab Results  Component Value Date   CHOL 254 (H) 10/08/2017   TRIG 183 (H) 10/08/2017   HDL 58 10/08/2017   CHOLHDL 4.4 10/08/2017   VLDL 33 (H) 08/31/2016   LDLCALC 159 (H) 10/08/2017   LDLCALC 129 (H) 08/31/2016   See Psychiatric Specialty Exam and Suicide Risk Assessment completed by Attending Physician prior to discharge.  Discharge destination:  [facility]  Is patient on multiple antipsychotic therapies at discharge:  No   Has Patient had three or more failed trials of antipsychotic monotherapy by history:  No  Recommended Plan for Multiple Antipsychotic Therapies: NA  Allergies as of 11/20/2017   No Known Allergies     Medication List    STOP taking these medications   FLUoxetine 40 MG capsule Commonly known as:  PROZAC   polyethylene glycol packet Commonly known as:  MIRALAX /  GLYCOLAX   thiamine 100 MG tablet     TAKE these medications     Indication  amLODipine 5 MG tablet Commonly known as:  NORVASC Take 1 tablet (5 mg total) by mouth daily. For high blood pressure Start taking on:  11/21/2017 What changed:    medication strength  how much to take  how to take this  when to take this  additional instructions  Indication:  High Blood Pressure Disorder   ferrous sulfate 325 (65 FE) MG tablet Take 1 tablet (325 mg total) by mouth daily with breakfast. For anemia What changed:  additional instructions  Indication:  Anemia From Inadequate Iron in the Body   folic acid 1 MG tablet Commonly known as:  FOLVITE Take 1 tablet (1 mg total) by mouth daily. For folate deficiency What changed:  additional instructions  Indication:  Anemia From Inadequate Folic Acid   gabapentin 100 MG capsule Commonly known as:  NEURONTIN Take 1 capsule (100 mg total) by mouth 3 (three) times daily. Agitation  Indication:  Agitation   HYDROcodone-acetaminophen 10-325 MG tablet Commonly known as:  NORCO Take 1 tablet by mouth every 4 (four) hours as needed for severe pain. What changed:    when to take this  reasons to take this  Indication:  Pain   naphazoline-pheniramine 0.025-0.3 % ophthalmic solution Commonly known as:  NAPHCON-A Place 1 drop into both eyes 4 (four) times daily as needed for eye irritation.  Indication:  Red Eyes   nicotine 21 mg/24hr patch Commonly known as:  NICODERM CQ - dosed in mg/24 hours Place 1 patch (21 mg total) onto the skin daily as needed (nicotine craving).  Indication:  Nicotine Addiction   senna-docusate 8.6-50 MG tablet Commonly known as:  Senokot-S Take 1 tablet by mouth 2 (two) times daily. For constipation  Indication:  Constipation   vortioxetine HBr 10 MG Tabs tablet Commonly known as:  TRINTELLIX Take 1 tablet (10 mg total) by mouth daily. For depression Start taking on:  11/21/2017  Indication:  Major  Depressive Disorder      Follow-up Information    [facility] Follow up on 11/21/2017.   Why:  You have been accepted for admission on Wednesday 8/28 at 10:00AM. Admitting MD: Dr. Uvaldo Rising.  Contact information: [address] [place] [address] [phone]          Follow-up recommendations: Activity:  As tolerated Diet: As recommended by your primary care doctor. Keep all scheduled follow-up appointments as recommended.  Comments: Patient is instructed prior to discharge to: Take all medications as prescribed by his/her mental healthcare provider. Report any adverse effects and or reactions from the medicines to his/her outpatient provider promptly. Patient has been instructed & cautioned: To not engage in alcohol and or illegal drug use while on prescription medicines. In the event of worsening symptoms, patient is instructed to call the crisis hotline, 911 and or go to the nearest ED for appropriate evaluation and treatment of symptoms. To follow-up with his/her primary care provider for your other medical issues, concerns and or health care needs.   Signed: Armandina Stammer, NP, PMHNP, FNP-BC 11/20/2017, 11:04 AM   Patient seen, Suicide Assessment Completed.  Disposition Plan Reviewed

## 2017-11-20 NOTE — Progress Notes (Signed)
Patient ID: Benjamin HaagensenJohn Kratochvil, male   DOB: 1965/11/12, 52 y.o.   MRN: [id number] D) Pt has been appropriate and cooperative on approach. Affect appropriate to mood. Pt is positive for all unit activities with minimal prompting. Jonny RuizJohn is working on his d/c plan as a goal for today. Pt c/o chronic back pain. No other distress noted. Sutures in right antecubital area removed by MD. Pt denies any thoughts of self harm. A) level 3 obs for safety. Support and encouragement provided. Med ed reinforced. R) cooperative. Receptive.

## 2017-11-20 NOTE — [facility] Suicide Risk Assessment (Signed)
[facility]   Principal Problem: Major depressive disorder, recurrent episode, severe (HCC) Discharge Diagnoses:  Patient Active Problem List   Diagnosis Date Noted  . Polysubstance dependence including opioid drug with daily use (HCC) [F19.20] 11/14/2017  . Suicidal ideation [R45.851] 11/13/2017  . Anxiety and depression [F41.9, F32.9]   . Acute renal failure (HCC) [N17.9]   . Altered mental status [R41.82]   . Cocaine use disorder (HCC) [F14.10]   . AKI (acute kidney injury) (HCC) [N17.9] 11/07/2017  . Leukocytosis [D72.829] 11/07/2017  . Chronic kidney disease (CKD), stage III (moderate) (HCC) [N18.3] 09/19/2016  . Elevated LFTs [R94.5] 09/19/2016  . Major depressive disorder, recurrent episode, severe (HCC) [F33.2] 08/24/2015  . Insomnia [G47.00]   . MDD (major depressive disorder), single episode, severe , no psychosis (HCC) [F32.2] 08/14/2015  . Cannabis use disorder, mild, abuse [F12.10] 08/14/2015  . Cocaine abuse (HCC) [F14.10] 08/10/2015  . Weight loss, non-intentional [R63.4] 08/10/2015  . Depression [F32.9] 08/10/2015  . Essential hypertension [I10]   . Vaccine refused by patient [Z28.20] 05/24/2015  . Routine general medical examination at a health care facility [Z00.00] 05/24/2015  . Chronic back pain [M54.9, G89.29] 05/24/2015  . Family history of cancer [Z80.9] 05/24/2015  . Screening for prostate cancer [Z12.5] 05/24/2015  . Smoker [F17.200] 05/24/2015  . Screen for colon cancer [Z12.11] 05/24/2015  . Erectile dysfunction [N52.9] 01/31/2014    Total Time spent with patient: 30 minutes  Musculoskeletal: Strength & Muscle Tone: within normal limits Gait & Station: normal Patient leans: N/A  Psychiatric Specialty Exam: Review of Systems  Constitutional: Negative for chills and fever.  Respiratory: Negative for cough and shortness of breath.   Cardiovascular: Negative for chest pain.  Gastrointestinal: Negative for abdominal pain,  heartburn, nausea and vomiting.  Psychiatric/Behavioral: Negative for depression, hallucinations and suicidal ideas. The patient is not nervous/anxious and does not have insomnia.     Blood pressure (!) 148/111, pulse 64, temperature 98 F (36.7 C), resp. rate 20, height 6\' 2"  (1.88 m), weight 99.8 kg.Body mass index is 28.25 kg/m.  General Appearance: Casual and Fairly Groomed  Patent attorneyye Contact::  Good  Speech:  Clear and Coherent and Normal Rate  Volume:  Normal  Mood:  Euthymic  Affect:  Congruent  Thought Process:  Coherent and Goal Directed  Orientation:  Full (Time, Place, and Person)  Thought Content:  Logical  Suicidal Thoughts:  No  Homicidal Thoughts:  No  Memory:  Immediate;   Fair Recent;   Fair Remote;   Fair  Judgement:  Fair  Insight:  Fair  Psychomotor Activity:  Normal  Concentration:  Good  Recall:  Fair  Fund of Knowledge:Good  Language: Fair  Akathisia:  No  Handed:    AIMS (if indicated):     Assets:  Resilience Social Support  Sleep:  Number of Hours: 4.75  Cognition: WNL  ADL's:  Intact   Mental Status Per Nursing Assessment::   On Admission:  Self-harm thoughts  Demographic Factors:  Male, Low socioeconomic status, Living alone and Unemployed  Loss Factors: Financial problems/change in socioeconomic status  Historical Factors: Family history of mental illness or substance abuse and Impulsivity  Risk Reduction Factors:   Positive social support, Positive therapeutic relationship and Positive coping skills or problem solving skills  Continued Clinical Symptoms:  Depression:   Comorbid alcohol abuse/dependence Severe Alcohol/Substance Abuse/Dependencies  Cognitive Features That Contribute To Risk:  None    Suicide Risk:  Minimal: No identifiable suicidal ideation.  Patients presenting with no risk factors but with morbid ruminations; may be classified as minimal risk based on the severity of the depressive symptoms  Follow-up Information     [facility] Follow up on 11/21/2017.   Why:  You have been accepted for admission on Wednesday 8/28 at 10:00AM. Admitting MD: Dr. Uvaldo Rising.  Contact information: [address] [place] [address] [phone]         Subjective Data:  Benjamin [name] is a 52 y/o M wit history of treatment for depression, anxiety, and polysubstance abuse who was admitted voluntarily from [facility] with worsening depression, anxiety, SI with plan to cut himself, and worsening relapse of multiple illicit substances including alcohol, cocaine, and heroin. Pt was medically cleared and then transferred to [facility] for additional treatment and stabilization. Pt was started on trial of monotherapy with abilify to help simplify his home regimen, and pt reported incremental improvement of his presenting symptoms. However, he noted some anxiety associated with abilify so he was changed to trintellix monotherapy. He worked with SW team and secured placement to for substance use treatment at [facility] for morning of 8/28.  Today upon evaluation, pt shares, "I'm doing a lot better. I'm going home tomorrow." Pt expresses that he is looking forward to starting at [facility] for substance use treatment. He denies other specific concerns. He is sleeping well. His appetite is good. He denies other physical complaints. He denies SI/HI/AH/VH. He is tolerating his medications well, and he is in agreement to continue his current regimen without changes. He does not feel trazodone is helpful for sleep, so we will discontinue it. He was able to engage in safety planning including plan to return to [facility] or contact emergency services if he feels unable to maintain his own safety or the safety of others. Pt had no further questions, comments, or concerns.   Plan Of Care/Follow-up recommendations:   - Discharge to outpatient level of service (on AM of 8/28)  -Continue Neurontin  100 mgrs TID for  agitation/pain.  Discontinue Vistaril  25 mgrs Q 6 hours PRN, patient declines to take at this time.  -Continue Norvasc 5mg  QDAY for HTN.  -Continue Trintellix 10 mg daily for depression.  -Continue Ferrous Sulfate 325mg  QDAY for anemia.  -Continue Naphcon-A 1 drop both eyes QID prn eye irritation.  -Continue Norco 10-325mg  1 tablet q4h prn severe pain (resuming home regimen).  -Patient is scheduled to be discharged to [facility] on AM of 8/28  Activity:  as tolerated Diet:  normal Tests:  NA Other:  see above for DC plan  Micheal Likens, MD 11/20/2017, 3:04 PM

## 2017-11-20 NOTE — Progress Notes (Signed)
Recreation Therapy Notes  Animal-Assisted Activity (AAA) Program Checklist/Progress Notes Patient Eligibility Criteria Checklist & Daily Group note for Rec Tx Intervention  Date: 8.27.19 Time: 1430 Location: [address] Dayroom   AAA/T Program Assumption of Risk Form signed by Patient/ or Parent Legal Guardian YES   Patient is free of allergies or sever asthma  YES   Patient reports no fear of animals  YES  Patient reports no history of cruelty to animals YES   Patient understands his/her participation is voluntary YES   Patient washes hands before animal contact YES   Patient washes hands after animal contact  YES   Education: Hand Washing, Appropriate Animal Interaction   Education Outcome: Acknowledges understanding/In group clarification offered/Needs additional education.   Clinical Observations/Feedback: Pt did not attend group.    Benjamin [name], LRT/CTRS         Benjamin [name] 11/20/2017 3:58 PM 

## 2017-11-21 NOTE — Plan of Care (Signed)
  Problem: Education: Goal: Mental status will improve Outcome: Adequate for Discharge   Problem: Education: Goal: Verbalization of understanding the information provided will improve Outcome: Adequate for Discharge   Problem: Activity: Goal: Interest or engagement in activities will improve Outcome: Adequate for Discharge   Problem: Activity: Goal: Sleeping patterns will improve Outcome: Adequate for Discharge    Patient was pleasant upon approach this morning. Patient was anxious about discharge because his girlfriend was supposed to be at the hospital by 8:00 am. Denies SI HI AVH. Denies physical pain.  Patient was compliant with medications prescribed per provider. No questions or concerns.  Patient safety is maintained with 15 minute checks as well as environmental checks. Will continue to monitor.

## 2017-11-21 NOTE — Progress Notes (Signed)
Pt reports he is doing well today and is looking forward to discharge tomorrow to [facility].  He denies SI/HI/AVH. He is still receiving his prn Norco q4h for his chronic back pain.  He voices no needs or concerns at this time.  Support and encouragement offered.  Discharge plans are in process.  [name] is going to transport him to the rehab facility in the morning.  Safety maintained on the unit with q15 minute checks.

## 2017-11-21 NOTE — Progress Notes (Addendum)
CSW and RNCM met with pt, pt's fiance, and friend in the waiting room. Pt was discharged from [facility] this morning to go to [facility] in [place]. Pt arrived there around 12. Pt transported by fiance and friend. Pt was there and accepted. Per pt, when pt was meeting with accepting physician, physician stated that pt cannot be accept due to chronic pain medications. Pt's fiance stated that she spoke with [facility] and was informed by [facility] that they had no beds for pt to return there, but pt could stay in the ED and return to [facility] tomorrow.   CSW called [facility]. CSW was informed that pt could not receive chronic pain medications at facility. [facility] stated that no accepting physcians there at this time to reassess situation.   CSW spoke with [facility], unaware of conversation mentioned by pt's fiance. Will re-evaluate situation in the morning.   Wendelyn Breslow, [facility]  [phone]

## 2017-11-21 NOTE — Progress Notes (Signed)
Discharge note: Patient reviewed discharge paperwork with RN including prescriptions, follow up appointments, and lab work. Patient given the opportunity to ask questions. All concerns were addressed. All belongings were returned to patient. Denied SI/HI/AVH. Patient thanked staff for their care while at the hospital.  Patient was discharged to lobby where his girlfriend was waiting to pick him up.

## 2017-11-22 ENCOUNTER — Emergency Department (HOSPITAL_COMMUNITY)
Admission: EM | Admit: 2017-11-22 | Discharge: 2017-11-22 | Disposition: A | Discharge status: Other Acute Inpatient Hospital | Payer: BLUE CROSS/BLUE SHIELD | Attending: Emergency Medicine | Admitting: Emergency Medicine

## 2017-11-22 ENCOUNTER — Other Ambulatory Visit: Payer: Self-pay

## 2017-11-22 ENCOUNTER — Encounter (HOSPITAL_COMMUNITY): Payer: Self-pay

## 2017-11-22 ENCOUNTER — Encounter (HOSPITAL_COMMUNITY): Payer: Self-pay | Admitting: Emergency Medicine

## 2017-11-22 ENCOUNTER — Inpatient Hospital Stay (HOSPITAL_COMMUNITY)
Admission: AD | Admit: 2017-11-22 | Discharge: 2017-11-26 | DRG: 885 | Payer: BLUE CROSS/BLUE SHIELD | Source: Intra-hospital | Attending: Psychiatry | Admitting: Psychiatry

## 2017-11-22 DIAGNOSIS — F322 Major depressive disorder, single episode, severe without psychotic features: Secondary | ICD-10-CM | POA: Insufficient documentation

## 2017-11-22 DIAGNOSIS — I129 Hypertensive chronic kidney disease with stage 1 through stage 4 chronic kidney disease, or unspecified chronic kidney disease: Secondary | ICD-10-CM | POA: Diagnosis not present

## 2017-11-22 DIAGNOSIS — F332 Major depressive disorder, recurrent severe without psychotic features: Principal | ICD-10-CM | POA: Diagnosis present

## 2017-11-22 DIAGNOSIS — R45851 Suicidal ideations: Secondary | ICD-10-CM | POA: Diagnosis present

## 2017-11-22 DIAGNOSIS — Z915 Personal history of self-harm: Secondary | ICD-10-CM | POA: Diagnosis not present

## 2017-11-22 DIAGNOSIS — M549 Dorsalgia, unspecified: Secondary | ICD-10-CM | POA: Diagnosis present

## 2017-11-22 DIAGNOSIS — Z801 Family history of malignant neoplasm of trachea, bronchus and lung: Secondary | ICD-10-CM

## 2017-11-22 DIAGNOSIS — F1410 Cocaine abuse, uncomplicated: Secondary | ICD-10-CM | POA: Diagnosis present

## 2017-11-22 DIAGNOSIS — F1920 Other psychoactive substance dependence, uncomplicated: Secondary | ICD-10-CM | POA: Diagnosis present

## 2017-11-22 DIAGNOSIS — F17210 Nicotine dependence, cigarettes, uncomplicated: Secondary | ICD-10-CM | POA: Insufficient documentation

## 2017-11-22 DIAGNOSIS — N183 Chronic kidney disease, stage 3 (moderate): Secondary | ICD-10-CM | POA: Insufficient documentation

## 2017-11-22 DIAGNOSIS — I10 Essential (primary) hypertension: Secondary | ICD-10-CM | POA: Diagnosis present

## 2017-11-22 DIAGNOSIS — F1120 Opioid dependence, uncomplicated: Secondary | ICD-10-CM | POA: Diagnosis present

## 2017-11-22 DIAGNOSIS — G8929 Other chronic pain: Secondary | ICD-10-CM | POA: Diagnosis present

## 2017-11-22 DIAGNOSIS — Z818 Family history of other mental and behavioral disorders: Secondary | ICD-10-CM | POA: Diagnosis not present

## 2017-11-22 DIAGNOSIS — Z79899 Other long term (current) drug therapy: Secondary | ICD-10-CM | POA: Insufficient documentation

## 2017-11-22 LAB — ETHANOL: Alcohol, Ethyl (B): <10 mg/dL (ref <10)

## 2017-11-22 LAB — CBC
HCT: 37 % — ABNORMAL LOW (ref 39.0–52.0)
HEMOGLOBIN: 11.1 g/dL — AB (ref 13.0–17.0)
MCH: 25.3 pg — ABNORMAL LOW (ref 26.0–34.0)
MCHC: 30 g/dL (ref 30.0–36.0)
MCV: 84.5 fL (ref 78.0–100.0)
PLATELETS: 265 10*3/uL (ref 150–400)
RBC: 4.38 MIL/uL (ref 4.22–5.81)
RDW: 16.9 % — ABNORMAL HIGH (ref 11.5–15.5)
WBC: 5 10*3/uL (ref 4.0–10.5)

## 2017-11-22 LAB — COMPREHENSIVE METABOLIC PANEL
ALBUMIN: 4.7 g/dL (ref 3.5–5.0)
ALT: 88 U/L — ABNORMAL HIGH (ref 0–44)
ANION GAP: 11 (ref 5–15)
AST: 40 U/L (ref 15–41)
Alkaline Phosphatase: 39 U/L (ref 38–126)
BUN: 13 mg/dL (ref 6–20)
CHLORIDE: 103 mmol/L (ref 98–111)
CO2: 25 mmol/L (ref 22–32)
Calcium: 9.5 mg/dL (ref 8.9–10.3)
Creatinine, Ser: 1.6 mg/dL — ABNORMAL HIGH (ref 0.61–1.24)
GFR calc non Af Amer: 49 mL/min — ABNORMAL LOW (ref >60)
GFR, EST AFRICAN AMERICAN: 56 mL/min — AB (ref >60)
Glucose, Bld: 98 mg/dL (ref 70–99)
Potassium: 4 mmol/L (ref 3.5–5.1)
Sodium: 139 mmol/L (ref 135–145)
Total Bilirubin: 1.5 mg/dL — ABNORMAL HIGH (ref 0.3–1.2)
Total Protein: 6.7 g/dL (ref 6.5–8.1)

## 2017-11-22 LAB — RAPID URINE DRUG SCREEN, HOSP PERFORMED
AMPHETAMINES: NOT DETECTED
Barbiturates: NOT DETECTED
Benzodiazepines: NOT DETECTED
COCAINE: NOT DETECTED
OPIATES: POSITIVE — AB
TETRAHYDROCANNABINOL: NOT DETECTED

## 2017-11-22 LAB — ACETAMINOPHEN LEVEL

## 2017-11-22 LAB — SALICYLATE LEVEL

## 2017-11-22 MED ORDER — FOLIC ACID 1 MG PO TABS
1.0000 mg | ORAL_TABLET | Freq: Every day | ORAL | Status: DC
  Administered 2017-11-22: 1 mg via ORAL
  Filled 2017-11-22: qty 1

## 2017-11-22 MED ORDER — SENNOSIDES-DOCUSATE SODIUM 8.6-50 MG PO TABS
1.0000 | ORAL_TABLET | Freq: Two times a day (BID) | ORAL | Status: DC
  Administered 2017-11-26: 1 via ORAL
  Filled 2017-11-22 (×10): qty 1

## 2017-11-22 MED ORDER — TRAZODONE HCL 100 MG PO TABS
100.0000 mg | ORAL_TABLET | Freq: Every evening | ORAL | Status: DC | PRN
  Administered 2017-11-22: 100 mg via ORAL
  Filled 2017-11-22 (×8): qty 1

## 2017-11-22 MED ORDER — ONDANSETRON HCL 4 MG PO TABS: 4.0000 mg | ORAL_TABLET | Freq: Three times a day (TID) | ORAL | Status: DC | PRN

## 2017-11-22 MED ORDER — GABAPENTIN 100 MG PO CAPS
100.0000 mg | ORAL_CAPSULE | Freq: Three times a day (TID) | ORAL | Status: DC
  Administered 2017-11-22: 100 mg via ORAL
  Filled 2017-11-22: qty 1

## 2017-11-22 MED ORDER — NAPHAZOLINE-PHENIRAMINE 0.025-0.3 % OP SOLN
1.0000 [drp] | Freq: Four times a day (QID) | OPHTHALMIC | Status: DC | PRN
  Filled 2017-11-22: qty 15

## 2017-11-22 MED ORDER — HYDROCODONE-ACETAMINOPHEN 10-325 MG PO TABS
1.0000 | ORAL_TABLET | ORAL | Status: DC | PRN
  Administered 2017-11-22 – 2017-11-26 (×19): 1 via ORAL
  Filled 2017-11-22 (×20): qty 1

## 2017-11-22 MED ORDER — FOLIC ACID 1 MG PO TABS
1.0000 mg | ORAL_TABLET | Freq: Every day | ORAL | Status: DC
  Administered 2017-11-23 – 2017-11-26 (×4): 1 mg via ORAL
  Filled 2017-11-22 (×5): qty 1

## 2017-11-22 MED ORDER — FERROUS SULFATE 325 (65 FE) MG PO TABS
325.0000 mg | ORAL_TABLET | Freq: Every day | ORAL | Status: DC
  Administered 2017-11-23 – 2017-11-26 (×4): 325 mg via ORAL
  Filled 2017-11-22 (×5): qty 1

## 2017-11-22 MED ORDER — VORTIOXETINE HBR 10 MG PO TABS
10.0000 mg | ORAL_TABLET | Freq: Every day | ORAL | Status: DC
  Administered 2017-11-22: 10 mg via ORAL
  Filled 2017-11-22: qty 1

## 2017-11-22 MED ORDER — NICOTINE 21 MG/24HR TD PT24
21.0000 mg | MEDICATED_PATCH | Freq: Every day | TRANSDERMAL | Status: DC
  Administered 2017-11-23 – 2017-11-26 (×4): 21 mg via TRANSDERMAL
  Filled 2017-11-22 (×5): qty 1

## 2017-11-22 MED ORDER — ACETAMINOPHEN 325 MG PO TABS: 650.0000 mg | ORAL_TABLET | ORAL | Status: DC | PRN

## 2017-11-22 MED ORDER — AMLODIPINE BESYLATE 5 MG PO TABS
5.0000 mg | ORAL_TABLET | Freq: Every day | ORAL | Status: DC
  Administered 2017-11-23 – 2017-11-26 (×4): 5 mg via ORAL
  Filled 2017-11-22 (×5): qty 1

## 2017-11-22 MED ORDER — FERROUS SULFATE 325 (65 FE) MG PO TABS
325.0000 mg | ORAL_TABLET | Freq: Every day | ORAL | Status: DC
  Administered 2017-11-22: 325 mg via ORAL
  Filled 2017-11-22 (×2): qty 1

## 2017-11-22 MED ORDER — SENNOSIDES-DOCUSATE SODIUM 8.6-50 MG PO TABS
1.0000 | ORAL_TABLET | Freq: Two times a day (BID) | ORAL | Status: DC
  Filled 2017-11-22 (×2): qty 1

## 2017-11-22 MED ORDER — AMLODIPINE BESYLATE 5 MG PO TABS
5.0000 mg | ORAL_TABLET | Freq: Every day | ORAL | Status: DC
  Administered 2017-11-22: 5 mg via ORAL
  Filled 2017-11-22: qty 1

## 2017-11-22 MED ORDER — GABAPENTIN 100 MG PO CAPS
100.0000 mg | ORAL_CAPSULE | Freq: Three times a day (TID) | ORAL | Status: DC
  Administered 2017-11-22 – 2017-11-26 (×11): 100 mg via ORAL
  Filled 2017-11-22 (×14): qty 1

## 2017-11-22 MED ORDER — NAPHAZOLINE-PHENIRAMINE 0.025-0.3 % OP SOLN: 1.0000 [drp] | Freq: Four times a day (QID) | OPHTHALMIC | Status: DC | PRN

## 2017-11-22 MED ORDER — HYDROCODONE-ACETAMINOPHEN 10-325 MG PO TABS
1.0000 | ORAL_TABLET | ORAL | Status: DC | PRN
  Administered 2017-11-22 (×2): 1 via ORAL
  Filled 2017-11-22 (×2): qty 1

## 2017-11-22 MED ORDER — NICOTINE 21 MG/24HR TD PT24
21.0000 mg | MEDICATED_PATCH | Freq: Every day | TRANSDERMAL | Status: DC
  Administered 2017-11-22: 21 mg via TRANSDERMAL
  Filled 2017-11-22: qty 1

## 2017-11-22 MED ORDER — VORTIOXETINE HBR 10 MG PO TABS
10.0000 mg | ORAL_TABLET | Freq: Every day | ORAL | Status: DC
  Administered 2017-11-23 – 2017-11-26 (×4): 10 mg via ORAL
  Filled 2017-11-22 (×5): qty 1

## 2017-11-22 NOTE — Progress Notes (Addendum)
[facility]  Benjamin Rankin, NP is the accepting provider.  Benjamin MellowGreg Clary, MD, is the attending provider.  Call report to [phone]  @ [facility] notified.   Pt is Voluntary.  Pt may be transported by [facility]  Pt [facility]  [facility] as transport can be arranged.  Benjamin EulerJean T. Kaylyn [name], MSW, LCSWA Disposition Clinical Social Work [phone] (cell) [phone] (office)

## 2017-11-22 NOTE — Progress Notes (Signed)
Benjamin HaagensenJohn [name] is a 52 y.o. male. [facility] after receiving treatment for depression and polysubstance abuse. Per notes [facility] and had appointment for 10 am. Patient reports that he went to [facility] and was there for five hours. Stated that he completed their orientation and admission process and was then told that he could not stay due to his need for chronic pain medications.   [facility], but at that time there were no psychiatrists available to reassess his situation.  Patient examined via telepsych. Alert and oriented x 4, pleasant, and cooperative. Speech is clear and coherent. Mood is depressed and anxious and affect is congruent with mood. States that he is having some suicidal thoughts related to the stress of not being accepted at [facility] and not having secured placement at a long term substance abuse treatment facility. He denies any intent or plan at this time. States that he feels safe in the hospital, but does not feel safe leaving the hospital without having secured long term treatment. Denies AVH.  Recommend observation overnight. Plan for social work to follow up as indicated in notes. Peer support consult.   Continue Trintellix 10 mg daily for depression and Neurontin 100 mg three times a day for anxiety/agitation.

## 2017-11-22 NOTE — ED Notes (Signed)
Pt awaiting TTS with machine at bedsdie

## 2017-11-22 NOTE — ED Notes (Signed)
Long compression pants and socks removed from patient and placed with belongings.

## 2017-11-22 NOTE — ED Triage Notes (Signed)
Pt reports he is suicidal, states "I'm stressed out.... I'm surprised I didn't go back to were I was two weeks ago."  Pt attempted to end his life using heroin, cocaine and liquor two weeks ago.  No use since then.

## 2017-11-22 NOTE — ED Notes (Signed)
Pt departs stable with [facility] transport [facility] with all belongings. Pt ambulates easily with steady gait.

## 2017-11-22 NOTE — BH Assessment (Addendum)
Tele Assessment Note   Patient Name: Benjamin [name] MRN: [id number] Referring Physician: Dr. Dina Rich Location of Patient: F11C Location of Provider: [place] is an 52 y.o. male presenting with SI with no plan. Patient was discharged from [facility] on 11/21/17. Patient reported increased stress and anger as he went to arranged placement arrangement at [place] and was turned away after orientation, signing in, getting items checked in, meeting with psychiatrist and then doctor told him because of pain medications they can't keep him. Patient stated stated he became angry and suicidal. Patient then came back to [facility] emergency room.  Per NP note 11/22/17 3:40am Patient was discharged from [facility] on 11/21/17 after receiving treatment for depression and polysubstance abuse. Per notes patient was accepted at [facility] and had appointment for 10 am. Patient reports that he went to [facility] and was there for five hours. Stated that he completed their orientation and admission process and was then told that he could not stay due to his need for chronic pain medications.  CSW at [facility] contacted [facility], but at that time there were no psychiatrists available to reassess his situation. Patient examined via telepsych. Alert and oriented x 4, pleasant, and cooperative. Speech is clear and coherent. Mood is depressed and anxious and affect is congruent with mood. States that he is having some suicidal thoughts related to the stress of not being accepted at [facility] and not having secured placement at a long term substance abuse treatment facility. He denies any intent or plan at this time. States that he feels safe in the hospital, but does not feel safe leaving the hospital without having secured long term treatment. Denies AVH.  Per SW note 11/21/17 11:04pm CSW and RNCM met with pt, pt's fiance, and friend in the waiting room. Pt was discharged from [facility] this morning to go to  [facility] in [place]. Pt arrived there around 12. Pt transported by fiance and friend. Pt was there and accepted. Per pt, when pt was meeting with accepting physician, physician stated that pt cannot be accept due to chronic pain medications. Pt's fiance stated that she spoke with [facility] and was informed by [facility] that they had no beds for pt to return there, but pt could stay in the ED and return to [facility] tomorrow.  CSW called [facility]. CSW was informed that pt could not receive chronic pain medications at facility. [facility] stated that no accepting physcians there at this time to reassess situation.  CSW spoke with [facility], unaware of conversation mentioned by pt's fiance. Will re-evaluate situation in the morning.   Per MD note 11/12/17 Brief Narrative: Benjamin [name] a 52 y.o.malewith medical history significant ofchronic pain, HTN, and substance abuse was brought to ED, after he was found minimally responsive in the bed room of a hotel with feces. He reports taht he was partying with friends and occasionally uses cocaine. On arrival to he was hypotensive, tachycardic and in altered mental status. He was admitted for further evaluation.The patient has developed acute psychiatric changes and became suicidal and has been seen by psychiatry and recommended for inpatient behavioral health treatment.  Disposition Initial Assessment Completed for this Encounter: Yes  Lindon Romp, NP, recommends observation overnight. Plan for social work to follow up as indicated in notes. Peer support consult.    Diagnosis: Major Depressive Disorder  Past Medical History:  Past Medical History:  Diagnosis Date  . Chronic back pain    managed  [facility]  . CKD (chronic kidney disease), stage III ([place]) 10/2017  . Erectile dysfunction   . History of substance abuse   . Hx of suicide attempt   . Hypertension     Past Surgical History:  Procedure Laterality Date  . ANKLE SURGERY     right  .  APPLICATION OF WOUND VAC Right 08/17/2015   Procedure: APPLICATION OF WOUND VAC;  Surgeon: Roseanne Kaufman, MD;  Location: [place];  Service: Orthopedics;  Laterality: Right;  . HAND SURGERY     fracture, ORIG, teenage years, right.  . I&D EXTREMITY Right 08/10/2015   Procedure: IRRIGATION AND DEBRIDEMENT SKIN, SUBCUTANEOS TISSUE AND  MUSCLE, CARPAL TUNNEL RELEASE, MEDIAN NERVE LYSIS WITH NERVE WRAP;  Surgeon: Roseanne Kaufman, MD;  Location: [facility];  Service: Orthopedics;  Laterality: Right;  . INCISION AND DRAINAGE OF WOUND Right 08/15/2015   Procedure: IRRIGATION AND DEBRIDEMENT WOUND;  Surgeon: Justice Britain, MD;  Location: [facility];  Service: Orthopedics;  Laterality: Right;  . LUMBAR EPIDURAL INJECTION     [facility]  . SKIN SPLIT GRAFT Right 08/17/2015   Procedure: I&D RIGHT FOREARM/POSSIBLE SKIN GRAFT;  Surgeon: Roseanne Kaufman, MD;  Location: [facility];  Service: Orthopedics;  Laterality: Right;  . TENDON REPAIR N/A 07/24/2015   Procedure: WRIST LACERATION AND TENDON REPAIR;  Surgeon: Iran Planas, MD;  Location: [facility] [place];  Service: Orthopedics;  Laterality: N/A;  . WOUND EXPLORATION Right 08/15/2015   Procedure: WOUND EXPLORATION right forearm;  Surgeon: Justice Britain, MD;  Location: [facility];  Service: Orthopedics;  Laterality: Right;    Family History:  Family History  Problem Relation Age of Onset  . Cancer Mother        lung  . Anxiety disorder Mother   . Cancer Father        throat  . Diabetes Paternal Grandmother   . Cancer Paternal Aunt   . Heart disease Neg Hx   . Stroke Neg Hx     Social History:  reports that he has been smoking cigarettes. He has a 3.00 pack-year smoking history. He has never used smokeless tobacco. He reports that he drinks alcohol. He reports that he has current or past drug history. Drugs: Marijuana, Cocaine, and Heroin.  Additional Social History:  Alcohol / Drug Use Pain Medications: see MAR Prescriptions: see MAR Over the Counter: see  MAR  CIWA: CIWA-Ar BP: (!) 172/103 Pulse Rate: 67 COWS:    Allergies: No Known Allergies  Home Medications:  (Not in a hospital admission)  OB/GYN Status:  No LMP for male patient.  General Assessment Data Location of Assessment: [facility] ED TTS Assessment: In system Is this a Tele or Face-to-Face Assessment?: Tele Assessment Is this an Initial Assessment or a Re-assessment for this encounter?: Initial Assessment Marital status: Single Is patient pregnant?: No Pregnancy Status: No Living Arrangements: Spouse/significant other Can pt return to current living arrangement?: Yes Admission Status: Voluntary Is patient capable of signing voluntary admission?: Yes Referral Source: Self/Family/Friend     Crisis Care Plan Living Arrangements: Spouse/significant other Legal Guardian: (self) Name of Psychiatrist: none(none) Name of Therapist: none  Education Status Is patient currently in school?: No Is the patient employed, unemployed or receiving disability?: Unemployed  Risk to self with the past 6 months Suicidal Ideation: Yes-Currently Present Has patient been a risk to self within the past 6 months prior to admission? : Yes Suicidal Intent: Yes-Currently Present Has patient had any suicidal intent within the  past 6 months prior to admission? : Yes Is patient at risk for suicide?: Yes Suicidal Plan?: Yes-Currently Present(Cut self) Has patient had any suicidal plan within the past 6 months prior to admission? : Yes Specify Current Suicidal Plan: (Cut wrist) Access to Means: No Specify Access to Suicidal Means: (knives in kitchen) What has been your use of drugs/alcohol within the last 12 months?: (alcohol, cocaine and heroin) Previous Attempts/Gestures: Yes How many times?: (1) Triggers for Past Attempts: (grief loss and pain management) Intentional Self Injurious Behavior: None Family Suicide History: Unknown Recent stressful life event(s): (increased depression and pain  management) Persecutory voices/beliefs?: No Depression: Yes Depression Symptoms: Insomnia, Isolating, Loss of interest in usual pleasures, Feeling worthless/self pity Substance abuse history and/or treatment for substance abuse?: (unknown)  Risk to Others within the past 6 months Homicidal Ideation: No Does patient have any lifetime risk of violence toward others beyond the six months prior to admission? : No Thoughts of Harm to Others: No Current Homicidal Intent: No Current Homicidal Plan: No Access to Homicidal Means: No Identified Victim: (n/q) History of harm to others?: No Assessment of Violence: None Noted Violent Behavior Description: (none) Does patient have access to weapons?: No Criminal Charges Pending?: No Does patient have a court date: No Is patient on probation?: No  Psychosis Hallucinations: Auditory Delusions: None noted  Mental Status Report Appearance/Hygiene: Unremarkable Eye Contact: Good Motor Activity: Freedom of movement, Unremarkable Speech: Logical/coherent Level of Consciousness: Alert Mood: Depressed, Sad, Helpless, Despair Affect: Appropriate to circumstance Anxiety Level: Minimal Thought Processes: Coherent Judgement: Impaired Orientation: Person, Place, Time, Situation, Appropriate for developmental age Obsessive Compulsive Thoughts/Behaviors: None  Cognitive Functioning Concentration: Good Memory: Recent Intact, Remote Intact Is patient IDD: No Is patient DD?: No Insight: Fair Impulse Control: Fair Appetite: Good Have you had any weight changes? : No Change Sleep: (little hours) Total Hours of Sleep: (4) Vegetative Symptoms: None  ADLScreening [facility] Services) Patient's cognitive ability adequate to safely complete daily activities?: Yes Patient able to express need for assistance with ADLs?: Yes Independently performs ADLs?: Yes (appropriate for developmental age)  Prior Inpatient Therapy Prior Inpatient Therapy:  No  Prior Outpatient Therapy Prior Outpatient Therapy: No Does patient have an ACCT team?: No Does patient have Intensive In-House Services?  : No Does patient have [facility] services? : No Does patient have P4CC services?: No  ADL Screening (condition at time of admission) Patient's cognitive ability adequate to safely complete daily activities?: Yes Patient able to express need for assistance with ADLs?: Yes Independently performs ADLs?: Yes (appropriate for developmental age)                        Disposition:  Disposition Initial Assessment Completed for this Encounter: Yes  Lindon Romp, NP, recommends observation overnight. Plan for social work to follow up as indicated in notes. Peer support consult.    This service was provided via telemedicine using a 2-way, interactive audio and video technology.  Names of all persons participating in this telemedicine service and their role in this encounter. Name: Benjamin [name] Role: patient  Name: Kirtland Bouchard, [facility] Role: TTS Clinician  Name: Lindon Romp, NP Role: NP  Name:  Role:     Venora Maples 11/22/2017 4:31 AM

## 2017-11-22 NOTE — Progress Notes (Signed)
Disposition CSW reviewed patient chart and his referral and subsequent denial for [facility] [place].  CSW conferred with [facility], Ethel RanaHeather S, [place], who made made the referral on 11/20/17 as part of patient's discharge plan.  [facility] CSW advised that she spoke to Amy in the Admissions office at [facility] and gave her all pertinent information, including the fact that patient has long-term use of Norco for chronic back pain.  [facility] Physician Extender, Assunta FoundShuvon Rankin, NP, looked patient up in the Federal drug registry and found that he has only sought treatment from one provider and seemed to be requesting prescriptions appropriately since 2017.   This Clinical research associatewriter contacted Amy at [facility], [phone], and she explained that [facility] accepts patients "on a case by case basis".  Patient was apparently seen by the facility's psychiatrist prior to being medically assessed.  Per Amy, patient "expressed some skepticism regarding treatment" when talking to the psychiatrist.  She also related that patient was offered an alternative to the Norco he is currently prescribed (she believed it was another opiate pain killer), and he declined.  Apparently the combined factors led them to decline admission.    [facility] Physican Extender given this information prior to assessing this patient.  Timmothy EulerJean T. Kaylyn LimSutter, MSW, LCSWA Disposition Clinical Social Work [phone] (cell) [phone] (office)

## 2017-11-22 NOTE — ED Notes (Signed)
Patients belongings inventoried and in locker 3

## 2017-11-22 NOTE — ED Notes (Signed)
Pt resting with eyes closed respirations even and non labored. Sitter at door.

## 2017-11-22 NOTE — ED Notes (Signed)
Pt speaking on phone with POA

## 2017-11-22 NOTE — Consult Note (Signed)
  Tele psych Assessment   Tele Assessment   Benjamin HaagensenJohn [name], 52 y.o., male [facility] with complaints of suicidal ideation and no plan.  Patient seen via telepsych by this provider; chart reviewed and consulted with Dr. Lucianne MussKumar on 11/22/17.  [facility]; patient was accept to  [facility] and was to be there at 10 AM.  Patient reported that he went to [facility] and was there for 5 hours; after orientation he was told he was not accepted related for his need for chronic pain medications.  Social worker Carney BernJean spoke with Admission at [facility] and was informed that patient was offered another pain medication which he refused and patient had also had some skepticism regarding treatment.  States that if patient is interested he would have to start at the beginning of process and that he would have to take and alternative pain medication.    On evaluation Benjamin HaagensenJohn [name] reports he felt good after [facility] he was moving forward.  States he became upset after being at [facility] for 5 hours telling him he was accepted and then telling him he couldn't stay.  States he was not offered an alternative until he got upset and went off and then it was for just one night.  States he was already upset.  Patient has a long history of impulsive suicidal attempts and patients last attempt he was found in motel room by maid after cutting himself.  Patient states that he does not feel safe enough to go home and he is still having suicidal ideation.     During evaluation Benjamin HaagensenJohn [name] is alert/oriented x 4; calm/cooperative; and mood congruent with affect.  He does not appear to be responding to internal/external stimuli or delusional thoughts.  Patient denied homicidal ideation, psychosis, and paranoia; but is unable to contract for safety.  Patient answered question appropriately.  Spoke with Dr. Tamera Puntleary and he has no problem with patient being [facility] if there is a bed  available.      Recommendations:  Inpatient psychiatric treatment  Disposition: Recommend psychiatric Inpatient admission when medically cleared.    Assunta FoundShuvon Rankin, NP

## 2017-11-22 NOTE — ED Notes (Signed)
TTS in progress 

## 2017-11-22 NOTE — ED Notes (Signed)
Pt eating breakfast and tolerating well.

## 2017-11-22 NOTE — ED Notes (Signed)
[facility] called for Transport. 

## 2017-11-22 NOTE — Progress Notes (Addendum)
CSW spoke with pt's girlfriend, Benjamin [name] [phone]) regarding [facility] and [facility]. [name] is working to Optician, dispensingcontact [facility] and [facility] regarding pt being admitted to one of these programs after hospitalization. CSW informed her that pt will likely not be accepted inpatient due to his need for narcotic pain medication and [facility] ago. He was also declined by the psychiatrist at [facility] when he arrived to be admitted (per chart note, pt was offered alternative pain medication and had declined; then reported skepticism regarding program. The admitting MD decided to decline admission at that point). Pt was following up at [facility] prior to hospitalization and may return there for medication management and IOP as well.  Marlo Arriola S. Alan RipperHolloway, MSW, LCSW Clinical Social Worker 11/22/2017 3:28 PM

## 2017-11-22 NOTE — Tx Team (Signed)
Initial Treatment Plan 11/22/2017 3:19 PM Benjamin HaagensenJohn [name] ZOX:[id number]    PATIENT STRESSORS: Financial difficulties Health problems Substance abuse   PATIENT STRENGTHS: Capable of independent living Communication skills Supportive family/friends   PATIENT IDENTIFIED PROBLEMS: "to build up self esteem"  "Stay on the right medications"  Depression  Substance Abuse  Suicidal thoughts             DISCHARGE CRITERIA:  Ability to meet basic life and health needs Adequate post-discharge living arrangements Verbal commitment to aftercare and medication compliance  PRELIMINARY DISCHARGE PLAN: Attend aftercare/continuing care group Outpatient therapy Return to previous living arrangement  PATIENT/FAMILY INVOLVEMENT: This treatment plan has been presented to and reviewed with the patient, Benjamin HaagensenJohn [name], and/or family member.  The patient and family have been given the opportunity to ask questions and make suggestions.  Clarene CritchleyElizabeth O Duwayne Matters, RN 11/22/2017, 3:19 PM

## 2017-11-22 NOTE — ED Notes (Signed)
Benjamin ConnJason Berry, NP, recommends observation overnight. Plan for social work to follow up as indicated in notes. Peer support consult.

## 2017-11-22 NOTE — ED Notes (Signed)
Pt eating lunch and tollerating well

## 2017-11-22 NOTE — ED Provider Notes (Signed)
[facility] EMERGENCY DEPARTMENT Provider Note   CSN: [id number] Arrival date & time: 11/22/17  0014     History   Chief Complaint Chief Complaint  Patient presents with  . Suicidal    HPI Sotirios Navarro is a 52 y.o. male.  The history is provided by the patient and medical records.    52 year old male with history of chronic back pain, chronic kidney disease, history of polysubstance abuse, hypertension, presenting to the ED with suicidal ideation.  Patient was discharged from behavioral health on 11/20/2017.  It was set up for him to go to [facility] to do prolonged inpatient therapy.  States yesterday he was there for a few hours and did not even finish the intake when called family to come pick him up as they reported they can "not manage his chronic pain and opioids".  Patient and family are somewhat frustrated as they were told this is 1 of the facilities that can handle that.  Patient reports some continued suicidal ideation and feelings of depression.  States in the past month he has been started/stopped on 7 or 8 different psychiatric medications which he reports did make him feel "out of whack".  States it has improved somewhat at this time but he still does not feel back to baseline.  He has not attempted to harm himself intentionally, but has cut himself and overdose on drugs in the past including incidence 2 weeks ago.  He denies any homicidal ideation.  No hallucination.  His only physical complaint at this time is some ongoing low back pain.  He does take hydrocodone for this and because of the situation earlier today at [facility] facility he was not given any of his medications.  No change in his pain or new injuries, just needs his medications. He denies any numbness or weakness of his legs.  No bowel or bladder incontinence.  Past Medical History:  Diagnosis Date  . Chronic back pain    managed by [facility]  . CKD (chronic kidney disease), stage  III (HCC) 10/2017  . Erectile dysfunction   . History of substance abuse   . Hx of suicide attempt   . Hypertension     Patient Active Problem List   Diagnosis Date Noted  . Polysubstance dependence including opioid drug with daily use (HCC) 11/14/2017  . Suicidal ideation 11/13/2017  . Anxiety and depression   . Acute renal failure (HCC)   . Altered mental status   . Cocaine use disorder (HCC)   . AKI (acute kidney injury) (HCC) 11/07/2017  . Leukocytosis 11/07/2017  . Chronic kidney disease (CKD), stage III (moderate) (HCC) 09/19/2016  . Elevated LFTs 09/19/2016  . Major depressive disorder, recurrent episode, severe (HCC) 08/24/2015  . Insomnia   . MDD (major depressive disorder), single episode, severe , no psychosis (HCC) 08/14/2015  . Cannabis use disorder, mild, abuse 08/14/2015  . Cocaine abuse (HCC) 08/10/2015  . Weight loss, non-intentional 08/10/2015  . Depression 08/10/2015  . Essential hypertension   . Vaccine refused by patient 05/24/2015  . Routine general medical examination at a health care facility 05/24/2015  . Chronic back pain 05/24/2015  . Family history of cancer 05/24/2015  . Screening for prostate cancer 05/24/2015  . Smoker 05/24/2015  . Screen for colon cancer 05/24/2015  . Erectile dysfunction 01/31/2014    Past Surgical History:  Procedure Laterality Date  . ANKLE SURGERY     right  . APPLICATION OF WOUND VAC  Right 08/17/2015   Procedure: APPLICATION OF WOUND VAC;  Surgeon: Dominica SeverinWilliam Gramig, MD;  Location: [facility] OR;  Service: Orthopedics;  Laterality: Right;  . HAND SURGERY     fracture, ORIG, teenage years, right.  . I&D EXTREMITY Right 08/10/2015   Procedure: IRRIGATION AND DEBRIDEMENT SKIN, SUBCUTANEOS TISSUE AND  MUSCLE, CARPAL TUNNEL RELEASE, MEDIAN NERVE LYSIS WITH NERVE WRAP;  Surgeon: Dominica SeverinWilliam Gramig, MD;  Location: [facility] OR;  Service: Orthopedics;  Laterality: Right;  . INCISION AND DRAINAGE OF WOUND Right 08/15/2015   Procedure: IRRIGATION  AND DEBRIDEMENT WOUND;  Surgeon: Francena HanlyKevin Supple, MD;  Location: [facility] ORS;  Service: Orthopedics;  Laterality: Right;  . LUMBAR EPIDURAL INJECTION     [facility]  . SKIN SPLIT GRAFT Right 08/17/2015   Procedure: I&D RIGHT FOREARM/POSSIBLE SKIN GRAFT;  Surgeon: Dominica SeverinWilliam Gramig, MD;  Location: [facility] OR;  Service: Orthopedics;  Laterality: Right;  . TENDON REPAIR N/A 07/24/2015   Procedure: WRIST LACERATION AND TENDON REPAIR;  Surgeon: Bradly BienenstockFred Ortmann, MD;  Location: [facility] OR;  Service: Orthopedics;  Laterality: N/A;  . WOUND EXPLORATION Right 08/15/2015   Procedure: WOUND EXPLORATION right forearm;  Surgeon: Francena HanlyKevin Supple, MD;  Location: [facility];  Service: Orthopedics;  Laterality: Right;        Home Medications    Prior to Admission medications   Medication Sig Start Date End Date Taking? Authorizing Provider  amLODipine (NORVASC) 5 MG tablet Take 1 tablet (5 mg total) by mouth daily. For high blood pressure 11/21/17   Armandina StammerNwoko, Agnes I, NP  ferrous sulfate 325 (65 FE) MG tablet Take 1 tablet (325 mg total) by mouth daily with breakfast. For anemia 11/20/17 12/20/17  Armandina StammerNwoko, Agnes I, NP  folic acid (FOLVITE) 1 MG tablet Take 1 tablet (1 mg total) by mouth daily. For folate deficiency 11/20/17   Armandina StammerNwoko, Agnes I, NP  gabapentin (NEURONTIN) 100 MG capsule Take 1 capsule (100 mg total) by mouth 3 (three) times daily. Agitation 11/20/17   Armandina StammerNwoko, Agnes I, NP  HYDROcodone-acetaminophen (NORCO) 10-325 MG tablet Take 1 tablet by mouth every 4 (four) hours as needed for severe pain. 11/20/17   Armandina StammerNwoko, Agnes I, NP  naphazoline-pheniramine (NAPHCON-A) 0.025-0.3 % ophthalmic solution Place 1 drop into both eyes 4 (four) times daily as needed for eye irritation. 11/20/17   Armandina StammerNwoko, Agnes I, NP  nicotine (NICODERM CQ - DOSED IN MG/24 HOURS) 21 mg/24hr patch Place 1 patch (21 mg total) onto the skin daily as needed (nicotine craving). 11/20/17   Armandina StammerNwoko, Agnes I, NP  senna-docusate (SENOKOT-S) 8.6-50 MG tablet Take 1 tablet by  mouth 2 (two) times daily. For constipation 11/20/17   Armandina StammerNwoko, Agnes I, NP  vortioxetine HBr (TRINTELLIX) 10 MG TABS tablet Take 1 tablet (10 mg total) by mouth daily. For depression 11/21/17   Sanjuana KavaNwoko, Agnes I, NP    Family History Family History  Problem Relation Age of Onset  . Cancer Mother        lung  . Anxiety disorder Mother   . Cancer Father        throat  . Diabetes Paternal Grandmother   . Cancer Paternal Aunt   . Heart disease Neg Hx   . Stroke Neg Hx     Social History Social History   Tobacco Use  . Smoking status: Current Every Day Smoker    Packs/day: 0.50    Years: 6.00    Pack years: 3.00    Types: Cigarettes  . Smokeless tobacco: Never Used  Substance  Use Topics  . Alcohol use: Yes    Alcohol/week: 0.0 standard drinks    Comment: occasional  . Drug use: Yes    Types: Marijuana, Cocaine, Heroin    Comment: last use cocaine 08/08/15, last use MJ 08/08/15     Allergies   Patient has no known allergies.   Review of Systems Review of Systems  Musculoskeletal: Positive for back pain.  Psychiatric/Behavioral: Positive for suicidal ideas.  All other systems reviewed and are negative.    Physical Exam Updated Vital Signs BP (!) 172/103 (BP Location: Right Arm)   Pulse 67   Temp 98.1 F (36.7 C) (Oral)   Resp 18   SpO2 100%   Physical Exam  Constitutional: He is oriented to person, place, and time. He appears well-developed and well-nourished.  HENT:  Head: Normocephalic and atraumatic.  Mouth/Throat: Oropharynx is clear and moist.  Eyes: Pupils are equal, round, and reactive to light. Conjunctivae and EOM are normal.  Neck: Normal range of motion.  Cardiovascular: Normal rate, regular rhythm and normal heart sounds.  Pulmonary/Chest: Effort normal and breath sounds normal. No stridor. No respiratory distress.  Abdominal: Soft. Bowel sounds are normal. There is no tenderness. There is no rebound.  Musculoskeletal: Normal range of motion.    Moving legs well without difficulty, no apparent strength/sensory deficit, normal gait  Neurological: He is alert and oriented to person, place, and time.  Skin: Skin is warm and dry.  Psychiatric: He has a normal mood and affect. He is not actively hallucinating. He expresses suicidal ideation. He expresses no homicidal ideation. He expresses no homicidal plans.  Nursing note and vitals reviewed.    ED Treatments / Results  Labs (all labs ordered are listed, but only abnormal results are displayed) Labs Reviewed  COMPREHENSIVE METABOLIC PANEL - Abnormal; Notable for the following components:      Result Value   Creatinine, Ser 1.60 (*)    ALT 88 (*)    Total Bilirubin 1.5 (*)    GFR calc non Af Amer 49 (*)    GFR calc Af Amer 56 (*)    All other components within normal limits  ACETAMINOPHEN LEVEL - Abnormal; Notable for the following components:   Acetaminophen (Tylenol), Serum <10 (*)    All other components within normal limits  CBC - Abnormal; Notable for the following components:   Hemoglobin 11.1 (*)    HCT 37.0 (*)    MCH 25.3 (*)    RDW 16.9 (*)    All other components within normal limits  RAPID URINE DRUG SCREEN, HOSP PERFORMED - Abnormal; Notable for the following components:   Opiates POSITIVE (*)    All other components within normal limits  ETHANOL  SALICYLATE LEVEL    EKG None  Radiology No results found.  Procedures Procedures (including critical care time)  Medications Ordered in ED Medications  nicotine (NICODERM CQ - dosed in mg/24 hours) patch 21 mg (has no administration in time range)  ondansetron (ZOFRAN) tablet 4 mg (has no administration in time range)  acetaminophen (TYLENOL) tablet 650 mg (has no administration in time range)  amLODipine (NORVASC) tablet 5 mg (has no administration in time range)  ferrous sulfate tablet 325 mg (has no administration in time range)  folic acid (FOLVITE) tablet 1 mg (has no administration in time range)   gabapentin (NEURONTIN) capsule 100 mg (has no administration in time range)  HYDROcodone-acetaminophen (NORCO) 10-325 MG per tablet 1 tablet (1 tablet Oral Given 11/22/17 0259)  naphazoline-pheniramine (NAPHCON-A) 0.025-0.3 % ophthalmic solution 1 drop (has no administration in time range)  senna-docusate (Senokot-S) tablet 1 tablet (1 tablet Oral Refused 11/22/17 0300)  vortioxetine HBr (TRINTELLIX) tablet 10 mg (has no administration in time range)     Initial Impression / Assessment and Plan / ED Course  I have reviewed the triage vital signs and the nursing notes.  Pertinent labs & imaging results that were available during my care of the patient were reviewed by me and considered in my medical decision making (see chart for details).  52 year old male here with suicidal ideation.  Was discharged from behavioral health and sent to [facility], however they sent him away from him prior to finishing his intake due to his chronic opiate use.  Family reports they were told previously this was not an issue.  They were sent back here for further management.  Patient reports SI without plan.  Denies HI/AVH.  Only reports chronic low back pain due to not having his meds all day.  No focal neurologic deficits.  Labs reassuring here.  Will get TTS consult.  TTS evaluated, recommends overnight observation.  Home meds ordered.   Final Clinical Impressions(s) / ED Diagnoses   Final diagnoses:  Suicidal ideation    ED Discharge Orders    None       Garlon Hatchet, PA-C 11/22/17 0602    Shon Baton, MD 11/22/17 [phone]

## 2017-11-22 NOTE — BH Assessment (Signed)
Admission Note: Patient is an [age] year old male admitted to the unit with symptoms of depression and agitation.  Voiced frustration of being declined at [facility] due to pain management.  Currently denies suicidal ideation while in the hospital.  Patient is alert and oriented x 4.  Presents with anxious affect and mood.  Admission plan of care reviewed and consent signed.  Skin assessment and personal belongings completed.  Skin is dry and intact.  No contraband found.  Patient is oriented to the unit, staff and room.  Routine safety checks initiated every 15 minutes.  Patient is safe on the unit.

## 2017-11-22 NOTE — ED Notes (Signed)
ED Provider at bedside. 

## 2017-11-22 NOTE — Progress Notes (Signed)
The patient verbalized that he had a good day overall and was pleased to have found out that he will be discharged on Monday to [facility]  His goal for tomorrow to work on things "day by day". He also mentioned that he is grateful for his wife's support.

## 2017-11-23 DIAGNOSIS — F332 Major depressive disorder, recurrent severe without psychotic features: Principal | ICD-10-CM

## 2017-11-23 DIAGNOSIS — F1920 Other psychoactive substance dependence, uncomplicated: Secondary | ICD-10-CM

## 2017-11-23 DIAGNOSIS — R45851 Suicidal ideations: Secondary | ICD-10-CM

## 2017-11-23 DIAGNOSIS — F17210 Nicotine dependence, cigarettes, uncomplicated: Secondary | ICD-10-CM

## 2017-11-23 MED ORDER — TRAZODONE HCL 50 MG PO TABS
50.0000 mg | ORAL_TABLET | Freq: Every evening | ORAL | Status: DC | PRN
  Administered 2017-11-23: 50 mg via ORAL
  Filled 2017-11-23 (×4): qty 1

## 2017-11-23 NOTE — Progress Notes (Signed)
Patient did not attend the evening speaker AA meeting. Pt was notified that group was beginning but returned to room reporting he needed to rest his back.

## 2017-11-23 NOTE — [facility] Group Notes (Signed)
LCSW Group Therapy Note  11/23/2017 1:15pm  Type of Therapy and Topic:  Group Therapy:  Feelings around Relapse and Recovery  Participation Level:  Active   Description of Group:    Patients in this group will discuss emotions they experience before and after a relapse. They will process how experiencing these feelings, or avoidance of experiencing them, relates to having a relapse. Facilitator will guide patients to explore emotions they have related to recovery. Patients will be encouraged to process which emotions are more powerful. They will be guided to discuss the emotional reaction significant others in their lives may have to their relapse or recovery. Patients will be assisted in exploring ways to respond to the emotions of others without this contributing to a relapse.  Therapeutic Goals: 1. Patient will identify two or more emotions that lead to a relapse for them 2. Patient will identify two emotions that result when they relapse 3. Patient will identify two emotions related to recovery 4. Patient will demonstrate ability to communicate their needs through discussion and/or role plays   Summary of Patient Progress:  Benjamin [name] was attentive and engaged during today's processing group. He shared that he experienced a mental health crisis after discharge due to not being admitted into [facility]. "I had to get back here safe because I did not feel well mentally." Benjamin [name] [facility] and is reporting feeling relieved and less anxious. "I'm feeling like I'm in a better place." Benjamin [name] continues to demonstrate progress in the group setting with improving insight."   Therapeutic Modalities:   Cognitive Behavioral Therapy Solution-Focused Therapy Assertiveness Training Relapse Prevention Therapy   Rona RavensHeather S Daveon Arpino, LCSW 11/23/2017 10:40 AM
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
## 2017-11-23 NOTE — Tx Team (Signed)
Interdisciplinary Treatment and Diagnostic Plan Update  11/23/2017 Time of Session: 0830AM Benjamin [name] MRN: [id number]  Principal Diagnosis: MDD, recurrent, severe  Secondary Diagnoses: Active Problems:   MDD (major depressive disorder), single episode, severe , no psychosis (HCC)   Current Medications:  Current Facility-Administered Medications  Medication Dose Route Frequency Provider Last Rate Last Dose  . acetaminophen (TYLENOL) tablet 650 mg  650 mg Oral Q4H PRN Rankin, Shuvon B, NP      . amLODipine (NORVASC) tablet 5 mg  5 mg Oral Daily Rankin, Shuvon B, NP   5 mg at 11/23/17 0819  . ferrous sulfate tablet 325 mg  325 mg Oral Q breakfast Rankin, Shuvon B, NP   325 mg at 11/23/17 0819  . folic acid (FOLVITE) tablet 1 mg  1 mg Oral Daily Rankin, Shuvon B, NP   1 mg at 11/23/17 0819  . gabapentin (NEURONTIN) capsule 100 mg  100 mg Oral TID Rankin, Shuvon B, NP   100 mg at 11/23/17 0819  . HYDROcodone-acetaminophen (NORCO) 10-325 MG per tablet 1 tablet  1 tablet Oral Q4H PRN Rankin, Shuvon B, NP   1 tablet at 11/23/17 0608  . naphazoline-pheniramine (NAPHCON-A) 0.025-0.3 % ophthalmic solution 1 drop  1 drop Both Eyes QID PRN Rankin, Shuvon B, NP      . nicotine (NICODERM CQ - dosed in mg/24 hours) patch 21 mg  21 mg Transdermal Daily Rankin, Shuvon B, NP   21 mg at 11/23/17 0820  . ondansetron (ZOFRAN) tablet 4 mg  4 mg Oral Q8H PRN Rankin, Shuvon B, NP      . senna-docusate (Senokot-S) tablet 1 tablet  1 tablet Oral BID Rankin, Shuvon B, NP      . traZODone (DESYREL) tablet 100 mg  100 mg Oral QHS,MR X 1 Nira Conn A, NP   100 mg at 11/22/17 2305  . vortioxetine HBr (TRINTELLIX) tablet 10 mg  10 mg Oral Daily Rankin, Shuvon B, NP   10 mg at 11/23/17 0820   PTA Medications: Medications Prior to Admission  Medication Sig Dispense Refill Last Dose  . amLODipine (NORVASC) 5 MG tablet Take 1 tablet (5 mg total) by mouth daily. For high blood pressure 30 tablet 0 11/22/2017 at Unknown  time  . ferrous sulfate 325 (65 FE) MG tablet Take 1 tablet (325 mg total) by mouth daily with breakfast. For anemia 30 tablet 0 11/22/2017 at Unknown time  . folic acid (FOLVITE) 1 MG tablet Take 1 tablet (1 mg total) by mouth daily. For folate deficiency 30 tablet 0 11/22/2017 at Unknown time  . gabapentin (NEURONTIN) 100 MG capsule Take 1 capsule (100 mg total) by mouth 3 (three) times daily. Agitation 90 capsule 0 11/22/2017 at Unknown time  . HYDROcodone-acetaminophen (NORCO) 10-325 MG tablet Take 1 tablet by mouth every 4 (four) hours as needed for severe pain. 30 tablet 0 11/22/2017 at Unknown time  . naphazoline-pheniramine (NAPHCON-A) 0.025-0.3 % ophthalmic solution Place 1 drop into both eyes 4 (four) times daily as needed for eye irritation. 15 mL 0 unk at prn  . nicotine (NICODERM CQ - DOSED IN MG/24 HOURS) 21 mg/24hr patch Place 1 patch (21 mg total) onto the skin daily as needed (nicotine craving). 28 patch 0 11/22/2017 at Unknown time  . senna-docusate (SENOKOT-S) 8.6-50 MG tablet Take 1 tablet by mouth 2 (two) times daily. For constipation (Patient not taking: Reported on 11/22/2017) 60 tablet 0 Not Taking at Unknown time  . vortioxetine HBr (TRINTELLIX) 10 MG  TABS tablet Take 1 tablet (10 mg total) by mouth daily. For depression 30 tablet 0 11/22/2017 at Unknown time    Patient Stressors: Financial difficulties Health problems Substance abuse  Patient Strengths: Capable of independent living Communication skills Supportive family/friends  Treatment Modalities: Medication Management, Group therapy, Case management,  1 to 1 session with clinician, Psychoeducation, Recreational therapy.   Physician Treatment Plan for Primary Diagnosis: MDD, recurrent, severe  Medication Management: Evaluate patient's response, side effects, and tolerance of medication regimen.  Therapeutic Interventions: 1 to 1 sessions, Unit Group sessions and Medication administration.  Evaluation of Outcomes:  Progressing  Physician Treatment Plan for Secondary Diagnosis: Active Problems:   MDD (major depressive disorder), single episode, severe , no psychosis (HCC)  Medication Management: Evaluate patient's response, side effects, and tolerance of medication regimen.  Therapeutic Interventions: 1 to 1 sessions, Unit Group sessions and Medication administration.  Evaluation of Outcomes: Progressing  RN Treatment Plan for Primary Diagnosis: MDD, recurrent, severe Long Term Goal(s): Knowledge of disease and therapeutic regimen to maintain health will improve  Short Term Goals: Ability to remain free from injury will improve, Ability to demonstrate self-control, Ability to disclose and discuss suicidal ideas and Ability to identify and develop effective coping behaviors will improve  Medication Management: RN will administer medications as ordered by provider, will assess and evaluate patient's response and provide education to patient for prescribed medication. RN will report any adverse and/or side effects to prescribing provider.  Therapeutic Interventions: 1 on 1 counseling sessions, Psychoeducation, Medication administration, Evaluate responses to treatment, Monitor vital signs and CBGs as ordered, Perform/monitor CIWA, COWS, AIMS and Fall Risk screenings as ordered, Perform wound care treatments as ordered.  Evaluation of Outcomes: Progressing   LCSW Treatment Plan for Primary Diagnosis: MDD, recurrent, severe Long Term Goal(s): Safe transition to appropriate next level of care at discharge, Engage patient in therapeutic group addressing interpersonal concerns.  Short Term Goals: Engage patient in aftercare planning with referrals and resources, Facilitate patient progression through stages of change regarding substance use diagnoses and concerns and Identify triggers associated with mental health/substance abuse issues  Therapeutic Interventions: Assess for all discharge needs, 1 to 1 time  with Social worker, Explore available resources and support systems, Assess for adequacy in community support network, Educate family and significant other(s) on suicide prevention, Complete Psychosocial Assessment, Interpersonal group therapy.  Evaluation of Outcomes: Progressing   Progress in Treatment: Attending groups: Yes. Participating in groups: Yes. Taking medication as prescribed: Yes. Toleration medication: Yes. Family/Significant other contact made: Yes, individual(s) contacted:  pt's girlfriend for collateral contact and SPE Patient understands diagnosis: Yes. Discussing patient identified problems/goals with staff: Yes. Medical problems stabilized or resolved: Yes. Denies suicidal/homicidal ideation: Yes. Issues/concerns per patient self-inventory: No. Other: n/a   New problem(s) identified: No, Describe:  n/a  New Short Term/Long Term Goal(s):  medication management for mood stabilization; elimination of SI thoughts; development of comprehensive mental wellness/sobriety plan.   Patient Goals:  "To get on the right meds and get into treatment."   Discharge Plan or Barriers: CSW assessing--pt chart indicates that he has been accepted to [facility] for treatment on Monday, 9/2. MHAG pamphlet, Mobile Crisis information, and AA/NA information provided to patient for additional community support and resources.   Reason for Continuation of Hospitalization: Anxiety Depression Medication stabilization  Medical issues--chronic pain management  Estimated Length of Stay: Monday, 11/26/17  Attendees: Patient: 11/23/2017 9:01 AM  Physician: Dr. Jola Babinski MD; Dr. Altamese Kerman MD 11/23/2017 9:01 AM  Nursing: Cicero Duck  Casimiro Needle; Michael RN 11/23/2017 9:01 AM  RN Care Manager:X 11/23/2017 9:01 AM  Social Worker: Corrie MckusickHeather Brock Larmon LCSW 11/23/2017 9:01 AM  Recreational Therapist: x 11/23/2017 9:01 AM  Other: Armandina StammerAgnes Nwoko NP; Gilda Creaseakia Starke NP 11/23/2017 9:01 AM  Other:  11/23/2017 9:01 AM  Other: 11/23/2017 9:01 AM     Scribe for Treatment Team: Rona RavensHeather S Othella Slappey, LCSW 11/23/2017 9:01 AM

## 2017-11-23 NOTE — [facility] Suicide Risk Assessment (Signed)
[facility] INPATIENT:  Family/Significant Other Suicide Prevention Education  Suicide Prevention Education:  Education Completed;  Clover MealyChrystina Canino, girlfriend [phone]) has been identified by the patient as the family member/significant other with whom the patientwill be residing, and identified as the person(s) who will aid the patient in the event of a mental health crisis (suicidal ideations/suicide attempt).  With written consent from the patient, the family member/significant other has been provided the following suicide prevention education, prior to the and/or following the discharge of the patient.  The suicide prevention education provided includes the following:  Suicide risk factors  Suicide prevention and interventions  National Suicide Hotline telephone number  [facility] assessment telephone number  [facility] Emergency Assistance 911  [facility] and/or Residential Mobile Crisis Unit telephone number  Request made of family/significant other to:  Remove weapons (e.g., guns, rifles, knives), all items previously/currently identified as safety concern.    Remove drugs/medications (over-the-counter, prescriptions, illicit drugs), all items previously/currently identified as a safety concern.  The family member/significant other verbalizes understanding of the suicide prevention education information provided.  The family member/significant other agrees to remove the items of safety concern listed above.  Maeola SarahJolan E Anaiz Qazi 11/23/2017, 10:42 AM

## 2017-11-23 NOTE — Progress Notes (Signed)
Pt has been visible on the unit this evening.  [facility], [facility] yesterday to [facility] to  [facility], but was not accepted because he could not continue taking his Norco while there.  He was readmitted here earlier today.  His previous orders were re-instated.  Previous shift reported that he received his Norco upon arrival on the unit.  He had another dose at the beginning of this shift prior to shift reports.  Writer was not able to speak with pt until later in the evening.  He was observed in the dayroom laughing and talking with peers.  He seemed to be moving freely and without pain.  He came to the NS at 2230 to ask for his Norco, but Clinical research associatewriter told him it was 30 minutes too early.  He seemed a little irritated, but was agreeable to wait.  He also asked about a sleep aid which he did not have one ordered, so writer spoke to the evening provider who put in an order for pt to get Trazodone 100 mg with a repeat dose prn.  Pt was agreeable at 2300 to the Trazodone and took it, along with the Norco at the proper time.  He went to bed after receiving the meds.  Support and encouragement offered.  Discharge plans are in process.  Safety maintained with q15 minute checks.

## 2017-11-23 NOTE — Progress Notes (Signed)
D: Patient denies SI, HI or AVH this morning. Patient presents as flat but pleasant and appropriate.  Pt. States that he slept well and reports that his appetite is good.  Pt. Is visualized in the dayroom interacting with staff and others.  Pt. Denies any needs and has no complaints other than his chronic pain.  A: Patient given emotional support from RN. Patient encouraged to come to staff with concerns and/or questions. Patient's medication routine continued. Patient's orders and plan of care reviewed.   R: Patient remains appropriate and cooperative. Will continue to monitor patient q15 minutes for safety.

## 2017-11-23 NOTE — [facility] Counselor (Signed)
CSW spoke with [facility] admissions coordinator, Jonny RuizJohn [phone]) regarding the patient being accepted fot residential treatment.   Per admissions, the patient has been accepted to the program on Monday, 11/26/2017.CSW explained to Jonny RuizJohn that [facility]:00am. The patient's discharge should be completed Sunday afternoon by 3:00pm to ensure a smooth discharge Monday morning.    CSW spoke with the patient's girlfriend, Clover MealyChrystina Canino [phone]) to update her on the patient's discharge plan. Per Chrystina she will pick the patient up on Monday, 11/26/17 by 9:00am to transport [facility].     Baldo DaubJolan Erabella Kuipers, MSW, LCSWA Clinical Social Worker [facility]  Phone: [phone]
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
## 2017-11-23 NOTE — H&P (Signed)
Psychiatric Admission Assessment Adult  Patient Identification: Benjamin [name] MRN:  [id number] Date of Evaluation:  11/23/2017 Chief Complaint:  MDD POLYSUBSTANCE USE DISORDER Principal Diagnosis: MDD (major depressive disorder), recurrent severe, without psychosis ([place]) Diagnosis:   Patient Active Problem List   Diagnosis Date Noted  . Polysubstance dependence including opioid drug with daily use ([place]) [F19.20] 11/14/2017  . Suicidal ideation [R45.851] 11/13/2017  . Anxiety and depression [F41.9, F32.9]   . Acute renal failure ([place]) [N17.9]   . Altered mental status [R41.82]   . Cocaine use disorder ([place]) [F14.10]   . AKI (acute kidney injury) ([place]) [N17.9] 11/07/2017  . Leukocytosis [D72.829] 11/07/2017  . Chronic kidney disease (CKD), stage III (moderate) ([place]) [N18.3] 09/19/2016  . Elevated LFTs [R94.5] 09/19/2016  . Major depressive disorder, recurrent episode, severe ([place]) [F33.2] 08/24/2015  . Insomnia [G47.00]   . MDD (major depressive disorder), recurrent severe, without psychosis ([place]) [F33.2] 08/14/2015  . Cannabis use disorder, mild, abuse [F12.10] 08/14/2015  . Cocaine abuse ([place]) [F14.10] 08/10/2015  . Weight loss, non-intentional [R63.4] 08/10/2015  . Depression [F32.9] 08/10/2015  . Essential hypertension [I10]   . Vaccine refused by patient [Z28.20] 05/24/2015  . Routine general medical examination at a health care facility [Z00.00] 05/24/2015  . Chronic back pain [M54.9, G89.29] 05/24/2015  . Family history of cancer [Z80.9] 05/24/2015  . Screening for prostate cancer [Z12.5] 05/24/2015  . Smoker [F17.200] 05/24/2015  . Screen for colon cancer [Z12.11] 05/24/2015  . Erectile dysfunction [N52.9] 01/31/2014   History of Present Illness:   Benjamin [name] is a 52 y/o M with history of MDD and polysubstance abuse who was admitted voluntarily from [facility] with worsening depression and SI without plan. Pt was recently discharged from [facility] on [facility]/27 with plan to follow up  at [facility] for substance use treatment, but he was told he would not be able to be continued on his prescription opiate pain medications at time of intake, so he left the program and began to have worsening depression and SI. He was medically cleared and then transferred to [facility].  Upon evaluation, pt shares, that he is feeling impulsive and has ongoing SI without specific plan. One of his triggers would be to return to home, and he does not feel that he can be safe outside the hospital at this time. He does have some future orientation about attending program called [facility] as early as Monday 9/2. He denies current SI/HI/AH/VH, but he is unable to contract for safety outside the hospital. He denies symptoms of mania, OCD, and PTSD. He denies illicit substance use since his last discharge.  Discussed with patient about his treatment options. He agrees to resume his previous medications with potential plan of discharge directly to [facility] on Monday AM if he has stability of his mood symptoms. Pt was in agreement with the above plan, and he had no further questions, comments, or concerns.  Associated Signs/Symptoms: Depression Symptoms:  depressed mood, insomnia, fatigue, feelings of worthlessness/guilt, (Hypo) Manic Symptoms:  NA Anxiety Symptoms:  Excessive Worry, Psychotic Symptoms:  NA PTSD Symptoms: NA Total Time spent with patient: 1 hour  Is the patient at risk to self? Yes.    Has the patient been a risk to self in the past 6 months? Yes.    Has the patient been a risk to self within the distant past? Yes.    Is the patient a risk to others? Yes.    Has the patient been a risk to others in the  past 6 months? Yes.    Has the patient been a risk to others within the distant past? No.   Prior Inpatient Therapy:   Prior Outpatient Therapy:    Alcohol Screening: Patient refused Alcohol Screening Tool: Yes 1. How often do you have a drink containing alcohol?: Never 2. How many drinks containing  alcohol do you have on a typical day when you are drinking?: 1 or 2 3. How often do you have six or more drinks on one occasion?: Never AUDIT-C Score: 0 4. How often during the last year have you found that you were not able to stop drinking once you had started?: Never 5. How often during the last year have you failed to do what was normally expected from you becasue of drinking?: Never 6. How often during the last year have you needed a first drink in the morning to get yourself going after a heavy drinking session?: Never 7. How often during the last year have you had a feeling of guilt of remorse after drinking?: Never 8. How often during the last year have you been unable to remember what happened the night before because you had been drinking?: Never 9. Have you or someone else been injured as a result of your drinking?: No 10. Has a relative or friend or a doctor or another health worker been concerned about your drinking or suggested you cut down?: No Alcohol Use Disorder Identification Test Final Score (AUDIT): 0 Intervention/Follow-up: AUDIT Score <7 follow-up not indicated Substance Abuse History in the last 12 months:  Yes.   Consequences of Substance Abuse: Medical Consequences:  worsened mood symptoms Previous Psychotropic Medications: Yes  Psychological Evaluations: Yes  Past Medical History:  Past Medical History:  Diagnosis Date  . Chronic back pain    managed by [facility]  . CKD (chronic kidney disease), stage III ([place]) 10/2017  . Erectile dysfunction   . History of substance abuse   . Hx of suicide attempt   . Hypertension     Past Surgical History:  Procedure Laterality Date  . ANKLE SURGERY     right  . APPLICATION OF WOUND VAC Right 08/17/2015   Procedure: APPLICATION OF WOUND VAC;  Surgeon: Roseanne Kaufman, MD;  Location: [place];  Service: Orthopedics;  Laterality: Right;  . HAND SURGERY     fracture, ORIG, teenage years, right.  . I&D  EXTREMITY Right 08/10/2015   Procedure: IRRIGATION AND DEBRIDEMENT SKIN, SUBCUTANEOS TISSUE AND  MUSCLE, CARPAL TUNNEL RELEASE, MEDIAN NERVE LYSIS WITH NERVE WRAP;  Surgeon: Roseanne Kaufman, MD;  Location: [place];  Service: Orthopedics;  Laterality: Right;  . INCISION AND DRAINAGE OF WOUND Right 08/15/2015   Procedure: IRRIGATION AND DEBRIDEMENT WOUND;  Surgeon: Justice Britain, MD;  Location: [facility];  Service: Orthopedics;  Laterality: Right;  . LUMBAR EPIDURAL INJECTION     [facility]  . SKIN SPLIT GRAFT Right 08/17/2015   Procedure: I&D RIGHT FOREARM/POSSIBLE SKIN GRAFT;  Surgeon: Roseanne Kaufman, MD;  Location: [facility];  Service: Orthopedics;  Laterality: Right;  . TENDON REPAIR N/A 07/24/2015   Procedure: WRIST LACERATION AND TENDON REPAIR;  Surgeon: Iran Planas, MD;  Location: [place];  Service: Orthopedics;  Laterality: N/A;  . WOUND EXPLORATION Right 08/15/2015   Procedure: WOUND EXPLORATION right forearm;  Surgeon: Justice Britain, MD;  Location: [facility];  Service: Orthopedics;  Laterality: Right;   Family History:  Family History  Problem Relation Age of Onset  . Cancer Mother  lung  . Anxiety disorder Mother   . Cancer Father        throat  . Diabetes Paternal Grandmother   . Cancer Paternal Aunt   . Heart disease Neg Hx   . Stroke Neg Hx    Family Psychiatric  History:  Tobacco Screening: Have you used any form of tobacco in the last 30 days? (Cigarettes, Smokeless Tobacco, Cigars, and/or Pipes): Yes Tobacco use, Select all that apply: 5 or more cigarettes per day Are you interested in Tobacco Cessation Medications?: Yes, will notify MD for an order Counseled patient on smoking cessation including recognizing danger situations, developing coping skills and basic information about quitting provided: Yes Social History:  Social History   Substance and Sexual Activity  Alcohol Use Yes  . Alcohol/week: 0.0 standard drinks   Comment: occasional     Social History    Substance and Sexual Activity  Drug Use Yes  . Types: Marijuana, Cocaine, Heroin   Comment: last use cocaine 08/08/15, last use MJ 08/08/15    Additional Social History:                           Allergies:  No Known Allergies Lab Results:  Results for orders placed or performed during the hospital encounter of 11/22/17 (from the past 48 hour(s))  Comprehensive metabolic panel     Status: Abnormal   Collection Time: 11/22/17 12:30 AM  Result Value Ref Range   Sodium 139 135 - 145 mmol/L   Potassium 4.0 3.5 - 5.1 mmol/L   Chloride 103 98 - 111 mmol/L   CO2 25 22 - 32 mmol/L   Glucose, Bld 98 70 - 99 mg/dL   BUN 13 6 - 20 mg/dL   Creatinine, Ser 1.60 (H) 0.61 - 1.24 mg/dL   Calcium 9.5 8.9 - 10.3 mg/dL   Total Protein 6.7 6.5 - 8.1 g/dL   Albumin 4.7 3.5 - 5.0 g/dL   AST 40 15 - 41 U/L   ALT 88 (H) 0 - 44 U/L   Alkaline Phosphatase 39 38 - 126 U/L   Total Bilirubin 1.5 (H) 0.3 - 1.2 mg/dL   GFR calc non Af Amer 49 (L) >60 mL/min   GFR calc Af Amer 56 (L) >60 mL/min    Comment: (NOTE) The eGFR has been calculated using the CKD EPI equation. This calculation has not been validated in all clinical situations. eGFR's persistently <60 mL/min signify possible Chronic Kidney Disease.    Anion gap 11 5 - 15    Comment: Performed at [facility] [address], [place], [place] [address]  Ethanol     Status: None   Collection Time: 11/22/17 12:30 AM  Result Value Ref Range   Alcohol, Ethyl (B) <10 <10 mg/dL    Comment: (NOTE) Lowest detectable limit for serum alcohol is 10 mg/dL. For medical purposes only. Performed at [facility], [place] [address], [place], [place] [address]   Salicylate level     Status: None   Collection Time: 11/22/17 12:30 AM  Result Value Ref Range   Salicylate Lvl <6.2 2.8 - 30.0 mg/dL    Comment: Performed at [facility] [address] [place], [place] [address]  Acetaminophen level     Status: Abnormal    Collection Time: 11/22/17 12:30 AM  Result Value Ref Range   Acetaminophen (Tylenol), Serum <10 (L) 10 - 30 ug/mL    Comment: (  NOTE) Therapeutic concentrations vary significantly. A range of 10-30 ug/mL  may be an effective concentration for many patients. However, some  are best treated at concentrations outside of this range. Acetaminophen concentrations >150 ug/mL at 4 hours after ingestion  and >50 ug/mL at 12 hours after ingestion are often associated with  toxic reactions. Performed at [facility], [place] [address] [place], [place] [address]   cbc     Status: Abnormal   Collection Time: 11/22/17 12:30 AM  Result Value Ref Range   WBC 5.0 4.0 - 10.5 K/uL   RBC 4.38 4.22 - 5.81 MIL/uL   Hemoglobin 11.1 (L) 13.0 - 17.0 g/dL   HCT 37.0 (L) 39.0 - 52.0 %   MCV 84.5 78.0 - 100.0 fL   MCH 25.3 (L) 26.0 - 34.0 pg   MCHC 30.0 30.0 - 36.0 g/dL   RDW 16.9 (H) 11.5 - 15.5 %   Platelets 265 150 - 400 K/uL    Comment: Performed at [facility], [place] [address], [place], [place] [address]  Rapid urine drug screen (hospital performed)     Status: Abnormal   Collection Time: 11/22/17  1:19 AM  Result Value Ref Range   Opiates POSITIVE (A) NONE DETECTED   Cocaine NONE DETECTED NONE DETECTED   Benzodiazepines NONE DETECTED NONE DETECTED   Amphetamines NONE DETECTED NONE DETECTED   Tetrahydrocannabinol NONE DETECTED NONE DETECTED   Barbiturates NONE DETECTED NONE DETECTED    Comment: (NOTE) DRUG SCREEN FOR MEDICAL PURPOSES ONLY.  IF CONFIRMATION IS NEEDED FOR ANY PURPOSE, NOTIFY LAB WITHIN 5 DAYS. LOWEST DETECTABLE LIMITS FOR URINE DRUG SCREEN Drug Class                     Cutoff (ng/mL) Amphetamine and metabolites    1000 Barbiturate and metabolites    200 Benzodiazepine                 048 Tricyclics and metabolites     300 Opiates and metabolites        300 Cocaine and metabolites        300 THC                            50 Performed at [facility],  [place] [address], [place], [place] [address]     Blood Alcohol level:  Lab Results  Component Value Date   ETH <10 11/22/2017   ETH <10 94/50/3888    Metabolic Disorder Labs:  Lab Results  Component Value Date   HGBA1C 5.6 08/31/2016   MPG 114 08/31/2016   MPG 120 (H) 05/24/2015   No results found for: PROLACTIN Lab Results  Component Value Date   CHOL 254 (H) 10/08/2017   TRIG 183 (H) 10/08/2017   HDL 58 10/08/2017   CHOLHDL 4.4 10/08/2017   VLDL 33 (H) 08/31/2016   LDLCALC 159 (H) 10/08/2017   LDLCALC 129 (H) 08/31/2016    Current Medications: Current Facility-Administered Medications  Medication Dose Route Frequency Provider Last Rate Last Dose  . acetaminophen (TYLENOL) tablet 650 mg  650 mg Oral Q4H PRN Rankin, Shuvon B, NP      . amLODipine (NORVASC) tablet 5 mg  5 mg Oral Daily Rankin, Shuvon B, NP   5 mg at 11/23/17 0819  . ferrous sulfate tablet 325 mg  325 mg Oral Q breakfast Rankin, Shuvon B, NP   325 mg  at 11/23/17 0819  . folic acid (FOLVITE) tablet 1 mg  1 mg Oral Daily Rankin, Shuvon B, NP   1 mg at 11/23/17 0819  . gabapentin (NEURONTIN) capsule 100 mg  100 mg Oral TID Rankin, Shuvon B, NP   100 mg at 11/23/17 0819  . HYDROcodone-acetaminophen (NORCO) 10-325 MG per tablet 1 tablet  1 tablet Oral Q4H PRN Rankin, Shuvon B, NP   1 tablet at 11/23/17 1016  . naphazoline-pheniramine (NAPHCON-A) 0.025-0.3 % ophthalmic solution 1 drop  1 drop Both Eyes QID PRN Rankin, Shuvon B, NP      . nicotine (NICODERM CQ - dosed in mg/24 hours) patch 21 mg  21 mg Transdermal Daily Rankin, Shuvon B, NP   21 mg at 11/23/17 0820  . ondansetron (ZOFRAN) tablet 4 mg  4 mg Oral Q8H PRN Rankin, Shuvon B, NP      . senna-docusate (Senokot-S) tablet 1 tablet  1 tablet Oral BID Rankin, Shuvon B, NP      . traZODone (DESYREL) tablet 100 mg  100 mg Oral QHS,MR X 1 Lindon Romp A, NP   100 mg at 11/22/17 2305  . vortioxetine HBr (TRINTELLIX) tablet 10 mg  10 mg Oral Daily Rankin, Shuvon B, NP    10 mg at 11/23/17 0820   PTA Medications: Medications Prior to Admission  Medication Sig Dispense Refill Last Dose  . amLODipine (NORVASC) 5 MG tablet Take 1 tablet (5 mg total) by mouth daily. For high blood pressure 30 tablet 0 11/22/2017 at Unknown time  . ferrous sulfate 325 (65 FE) MG tablet Take 1 tablet (325 mg total) by mouth daily with breakfast. For anemia 30 tablet 0 11/22/2017 at Unknown time  . folic acid (FOLVITE) 1 MG tablet Take 1 tablet (1 mg total) by mouth daily. For folate deficiency 30 tablet 0 11/22/2017 at Unknown time  . gabapentin (NEURONTIN) 100 MG capsule Take 1 capsule (100 mg total) by mouth 3 (three) times daily. Agitation 90 capsule 0 11/22/2017 at Unknown time  . HYDROcodone-acetaminophen (NORCO) 10-325 MG tablet Take 1 tablet by mouth every 4 (four) hours as needed for severe pain. 30 tablet 0 11/22/2017 at Unknown time  . naphazoline-pheniramine (NAPHCON-A) 0.025-0.3 % ophthalmic solution Place 1 drop into both eyes 4 (four) times daily as needed for eye irritation. 15 mL 0 unk at prn  . nicotine (NICODERM CQ - DOSED IN MG/24 HOURS) 21 mg/24hr patch Place 1 patch (21 mg total) onto the skin daily as needed (nicotine craving). 28 patch 0 11/22/2017 at Unknown time  . senna-docusate (SENOKOT-S) 8.6-50 MG tablet Take 1 tablet by mouth 2 (two) times daily. For constipation (Patient not taking: Reported on 11/22/2017) 60 tablet 0 Not Taking at Unknown time  . vortioxetine HBr (TRINTELLIX) 10 MG TABS tablet Take 1 tablet (10 mg total) by mouth daily. For depression 30 tablet 0 11/22/2017 at Unknown time    Musculoskeletal: Strength & Muscle Tone: within normal limits Gait & Station: normal Patient leans: N/A  Psychiatric Specialty Exam: Physical Exam  Nursing note and vitals reviewed.   Review of Systems  Constitutional: Negative for chills and fever.  Respiratory: Negative for cough and shortness of breath.   Cardiovascular: Negative for chest pain.   Gastrointestinal: Negative for abdominal pain, heartburn, nausea and vomiting.  Psychiatric/Behavioral: Positive for depression. Negative for hallucinations and suicidal ideas. The patient is nervous/anxious. The patient does not have insomnia.     Blood pressure 132/80, pulse 75, temperature 97.9 F (36.6  C), temperature source Oral, resp. rate 18, height 6' 2"  (1.88 m), weight 95.3 kg, SpO2 100 %.Body mass index is 26.96 kg/m.  General Appearance: Casual and Fairly Groomed  Eye Contact:  Good  Speech:  Clear and Coherent and Normal Rate  Volume:  Normal  Mood:  Anxious and Depressed  Affect:  Appropriate and Congruent  Thought Process:  Coherent and Goal Directed  Orientation:  Full (Time, Place, and Person)  Thought Content:  Logical  Suicidal Thoughts:  No  Homicidal Thoughts:  No  Memory:  Immediate;   Fair Recent;   Fair Remote;   Fair  Judgement:  Poor  Insight:  Lacking  Psychomotor Activity:  Normal  Concentration:  Concentration: Fair  Recall:  [employer] of Knowledge:  Fair  Language:  Fair  Akathisia:  No  Handed:    AIMS (if indicated):     Assets:  Resilience Social Support  ADL's:  Intact  Cognition:  WNL  Sleep:  Number of Hours: 5.5    Treatment Plan Summary: Daily contact with patient to assess and evaluate symptoms and progress in treatment and Medication management  Observation Level/Precautions:  15 minute checks  Laboratory:  CBC Chemistry Profile HbAIC UDS UA  Psychotherapy:  Encourage participation in groups and therapeutic milieu   Medications:  Resume medications as per DC on 8/27  Consultations:    Discharge Concerns:    Estimated LOS: 3-5 days  Other:     Physician Treatment Plan for Primary Diagnosis: MDD (major depressive disorder), recurrent severe, without psychosis ([place]) Long Term Goal(s): Improvement in symptoms so as ready for discharge  Short Term Goals: Ability to identify and develop effective coping behaviors will  improve  Physician Treatment Plan for Secondary Diagnosis: Principal Problem:   MDD (major depressive disorder), recurrent severe, without psychosis ([place])  Long Term Goal(s): Improvement in symptoms so as ready for discharge  Short Term Goals: Ability to identify triggers associated with substance abuse/mental health issues will improve  I certify that inpatient services furnished can reasonably be expected to improve the patient's condition.    Pennelope Bracken, MD 8/30/201912:48 PM

## 2017-11-24 DIAGNOSIS — F1410 Cocaine abuse, uncomplicated: Secondary | ICD-10-CM

## 2017-11-24 MED ORDER — HYDROXYZINE HCL 25 MG PO TABS
25.0000 mg | ORAL_TABLET | Freq: Four times a day (QID) | ORAL | Status: DC | PRN
  Administered 2017-11-24 – 2017-11-25 (×2): 25 mg via ORAL
  Filled 2017-11-24 (×2): qty 1

## 2017-11-24 MED ORDER — MIRTAZAPINE 7.5 MG PO TABS
7.5000 mg | ORAL_TABLET | Freq: Every day | ORAL | Status: DC
  Administered 2017-11-24: 7.5 mg via ORAL
  Filled 2017-11-24 (×3): qty 1

## 2017-11-24 NOTE — Progress Notes (Signed)
The patient verbalized that he had a good day but did not go into further detail. His goal for tomorrow is to have an even better day.

## 2017-11-24 NOTE — Plan of Care (Signed)
  Problem: Health Behavior/Discharge Planning: Goal: Identification of resources available to assist in meeting health care needs will improve Outcome: Progressing-Pt reported planning on going to [facility] on Monday for 30 treatment.

## 2017-11-24 NOTE — Progress Notes (Signed)
D:  Benjamin [name] has been up and visible on the unit.  He interacts well with staff and peers.  He did not attend evening AA group.  He denied SI/HI or A/V hallucinations.  He did report that his sleeping medications was too much last night and requested lower dosage.  [facility]  He did request pain medication for chronic back pain.  He is currently resting with his eyes closed and appears to be asleep. A:  1:1 with RN for support and encouragement.  Medications as ordered.  Q 15 minute checks maintained for safety.  Encouraged participation in group and unit activities.   R:  Benjamin [name] remains safe on the unit.  We will continue to monitor the progress towards his goals.

## 2017-11-24 NOTE — Progress Notes (Signed)
D: Patient is visible in the milieu.  He is interacting well with staff and peers.  He rates his depression as a 5; hopelessness and anxiety as a 3.  His goal today is to "prepare for discharge."  Patient will discharge Monday 0900; his girlfriend will pick him up and transport [facility].  He denies any thoughts of self harm.  Patient continues to complain of severe back pain and takes scheduled Norco every 4 hours for same.    A: Continue to monitor medication management and MD orders.  Safety checks completed every 15 minutes per protocol.  Offer support and encouragement as needed.  R: Patient is receptive to staff; his behavior is appropriate.

## 2017-11-24 NOTE — [facility] Group Notes (Signed)
[facility] Group Notes: (Clinical Social Work)   11/24/2017      Type of Therapy:  Group Therapy   Participation Level:  Did Not Attend despite MHT prompting   Romney Compean Grossman-Orr, LCSW 11/24/2017, 12:53 PM     

## 2017-11-24 NOTE — [facility] Group Notes (Signed)
[facility] Group Notes:  Nursing Psychoeducational Group  Date:  11/24/2017  Time:  1:00 PM  Group: Life Skills  Facilitator: Patty D. RN  Type of Therapy:  Psychoeducational Skills  Participation Level:  Active  Participation Quality:  Appropriate  Affect:  Appropriate  Cognitive:  Alert and Oriented  Insight:  Improving  Engagement in Group:  Developing/Improving  Modes of Intervention:  Discussion and Education  Donneisha Beane A Jordyn Hofacker 11/24/2017, 2:00 PM 

## 2017-11-24 NOTE — Plan of Care (Signed)
  Problem: Activity: Goal: Imbalance in normal sleep/wake cycle will improve Outcome: Progressing-pt reported that his trazodone was too much for him last night and requested lower dose.  [facility] and new orders noted.     Problem: Activity: Goal: Interest or engagement in leisure activities will improve Outcome: Not Progressing-He has been in the day room interacting some with peers but did not attend evening AA group.

## 2017-11-24 NOTE — Progress Notes (Signed)
D:  [name] reported having a pretty good day.  He stated he was glad that he slept better last night and is looking forward to changes in his sleeping medications tonight.  He denied SI/HI or A/V hallucinations.  He reported having a good visit with his wife and is looking forward to going to [facility] on Monday.  He voiced some concerns about payment but he stated that he worked out the details with the [facility] staff.  He took his pain medication for pain 8/10 and stated he will probably be up by 1230am to get another dose and obtained vistaril for anxiety with good relief.  He attended evening wrap up group. A:  1:1 with RN for support and encouragement.  Medications as ordered.  Q 15 minute checks maintained for safety.  Encouraged participation in group and unit activities.   R:  Benjamin [name] remains safe on the unit.  We will continue to monitor the progress towards his goals.

## 2017-11-24 NOTE — Progress Notes (Signed)
Patient ID: Benjamin [name], male   DOB: 11-16-1965, 52 y.o.   MRN: [id number]   [facility]  11/24/2017 12:00 PM Benjamin [name]  MRN:  [id number]  Subjective: Benjamin [name] reports, "I'm leaving Monday, kinda excited about moving to the next step.  Still a little depressed but I think [facility] is going to help me tremendously.  It's going to be a struggle financially but they are setting me up with payments".   This is a 52 year old AA male, lives with GF, employed, admitted for depression, suicidal ideations. He has a past history of opiate (heroin), and cocaine abuse although states he had not been using regularly recently. He has history of prior psychiatric admission for depression and self cutting.  Benjamin [name] is seen, chart reviewed. The chart findings discussed with the treatment team. Today, he presents alert &  oriented x 4. He is visible on the unit attending most group sessions. He says he is doing much better. Says, he does not want Trazodone but something else for sleep.  He wanted Ativan but explained we would not give that and the negative side effects with his pain medicine. Benjamin [name] is very med focused regarding his pain medication.  No signs of pain but reports chronic back pain. He says he is doing well on the rest of the medicines. He denies any adverse effects. Denies any SIHI or AVH & no psychosis observed. He is complaint with his medications and today denies any intolerances or medication related side effects. Benjamin [name] is to be discharged Monday to [facility]. He denies any new issues or concerns.  Principal Problem: MDD (major depressive disorder), recurrent severe, without psychosis (HCC) Diagnosis:   Patient Active Problem List   Diagnosis Date Noted  . MDD (major depressive disorder), recurrent severe, without psychosis (HCC) [F33.2] 08/14/2015    Priority: High  . Cocaine abuse (HCC) [F14.10] 08/10/2015    Priority: High  . Polysubstance dependence including opioid drug with daily use  (HCC) [F19.20] 11/14/2017  . Suicidal ideation [R45.851] 11/13/2017  . Anxiety and depression [F41.9, F32.9]   . Acute renal failure (HCC) [N17.9]   . Altered mental status [R41.82]   . Cocaine use disorder (HCC) [F14.10]   . AKI (acute kidney injury) (HCC) [N17.9] 11/07/2017  . Leukocytosis [D72.829] 11/07/2017  . Chronic kidney disease (CKD), stage III (moderate) (HCC) [N18.3] 09/19/2016  . Elevated LFTs [R94.5] 09/19/2016  . Major depressive disorder, recurrent episode, severe (HCC) [F33.2] 08/24/2015  . Insomnia [G47.00]   . Cannabis use disorder, mild, abuse [F12.10] 08/14/2015  . Weight loss, non-intentional [R63.4] 08/10/2015  . Depression [F32.9] 08/10/2015  . Essential hypertension [I10]   . Vaccine refused by patient [Z28.20] 05/24/2015  . Routine general medical examination at a health care facility [Z00.00] 05/24/2015  . Chronic back pain [M54.9, G89.29] 05/24/2015  . Family history of cancer [Z80.9] 05/24/2015  . Screening for prostate cancer [Z12.5] 05/24/2015  . Smoker [F17.200] 05/24/2015  . Screen for colon cancer [Z12.11] 05/24/2015  . Erectile dysfunction [N52.9] 01/31/2014   Total Time spent with patient: 15 minutes  Past Psychiatric History: See H&P  Past Medical History:  Past Medical History:  Diagnosis Date  . Chronic back pain    managed [facility]  . CKD (chronic kidney disease), stage III (HCC) 10/2017  . Erectile dysfunction   . History of substance abuse   . Hx of suicide attempt   . Hypertension     Past Surgical History:  Procedure Laterality Date  . ANKLE SURGERY     right  . APPLICATION OF WOUND VAC Right 08/17/2015   Procedure: APPLICATION OF WOUND VAC;  Surgeon: Dominica Severin, MD;  Location: [facility] OR;  Service: Orthopedics;  Laterality: Right;  . HAND SURGERY     fracture, ORIG, teenage years, right.  . I&D EXTREMITY Right 08/10/2015   Procedure: IRRIGATION AND DEBRIDEMENT SKIN, SUBCUTANEOS TISSUE AND  MUSCLE, CARPAL  TUNNEL RELEASE, MEDIAN NERVE LYSIS WITH NERVE WRAP;  Surgeon: Dominica Severin, MD;  Location: [facility] OR;  Service: Orthopedics;  Laterality: Right;  . INCISION AND DRAINAGE OF WOUND Right 08/15/2015   Procedure: IRRIGATION AND DEBRIDEMENT WOUND;  Surgeon: Francena Hanly, MD;  Location: [facility] ORS;  Service: Orthopedics;  Laterality: Right;  . LUMBAR EPIDURAL INJECTION     [facility]  . SKIN SPLIT GRAFT Right 08/17/2015   Procedure: I&D RIGHT FOREARM/POSSIBLE SKIN GRAFT;  Surgeon: Dominica Severin, MD;  Location: [facility] OR;  Service: Orthopedics;  Laterality: Right;  . TENDON REPAIR N/A 07/24/2015   Procedure: WRIST LACERATION AND TENDON REPAIR;  Surgeon: Bradly Bienenstock, MD;  Location: [facility] OR;  Service: Orthopedics;  Laterality: N/A;  . WOUND EXPLORATION Right 08/15/2015   Procedure: WOUND EXPLORATION right forearm;  Surgeon: Francena Hanly, MD;  Location: [facility]S;  Service: Orthopedics;  Laterality: Right;   Family History:  Family History  Problem Relation Age of Onset  . Cancer Mother        lung  . Anxiety disorder Mother   . Cancer Father        throat  . Diabetes Paternal Grandmother   . Cancer Paternal Aunt   . Heart disease Neg Hx   . Stroke Neg Hx    Family Psychiatric  History: See H&P  Social History:  Social History   Substance and Sexual Activity  Alcohol Use Yes  . Alcohol/week: 0.0 standard drinks   Comment: occasional     Social History   Substance and Sexual Activity  Drug Use Yes  . Types: Marijuana, Cocaine, Heroin   Comment: last use cocaine 08/08/15, last use MJ 08/08/15    Social History   Socioeconomic History  . Marital status: Single    Spouse name: Not on file  . Number of children: Not on file  . Years of education: Not on file  . Highest education level: Not on file  Occupational History  . Not on file  Social Needs  . Financial resource strain: Not on file  . Food insecurity:    Worry: Not on file    Inability: Not on file  . Transportation  needs:    Medical: Not on file    Non-medical: Not on file  Tobacco Use  . Smoking status: Current Every Day Smoker    Packs/day: 0.50    Years: 6.00    Pack years: 3.00    Types: Cigarettes  . Smokeless tobacco: Never Used  Substance and Sexual Activity  . Alcohol use: Yes    Alcohol/week: 0.0 standard drinks    Comment: occasional  . Drug use: Yes    Types: Marijuana, Cocaine, Heroin    Comment: last use cocaine 08/08/15, last use MJ 08/08/15  . Sexual activity: Yes  Lifestyle  . Physical activity:    Days per week: Not on file    Minutes per session: Not on file  . Stress: Not on file  Relationships  . Social connections:    Talks on phone: Not on file  Gets together: Not on file    Attends religious service: Not on file    Active member of club or organization: Not on file    Attends meetings of clubs or organizations: Not on file    Relationship status: Not on file  Other Topics Concern  . 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   No children.  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   09/2017.    Additional Social History:   Sleep: Fair  Appetite:  Good  Current Medications: Current Facility-Administered Medications  Medication Dose Route Frequency Provider Last Rate Last Dose  . acetaminophen (TYLENOL) tablet 650 mg  650 mg Oral Q4H PRN Rankin, Shuvon B, NP      . amLODipine (NORVASC) tablet 5 mg  5 mg Oral Daily Rankin, Shuvon B, NP   5 mg at 11/24/17 0757  . ferrous sulfate tablet 325 mg  325 mg Oral Q breakfast Rankin, Shuvon B, NP   325 mg at 11/24/17 0757  . folic acid (FOLVITE) tablet 1 mg  1 mg Oral Daily Rankin, Shuvon B, NP   1 mg at 11/24/17 0757  . gabapentin (NEURONTIN) capsule 100 mg  100 mg Oral TID Rankin, Shuvon B, NP   100 mg at 11/24/17 1125  . HYDROcodone-acetaminophen (NORCO) 10-325 MG per tablet 1 tablet  1 tablet Oral Q4H PRN Rankin, Shuvon B, NP   1 tablet  at 11/24/17 1105  . naphazoline-pheniramine (NAPHCON-A) 0.025-0.3 % ophthalmic solution 1 drop  1 drop Both Eyes QID PRN Rankin, Shuvon B, NP      . nicotine (NICODERM CQ - dosed in mg/24 hours) patch 21 mg  21 mg Transdermal Daily Rankin, Shuvon B, NP   21 mg at 11/24/17 0756  . ondansetron (ZOFRAN) tablet 4 mg  4 mg Oral Q8H PRN Rankin, Shuvon B, NP      . senna-docusate (Senokot-S) tablet 1 tablet  1 tablet Oral BID Rankin, Shuvon B, NP      . traZODone (DESYREL) tablet 50 mg  50 mg Oral QHS,MR X 1 Simon, Spencer E, PA-C   50 mg at 11/23/17 2248  . vortioxetine HBr (TRINTELLIX) tablet 10 mg  10 mg Oral Daily Rankin, Shuvon B, NP   10 mg at 11/24/17 0757   Lab Results:  No results found for this or any previous visit (from the past 48 hour(s)).  Blood Alcohol level:  Lab Results  Component Value Date   ETH <10 11/22/2017   ETH <10 11/07/2017   Metabolic Disorder Labs: Lab Results  Component Value Date   HGBA1C 5.6 08/31/2016   MPG 114 08/31/2016   MPG 120 (H) 05/24/2015   No results found for: PROLACTIN Lab Results  Component Value Date   CHOL 254 (H) 10/08/2017   TRIG 183 (H) 10/08/2017   HDL 58 10/08/2017   CHOLHDL 4.4 10/08/2017   VLDL 33 (H) 08/31/2016   LDLCALC 159 (H) 10/08/2017   LDLCALC 129 (H) 08/31/2016   Physical Findings: AIMS: Facial and Oral Movements Muscles of Facial Expression: None, normal Lips and Perioral Area: None, normal Jaw: None, normal Tongue: None, normal,Extremity Movements Upper (arms, wrists, hands, fingers): None, normal Lower (legs, knees, ankles, toes): None, normal, Trunk Movements Neck, shoulders, hips: None, normal, Overall Severity Severity of abnormal movements (highest score from questions above): None, normal Incapacitation due to abnormal movements: None, normal Patient's awareness of abnormal movements (rate only patient's  report): No Awareness, Dental Status Current problems with teeth and/or dentures?: No Does patient  usually wear dentures?: No  CIWA:    COWS:     Musculoskeletal: Strength & Muscle Tone: within normal limits Gait & Station: normal Patient leans: N/A  Psychiatric Specialty Exam: Physical Exam  Nursing note and vitals reviewed. Constitutional: He is oriented to person, place, and time. He appears well-developed and well-nourished.  HENT:  Head: Normocephalic.  Neck: Normal range of motion.  Respiratory: Effort normal.  Musculoskeletal: Normal range of motion.  Neurological: He is alert and oriented to person, place, and time.  Psychiatric: His speech is normal and behavior is normal. Judgment and thought content normal. His affect is blunt. Cognition and memory are normal. He exhibits a depressed mood.    Review of Systems  Constitutional: Negative for chills and fever.  Respiratory: Negative for cough and shortness of breath.   Cardiovascular: Negative for chest pain.  Gastrointestinal: Negative for abdominal pain, heartburn, nausea and vomiting.  Psychiatric/Behavioral: Positive for depression. Negative for hallucinations and suicidal ideas. The patient does not have insomnia.   All other systems reviewed and are negative. Today denies dizziness or lightheadedness, no chest pain, no shortness of breath, no vomiting  Blood pressure (!) 127/96, pulse 70, temperature 97.7 F (36.5 C), temperature source Oral, resp. rate 16, height 6\' 2"  (1.88 m), weight 95.3 kg, SpO2 100 %.Body mass index is 26.96 kg/m.  General Appearance: Well Groomed  Eye Contact:  Good  Speech:  Normal Rate  Volume:  Normal  Mood:  Depressed   Affect:  Appropriate  Thought Process:  Linear and Descriptions of Associations: Intact  Orientation:  Other:  he is alert, attentive and fully oriented x 3   Thought Content:  no hallucinations, no delusions   Suicidal Thoughts:  No denies any suicidal plan or intention, contracts for safety   Homicidal Thoughts:  No denies any homicidal or violent ideations   Memory:  recent and remote grossly intact   Judgement: Improving  Insight:  Fair/improving  Psychomotor Activity:  Normal  Concentration:  Concentration: Good and Attention Span: Good  Recall:  Good  Fund of Knowledge:  Good  Language:  Good  Akathisia:  No  Handed:    AIMS (if indicated):     Assets:  Physical Health Resilience  ADL's:  Intact  Cognition:  WNL  Sleep:  Number of Hours: 5   Assessment - 52 year old male, history of depression, prior suicide attempt by self cutting , anxiety,  substance abuse, who was admitted due to worsening depression and suicidal ideations.  At this time patient continues to present with slow improvement in mood..  Currently denies suicidal ideations.  Denies medication related side effects or somatic complaints.   Daily contact with patient to assess and evaluate symptoms and progress in treatment and Medication management .  - Continue inpatient hospitalization.  - Will continue today 11/24/2017 plan as below except where it is noted.  Encourage group and milieu participation to work on coping skills and symptom reduction.  Continue Neurontin  100 mgrs TID for agitation/pain.  Start Vistaril  25 mgrs Q 6 hours PRN  Start Remeron 7.5 mg at bedtime for sleep  Continue Norvasc 5mg  QDAY for HTN.  Continue Trintellix 10 mg daily for depression.  Continue Ferrous Sulfate 325mg  QDAY for anemia.  Continue Naphcon-A 1 drop both eyes QID prn eye irritation.  Continue Norco 10-325mg  1 tablet q4h prn severe pain (resuming home regimen).  Treatment team working on disposition planning options  Patient is scheduled to be discharged to [facility] on Monday morning.  Nanine Means, NP, PMHNP 11/24/2017, 12:00 PM  Patient ID: Benjamin [name], male   DOB: Aug 22, 1965, 52 y.o.   MRN: [id number]  RN: [id number]

## 2017-11-25 NOTE — Plan of Care (Signed)
  Problem: Education: Goal: Emotional status will improve Outcome: Progressing   Problem: Self-Concept: Goal: Will verbalize positive feelings about self Outcome: Progressing

## 2017-11-25 NOTE — Progress Notes (Signed)
D:  Benjamin [name] has been up and visible on the unit.  He reported having a good visit with his wife tonight and is looking forward to being discharged tomorrow.  He denied SI/HI or A/V hallucinations.  He declined taking his Remeron tonight because "I don't like the way sleeping pills make me feel" but did take a vistaril for anxiety.  He continues to request his pain medication every four hours with pain level of 9/10 and stated "I will see you at midnight for pain medications."  He declines any other pain relief techniques options such as ice/heat.  He did attend evening group.   A:  1:1 with RN for support and encouragement.  Medications as ordered.  Q 15 minute checks maintained for safety.  Encouraged participation in group and unit activities.   R:  Benjamin [name] remains safe on the unit.  We will continue to monitor the progress towards his goals.

## 2017-11-25 NOTE — Progress Notes (Signed)
Pt presents with a flat affect and anxious mood. Pt rated on his self inventory sheet: depression 5/10, anxiety 7/10 and hopelessness 7/10. Pt denies SI/HI. Pt reported fair sleep last night. Pt stated goal for today, "staying positive". Pt expressed that his discharge plan is to leave from here at 9 am and go to [facility] treatment center tomorrow. Pt compliant with taking his meds and denies any side effects.   Medications reviewed with pt. Verbal support provided. Pt encouraged to attend groups. 15 minute checks performed for safety.  Pt compliant with tx plan.

## 2017-11-25 NOTE — [facility] Group Notes (Signed)
[facility] LCSW Group Therapy Note  11/25/2017  10:00-11:00AM  Type of Therapy and Topic:  Group Therapy:  Being Your Own Support  Participation Level:  Did Not Attend   Description of Group:  Patients in this group were introduced to the concept that self-support is an essential part of recovery.  A song entitled "My Own Hero" was played and a group discussion ensued in which patients stated they could relate to the song and it inspired them to realize they have be willing to help themselves in order to succeed, because other people cannot achieve sobriety or stability for them.  We discussed adding a variety of healthy supports to address the various needs in their lives.  A song was played called "I Know Where I've Been" toward the end of group and used to conduct an inspirational wrap-up to group of remembering how far they have already come in their journey.  Therapeutic Goals: 1)  demonstrate the importance of being a part of one's own support system 2)  discuss reasons people in one's life may eventually be unable to be continually supportive  3)  identify the patient's current support system and   4)  elicit commitments to add healthy supports and to become more conscious of being self-supportive   Summary of Patient Progress:  N/A   Therapeutic Modalities:   Motivational Interviewing Activity  Marylen Zuk J Grossman-Orr       

## 2017-11-25 NOTE — Progress Notes (Signed)
  [facility] Adult Case Management Discharge Plan :  Will you be returning to the same living situation after discharge:  No.  Going to rehab At discharge, do you have transportation home?: Yes,  girlfriend Do you have the ability to pay for your medications: Yes,  has insurance (private) and limited income  Release of information consent forms completed and turned in to Medical Records by CSW.   Patient to Follow up at: Follow-up Information    [facility]. Go on 11/26/2017.   Why:  You have been accepted to this program and need to go straight from the hospital to the facility upon discharge. Contact information: [address] [place]  [address] [phone]          Next level of care provider has access to [facility] Link:no  Safety Planning and Suicide Prevention discussed: Yes,  with girlfriend  Have you used any form of tobacco in the last 30 days? (Cigarettes, Smokeless Tobacco, Cigars, and/or Pipes): Yes  Has patient been referred to the Quitline?: N/A patient is going to a facility and cannot be referred currently  Patient has been referred for addiction treatment: Yes  Lynnell Chad, LCSW 11/25/2017, 2:08 PM

## 2017-11-25 NOTE — Progress Notes (Signed)
Adult Psychoeducational Group Note  Date:  11/25/2017 Time: 1300  Group Topic/Focus:  Coping with Anger  Participation Level:  Active  Participation Quality:  Appropriate  Affect:  Appropriate  Cognitive:  Appropriate  Insight: Appropriate  Engagement in Group:  Engaged  Modes of Intervention:  Discussion and Education  Additional Comments:     

## 2017-11-25 NOTE — [facility] Group Notes (Signed)
Pt was invited but did not attend orientation/goals group. 

## 2017-11-25 NOTE — Progress Notes (Signed)
The patient attended the evening A.A.meeting and was appropriate.  

## 2017-11-25 NOTE — Plan of Care (Signed)
  Problem: Medication: Goal: Compliance with prescribed medication regimen will improve Outcome: Progressing-he has been taking prescribed medication but has declined sleeping pill due to unpleasant side effects

## 2017-11-25 NOTE — [facility] Suicide Risk Assessment (Deleted)
[facility] INPATIENT:  Family/Significant Other Suicide Prevention Education  Suicide Prevention Education:  Education Completed; mother, Jarome Matin [phone] been identified by the patient as the family member/significant other with whom the patient will be residing, and identified as the person(s) who will aid the patient in the event of a mental health crisis (suicidal ideations/suicide attempt).  With written consent from the patient, the family member/significant other has been provided the following suicide prevention education, prior to the and/or following the discharge of the patient.  For the most part, mother already knew the information below and asked for it to be abbreviated and to send a brochure home.  This was done.  She also confirmed there are no firearms in the home where they are staying with a friend.  The suicide prevention education provided includes the following:  Suicide risk factors  Suicide prevention and interventions  National Suicide Hotline telephone number  [facility] assessment telephone number  [facility] Emergency Assistance 911  [facility] and/or Residential Mobile Crisis Unit telephone number  Request made of family/significant other to:  Remove weapons (e.g., guns, rifles, knives), all items previously/currently identified as safety concern.    Remove drugs/medications (over-the-counter, prescriptions, illicit drugs), all items previously/currently identified as a safety concern.  The family member/significant other verbalizes understanding of the suicide prevention education information provided.  The family member/significant other agrees to remove the items of safety concern listed above.  Carloyn Jaeger Grossman-Orr 11/25/2017, 2:14 PM

## 2017-11-25 NOTE — Progress Notes (Signed)
Patient ID: Benjamin [name], male   DOB: February 12, 1966, 52 y.o.   MRN: [id number]  [facility] MD Progress Note  11/25/2017 9:11 AM Mithcell Schumpert  MRN:  [id number]  Subjective: [name] reports, "No, my medications are not changing.  They are to stay the same."  "Depression is ok, Sleep was alright with that new medication you gave me but a little tired, appetite is fine."  Denies suicidal ideations.   This is a 52 year old AA male, lives with GF, employed, admitted for depression, suicidal ideations. He has a past history of opiate (heroin), and cocaine abuse although states he had not been using regularly recently. He has history of prior psychiatric admission for depression and self cutting.  Kesler is seen, chart reviewed. The chart findings discussed with the treatment team. Today, he presents alert &  oriented x 4. He is visible on the unit attending most group sessions. He jumped out of bed when this provider reported the MD wanted to start weaning him off his pain medication due to going to a rehab.  [name] went directly to the MD who decided to keep this medication as he would discharge tomorrow to rehab.  Discussed that rehab would not continue this medication but he reports they are starting him on "something better, Suboxone," but does not know what this is.  Explained this to him, appears to have a vague understanding.  No signs of pain but reports chronic back pain. He says he is doing well on the rest of the medicines. He denies any adverse effects. Denies any SIHI or AVH & no psychosis observed. He is complaint with his medications and today denies any intolerances or medication related side effects. [name] is to be discharged Monday to go to [facility]. He denies any new issues or concerns.  Principal Problem: MDD (major depressive disorder), recurrent severe, without psychosis (HCC) Diagnosis:   Patient Active Problem List   Diagnosis Date Noted  . MDD (major depressive disorder), recurrent severe, without  psychosis (HCC) [F33.2] 08/14/2015    Priority: High  . Cocaine abuse (HCC) [F14.10] 08/10/2015    Priority: High  . Polysubstance dependence including opioid drug with daily use (HCC) [F19.20] 11/14/2017  . Suicidal ideation [R45.851] 11/13/2017  . Anxiety and depression [F41.9, F32.9]   . Acute renal failure (HCC) [N17.9]   . Altered mental status [R41.82]   . Cocaine use disorder (HCC) [F14.10]   . AKI (acute kidney injury) (HCC) [N17.9] 11/07/2017  . Leukocytosis [D72.829] 11/07/2017  . Chronic kidney disease (CKD), stage III (moderate) (HCC) [N18.3] 09/19/2016  . Elevated LFTs [R94.5] 09/19/2016  . Major depressive disorder, recurrent episode, severe (HCC) [F33.2] 08/24/2015  . Insomnia [G47.00]   . Cannabis use disorder, mild, abuse [F12.10] 08/14/2015  . Weight loss, non-intentional [R63.4] 08/10/2015  . Depression [F32.9] 08/10/2015  . Essential hypertension [I10]   . Vaccine refused by patient [Z28.20] 05/24/2015  . Routine general medical examination at a health care facility [Z00.00] 05/24/2015  . Chronic back pain [M54.9, G89.29] 05/24/2015  . Family history of cancer [Z80.9] 05/24/2015  . Screening for prostate cancer [Z12.5] 05/24/2015  . Smoker [F17.200] 05/24/2015  . Screen for colon cancer [Z12.11] 05/24/2015  . Erectile dysfunction [N52.9] 01/31/2014   Total Time spent with patient: 15 minutes  Past Psychiatric History: See H&P  Past Medical History:  Past Medical History:  Diagnosis Date  . Chronic back pain    managed [facility]  . CKD (chronic kidney disease), stage  III (HCC) 10/2017  . Erectile dysfunction   . History of substance abuse   . Hx of suicide attempt   . Hypertension     Past Surgical History:  Procedure Laterality Date  . ANKLE SURGERY     right  . APPLICATION OF WOUND VAC Right 08/17/2015   Procedure: APPLICATION OF WOUND VAC;  Surgeon: Dominica Severin, MD;  Location: [facility] OR;  Service: Orthopedics;  Laterality:  Right;  . HAND SURGERY     fracture, ORIG, teenage years, right.  . I&D EXTREMITY Right 08/10/2015   Procedure: IRRIGATION AND DEBRIDEMENT SKIN, SUBCUTANEOS TISSUE AND  MUSCLE, CARPAL TUNNEL RELEASE, MEDIAN NERVE LYSIS WITH NERVE WRAP;  Surgeon: Dominica Severin, MD;  Location: [facility] OR;  Service: Orthopedics;  Laterality: Right;  . INCISION AND DRAINAGE OF WOUND Right 08/15/2015   Procedure: IRRIGATION AND DEBRIDEMENT WOUND;  Surgeon: Francena Hanly, MD;  Location: [facility]S;  Service: Orthopedics;  Laterality: Right;  . LUMBAR EPIDURAL INJECTION     [facility]  . SKIN SPLIT GRAFT Right 08/17/2015   Procedure: I&D RIGHT FOREARM/POSSIBLE SKIN GRAFT;  Surgeon: Dominica Severin, MD;  Location: [facility] OR;  Service: Orthopedics;  Laterality: Right;  . TENDON REPAIR N/A 07/24/2015   Procedure: WRIST LACERATION AND TENDON REPAIR;  Surgeon: Bradly Bienenstock, MD;  Location: [facility] OR;  Service: Orthopedics;  Laterality: N/A;  . WOUND EXPLORATION Right 08/15/2015   Procedure: WOUND EXPLORATION right forearm;  Surgeon: Francena Hanly, MD;  Location: [facility];  Service: Orthopedics;  Laterality: Right;   Family History:  Family History  Problem Relation Age of Onset  . Cancer Mother        lung  . Anxiety disorder Mother   . Cancer Father        throat  . Diabetes Paternal Grandmother   . Cancer Paternal Aunt   . Heart disease Neg Hx   . Stroke Neg Hx    Family Psychiatric  History: See H&P  Social History:  Social History   Substance and Sexual Activity  Alcohol Use Yes  . Alcohol/week: 0.0 standard drinks   Comment: occasional     Social History   Substance and Sexual Activity  Drug Use Yes  . Types: Marijuana, Cocaine, Heroin   Comment: last use cocaine 08/08/15, last use MJ 08/08/15    Social History   Socioeconomic History  . Marital status: Single    Spouse name: Not on file  . Number of children: Not on file  . Years of education: Not on file  . Highest education level: Not on file   Occupational History  . Not on file  Social Needs  . Financial resource strain: Not on file  . Food insecurity:    Worry: Not on file    Inability: Not on file  . Transportation needs:    Medical: Not on file    Non-medical: Not on file  Tobacco Use  . Smoking status: Current Every Day Smoker    Packs/day: 0.50    Years: 6.00    Pack years: 3.00    Types: Cigarettes  . Smokeless tobacco: Never Used  Substance and Sexual Activity  . Alcohol use: Yes    Alcohol/week: 0.0 standard drinks    Comment: occasional  . Drug use: Yes    Types: Marijuana, Cocaine, Heroin    Comment: last use cocaine 08/08/15, last use MJ 08/08/15  . Sexual activity: Yes  Lifestyle  . Physical activity:    Days per week:  Not on file    Minutes per session: Not on file  . Stress: Not on file  Relationships  . Social connections:    Talks on phone: Not on file    Gets together: Not on file    Attends religious service: Not on file    Active member of club or organization: Not on file    Attends meetings of clubs or organizations: Not on file    Relationship status: Not on file  Other Topics Concern  . 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   No children.  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   09/2017.    Additional Social History:   Sleep: Fair  Appetite:  Good  Current Medications: Current Facility-Administered Medications  Medication Dose Route Frequency Provider Last Rate Last Dose  . acetaminophen (TYLENOL) tablet 650 mg  650 mg Oral Q4H PRN Rankin, Shuvon B, NP      . amLODipine (NORVASC) tablet 5 mg  5 mg Oral Daily Rankin, Shuvon B, NP   5 mg at 11/25/17 0751  . ferrous sulfate tablet 325 mg  325 mg Oral Q breakfast Rankin, Shuvon B, NP   325 mg at 11/25/17 0751  . folic acid (FOLVITE) tablet 1 mg  1 mg Oral Daily Rankin, Shuvon B, NP   1 mg at 11/25/17 0751  . gabapentin (NEURONTIN) capsule 100 mg   100 mg Oral TID Rankin, Shuvon B, NP   100 mg at 11/25/17 0751  . HYDROcodone-acetaminophen (NORCO) 10-325 MG per tablet 1 tablet  1 tablet Oral Q4H PRN Rankin, Shuvon B, NP   1 tablet at 11/25/17 0627  . hydrOXYzine (ATARAX/VISTARIL) tablet 25 mg  25 mg Oral Q6H PRN Charm Rings, NP   25 mg at 11/24/17 2020  . mirtazapine (REMERON) tablet 7.5 mg  7.5 mg Oral QHS Charm Rings, NP   7.5 mg at 11/24/17 2127  . naphazoline-pheniramine (NAPHCON-A) 0.025-0.3 % ophthalmic solution 1 drop  1 drop Both Eyes QID PRN Rankin, Shuvon B, NP      . nicotine (NICODERM CQ - dosed in mg/24 hours) patch 21 mg  21 mg Transdermal Daily Rankin, Shuvon B, NP   21 mg at 11/25/17 0754  . ondansetron (ZOFRAN) tablet 4 mg  4 mg Oral Q8H PRN Rankin, Shuvon B, NP      . senna-docusate (Senokot-S) tablet 1 tablet  1 tablet Oral BID Rankin, Shuvon B, NP      . vortioxetine HBr (TRINTELLIX) tablet 10 mg  10 mg Oral Daily Rankin, Shuvon B, NP   10 mg at 11/25/17 0751   Lab Results:  No results found for this or any previous visit (from the past 48 hour(s)).  Blood Alcohol level:  Lab Results  Component Value Date   ETH <10 11/22/2017   ETH <10 11/07/2017   Metabolic Disorder Labs: Lab Results  Component Value Date   HGBA1C 5.6 08/31/2016   MPG 114 08/31/2016   MPG 120 (H) 05/24/2015   No results found for: PROLACTIN Lab Results  Component Value Date   CHOL 254 (H) 10/08/2017   TRIG 183 (H) 10/08/2017   HDL 58 10/08/2017   CHOLHDL 4.4 10/08/2017   VLDL 33 (H) 08/31/2016   LDLCALC 159 (H) 10/08/2017   LDLCALC 129 (H) 08/31/2016   Physical Findings: AIMS: Facial and Oral Movements Muscles of Facial Expression: None, normal Lips and Perioral Area:  None, normal Jaw: None, normal Tongue: None, normal,Extremity Movements Upper (arms, wrists, hands, fingers): None, normal Lower (legs, knees, ankles, toes): None, normal, Trunk Movements Neck, shoulders, hips: None, normal, Overall Severity Severity of  abnormal movements (highest score from questions above): None, normal Incapacitation due to abnormal movements: None, normal Patient's awareness of abnormal movements (rate only patient's report): No Awareness, Dental Status Current problems with teeth and/or dentures?: No Does patient usually wear dentures?: No  CIWA:    COWS:     Musculoskeletal: Strength & Muscle Tone: within normal limits Gait & Station: normal Patient leans: N/A  Psychiatric Specialty Exam: Physical Exam  Nursing note and vitals reviewed. Constitutional: He is oriented to person, place, and time. He appears well-developed and well-nourished.  HENT:  Head: Normocephalic.  Neck: Normal range of motion.  Respiratory: Effort normal.  Musculoskeletal: Normal range of motion.  Neurological: He is alert and oriented to person, place, and time.  Psychiatric: His speech is normal and behavior is normal. Judgment and thought content normal. His affect is blunt. Cognition and memory are normal. He exhibits a depressed mood.    Review of Systems  Constitutional: Negative for chills and fever.  Respiratory: Negative for cough and shortness of breath.   Cardiovascular: Negative for chest pain.  Gastrointestinal: Negative for abdominal pain, heartburn, nausea and vomiting.  Psychiatric/Behavioral: Positive for depression and substance abuse. Negative for hallucinations and suicidal ideas. The patient does not have insomnia.   All other systems reviewed and are negative. Today denies dizziness or lightheadedness, no chest pain, no shortness of breath, no vomiting  Blood pressure (!) 131/92, pulse 70, temperature 98.3 F (36.8 C), temperature source Oral, resp. rate 18, height 6\' 2"  (1.88 m), weight 95.3 kg, SpO2 100 %.Body mass index is 26.96 kg/m.  General Appearance: Well Groomed  Eye Contact:  Good  Speech:  Normal Rate  Volume:  Normal  Mood:  Depressed   Affect:  Appropriate  Thought Process:  Linear and  Descriptions of Associations: Intact  Orientation:  Other:  he is alert, attentive and fully oriented x 3   Thought Content:  no hallucinations, no delusions   Suicidal Thoughts:  No denies any suicidal plan or intention, contracts for safety   Homicidal Thoughts:  No denies any homicidal or violent ideations  Memory:  recent and remote grossly intact   Judgement: Improving  Insight:  Fair/improving  Psychomotor Activity:  Normal  Concentration:  Concentration: Good and Attention Span: Good  Recall:  Good  Fund of Knowledge:  Good  Language:  Good  Akathisia:  No  Handed:    AIMS (if indicated):     Assets:  Physical Health Resilience  ADL's:  Intact  Cognition:  WNL  Sleep:  Number of Hours: 5.5   Assessment - 52 year old male, history of depression, prior suicide attempt by self cutting , anxiety,  substance abuse, who was admitted due to worsening depression and suicidal ideations.  At this time patient continues to present with slow improvement in mood..  Currently denies suicidal ideations.  Denies medication related side effects or somatic complaints.   Daily contact with patient to assess and evaluate symptoms and progress in treatment and Medication management .  - Continue inpatient hospitalization.  - Will continue today 11/25/2017 plan as below except where it is noted.  Encourage group and milieu participation to work on coping skills and symptom reduction.  Substance Abuse and Anxiety Continue Neurontin  100 mgrs TID for agitation/pain.  Continue  Vistaril  25 mgrs Q 6 hours PRN  Insomnia Continue Remeron 7.5 mg at bedtime for sleep  Hypertension Continue Norvasc 5mg  QDAY for HTN.  Depression Continue Trintellix 10 mg daily for depression.  Anemia Continue Ferrous Sulfate 325mg  QDAY for anemia.  Continue Naphcon-A 1 drop both eyes QID prn eye irritation.   Scolosis Continue Norco 10-325mg  1 tablet q4h prn severe pain (resuming home  regimen).  Treatment team working on disposition planning options  Patient is scheduled to be discharged to [facility] on Monday morning.  Nanine Means, NP, PMHNP 11/25/2017, 9:11 AM  Patient ID: [name], male   DOB: 04-23-1965, 52 y.o.   MRN: [id number]  RN: [id number]

## 2017-11-26 MED ORDER — HYDROXYZINE HCL 25 MG PO TABS: 25.0000 mg | ORAL_TABLET | Freq: Four times a day (QID) | ORAL | 0 refills | Status: DC | PRN

## 2017-11-26 MED ORDER — MIRTAZAPINE 7.5 MG PO TABS: 7.5000 mg | ORAL_TABLET | Freq: Every day | ORAL | 0 refills | Status: DC

## 2017-11-26 NOTE — [facility] Suicide Risk Assessment (Signed)
[facility] Discharge Suicide Risk Assessment   Principal Problem: MDD (major depressive disorder), recurrent severe, without psychosis (HCC) Discharge Diagnoses:  Patient Active Problem List   Diagnosis Date Noted  . Polysubstance dependence including opioid drug with daily use (HCC) [F19.20] 11/14/2017  . Suicidal ideation [R45.851] 11/13/2017  . Anxiety and depression [F41.9, F32.9]   . Acute renal failure (HCC) [N17.9]   . Altered mental status [R41.82]   . Cocaine use disorder (HCC) [F14.10]   . AKI (acute kidney injury) (HCC) [N17.9] 11/07/2017  . Leukocytosis [D72.829] 11/07/2017  . Chronic kidney disease (CKD), stage III (moderate) (HCC) [N18.3] 09/19/2016  . Elevated LFTs [R94.5] 09/19/2016  . Major depressive disorder, recurrent episode, severe (HCC) [F33.2] 08/24/2015  . Insomnia [G47.00]   . MDD (major depressive disorder), recurrent severe, without psychosis (HCC) [F33.2] 08/14/2015  . Cannabis use disorder, mild, abuse [F12.10] 08/14/2015  . Cocaine abuse (HCC) [F14.10] 08/10/2015  . Weight loss, non-intentional [R63.4] 08/10/2015  . Depression [F32.9] 08/10/2015  . Essential hypertension [I10]   . Vaccine refused by patient [Z28.20] 05/24/2015  . Routine general medical examination at a health care facility [Z00.00] 05/24/2015  . Chronic back pain [M54.9, G89.29] 05/24/2015  . Family history of cancer [Z80.9] 05/24/2015  . Screening for prostate cancer [Z12.5] 05/24/2015  . Smoker [F17.200] 05/24/2015  . Screen for colon cancer [Z12.11] 05/24/2015  . Erectile dysfunction [N52.9] 01/31/2014    Total Time spent with patient: 30 minutes  Musculoskeletal: Strength & Muscle Tone: within normal limits Gait & Station: normal Patient leans: N/A  Psychiatric Specialty Exam: Review of Systems  Constitutional: Negative for chills and fever.  Respiratory: Negative for cough and shortness of breath.   Cardiovascular: Negative for chest pain.  Gastrointestinal: Negative for  abdominal pain, heartburn, nausea and vomiting.  Psychiatric/Behavioral: Negative for depression, hallucinations and suicidal ideas. The patient is not nervous/anxious and does not have insomnia.     Blood pressure (!) 157/101, pulse 71, temperature 98.2 F (36.8 C), temperature source Oral, resp. rate 18, height 6\' 2"  (1.88 m), weight 95.3 kg, SpO2 100 %.Body mass index is 26.96 kg/m.  General Appearance: Casual and Fairly Groomed  Patent attorney::  Good  Speech:  Clear and Coherent and Normal Rate  Volume:  Normal  Mood:  Euthymic  Affect:  Appropriate and Congruent  Thought Process:  Coherent and Goal Directed  Orientation:  Full (Time, Place, and Person)  Thought Content:  Logical  Suicidal Thoughts:  No  Homicidal Thoughts:  No  Memory:  Immediate;   Fair Recent;   Fair Remote;   Fair  Judgement:  Fair  Insight:  Fair  Psychomotor Activity:  Normal  Concentration:  Good  Recall:  [employer] of Knowledge:Fair  Language: Good  Akathisia:  No  Handed:    AIMS (if indicated):     Assets:  Resilience Social Support  Sleep:  Number of Hours: 5.25  Cognition: WNL  ADL's:  Intact   Mental Status Per Nursing Assessment::   On Admission:  NA  Demographic Factors:  Male and Low socioeconomic status  Loss Factors: NA  Historical Factors: Impulsivity  Risk Reduction Factors:   Positive social support, Positive therapeutic relationship and Positive coping skills or problem solving skills  Continued Clinical Symptoms:  Depression:   Insomnia  Cognitive Features That Contribute To Risk:  None    Suicide Risk:  Minimal: No identifiable suicidal ideation.  Patients presenting with no risk factors but with morbid ruminations; may be classified as  minimal risk based on the severity of the depressive symptoms  Follow-up Information    [facility]. Go on 11/26/2017.   Why:  You have been accepted to this program and need to go straight from the hospital to the  facility upon discharge. Contact information: [address] [place]  [address] [phone]        Subjective Data: Benjamin [name] is a 52 y/o M with history of MDD and polysubstance abuse who was admitted voluntarily from [facility] with worsening depression and SI without plan. Pt was recently discharged from [facility] on 8/27 with plan to follow up at [facility] for substance use treatment, but he was told he would not be able to be continued on his prescription opiate pain medications at time of intake, so he left the program and began to have worsening depression and SI. He was medically cleared and then transferred to [facility]. He was restarted on previous discharge medications. He had improvement of presenting SI and was future oriented about discharge to the [facility] program.  Today upon evaluation, pt shares, "I'm good." He is looking forward to discharge tot he [facility]. He denies SI/HI/AH/VH. He denies physical complaints. He is in agreement to continue his current regimen without changes. He was able to engage in safety planning including plan to return to [facility] or contact emergency services if he feels unable to maintain his own safety or the safety of others. Pt had no further questions, comments, or concerns.   Plan Of Care/Follow-up recommendations:   - Discharge to outpatient level of care  Substance Abuse and Anxiety Continue Neurontin  100 mgrs TID for agitation/pain.  Continue  Vistaril  25 mgrs Q 6 hours PRN  Insomnia Continue Remeron 7.5 mg at bedtime for sleep  Hypertension Continue Norvasc 5mg  QDAY for HTN.  Depression Continue Trintellix 10 mg daily for depression.  Anemia Continue Ferrous Sulfate 325mg  QDAY for anemia.  Continue Naphcon-A 1 drop both eyes QID prn eye irritation.   Scolosis Continue Norco 10-325mg  1 tablet q4h prn severe pain (resuming home regimen).  Activity:  as tolerated Diet:  normal Tests:  NA Other:  see above for DC  plan  Micheal Likens, MD 11/26/2017, 9:29 AM

## 2017-11-26 NOTE — Discharge Summary (Addendum)
Physician Discharge Summary Note  Patient:  Benjamin [name] is an 52 y.o., male MRN:  [id number] DOB:  [birth year] Patient phone:  [phone] (home)  Patient address:   [address] [place] [address],  Total Time spent with patient: 15 minutes  Date of Admission:  11/22/2017 Date of Discharge: 11/26/2017  Reason for Admission: Per assessment note:Benjamin [name] is a 52 y/o M with history of MDD and polysubstance abuse who was admitted voluntarily from [facility] with worsening depression and SI without plan. Pt was recently discharged from [facility] on 8/27 with plan to follow up at [facility] for substance use treatment, but he was told he would not be able to be continued on his prescription opiate pain medications at time of intake, so he left the program and began to have worsening depression and SI. He was medically cleared and then transferred to [facility].  Principal Problem: MDD (major depressive disorder), recurrent severe, without psychosis [facility]) Discharge Diagnoses: Patient Active Problem List   Diagnosis Date Noted  . Polysubstance dependence including opioid drug with daily use (HCC) [F19.20] 11/14/2017  . Suicidal ideation [R45.851] 11/13/2017  . Anxiety and depression [F41.9, F32.9]   . Acute renal failure (HCC) [N17.9]   . Altered mental status [R41.82]   . Cocaine use disorder (HCC) [F14.10]   . AKI (acute kidney injury) (HCC) [N17.9] 11/07/2017  . Leukocytosis [D72.829] 11/07/2017  . Chronic kidney disease (CKD), stage III (moderate) (HCC) [N18.3] 09/19/2016  . Elevated LFTs [R94.5] 09/19/2016  . Major depressive disorder, recurrent episode, severe (HCC) [F33.2] 08/24/2015  . Insomnia [G47.00]   . MDD (major depressive disorder), recurrent severe, without psychosis (HCC) [F33.2] 08/14/2015  . Cannabis use disorder, mild, abuse [F12.10] 08/14/2015  . Cocaine abuse (HCC) [F14.10] 08/10/2015  . Weight loss, non-intentional [R63.4] 08/10/2015  . Depression [F32.9] 08/10/2015  .  Essential hypertension [I10]   . Vaccine refused by patient [Z28.20] 05/24/2015  . Routine general medical examination at a health care facility [Z00.00] 05/24/2015  . Chronic back pain [M54.9, G89.29] 05/24/2015  . Family history of cancer [Z80.9] 05/24/2015  . Screening for prostate cancer [Z12.5] 05/24/2015  . Smoker [F17.200] 05/24/2015  . Screen for colon cancer [Z12.11] 05/24/2015  . Erectile dysfunction [N52.9] 01/31/2014    Past Psychiatric History:   Past Medical History:  Past Medical History:  Diagnosis Date  . Chronic back pain    [facility]  . CKD (chronic kidney disease), stage III (HCC) 10/2017  . Erectile dysfunction   . History of substance abuse   . Hx of suicide attempt   . Hypertension     Past Surgical History:  Procedure Laterality Date  . ANKLE SURGERY     right  . APPLICATION OF WOUND VAC Right 08/17/2015   Procedure: APPLICATION OF WOUND VAC;  Surgeon: Dominica Severin, MD;  Location: [facility];  Service: Orthopedics;  Laterality: Right;  . HAND SURGERY     fracture, ORIG, teenage years, right.  . I&D EXTREMITY Right 08/10/2015   Procedure: IRRIGATION AND DEBRIDEMENT SKIN, SUBCUTANEOS TISSUE AND  MUSCLE, CARPAL TUNNEL RELEASE, MEDIAN NERVE LYSIS WITH NERVE WRAP;  Surgeon: Dominica Severin, MD;  Location: [facility];  Service: Orthopedics;  Laterality: Right;  . INCISION AND DRAINAGE OF WOUND Right 08/15/2015   Procedure: IRRIGATION AND DEBRIDEMENT WOUND;  Surgeon: Francena Hanly, MD;  Location: [facility];  Service: Orthopedics;  Laterality: Right;  . LUMBAR EPIDURAL INJECTION     [facility]  . SKIN SPLIT  GRAFT Right 08/17/2015   Procedure: I&D RIGHT FOREARM/POSSIBLE SKIN GRAFT;  Surgeon: Dominica Severin, MD;  Location: [facility];  Service: Orthopedics;  Laterality: Right;  . TENDON REPAIR N/A 07/24/2015   Procedure: WRIST LACERATION AND TENDON REPAIR;  Surgeon: Bradly Bienenstock, MD;  Location: [facility];  Service: Orthopedics;  Laterality: N/A;   . WOUND EXPLORATION Right 08/15/2015   Procedure: WOUND EXPLORATION right forearm;  Surgeon: Francena Hanly, MD;  Location: [facility];  Service: Orthopedics;  Laterality: Right;   Family History:  Family History  Problem Relation Age of Onset  . Cancer Mother        lung  . Anxiety disorder Mother   . Cancer Father        throat  . Diabetes Paternal Grandmother   . Cancer Paternal Aunt   . Heart disease Neg Hx   . Stroke Neg Hx    Family Psychiatric  History:  Social History:  Social History   Substance and Sexual Activity  Alcohol Use Yes  . Alcohol/week: 0.0 standard drinks   Comment: occasional     Social History   Substance and Sexual Activity  Drug Use Yes  . Types: Marijuana, Cocaine, Heroin   Comment: last use cocaine 08/08/15, last use MJ 08/08/15    Social History   Socioeconomic History  . Marital status: Single    Spouse name: Not on file  . Number of children: Not on file  . Years of education: Not on file  . Highest education level: Not on file  Occupational History  . Not on file  Social Needs  . Financial resource strain: Not on file  . Food insecurity:    Worry: Not on file    Inability: Not on file  . Transportation needs:    Medical: Not on file    Non-medical: Not on file  Tobacco Use  . Smoking status: Current Every Day Smoker    Packs/day: 0.50    Years: 6.00    Pack years: 3.00    Types: Cigarettes  . Smokeless tobacco: Never Used  Substance and Sexual Activity  . Alcohol use: Yes    Alcohol/week: 0.0 standard drinks    Comment: occasional  . Drug use: Yes    Types: Marijuana, Cocaine, Heroin    Comment: last use cocaine 08/08/15, last use MJ 08/08/15  . Sexual activity: Yes  Lifestyle  . Physical activity:    Days per week: Not on file    Minutes per session: Not on file  . Stress: Not on file  Relationships  . Social connections:    Talks on phone: Not on file    Gets together: Not on file    Attends religious service: Not  on file    Active member of club or organization: Not on file    Attends meetings of clubs or organizations: Not on file    Relationship status: Not on file  Other Topics Concern  . 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   No children.  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   09/2017.     Hospital Course:  Benjamin [name] was admitted for MDD (major depressive disorder), recurrent severe, without psychosis (HCC) and crisis management.  Pt was treated discharged with the medications listed below under Medication List.  Medical problems were identified and treated as needed.  Home medications were restarted as appropriate.  Improvement was monitored by  observation and Benjamin [name] 's daily report of symptom reduction.  Emotional and mental status was monitored by daily self-inventory reports completed by Benjamin [name] and clinical staff.         Benjamin [name] was evaluated by the treatment team for stability and plans for continued recovery upon discharge. Benjamin [name] 's motivation was an integral factor for scheduling further treatment. Employment, transportation, bed availability, health status, family support, and any pending legal issues were also considered during hospital stay. Pt was offered further treatment options upon discharge including but not limited to Residential, Intensive Outpatient, and Outpatient treatment.  Benjamin [name] will follow up with the services as listed below under Follow Up Information.     Upon completion of this admission the patient was both mentally and medically stable for discharge denying suicidal/homicidal ideation, auditory/visual/tactile hallucinations, delusional thoughts and paranoia.    Benjamin [name] responded well to treatment with Trintellix 10 mg and Remeron 7.5mg  without adverse effects.Pt demonstrated improvement without reported or observed adverse effects to  the point of stability appropriate for outpatient management. Pertinent labs include:CMP and CBC for which outpatient follow-up is necessary for lab recheck as mentioned below. Reviewed CBC, CMP, BAL, and UDS + opiates; all unremarkable aside from noted exceptions.   Physical Findings: AIMS: Facial and Oral Movements Muscles of Facial Expression: None, normal Lips and Perioral Area: None, normal Jaw: None, normal Tongue: None, normal,Extremity Movements Upper (arms, wrists, hands, fingers): None, normal Lower (legs, knees, ankles, toes): None, normal, Trunk Movements Neck, shoulders, hips: None, normal, Overall Severity Severity of abnormal movements (highest score from questions above): None, normal Incapacitation due to abnormal movements: None, normal Patient's awareness of abnormal movements (rate only patient's report): No Awareness, Dental Status Current problems with teeth and/or dentures?: No Does patient usually wear dentures?: No  CIWA:    COWS:     Musculoskeletal: Strength & Muscle Tone: within normal limits Gait & Station: normal Patient leans: N/A  Psychiatric Specialty Exam: See SRA by MD Physical Exam  Constitutional: He is oriented to person, place, and time. He appears well-developed.  Cardiovascular: Normal rate.  Neurological: He is alert and oriented to person, place, and time.  Psychiatric: He has a normal mood and affect. His behavior is normal.    Review of Systems  Psychiatric/Behavioral: Positive for substance abuse. Negative for depression. The patient is not nervous/anxious.   All other systems reviewed and are negative.   Blood pressure (!) 157/101, pulse 71, temperature 98.2 F (36.8 C), temperature source Oral, resp. rate 18, height 6\' 2"  (1.88 m), weight 95.3 kg, SpO2 100 %.Body mass index is 26.96 kg/m.    Have you used any form of tobacco in the last 30 days? (Cigarettes, Smokeless Tobacco, Cigars, and/or Pipes): Yes  Has this patient used  any form of tobacco in the last 30 days? (Cigarettes, Smokeless Tobacco, Cigars, and/or Pipes) Yes, Yes, A prescription for an FDA-approved tobacco cessation medication was offered at discharge and the patient refused  Blood Alcohol level:  Lab Results  Component Value Date   [facility] <10 11/22/2017   ETH <10 11/07/2017    Metabolic Disorder Labs:  Lab Results  Component Value Date   HGBA1C 5.6 08/31/2016   MPG 114 08/31/2016   MPG 120 (H) 05/24/2015   No results found for: PROLACTIN Lab Results  Component Value Date   CHOL 254 (H) 10/08/2017   TRIG 183 (H) 10/08/2017   HDL 58 10/08/2017   CHOLHDL 4.4 10/08/2017   VLDL  33 (H) 08/31/2016   LDLCALC 159 (H) 10/08/2017   LDLCALC 129 (H) 08/31/2016    See Psychiatric Specialty Exam and Suicide Risk Assessment completed by Attending Physician prior to discharge.  Discharge destination:  Home  Is patient on multiple antipsychotic therapies at discharge:  No   Has Patient had three or more failed trials of antipsychotic monotherapy by history:  No  Recommended Plan for Multiple Antipsychotic Therapies: NA  Discharge Instructions    Diet - low sodium heart healthy   Complete by:  As directed    Discharge instructions   Complete by:  As directed    Take all medications as prescribed. Keep all follow-up appointments as scheduled.  Do not consume alcohol or use illegal drugs while on prescription medications. Report any adverse effects from your medications to your primary care provider promptly.  In the event of recurrent symptoms or worsening symptoms, call 911, a crisis hotline, or go to the nearest emergency department for evaluation.   Increase activity slowly   Complete by:  As directed      Allergies as of 11/26/2017   No Known Allergies     Medication List    TAKE these medications     Indication  amLODipine 5 MG tablet Commonly known as:  NORVASC Take 1 tablet (5 mg total) by mouth daily. For high blood pressure   Indication:  High Blood Pressure Disorder   ferrous sulfate 325 (65 FE) MG tablet Take 1 tablet (325 mg total) by mouth daily with breakfast. For anemia  Indication:  Anemia From Inadequate Iron in the Body   folic acid 1 MG tablet Commonly known as:  FOLVITE Take 1 tablet (1 mg total) by mouth daily. For folate deficiency  Indication:  Anemia From Inadequate Folic Acid   gabapentin 100 MG capsule Commonly known as:  NEURONTIN Take 1 capsule (100 mg total) by mouth 3 (three) times daily. Agitation  Indication:  Agitation   HYDROcodone-acetaminophen 10-325 MG tablet Commonly known as:  NORCO Take 1 tablet by mouth every 4 (four) hours as needed for severe pain.  Indication:  Pain   hydrOXYzine 25 MG tablet Commonly known as:  ATARAX/VISTARIL Take 1 tablet (25 mg total) by mouth every 6 (six) hours as needed for anxiety.  Indication:  Feeling Anxious, Pain   mirtazapine 7.5 MG tablet Commonly known as:  REMERON Take 1 tablet (7.5 mg total) by mouth at bedtime.  Indication:  Major Depressive Disorder   naphazoline-pheniramine 0.025-0.3 % ophthalmic solution Commonly known as:  NAPHCON-A Place 1 drop into both eyes 4 (four) times daily as needed for eye irritation.  Indication:  Benjamin Eyes   nicotine 21 mg/24hr patch Commonly known as:  NICODERM CQ - dosed in mg/24 hours Place 1 patch (21 mg total) onto the skin daily as needed (nicotine craving).  Indication:  Nicotine Addiction   senna-docusate 8.6-50 MG tablet Commonly known as:  Senokot-S Take 1 tablet by mouth 2 (two) times daily. For constipation  Indication:  Constipation   vortioxetine HBr 10 MG Tabs tablet Commonly known as:  TRINTELLIX Take 1 tablet (10 mg total) by mouth daily. For depression  Indication:  Major Depressive Disorder      Follow-up Information    [facility]. Go on 11/26/2017.   Why:  You have been accepted to this program and need to go straight from the hospital to the facility upon  discharge. Contact information: [address] [place]  [address] [phone]  Follow-up recommendations:  Activity:  as tolerated Diet:  heart healthy Other:  [facility] team working on disposition planning options  Comments:  Take all medications as prescribed. Keep all follow-up appointments as scheduled.  Do not consume alcohol or use illegal drugs while on prescription medications. Report any adverse effects from your medications to your primary care provider promptly.  In the event of recurrent symptoms or worsening symptoms, call 911, a crisis hotline, or go to the nearest emergency department for evaluation.   Signed: Oneta Rack, NP 11/26/2017, 9:26 AM   Patient seen, Suicide Assessment Completed.  Disposition Plan Reviewed

## 2017-11-26 NOTE — Plan of Care (Signed)
Pt met all goals. Problem: Education: Goal: Knowledge of [facility] Education information/materials will improve Outcome: Progressing Goal: Emotional status will improve Outcome: Progressing Goal: Mental status will improve Outcome: Progressing Goal: Verbalization of understanding the information provided will improve Outcome: Progressing   Problem: Activity: Goal: Interest or engagement in leisure activities will improve Outcome: Progressing Goal: Imbalance in normal sleep/wake cycle will improve Outcome: Progressing   Problem: Self-Concept: Goal: Will verbalize positive feelings about self Outcome: Progressing   Problem: Health Behavior/Discharge Planning: Goal: Identification of resources available to assist in meeting health care needs will improve Outcome: Progressing   Problem: Medication: Goal: Compliance with prescribed medication regimen will improve Outcome: Progressing

## 2017-11-26 NOTE — Progress Notes (Signed)
D: Pt A & O X 4. Denies SI, HI and AVH at this time. Rates his depression 5/10, hopelessness 4/10 and anxiety 3/10 when assessed. Reports his pain 4/10 when reassessed post PRN this AM.  D/C home as ordered. Picked up in lobby by his girlfriend. A: D/C instructions reviewed with pt including prescriptions and follow up appointments; compliance encouraged. All belongings from locker # 30 given to pt at time of departure. Scheduled medications given with verbal education and effects monitored. Safety checks maintained without incident till time of d/c.  R: Pt receptive to care. Compliant with medications when offered. Denies adverse drug reactions when assessed. Verbalized understanding related to d/c instructions. Signed belonging sheet in agreement with items received from locker. Ambulatory with a steady gait. Appears to be in no physical distress at time of departure.

## 2017-12-06 ENCOUNTER — Encounter: Payer: Self-pay | Admitting: Gastroenterology

## 2018-01-10 ENCOUNTER — Encounter: Payer: Self-pay | Admitting: Gastroenterology

## 2018-03-04 ENCOUNTER — Encounter: Payer: Self-pay | Admitting: Medical

## 2018-03-04 ENCOUNTER — Ambulatory Visit: Payer: BLUE CROSS/BLUE SHIELD | Admitting: Medical

## 2018-03-04 VITALS — BP 120/84 | HR 63 | Temp 98.1°F | Resp 16 | Ht 74.0 in | Wt 226.4 lb

## 2018-03-04 DIAGNOSIS — Z87891 Personal history of nicotine dependence: Secondary | ICD-10-CM

## 2018-03-04 DIAGNOSIS — I10 Essential (primary) hypertension: Secondary | ICD-10-CM

## 2018-03-04 DIAGNOSIS — E7800 Pure hypercholesterolemia, unspecified: Secondary | ICD-10-CM

## 2018-03-04 DIAGNOSIS — F1911 Other psychoactive substance abuse, in remission: Secondary | ICD-10-CM

## 2018-03-04 DIAGNOSIS — R600 Localized edema: Secondary | ICD-10-CM

## 2018-03-04 DIAGNOSIS — N183 Chronic kidney disease, stage 3 unspecified: Secondary | ICD-10-CM

## 2018-03-04 MED ORDER — LISINOPRIL 10 MG PO TABS: 10.0000 mg | ORAL_TABLET | Freq: Every day | ORAL | 3 refills | Status: DC

## 2018-03-04 MED ORDER — AMLODIPINE BESYLATE 5 MG PO TABS: 5.0000 mg | ORAL_TABLET | Freq: Every day | ORAL | 3 refills | Status: DC

## 2018-03-04 NOTE — Progress Notes (Signed)
Subjective: Chief Complaint  Patient presents with  . ankle swelling    wants blood work for liver,  back pain feet pain    Here for

## 2018-03-04 NOTE — Progress Notes (Signed)
Subjective: Chief Complaint  Patient presents with  . ankle swelling    wants blood work for liver,  back pain feet pain    Here with girlfriend  He reports in recent weeks having swelling in both ankles, worse right than left.   No pain, no injury or trauma.  He did start Suboxone a few weeks ago and since starting this has seen the increased swelling.  Denies CP, SOB, DOE, no palpitations.   He has been eating a lot of sweets, candy per girlfriend.   He denies protein supplements.   Last visit with [facility] fall 2018.    He wants to recheck cholesterol.   Been feeling ok in general.  No new c/o otherwise  Past Medical History:  Diagnosis Date  . Chronic back pain    [facility]  . CKD (chronic kidney disease), stage III (HCC) 10/2017  . Erectile dysfunction   . History of substance abuse (HCC)   . Hx of suicide attempt   . Hypertension    ROS as in subjective   Objective: BP 120/84   Pulse 63   Temp 98.1 F (36.7 C) (Oral)   Resp 16   Ht 6\' 2"  (1.88 m)   Wt 226 lb 6.4 oz (102.7 kg)   SpO2 96%   BMI 29.07 kg/m   Gen: wd, wn, nad Skin unremarkable Lungs clear Heart 2/6 brief systolic murmur heard best in upper borders, otherwise normal S1, S2 1+ bilat lower ext edema including ankles Pulses 2+    Assessment: Encounter Diagnoses  Name Primary?  . Chronic kidney disease (CKD), stage III (moderate) (HCC) Yes  . Essential hypertension   . Former smoker   . Lower extremity edema   . Elevated cholesterol   . History of substance abuse (HCC)      Plan: Edema-discussed possible causes.  He has known kidney disease, not compliant with lisinopril, blood pressure looks good today.  He also recently started Suboxone which could be playing a small role as well.  Labs today.  Restart back on lisinopril, continue amlodipine although this could be playing a role in the swelling as well.  Counseled on diet as he has been eating a lot of  sweets and high sugar foods.  Hypertension-continue amlodipine, add back lisinopril which he has been noncompliant with.  I reviewed his echocardiogram from 11/08/2017 showing LV ejection fraction 60 to 65%, systolic function normal, no regional wall motion abnormalities.  Labs today for cholesterol and blood counts as he had abnormalities in his labs in recent months  Glad to hear he quit smoking  He is seeing Suboxone clinic   Jonny RuizJohn was seen today for ankle swelling.  Diagnoses and all orders for this visit:  Chronic kidney disease (CKD), stage III (moderate) (HCC) -     Comprehensive metabolic panel -     CBC -     Lipid Panel  Essential hypertension -     Comprehensive metabolic panel -     CBC -     Lipid Panel  Former smoker  Lower extremity edema  Elevated cholesterol -     Lipid Panel  History of substance abuse (HCC)  Other orders -     lisinopril (PRINIVIL,ZESTRIL) 10 MG tablet; Take 1 tablet (10 mg total) by mouth daily. -     amLODipine (NORVASC) 5 MG tablet; Take 1 tablet (5 mg total) by mouth daily. For high blood pressure

## 2018-03-05 ENCOUNTER — Other Ambulatory Visit: Payer: Self-pay | Admitting: Medical

## 2018-03-05 DIAGNOSIS — R945 Abnormal results of liver function studies: Principal | ICD-10-CM

## 2018-03-05 DIAGNOSIS — R7989 Other specified abnormal findings of blood chemistry: Secondary | ICD-10-CM

## 2018-03-05 LAB — CBC
HEMOGLOBIN: 12.3 g/dL — AB (ref 13.0–17.7)
Hematocrit: 37.9 % (ref 37.5–51.0)
MCH: 24.2 pg — ABNORMAL LOW (ref 26.6–33.0)
MCHC: 32.5 g/dL (ref 31.5–35.7)
MCV: 75 fL — ABNORMAL LOW (ref 79–97)
Platelets: 243 10*3/uL (ref 150–450)
RBC: 5.09 x10E6/uL (ref 4.14–5.80)
RDW: 15.1 % (ref 12.3–15.4)
WBC: 4.2 10*3/uL (ref 3.4–10.8)

## 2018-03-05 LAB — COMPREHENSIVE METABOLIC PANEL
ALT: 37 IU/L (ref 0–44)
AST: 48 IU/L — ABNORMAL HIGH (ref 0–40)
Albumin/Globulin Ratio: 3.6 — ABNORMAL HIGH (ref 1.2–2.2)
Albumin: 5.1 g/dL (ref 3.5–5.5)
Alkaline Phosphatase: 40 IU/L (ref 39–117)
BUN/Creatinine Ratio: 11 (ref 9–20)
BUN: 18 mg/dL (ref 6–24)
Bilirubin Total: 1.6 mg/dL — ABNORMAL HIGH (ref 0.0–1.2)
CO2: 21 mmol/L (ref 20–29)
CREATININE: 1.63 mg/dL — AB (ref 0.76–1.27)
Calcium: 9.5 mg/dL (ref 8.7–10.2)
Chloride: 101 mmol/L (ref 96–106)
GFR calc Af Amer: 56 mL/min/{1.73_m2} — ABNORMAL LOW (ref >59)
GFR calc non Af Amer: 48 mL/min/{1.73_m2} — ABNORMAL LOW (ref >59)
Globulin, Total: 1.4 g/dL — ABNORMAL LOW (ref 1.5–4.5)
Glucose: 91 mg/dL (ref 65–99)
Potassium: 4.3 mmol/L (ref 3.5–5.2)
Sodium: 142 mmol/L (ref 134–144)
Total Protein: 6.5 g/dL (ref 6.0–8.5)

## 2018-03-05 LAB — LIPID PANEL
Chol/HDL Ratio: 4.8 ratio (ref 0.0–5.0)
Cholesterol, Total: 249 mg/dL — ABNORMAL HIGH (ref 100–199)
HDL: 52 mg/dL (ref >39)
LDL Calculated: 170 mg/dL — ABNORMAL HIGH (ref 0–99)
Triglycerides: 137 mg/dL (ref 0–149)
VLDL Cholesterol Cal: 27 mg/dL (ref 5–40)

## 2018-03-08 LAB — HEPATITIS PANEL, ACUTE
Hep A IgM: NEGATIVE
Hep B C IgM: NEGATIVE
Hep C Virus Ab: <0.1 s/co ratio (ref 0.0–0.9)
Hepatitis B Surface Ag: NEGATIVE

## 2018-03-08 LAB — SPECIMEN STATUS REPORT

## 2018-03-14 ENCOUNTER — Telehealth: Payer: Self-pay

## 2018-03-14 DIAGNOSIS — R945 Abnormal results of liver function studies: Principal | ICD-10-CM

## 2018-03-14 DIAGNOSIS — R7989 Other specified abnormal findings of blood chemistry: Secondary | ICD-10-CM

## 2018-03-14 NOTE — Telephone Encounter (Signed)
Referral placed.

## 2018-04-15 ENCOUNTER — Encounter: Payer: Self-pay | Admitting: Gastroenterology

## 2018-04-30 ENCOUNTER — Encounter: Payer: Self-pay | Admitting: Gastroenterology

## 2018-04-30 ENCOUNTER — Other Ambulatory Visit (INDEPENDENT_AMBULATORY_CARE_PROVIDER_SITE_OTHER): Payer: Medicaid Other

## 2018-04-30 ENCOUNTER — Ambulatory Visit (INDEPENDENT_AMBULATORY_CARE_PROVIDER_SITE_OTHER): Payer: BLUE CROSS/BLUE SHIELD | Admitting: Gastroenterology

## 2018-04-30 VITALS — BP 100/60 | HR 74 | Ht 74.0 in | Wt 219.0 lb

## 2018-04-30 DIAGNOSIS — Z800 Family history of malignant neoplasm of digestive organs: Secondary | ICD-10-CM | POA: Diagnosis not present

## 2018-04-30 DIAGNOSIS — R945 Abnormal results of liver function studies: Secondary | ICD-10-CM

## 2018-04-30 DIAGNOSIS — Z1211 Encounter for screening for malignant neoplasm of colon: Secondary | ICD-10-CM | POA: Diagnosis not present

## 2018-04-30 DIAGNOSIS — R7989 Other specified abnormal findings of blood chemistry: Secondary | ICD-10-CM

## 2018-04-30 DIAGNOSIS — D649 Anemia, unspecified: Secondary | ICD-10-CM | POA: Diagnosis not present

## 2018-04-30 DIAGNOSIS — D509 Iron deficiency anemia, unspecified: Secondary | ICD-10-CM

## 2018-04-30 LAB — CBC
HCT: 36.8 % — ABNORMAL LOW (ref 39.0–52.0)
Hemoglobin: 11.8 g/dL — ABNORMAL LOW (ref 13.0–17.0)
MCHC: 32.1 g/dL (ref 30.0–36.0)
MCV: 72.7 fl — ABNORMAL LOW (ref 78.0–100.0)
Platelets: 162 10*3/uL (ref 150.0–400.0)
RBC: 5.06 Mil/uL (ref 4.22–5.81)
RDW: 14.7 % (ref 11.5–15.5)
WBC: 4 10*3/uL (ref 4.0–10.5)

## 2018-04-30 LAB — HEPATIC FUNCTION PANEL
ALT: 33 U/L (ref 0–53)
AST: 30 U/L (ref 0–37)
Albumin: 5.1 g/dL (ref 3.5–5.2)
Alkaline Phosphatase: 37 U/L — ABNORMAL LOW (ref 39–117)
Bilirubin, Direct: 0.2 mg/dL (ref 0.0–0.3)
Total Bilirubin: 1.3 mg/dL — ABNORMAL HIGH (ref 0.2–1.2)
Total Protein: 7.1 g/dL (ref 6.0–8.3)

## 2018-04-30 LAB — PROTIME-INR
INR: 1.1 ratio — ABNORMAL HIGH (ref 0.8–1.0)
Prothrombin Time: 12.3 s (ref 9.6–13.1)

## 2018-04-30 LAB — BILIRUBIN, DIRECT: BILIRUBIN DIRECT: 0.2 mg/dL (ref 0.0–0.3)

## 2018-04-30 LAB — IBC PANEL
Iron: 135 ug/dL (ref 42–165)
Saturation Ratios: 38.7 % (ref 20.0–50.0)
Transferrin: 249 mg/dL (ref 212.0–360.0)

## 2018-04-30 LAB — GAMMA GT: GGT: 37 U/L (ref 7–51)

## 2018-04-30 LAB — IGA: IGA: 90 mg/dL (ref 68–378)

## 2018-04-30 LAB — CK: CK TOTAL: 478 U/L — AB (ref 7–232)

## 2018-04-30 NOTE — Progress Notes (Signed)
GASTROENTEROLOGY OUTPATIENT CLINIC VISIT   Primary Care Provider Benjamin Canavanysinger, David S, PA-C [address] [place] [id number] [phone]  Referring Provider Benjamin Canavanysinger, David S, PA-C [address] [place] [address] [phone]  Patient Profile: Benjamin HaagensenJohn [name] is a 53 y.o. male with a pmh significant for chronic renal insufficiency, chronic back pain (on disability), MDD, history of prior substance abuse on buprenorphine, hypertension, microcytic anemia.  The patient presents to the [facility] for an evaluation and management of problem([name]) noted below:  Problem List 1. Abnormal LFTs   2. Microcytic anemia   3. Colon cancer screening   4. Family hx of colon cancer     History of Present Illness: This is the patient'[name] first visit to the outpatient of our GI clinic.  His liver tests have fluctuated over the course of the last few years as documented below.  For the most part the patient has had an elevated bilirubin that seems to be unconjugated.  At times he has had elevations in his transferase.  He is not undergone a work-up in the past.  He describes never being told that he has significant liver disease.  Is never had pancreatic disease either.  He describes no family history of liver disease.  He has a history of tattoos and prior blood transfusions but has tested negative in the past for acute hepatitis A and hepatitis B and hepatitis C.  He has been on Wellbutrin for approximately 7 to 8 months.  He has been on buprenorphine since September.  However his fluctuation in liver tests has been ongoing longer than that.    The patient denies any issues with jaundice, scleral icterus, pruritus, darkened/amber urine, clay-colored stools, LE edema, hematemesis, coffee-ground emesis, abdominal distention, confusion, new generalized pruritus.  The patient describes previously using creatine as well as other supplements when he was working out but due to issues  of his kidney disease he is no longer taking those medications.  He is on no other significant supplementations.  The patient previously worked at [name] but due to degenerative joint disease is now moving towards disability.  The patient has never had an upper or lower endoscopy.  GI Review of Systems Positive as above Negative for pyrosis, dysphagia, odynophagia, nausea, vomiting, abdominal pain, jaundice, change in bowel habits, melena, hematochezia  Review of Systems General: Denies fevers/chills/weight loss HEENT: Denies oral lesions Cardiovascular: Denies chest pain/palpitations Pulmonary: Denies shortness of breath Gastroenterological: See HPI Genitourinary: Denies darkened urine or hematuria Hematological: Denies easy bruising/bleeding Endocrine: Denies temperature intolerance Dermatological: Denies jaundice Psychological: Mood is stable currently Musculoskeletal: Positive for chronic arthralgias   Medications Current Outpatient Medications  Medication Sig Dispense Refill  . amLODipine (NORVASC) 5 MG tablet Take 1 tablet (5 mg total) by mouth daily. For high blood pressure 90 tablet 3  . Buprenorphine HCl-Naloxone HCl 12-3 MG FILM Place 12 mg under the tongue daily.  0  . buPROPion (WELLBUTRIN XL) 300 MG 24 hr tablet Take 300 mg by mouth daily.    Marland Kitchen. lisinopril (PRINIVIL,ZESTRIL) 10 MG tablet Take 1 tablet (10 mg total) by mouth daily. 90 tablet 3   No current facility-administered medications for this visit.     Allergies No Known Allergies  Histories Past Medical History:  Diagnosis Date  . Chronic back pain    [facility]  . CKD (chronic kidney disease), stage III (HCC) 10/2017  . Erectile dysfunction   . History of substance abuse (HCC)   .  Hx of suicide attempt   . Hypertension    Past Surgical History:  Procedure Laterality Date  . ANKLE SURGERY     right  . APPLICATION OF WOUND VAC Right 08/17/2015   Procedure: APPLICATION OF  WOUND VAC;  Surgeon: Dominica Severin, MD;  [facility];  Service: Orthopedics;  Laterality: Right;  . HAND SURGERY     fracture, ORIG, teenage years, right.  . I&D EXTREMITY Right 08/10/2015   Procedure: IRRIGATION AND DEBRIDEMENT SKIN, SUBCUTANEOS TISSUE AND  MUSCLE, CARPAL TUNNEL RELEASE, MEDIAN NERVE LYSIS WITH NERVE WRAP;  Surgeon: Dominica Severin, MD;  Location: [facility];  Service: Orthopedics;  Laterality: Right;  . INCISION AND DRAINAGE OF WOUND Right 08/15/2015   Procedure: IRRIGATION AND DEBRIDEMENT WOUND;  Surgeon: Francena Hanly, MD;  Location: [facility];  Service: Orthopedics;  Laterality: Right;  . LUMBAR EPIDURAL INJECTION     [facility]  . SKIN SPLIT GRAFT Right 08/17/2015   Procedure: I&D RIGHT FOREARM/POSSIBLE SKIN GRAFT;  Surgeon: Dominica Severin, MD;  [facility]: [facility];  Service: Orthopedics;  Laterality: Right;  . TENDON REPAIR N/A 07/24/2015   Procedure: WRIST LACERATION AND TENDON REPAIR;  Surgeon: Bradly Bienenstock, MD;  [facility];  Service: Orthopedics;  Laterality: N/A;  . WOUND EXPLORATION Right 08/15/2015   Procedure: WOUND EXPLORATION right forearm;  Surgeon: Francena Hanly, MD;  Location[facility];  Service: Orthopedics;  Laterality: Right;   Social History   Socioeconomic History  . Marital status: Single    Spouse name: Not on file  . Number of children: 0  . Years of education: Not on file  . Highest education level: Not on file  Occupational History  . Occupation: unemployed  Social Needs  . Financial resource strain: Not on file  . Food insecurity:    Worry: Not on file    Inability: Not on file  . Transportation needs:    Medical: Not on file    Non-medical: Not on file  Tobacco Use  . Smoking status: Former Smoker    Packs/day: 0.50    Years: 6.00    Pack years: 3.00    Types: Cigarettes  . Smokeless tobacco: Never Used  Substance and Sexual Activity  . Alcohol use: Yes    Alcohol/week: 0.0 standard drinks    Comment: occasional  . Drug  use: Yes    Types: Marijuana, Cocaine, Heroin    Comment: last use cocaine 08/08/15, last use MJ 08/08/15  . Sexual activity: Yes  Lifestyle  . Physical activity:    Days per week: Not on file    Minutes per session: Not on file  . Stress: Not on file  Relationships  . Social connections:    Talks on phone: Not on file    Gets together: Not on file    Attends religious service: Not on file    Active member of club or organization: Not on file    Attends meetings of clubs or organizations: Not on file    Relationship status: Not on file  . Intimate partner violence:    Fear of current or ex partner: Not on file    Emotionally abused: Not on file    Physically abused: Not on file    Forced sexual activity: Not on file  Other Topics Concern  . 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   No children.  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   09/2017.    Family History  Problem Relation Age of Onset  . Cancer Mother        lung, smoker  . Anxiety disorder Mother   . Cancer Father        throat  . Diabetes Paternal Grandmother   . Cancer Paternal Aunt   . Colon cancer Paternal Aunt   . Heart disease Neg Hx   . Stroke Neg Hx   . Esophageal cancer Neg Hx   . Inflammatory bowel disease Neg Hx   . Liver disease Neg Hx   . Pancreatic cancer Neg Hx   . Rectal cancer Neg Hx   . Stomach cancer Neg Hx    I have reviewed his medical, social, and family history in detail and updated the electronic medical record as necessary.    PHYSICAL EXAMINATION  BP 100/60   Pulse 74   Ht 6\' 2"  (1.88 m)   Wt 219 lb (99.3 kg)   BMI 28.12 kg/m  Wt Readings from Last 3 Encounters:  04/30/18 219 lb (99.3 kg)  03/04/18 226 lb 6.4 oz (102.7 kg)  11/22/17 210 lb (95.3 kg)  GEN: NAD, well built individual with increased muscle mass in upper trunk, appears stated age, doesn't appear chronically ill, accompanied by  wife PSYCH: Cooperative, without pressured speech EYE: Conjunctivae pink, sclerae anicteric ENT: MMM, without oral ulcers, no erythema or exudates noted NECK: Supple CV: RR without R/Gs  RESP: CTAB posteriorly, without wheezing GI: NABS, soft, NT/ND, without rebound or guarding, no HSM appreciated MSK/EXT: No lower extremity edema SKIN: No jaundice, no spider angiomata NEURO:  Alert & Oriented x 3, no focal deficits, no evidence of asterixis   REVIEW OF DATA  I reviewed the following data at the time of this encounter:  GI Procedures and Studies  No relevant studies to review  Laboratory Studies    11/03/2014 15:09 05/24/2015 00:01 08/11/2015 04:36 08/12/2015 05:53 03/01/2016 13:08 08/31/2016 10:51 10/08/2017 15:31 11/07/2017 12:47 11/13/2017 06:13 11/22/2017 00:30 03/04/2018 08:59  AST 39 43 (H) 31 21 29  47 (H) 39 52 (H) 70 (H) 40 48 (H)  ALT 41 37 23 20 24  35 32 30 74 (H) 88 (H) 37  Total Protein 6.9 6.9 5.8 (L) 4.7 (L) 6.7 7.0 7.4 5.7 (L) 4.5 (L) 6.7 6.5  Bilirubin, Direct         0.1    Indirect Bilirubin         1.1 (H)    Total Bilirubin 2.1 (H) 1.4 (H) 2.4 (H) 1.4 (H) 1.5 (H) 1.8 (H) 1.5 (H) 3.5 (H) 1.2 1.5 (H) 1.6 (H)   Imaging Studies  No relevant studies to review   ASSESSMENT  Mr. Saurer is a 53 y.o. male with a pmh significant for chronic renal insufficiency, chronic back pain (on disability), MDD, history of prior substance abuse on buprenorphine, hypertension, microcytic anemia.  The patient is seen today for evaluation and management of:  1. Abnormal LFTs   2. Microcytic anemia   3. Colon cancer screening   4. Family hx of colon cancer    This is a hemodynamically and clinically stable patient who is referred for further evaluation of liver test abnormalities and in talking with him and review of the chart also found to have a microcytic anemia.  The etiology of the patient'[name] liver test abnormalities is not clearly delineated with the work-up that has occurred at this  point.  Interestingly it looks like he has  a indirect bilirubin elevation and I wonder if this patient may actually have an underlying Sullivan Lone syndrome or in the setting of a microcytic anemia if the patient actually has some sort of hemolytic anemia.  His anemia could be a result of his chronic renal insufficiency as well.  We will begin a laboratory work-up to rule out etiologies of intrinsic liver disease.  I will get him set evaluated for the possibility of needing immunization for hepatitis A and hepatitis B.  We will do a ultrasound of the liver to better sense of his liver and gallbladder and look at the echotexture.  Additional work-up will be dictated by the findings on his laboratories.  He also is due for colon cancer screening and with a microcytic anemia I am considering an upper endoscopy as well but would like to see what his repeat blood counts look like.  Even though he has a paternal aunt who has a history of colon cancer he does not meet criteria for increased risk screening right now and he would be screened as an average risk individual.  For now will set up colonoscopy and consider an upper endoscopy.  We discussed briefly the role of a liver biopsy at some point in the future for now we will hold on that.  All patient questions were answered, to the best of my ability, and the patient agrees to the aforementioned plan of action with follow-up as indicated.   PLAN  Laboratories as outlined below Liver Doppler ultrasound to be ordered Set up colonoscopy and will consider addition of upper endoscopy based on if patient has a persistent microcytic anemia and/or iron deficiency Hold on liver biopsy for now   Orders Placed This Encounter  Procedures  . US LIVER DOPPLER  . CBC  . Hepatic function panel  . TSH  . IBC panel  . Haptoglobin  . B12  . Folate  . IgA  . Tissue transglutaminase, IgA  . INR/PT  . ANA  . Anti-smooth muscle antibody, IgG  . Mitochondrial antibodies  .  Hepatitis A antibody, total  . Ceruloplasmin  . Hepatitis B Core Antibody, total  . CK (Creatine Kinase)  . Gamma GT  . Bilirubin, Direct  . Reticulocytes  . Ambulatory referral to Gastroenterology    New Prescriptions   No medications on file   Modified Medications   No medications on file    Planned Follow Up: No follow-ups on file.   Corliss Parish, MD [facility] Office # [phone]

## 2018-04-30 NOTE — Patient Instructions (Signed)
If you are age 8965 or older, your body mass index should be between 23-30. Your Body mass index is 28.12 kg/m. If this is out of the aforementioned range listed, please consider follow up with your Primary Care Provider.  If you are age 51 or younger, your body mass index should be between 19-25. Your Body mass index is 28.12 kg/m. If this is out of the aformentioned range listed, please consider follow up with your Primary Care Provider.   Your provider has requested that you go to the basement level for lab work before leaving today. Press "B" on the elevator. The lab is located at the first door on the left as you exit the elevator.  You have been scheduled for an endoscopy and colonoscopy. Please follow the written instructions given to you at your visit today. Please pick up your prep supplies at the pharmacy within the next 1-3 days. If you use inhalers (even only as needed), please bring them with you on the day of your procedure. Your physician has requested that you go to www.startemmi.com and enter the access code given to you at your visit today. This web site gives a general overview about your procedure. However, you should still follow specific instructions given to you by our office regarding your preparation for the procedure.  You have been scheduled for an abdominal ultrasound at [facility] (1st floor of hospital) on 05/06/2018 at 9:00am. Please arrive 15 minutes prior to your appointment for registration. Make certain not to have anything to eat or drink 6 hours prior to your appointment. Should you need to reschedule your appointment, please contact radiology at [phone]. This test typically takes about 30 minutes to perform.

## 2018-05-01 LAB — TSH: TSH: 1.69 u[IU]/mL (ref 0.35–4.50)

## 2018-05-01 LAB — VITAMIN B12: Vitamin B-12: 350 pg/mL (ref 211–911)

## 2018-05-01 LAB — FOLATE: Folate: 13.1 ng/mL (ref >5.9)

## 2018-05-02 LAB — ANTI-SMOOTH MUSCLE ANTIBODY, IGG: Actin (Smooth Muscle) Antibody (IGG): <20 U (ref <20)

## 2018-05-02 LAB — RETICULOCYTES
ABS Retic: 49200 cells/uL (ref 25000–9000)
Retic Ct Pct: 1 %

## 2018-05-02 LAB — ANA: Anti Nuclear Antibody(ANA): POSITIVE — AB

## 2018-05-02 LAB — HAPTOGLOBIN: Haptoglobin: <8 mg/dL — ABNORMAL LOW (ref 43–212)

## 2018-05-02 LAB — HEPATITIS A ANTIBODY, TOTAL: Hepatitis A AB,Total: NONREACTIVE

## 2018-05-02 LAB — ANTI-NUCLEAR AB-TITER (ANA TITER): ANA Titer 1: 1:40 {titer} — ABNORMAL HIGH

## 2018-05-02 LAB — CERULOPLASMIN: Ceruloplasmin: 20 mg/dL (ref 18–36)

## 2018-05-02 LAB — TISSUE TRANSGLUTAMINASE, IGA: (tTG) Ab, IgA: 1 U/mL

## 2018-05-02 LAB — MITOCHONDRIAL ANTIBODIES: Mitochondrial M2 Ab, IgG: <=20 U

## 2018-05-02 LAB — HEPATITIS B CORE ANTIBODY, TOTAL: Hep B Core Total Ab: NONREACTIVE

## 2018-05-03 ENCOUNTER — Other Ambulatory Visit: Payer: Self-pay

## 2018-05-03 ENCOUNTER — Encounter: Payer: Self-pay | Admitting: Gastroenterology

## 2018-05-03 ENCOUNTER — Ambulatory Visit (INDEPENDENT_AMBULATORY_CARE_PROVIDER_SITE_OTHER): Payer: BLUE CROSS/BLUE SHIELD | Admitting: Orthopaedic Surgery

## 2018-05-03 DIAGNOSIS — D509 Iron deficiency anemia, unspecified: Secondary | ICD-10-CM

## 2018-05-03 DIAGNOSIS — Z800 Family history of malignant neoplasm of digestive organs: Secondary | ICD-10-CM | POA: Insufficient documentation

## 2018-05-06 ENCOUNTER — Encounter (INDEPENDENT_AMBULATORY_CARE_PROVIDER_SITE_OTHER): Payer: Self-pay | Admitting: Orthopedic Surgery

## 2018-05-06 ENCOUNTER — Ambulatory Visit (INDEPENDENT_AMBULATORY_CARE_PROVIDER_SITE_OTHER): Payer: BLUE CROSS/BLUE SHIELD

## 2018-05-06 ENCOUNTER — Ambulatory Visit (HOSPITAL_COMMUNITY): Payer: BLUE CROSS/BLUE SHIELD

## 2018-05-06 ENCOUNTER — Ambulatory Visit (INDEPENDENT_AMBULATORY_CARE_PROVIDER_SITE_OTHER): Payer: BLUE CROSS/BLUE SHIELD | Admitting: Orthopedic Surgery

## 2018-05-06 VITALS — Ht 74.0 in | Wt 220.0 lb

## 2018-05-06 DIAGNOSIS — G8929 Other chronic pain: Secondary | ICD-10-CM | POA: Diagnosis not present

## 2018-05-06 DIAGNOSIS — M25511 Pain in right shoulder: Secondary | ICD-10-CM

## 2018-05-06 DIAGNOSIS — M19011 Primary osteoarthritis, right shoulder: Secondary | ICD-10-CM | POA: Diagnosis not present

## 2018-05-06 NOTE — Progress Notes (Signed)
Office Visit Note   Patient: Benjamin [name]           Date of Birth: 11-23-1965           MRN: [id number] Visit Date: 05/06/2018 Requested by: Benjamin Canavan, PA-C [address] [place], [place] [address] PCP: Benjamin Canavan, PA-C  Subjective: Chief Complaint  Patient presents with  . Right Shoulder - Pain    HPI: Benjamin [name] is a 53 year old patient with right shoulder pain.  Describes doing a lot of heavy weightlifting during his younger years.  He describes 1 episode where a 75 pound dumbbell fell on his right shoulder.  He is left-hand dominant.  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  Not really taking much in the way of medication for this problem because he has some kidney and liver issues which are being worked out by his primary care physician.  He states that he is having decreasing range of motion in that right shoulder.  Denies any neck symptoms or radicular symptoms              ROS: All systems reviewed are negative as they relate to the chief complaint within the history of present illness.  Patient denies  fevers or chills.   Assessment & Plan: Visit Diagnoses:  1. Chronic right shoulder pain   2. Arthritis of right shoulder region     Plan: Impression is right shoulder arthritis end-stage with type B2 glenoid.  Rotator cuff strength feels good.  Does have a pretty significant response to very minimal passive shoulder range of motion exercises.  Regular try a glenohumeral joint injection under ultrasound with Dr. Prince Rome today.  Follow-up with me as needed.  He has a lot of issues ongoing with medical management that would prevent any type of medical strategy from being initiated today.  I will see him back as needed.  Follow-Up Instructions: No follow-ups on file.   Orders:  Orders Placed This Encounter  Procedures  . XR Shoulder Right   No orders of the defined types were placed in this encounter.     Procedures: No procedures performed   Clinical  Data: No additional findings.  Objective: Vital Signs: Ht 6\' 2"  (1.88 m)   Wt 220 lb (99.8 kg)   BMI 28.25 kg/m   Physical Exam:   Constitutional: Patient appears well-developed HEENT:  Head: Normocephalic Eyes:EOM are normal Neck: Normal range of motion Cardiovascular: Normal rate Pulmonary/chest: Effort normal Neurologic: Patient is alert Skin: Skin is warm Psychiatric: Patient has normal mood and affect    Ortho Exam: Ortho exam demonstrates a very physically fit appearing patient with well-developed upper extremity shoulder and back musculature.  He is got some pain with passive range of motion of the left shoulder.  On the right-hand side external rotation is to about 25 degrees with good rotator cuff strength testing infraspinatus supraspinatus and subscap testing.  Motor or sensory function to the hand is intact.  Cervical spine range of motion is full.  Does have some coarseness and grinding with passive range of motion.  Specialty Comments:  No specialty comments available.  Imaging: Xr Shoulder Right  Result Date: 05/06/2018 AP outlet axillary right shoulder reviewed.  End-stage glenohumeral osteoarthritis is present with spurring of the inferior humeral head.  Shoulder is located.  There is some posterior glenoid wear noted making this type B2 glenoid.  Acromiohumeral distance maintained.  Visualized lung fields clear.    PMFS History: Patient Active Problem List  Diagnosis Date Noted  . Microcytic anemia 05/03/2018  . Family hx of colon cancer 05/03/2018  . Former smoker 03/04/2018  . Lower extremity edema 03/04/2018  . Elevated cholesterol 03/04/2018  . History of substance abuse (HCC) 03/04/2018  . Polysubstance dependence including opioid drug with daily use (HCC) 11/14/2017  . Suicidal ideation 11/13/2017  . Anxiety and depression   . Acute renal failure (HCC)   . Altered mental status   . Cocaine use disorder (HCC)   . AKI (acute kidney injury)  (HCC) 11/07/2017  . Leukocytosis 11/07/2017  . Chronic kidney disease (CKD), stage III (moderate) (HCC) 09/19/2016  . Abnormal LFTs 09/19/2016  . Major depressive disorder, recurrent episode, severe (HCC) 08/24/2015  . Insomnia   . MDD (major depressive disorder), recurrent severe, without psychosis (HCC) 08/14/2015  . Cannabis use disorder, mild, abuse 08/14/2015  . Cocaine abuse (HCC) 08/10/2015  . Weight loss, non-intentional 08/10/2015  . Depression 08/10/2015  . Essential hypertension   . Vaccine refused by patient 05/24/2015  . Routine general medical examination at a health care facility 05/24/2015  . Chronic back pain 05/24/2015  . Family history of cancer 05/24/2015  . Screening for prostate cancer 05/24/2015  . Smoker 05/24/2015  . Colon cancer screening 05/24/2015  . Erectile dysfunction 01/31/2014   Past Medical History:  Diagnosis Date  . Chronic back pain    [facility]  . CKD (chronic kidney disease), stage III (HCC) 10/2017  . Erectile dysfunction   . History of substance abuse (HCC)   . Hx of suicide attempt   . Hypertension     Family History  Problem Relation Age of Onset  . Cancer Mother        lung, smoker  . Anxiety disorder Mother   . Cancer Father        throat  . Diabetes Paternal Grandmother   . Cancer Paternal Aunt   . Colon cancer Paternal Aunt   . Heart disease Neg Hx   . Stroke Neg Hx   . Esophageal cancer Neg Hx   . Inflammatory bowel disease Neg Hx   . Liver disease Neg Hx   . Pancreatic cancer Neg Hx   . Rectal cancer Neg Hx   . Stomach cancer Neg Hx     Past Surgical History:  Procedure Laterality Date  . ANKLE SURGERY     right  . APPLICATION OF WOUND VAC Right 08/17/2015   Procedure: APPLICATION OF WOUND VAC;  Surgeon: Dominica Severin, MD;  [facility];  Service: Orthopedics;  Laterality: Right;  . HAND SURGERY     fracture, ORIG, teenage years, right.  . I&D EXTREMITY Right 08/10/2015    Procedure: IRRIGATION AND DEBRIDEMENT SKIN, SUBCUTANEOS TISSUE AND  MUSCLE, CARPAL TUNNEL RELEASE, MEDIAN NERVE LYSIS WITH NERVE WRAP;  Surgeon: Dominica Severin, MD;  Location: [facility];  Service: Orthopedics;  Laterality: Right;  . INCISION AND DRAINAGE OF WOUND Right 08/15/2015   Procedure: IRRIGATION AND DEBRIDEMENT WOUND;  Surgeon: Francena Hanly, MD;  Location: [facility];  Service: Orthopedics;  Laterality: Right;  . LUMBAR EPIDURAL INJECTION     [facility]  . SKIN SPLIT GRAFT Right 08/17/2015   Procedure: I&D RIGHT FOREARM/POSSIBLE SKIN GRAFT;  Surgeon: Dominica Severin, MD;  [facility]: [facility];  Service: Orthopedics;  Laterality: Right;  . TENDON REPAIR N/A 07/24/2015   Procedure: WRIST LACERATION AND TENDON REPAIR;  Surgeon: Bradly Bienenstock, MD;  Location: [facility];  Service: Orthopedics;  Laterality: N/A;  . WOUND EXPLORATION Right 08/15/2015   Procedure: WOUND EXPLORATION right forearm;  Surgeon: Francena HanlyKevin Supple, MD;  Location: [facility];  Service: Orthopedics;  Laterality: Right;   Social History   Occupational History  . Occupation: unemployed  Tobacco Use  . Smoking status: Former Smoker    Packs/day: 0.50    Years: 6.00    Pack years: 3.00    Types: Cigarettes  . Smokeless tobacco: Never Used  Substance and Sexual Activity  . Alcohol use: Yes    Alcohol/week: 0.0 standard drinks    Comment: occasional  . Drug use: Yes    Types: Marijuana, Cocaine, Heroin    Comment: last use cocaine 08/08/15, last use MJ 08/08/15  . Sexual activity: Yes

## 2018-05-10 ENCOUNTER — Ambulatory Visit (HOSPITAL_COMMUNITY)
Admission: RE | Admit: 2018-05-10 | Discharge: 2018-05-10 | Disposition: A | Discharge status: Home / Self Care | Payer: BLUE CROSS/BLUE SHIELD | Source: Ambulatory Visit | Attending: Gastroenterology | Admitting: Gastroenterology

## 2018-05-10 DIAGNOSIS — R7989 Other specified abnormal findings of blood chemistry: Secondary | ICD-10-CM

## 2018-05-10 DIAGNOSIS — R945 Abnormal results of liver function studies: Secondary | ICD-10-CM | POA: Insufficient documentation

## 2018-05-10 DIAGNOSIS — Z800 Family history of malignant neoplasm of digestive organs: Secondary | ICD-10-CM | POA: Insufficient documentation

## 2018-05-10 DIAGNOSIS — D509 Iron deficiency anemia, unspecified: Secondary | ICD-10-CM | POA: Insufficient documentation

## 2018-05-30 ENCOUNTER — Encounter: Payer: Self-pay | Admitting: Gastroenterology

## 2018-06-04 ENCOUNTER — Ambulatory Visit (INDEPENDENT_AMBULATORY_CARE_PROVIDER_SITE_OTHER): Payer: BLUE CROSS/BLUE SHIELD | Admitting: Orthopaedic Surgery

## 2018-06-20 ENCOUNTER — Telehealth: Payer: Self-pay | Admitting: Medical

## 2018-06-20 NOTE — Telephone Encounter (Signed)
Pt called and is wanting to know If he needs to contiune taking his BP medicine Norvasc states he bp has been running around 107/63 and he has stopped smoking and is eating better, pt can be reached at [phone]

## 2018-06-20 NOTE — Telephone Encounter (Signed)
Left message on voicemail for patient to call back. 

## 2018-06-20 NOTE — Telephone Encounter (Signed)
Assuming he continues the lisinopril, then yes he can stop the amlodipine for now and continue to monitor blood pressure

## 2018-06-21 NOTE — Telephone Encounter (Signed)
Left message on voicemail for patient to call back. 

## 2018-06-24 NOTE — Telephone Encounter (Signed)
Patient will not call back.

## 2018-07-18 ENCOUNTER — Encounter: Payer: Self-pay | Admitting: Medical

## 2019-10-14 ENCOUNTER — Other Ambulatory Visit: Payer: Self-pay | Admitting: Neurosurgery

## 2019-10-14 DIAGNOSIS — M5136 Other intervertebral disc degeneration, lumbar region: Secondary | ICD-10-CM

## 2019-11-02 ENCOUNTER — Ambulatory Visit
Admission: RE | Admit: 2019-11-02 | Discharge: 2019-11-02 | Disposition: A | Discharge status: Home / Self Care | Payer: Medicaid Other | Source: Ambulatory Visit | Attending: Neurosurgery | Admitting: Neurosurgery

## 2019-11-02 ENCOUNTER — Other Ambulatory Visit: Payer: Self-pay

## 2019-11-02 DIAGNOSIS — M5136 Other intervertebral disc degeneration, lumbar region: Secondary | ICD-10-CM

## 2020-04-28 ENCOUNTER — Telehealth: Payer: Self-pay

## 2020-04-28 NOTE — Telephone Encounter (Signed)
-----   Message from Lemar Lofty., MD sent at 04/28/2020  8:13 AM EST ----- Regarding: Follow up Benjamin [name], saw patient did not have follow up labs performed and they are expiring.  I leave that up to the patients at this point if they want to follow up or not.  However, we had scheduled for a colonoscopy in March 2020 so I don't know if because of COVID things were put on hold, but I don't see [place] trying to reach back out.  Can we see if he wants to get his colon cancer screening as he is overdue.  Thanks.  GM

## 2020-04-28 NOTE — Telephone Encounter (Signed)
The pt has transferred care due to insurance not being accepted at our facility.

## 2020-04-28 NOTE — Telephone Encounter (Signed)
Thanks for update. GM 

## 2022-06-20 ENCOUNTER — Emergency Department (HOSPITAL_COMMUNITY): Payer: Medicare Other

## 2022-06-20 ENCOUNTER — Other Ambulatory Visit: Payer: Self-pay

## 2022-06-20 ENCOUNTER — Emergency Department (EMERGENCY_DEPARTMENT_HOSPITAL)
Admission: EM | Admit: 2022-06-20 | Discharge: 2022-06-21 | Disposition: A | Discharge status: Other Acute Inpatient Hospital | Payer: Medicare Other | Source: Home / Self Care | Attending: Emergency Medicine | Admitting: Emergency Medicine

## 2022-06-20 DIAGNOSIS — F332 Major depressive disorder, recurrent severe without psychotic features: Secondary | ICD-10-CM | POA: Insufficient documentation

## 2022-06-20 DIAGNOSIS — Z20822 Contact with and (suspected) exposure to covid-19: Secondary | ICD-10-CM | POA: Diagnosis not present

## 2022-06-20 DIAGNOSIS — Z87891 Personal history of nicotine dependence: Secondary | ICD-10-CM | POA: Insufficient documentation

## 2022-06-20 DIAGNOSIS — Z560 Unemployment, unspecified: Secondary | ICD-10-CM | POA: Diagnosis not present

## 2022-06-20 DIAGNOSIS — Z79899 Other long term (current) drug therapy: Secondary | ICD-10-CM | POA: Insufficient documentation

## 2022-06-20 DIAGNOSIS — F411 Generalized anxiety disorder: Secondary | ICD-10-CM | POA: Diagnosis not present

## 2022-06-20 DIAGNOSIS — Z818 Family history of other mental and behavioral disorders: Secondary | ICD-10-CM | POA: Diagnosis not present

## 2022-06-20 DIAGNOSIS — G894 Chronic pain syndrome: Secondary | ICD-10-CM | POA: Diagnosis not present

## 2022-06-20 DIAGNOSIS — Z23 Encounter for immunization: Secondary | ICD-10-CM | POA: Insufficient documentation

## 2022-06-20 DIAGNOSIS — S61412A Laceration without foreign body of left hand, initial encounter: Secondary | ICD-10-CM | POA: Insufficient documentation

## 2022-06-20 DIAGNOSIS — Z1152 Encounter for screening for COVID-19: Secondary | ICD-10-CM | POA: Insufficient documentation

## 2022-06-20 DIAGNOSIS — R45851 Suicidal ideations: Secondary | ICD-10-CM | POA: Insufficient documentation

## 2022-06-20 DIAGNOSIS — E785 Hyperlipidemia, unspecified: Secondary | ICD-10-CM | POA: Diagnosis not present

## 2022-06-20 DIAGNOSIS — F1510 Other stimulant abuse, uncomplicated: Secondary | ICD-10-CM | POA: Diagnosis not present

## 2022-06-20 DIAGNOSIS — I129 Hypertensive chronic kidney disease with stage 1 through stage 4 chronic kidney disease, or unspecified chronic kidney disease: Secondary | ICD-10-CM | POA: Insufficient documentation

## 2022-06-20 DIAGNOSIS — Z9152 Personal history of nonsuicidal self-harm: Secondary | ICD-10-CM | POA: Diagnosis not present

## 2022-06-20 DIAGNOSIS — G4700 Insomnia, unspecified: Secondary | ICD-10-CM | POA: Diagnosis not present

## 2022-06-20 DIAGNOSIS — F1410 Cocaine abuse, uncomplicated: Secondary | ICD-10-CM | POA: Diagnosis not present

## 2022-06-20 DIAGNOSIS — X780XXA Intentional self-harm by sharp glass, initial encounter: Secondary | ICD-10-CM | POA: Insufficient documentation

## 2022-06-20 DIAGNOSIS — Z9151 Personal history of suicidal behavior: Secondary | ICD-10-CM | POA: Diagnosis not present

## 2022-06-20 DIAGNOSIS — Z5901 Sheltered homelessness: Secondary | ICD-10-CM | POA: Diagnosis not present

## 2022-06-20 DIAGNOSIS — N183 Chronic kidney disease, stage 3 unspecified: Secondary | ICD-10-CM | POA: Insufficient documentation

## 2022-06-20 DIAGNOSIS — F4310 Post-traumatic stress disorder, unspecified: Secondary | ICD-10-CM | POA: Diagnosis not present

## 2022-06-20 DIAGNOSIS — Z833 Family history of diabetes mellitus: Secondary | ICD-10-CM | POA: Diagnosis not present

## 2022-06-20 LAB — CBC WITH DIFFERENTIAL/PLATELET
Abs Immature Granulocytes: 0.03 10*3/uL (ref 0.00–0.07)
Basophils Absolute: 0 10*3/uL (ref 0.0–0.1)
Basophils Relative: 0 %
Eosinophils Absolute: 0 10*3/uL (ref 0.0–0.5)
Eosinophils Relative: 0 %
HCT: 39 % (ref 39.0–52.0)
Hemoglobin: 12.7 g/dL — ABNORMAL LOW (ref 13.0–17.0)
Immature Granulocytes: 0 %
Lymphocytes Relative: 22 %
Lymphs Abs: 1.7 10*3/uL (ref 0.7–4.0)
MCH: 24.3 pg — ABNORMAL LOW (ref 26.0–34.0)
MCHC: 32.6 g/dL (ref 30.0–36.0)
MCV: 74.6 fL — ABNORMAL LOW (ref 80.0–100.0)
Monocytes Absolute: 0.7 10*3/uL (ref 0.1–1.0)
Monocytes Relative: 9 %
Neutro Abs: 5.3 10*3/uL (ref 1.7–7.7)
Neutrophils Relative %: 69 %
Platelets: 233 10*3/uL (ref 150–400)
RBC: 5.23 MIL/uL (ref 4.22–5.81)
RDW: 15.8 % — ABNORMAL HIGH (ref 11.5–15.5)
WBC: 7.8 10*3/uL (ref 4.0–10.5)
nRBC: 0 % (ref 0.0–0.2)

## 2022-06-20 LAB — COMPREHENSIVE METABOLIC PANEL
ALT: 58 U/L — ABNORMAL HIGH (ref 0–44)
AST: 107 U/L — ABNORMAL HIGH (ref 15–41)
Albumin: 4.5 g/dL (ref 3.5–5.0)
Alkaline Phosphatase: 38 U/L (ref 38–126)
Anion gap: 9 (ref 5–15)
BUN: 24 mg/dL — ABNORMAL HIGH (ref 6–20)
CO2: 25 mmol/L (ref 22–32)
Calcium: 9.1 mg/dL (ref 8.9–10.3)
Chloride: 102 mmol/L (ref 98–111)
Creatinine, Ser: 1.61 mg/dL — ABNORMAL HIGH (ref 0.61–1.24)
GFR, Estimated: 51 mL/min — ABNORMAL LOW (ref >60)
Glucose, Bld: 98 mg/dL (ref 70–99)
Potassium: 3.4 mmol/L — ABNORMAL LOW (ref 3.5–5.1)
Sodium: 136 mmol/L (ref 135–145)
Total Bilirubin: 4.8 mg/dL — ABNORMAL HIGH (ref 0.3–1.2)
Total Protein: 6.8 g/dL (ref 6.5–8.1)

## 2022-06-20 LAB — RAPID URINE DRUG SCREEN, HOSP PERFORMED
Amphetamines: NOT DETECTED
Barbiturates: NOT DETECTED
Benzodiazepines: POSITIVE — AB
Cocaine: POSITIVE — AB
Opiates: NOT DETECTED
Tetrahydrocannabinol: NOT DETECTED

## 2022-06-20 LAB — ACETAMINOPHEN LEVEL: Acetaminophen (Tylenol), Serum: <10 ug/mL — ABNORMAL LOW (ref 10–30)

## 2022-06-20 LAB — RESP PANEL BY RT-PCR (RSV, FLU A&B, COVID)  RVPGX2
Influenza A by PCR: NEGATIVE
Influenza B by PCR: NEGATIVE
Resp Syncytial Virus by PCR: NEGATIVE
SARS Coronavirus 2 by RT PCR: NEGATIVE

## 2022-06-20 LAB — SALICYLATE LEVEL: Salicylate Lvl: <7 mg/dL — ABNORMAL LOW (ref 7.0–30.0)

## 2022-06-20 LAB — ETHANOL: Alcohol, Ethyl (B): <10 mg/dL (ref <10)

## 2022-06-20 MED ORDER — SODIUM CHLORIDE 0.9 % IV BOLUS
1000.0000 mL | Freq: Once | INTRAVENOUS | Status: AC
  Administered 2022-06-20: 1000 mL via INTRAVENOUS

## 2022-06-20 MED ORDER — HYDROXYZINE HCL 25 MG PO TABS: 25.0000 mg | ORAL_TABLET | Freq: Three times a day (TID) | ORAL | Status: DC | PRN

## 2022-06-20 MED ORDER — AMLODIPINE BESYLATE 5 MG PO TABS
5.0000 mg | ORAL_TABLET | Freq: Once | ORAL | Status: AC
  Administered 2022-06-20: 5 mg via ORAL
  Filled 2022-06-20: qty 1

## 2022-06-20 MED ORDER — TRAZODONE HCL 50 MG PO TABS: 50.0000 mg | ORAL_TABLET | Freq: Every evening | ORAL | Status: DC | PRN

## 2022-06-20 MED ORDER — TETANUS-DIPHTH-ACELL PERTUSSIS 5-2.5-18.5 LF-MCG/0.5 IM SUSY
0.5000 mL | PREFILLED_SYRINGE | Freq: Once | INTRAMUSCULAR | Status: AC
  Administered 2022-06-20: 0.5 mL via INTRAMUSCULAR
  Filled 2022-06-20: qty 0.5

## 2022-06-20 MED ORDER — LISINOPRIL 10 MG PO TABS
10.0000 mg | ORAL_TABLET | Freq: Once | ORAL | Status: AC
  Administered 2022-06-20: 10 mg via ORAL
  Filled 2022-06-20: qty 1

## 2022-06-20 MED ORDER — BUPROPION HCL ER (XL) 150 MG PO TB24
150.0000 mg | ORAL_TABLET | Freq: Every day | ORAL | Status: DC
  Administered 2022-06-20: 150 mg via ORAL
  Filled 2022-06-20 (×2): qty 1

## 2022-06-20 NOTE — ED Notes (Signed)
Patient called wife to inform of impending transfer later tonight; Patient signed voluntary consent form-Monique,RN

## 2022-06-20 NOTE — Consult Note (Signed)
[facility] ED ASSESSMENT   Reason for Consult:  SI Referring Physician:  [place] Patient Identification: Benjamin [name] MRN:  [id number] ED Chief Complaint: MDD (major depressive disorder), recurrent severe, without psychosis ([place])  Diagnosis:  Principal Problem:   MDD (major depressive disorder), recurrent severe, without psychosis ([place])   ED Assessment Time Calculation: Start Time: 1700 Stop Time: 1800 Total Time in Minutes (Assessment Completion): 60   HPI:   Benjamin [name] is a 57 y.o. male patient with a past medical history significant for depression, hypertension, polysubstance abuse, and anxiety who presents to the ED due to suicidal ideations.  Patient notes he has thoughts of killing himself with a plan to cut himself.  Previous history of self harm. Patient notes he has attempted suicide in the past.  He notes he cut his left hand yesterday afternoon with a piece of glass.  Unsure when his last tetanus shot was.  Patient notes he left home on Saturday and has been "walking around".  Per [employer] at bedside, patient's girlfriend called police on [phone] due to patient being missing.  Patient notes he has not taken any of his home medications since leaving his house on Saturday. He notes he has not been on his Wellbutrin for numerous months. Takes Xanax periodically, took numerous when he started feeling depressed 5 days ago. Denies HI.  No auditory or visual hallucinations.  Admits to marijuana use and notes he possibly had a blunt with cocaine in it this weekend.  Admits to drinking a few 40 ounce beers over the weekend.  Denies daily alcohol use. No physical complaints.   Pt reported previous suicide attempt around 4 years ago when he had severe overdose and significant laceration to left wrist. Pt states he has been on Welbutrin since this attempt, and has managed well in outpatient. Pt stopped taking his Welbutrin a few months ago and depressive symptoms have worsened.   Subjective:   Patient seen  at [facility] for face-to-face psychiatric evaluation.  He confirms above, stating around Saturday his depressive symptoms significantly worsened.  He is unable to identify any triggers or major stressors that could have caused the worsening depression.  He reports he does not usually use illicit substances however on Saturday he decided to smoke marijuana that had cocaine in it, and drink around 40 ounces of beer.  He states he has been prescribed Xanax in the past and still had a bottle, he has been using this more often as well to manage his anxiety and depressive symptoms.  He reports being off his Wellbutrin for a few months, and since stopping his Wellbutrin his depressive symptoms have worsened.  He does tell me about his suicide attempt around 4 years ago where he states he almost died.  Patient stated this go round he was more aware of how he was feeling and wanted to present to the hospital before he "did something stupid."  Patient stated he started having intrusive thoughts to kill himself which is why he presented to the ED.  He did have a plan to cut himself again, he did create a laceration on his left hand that he shows me.  Patient is wanting to seek help and to restart medications.  He denies homicidal ideations.  Denies any auditory or visual hallucinations.  Reports poor sleep and fluctuating appetite.  He is agreeable with plan to restart Wellbutrin, and for inpatient psychiatric treatment for further stabilization and medication management.  Patient stated 4 years ago after  inpatient treatment he went to the [facility] program in [place] and is currently coordinating return there with his wife for extra recovery after inpatient treatment.   Past Psychiatric History:  MDD, GAD, PTSD  Risk to Self or Others: Is the patient at risk to self? Yes Has the patient been a risk to self in the past 6 months? No Has the patient been a risk to self within the distant past? Yes Is the patient a  risk to others? No Has the patient been a risk to others in the past 6 months? No Has the patient been a risk to others within the distant past? No  [place] Scale:  [facility] ED from 06/20/2022 in [facility] Department at [facility] ED from 11/22/2017 in [facility] Emergency Department at [facility] Admission (Discharged) from 11/13/2017 in [place] [facility]  C-SSRS RISK CATEGORY High Risk High Risk High Risk      Past Medical History:  Past Medical History:  Diagnosis Date   Chronic back pain    managed by [facility]   CKD (chronic kidney disease), stage III ([place]) 10/2017   Erectile dysfunction    History of substance abuse ([place])    Hx of suicide attempt    Hypertension     Past Surgical History:  Procedure Laterality Date   ANKLE SURGERY     right   APPLICATION OF WOUND VAC Right 08/17/2015   Procedure: APPLICATION OF WOUND VAC;  Surgeon: Roseanne Kaufman, MD;  Location: [facility];  Service: Orthopedics;  Laterality: Right;   HAND SURGERY     fracture, ORIG, teenage years, right.   I & D EXTREMITY Right 08/10/2015   Procedure: IRRIGATION AND DEBRIDEMENT SKIN, SUBCUTANEOS TISSUE AND  MUSCLE, CARPAL TUNNEL RELEASE, MEDIAN NERVE LYSIS WITH NERVE WRAP;  Surgeon: Roseanne Kaufman, MD;  Location: [facility];  Service: Orthopedics;  Laterality: Right;   INCISION AND DRAINAGE OF WOUND Right 08/15/2015   Procedure: IRRIGATION AND DEBRIDEMENT WOUND;  Surgeon: Justice Britain, MD;  Location: [facility];  Service: Orthopedics;  Laterality: Right;   LUMBAR EPIDURAL INJECTION     [facility]   SKIN SPLIT GRAFT Right 08/17/2015   Procedure: I&D RIGHT FOREARM/POSSIBLE SKIN GRAFT;  Surgeon: Roseanne Kaufman, MD;  Location: [facility];  Service: Orthopedics;  Laterality: Right;   TENDON REPAIR N/A 07/24/2015   Procedure: WRIST LACERATION AND TENDON REPAIR;  Surgeon: Iran Planas, MD;  Location: [facility];  Service: Orthopedics;  Laterality:  N/A;   WOUND EXPLORATION Right 08/15/2015   Procedure: WOUND EXPLORATION right forearm;  Surgeon: Justice Britain, MD;  Location: [facility];  Service: Orthopedics;  Laterality: Right;   Family History:  Family History  Problem Relation Age of Onset   Cancer Mother        lung, smoker   Anxiety disorder Mother    Cancer Father        throat   Diabetes Paternal Grandmother    Cancer Paternal Aunt    Colon cancer Paternal Aunt    Heart disease Neg Hx    Stroke Neg Hx    Esophageal cancer Neg Hx    Inflammatory bowel disease Neg Hx    Liver disease Neg Hx    Pancreatic cancer Neg Hx    Rectal cancer Neg Hx    Stomach cancer Neg Hx    Social History:  Social History   Substance and Sexual Activity  Alcohol Use Yes  Alcohol/week: 0.0 standard drinks of alcohol   Comment: occasional     Social History   Substance and Sexual Activity  Drug Use Yes   Types: Marijuana, Cocaine, Heroin   Comment: last use cocaine 08/08/15, last use MJ 08/08/15    Social History   Socioeconomic History   Marital status: Single    Spouse name: Not on file   Number of children: 0   Years of education: Not on file   Highest education level: Not on file  Occupational History   Occupation: unemployed  Tobacco Use   Smoking status: Former    Packs/day: 0.50    Years: 6.00    Additional pack years: 0.00    Total pack years: 3.00    Types: Cigarettes   Smokeless tobacco: Never  Vaping Use   Vaping Use: Never used  Substance and Sexual Activity   Alcohol use: Yes    Alcohol/week: 0.0 standard drinks of alcohol    Comment: occasional   Drug use: Yes    Types: Marijuana, Cocaine, Heroin    Comment: last use cocaine 08/08/15, last use MJ 08/08/15   Sexual activity: Yes  Other Topics Concern   Not on file  Social History Narrative   Single,prior worked as Teacher, 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   No children.  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   09/2017.    Social  Determinants of Health   Financial Resource Strain: Not on file  Food Insecurity: Not on file  Transportation Needs: Not on file  Physical Activity: Not on file  Stress: Not on file  Social Connections: Not on file    Allergies:  No Known Allergies  Labs:  Results for orders placed or performed during the hospital encounter of 06/20/22 (from the past 48 hour(s))  Comprehensive metabolic panel     Status: Abnormal   Collection Time: 06/20/22  2:21 PM  Result Value Ref Range   Sodium 136 135 - 145 mmol/L   Potassium 3.4 (L) 3.5 - 5.1 mmol/L   Chloride 102 98 - 111 mmol/L   CO2 25 22 - 32 mmol/L   Glucose, Bld 98 70 - 99 mg/dL    Comment: Glucose reference range applies only to samples taken after fasting for at least 8 hours.   BUN 24 (H) 6 - 20 mg/dL   Creatinine, Ser 1.61 (H) 0.61 - 1.24 mg/dL   Calcium 9.1 8.9 - 10.3 mg/dL   Total Protein 6.8 6.5 - 8.1 g/dL   Albumin 4.5 3.5 - 5.0 g/dL   AST 107 (H) 15 - 41 U/L   ALT 58 (H) 0 - 44 U/L   Alkaline Phosphatase 38 38 - 126 U/L   Total Bilirubin 4.8 (H) 0.3 - 1.2 mg/dL   GFR, Estimated 51 (L) >60 mL/min    Comment: (NOTE) Calculated using the CKD-EPI Creatinine Equation (2021)    Anion gap 9 5 - 15    Comment: Performed at [facility], [place] [address], [place], [place] [address]  Ethanol     Status: None   Collection Time: 06/20/22  2:21 PM  Result Value Ref Range   Alcohol, Ethyl (B) <10 <10 mg/dL    Comment: (NOTE) Lowest detectable limit for serum alcohol is 10 mg/dL.  For medical purposes only. Performed at [facility], [place] [address], [place], [place] [address]   CBC with Diff     Status: Abnormal   Collection Time: 06/20/22  2:21 PM  Result Value Ref Range   WBC 7.8 4.0 - 10.5 K/uL   RBC 5.23 4.22 - 5.81 MIL/uL   Hemoglobin 12.7 (L) 13.0 - 17.0 g/dL   HCT 39.0 39.0 - 52.0 %   MCV 74.6 (L) 80.0 - 100.0 fL   MCH 24.3 (L) 26.0 - 34.0 pg   MCHC 32.6 30.0 - 36.0 g/dL   RDW 15.8 (H) 11.5 - 15.5 %    Platelets 233 150 - 400 K/uL   nRBC 0.0 0.0 - 0.2 %   Neutrophils Relative % 69 %   Neutro Abs 5.3 1.7 - 7.7 K/uL   Lymphocytes Relative 22 %   Lymphs Abs 1.7 0.7 - 4.0 K/uL   Monocytes Relative 9 %   Monocytes Absolute 0.7 0.1 - 1.0 K/uL   Eosinophils Relative 0 %   Eosinophils Absolute 0.0 0.0 - 0.5 K/uL   Basophils Relative 0 %   Basophils Absolute 0.0 0.0 - 0.1 K/uL   Immature Granulocytes 0 %   Abs Immature Granulocytes 0.03 0.00 - 0.07 K/uL    Comment: Performed at [facility], [address] [place], [place] [address]  Acetaminophen level     Status: Abnormal   Collection Time: 06/20/22  2:21 PM  Result Value Ref Range   Acetaminophen (Tylenol), Serum <10 (L) 10 - 30 ug/mL    Comment: (NOTE) Therapeutic concentrations vary significantly. A range of 10-30 ug/mL  may be an effective concentration for many patients. However, some  are best treated at concentrations outside of this range. Acetaminophen concentrations >150 ug/mL at 4 hours after ingestion  and >50 ug/mL at 12 hours after ingestion are often associated with  toxic reactions.  Performed at [facility], [place] [address], [place], [place] [id number]   Salicylate level     Status: Abnormal   Collection Time: 06/20/22  2:21 PM  Result Value Ref Range   Salicylate Lvl Q000111Q (L) 7.0 - 30.0 mg/dL    Comment: Performed at [facility] [address], [place], [place] [address]  Resp panel by RT-PCR (RSV, Flu A&B, Covid) Anterior Nasal Swab     Status: None   Collection Time: 06/20/22  2:53 PM   Specimen: Anterior Nasal Swab  Result Value Ref Range   SARS Coronavirus 2 by RT PCR NEGATIVE NEGATIVE   Influenza A by PCR NEGATIVE NEGATIVE   Influenza B by PCR NEGATIVE NEGATIVE    Comment: (NOTE) The Xpert Xpress SARS-CoV-2/FLU/RSV plus assay is intended as an aid in the diagnosis of influenza from Nasopharyngeal swab specimens and should not be used as a sole basis for treatment. Nasal washings  and aspirates are unacceptable for Xpert Xpress SARS-CoV-2/FLU/RSV testing.  Fact Sheet for Patients: EntrepreneurPulse.com.au  Fact Sheet for Healthcare Providers: IncredibleEmployment.be  This test is not yet approved or cleared by the [place] FDA and has been authorized for detection and/or diagnosis of SARS-CoV-2 by FDA under an Emergency Use Authorization (EUA). This EUA will remain in effect (meaning this test can be used) for the duration of the COVID-19 declaration under Section 564(b)(1) of the Act, 21 U.S.C. section 360bbb-3(b)(1), unless the authorization is terminated or revoked.     Resp Syncytial Virus by PCR NEGATIVE NEGATIVE    Comment: (NOTE) Fact Sheet for Patients: EntrepreneurPulse.com.au  Fact Sheet for Healthcare Providers: IncredibleEmployment.be  This test is not yet approved or cleared by the [place] FDA and has been authorized for detection and/or diagnosis  of SARS-CoV-2 by FDA under an Emergency Use Authorization (EUA). This EUA will remain in effect (meaning this test can be used) for the duration of the COVID-19 declaration under Section 564(b)(1) of the Act, 21 U.S.C. section 360bbb-3(b)(1), unless the authorization is terminated or revoked.  Performed at [facility], [place] [address] [place], [place] [address]     No current facility-administered medications for this encounter.   Current Outpatient Medications  Medication Sig Dispense Refill   amLODipine (NORVASC) 5 MG tablet Take 1 tablet (5 mg total) by mouth daily. For high blood pressure 90 tablet 3   Buprenorphine HCl-Naloxone HCl 12-3 MG FILM Place 12 mg under the tongue daily.  0   buPROPion (WELLBUTRIN XL) 300 MG 24 hr tablet Take 300 mg by mouth daily.     lisinopril (PRINIVIL,ZESTRIL) 10 MG tablet Take 1 tablet (10 mg total) by mouth daily. 90 tablet 3   mirtazapine (REMERON) 30 MG tablet TK  1 T PO HS     Psychiatric Specialty Exam: Presentation  General Appearance:  Appropriate for Environment  Eye Contact: Good  Speech: Clear and Coherent  Speech Volume: Normal  Handedness:No data recorded  Mood and Affect  Mood: Anxious; Depressed  Affect: Congruent   Thought Process  Thought Processes: Coherent  Descriptions of Associations:Intact  Orientation:Full (Time, Place and Person)  Thought Content:WDL  History of Schizophrenia/Schizoaffective disorder:No data recorded Duration of Psychotic Symptoms:No data recorded Hallucinations:Hallucinations: None  Ideas of Reference:No data recorded Suicidal Thoughts:Suicidal Thoughts: Yes, Passive SI Passive Intent and/or Plan: Without Intent; Without Plan  Homicidal Thoughts:Homicidal Thoughts: No   Sensorium  Memory: Immediate Fair; Recent Fair  Judgment: Fair  Insight: Fair   Executive Functions  Concentration: Good  Attention Span: Good  Recall: Good  Fund of Knowledge: Good  Language: Good   Psychomotor Activity  Psychomotor Activity: Psychomotor Activity: Normal   Assets  Assets: Desire for Improvement; Leisure Time; Physical Health; Resilience; Social Support    Sleep  Sleep: Sleep: Fair   Physical Exam: Physical Exam Neurological:     Mental Status: He is alert and oriented to person, place, and time.  Psychiatric:        Attention and Perception: Attention normal.        Mood and Affect: Mood is depressed.        Speech: Speech normal.        Behavior: Behavior is cooperative.        Thought Content: Thought content includes suicidal ideation.    Review of Systems  Musculoskeletal:  Positive for back pain.  Psychiatric/Behavioral:  Positive for depression, substance abuse and suicidal ideas. The patient has insomnia.   All other systems reviewed and are negative.  Blood pressure (!) 160/87, pulse 79, temperature 98.1 F (36.7 C), temperature source  Oral, resp. rate 16, SpO2 100 %. There is no height or weight on file to calculate BMI.  Medical Decision Making: Pt case reviewed and discussed with Dr. Dwyane Dee. Pt meets criteria for [place] [facility]C and inpatient psychiatric treatment. Pt is wiling to voluntarily go at this time. Pt currently being reviewed by [facility] will have CSW fax out if no availability.   - Restart Wellbutrin XL at 150 mg daily  Disposition: Recommend psychiatric Inpatient admission when medically cleared.  Vesta Mixer, NP 06/20/2022 5:41 PM

## 2022-06-20 NOTE — ED Provider Notes (Signed)
[facility] Provider Note   CSN: [id number] Arrival date & time: 06/20/22  1339     History  Chief Complaint  Patient presents with   Suicidal    Benjamin [name] is a 57 y.o. male with a past medical history significant for depression, hypertension, polysubstance abuse, and anxiety who presents to the ED due to suicidal ideations.  Patient notes he has thoughts of killing himself with a plan to cut himself.  Previous history of self harm. Patient notes he has attempted suicide in the past.  He notes he cut his left hand yesterday afternoon with a piece of glass.  Unsure when his last tetanus shot was.  Patient notes he left home on Saturday and has been "walking around".  Per [employer] at bedside, patient's girlfriend called police on [phone] due to patient being missing.  Patient notes he has not taken any of his home medications since leaving his house on Saturday. He notes he has not been on his Wellbutrin for numerous months. Takes Xanax periodically, took numerous when he started feeling depressed 5 days ago. Denies HI.  No auditory or visual hallucinations.  Admits to marijuana use and notes he possibly had a blunt with cocaine in it this weekend.  Admits to drinking a few 40 ounce beers over the weekend.  Denies daily alcohol use. No physical complaints.   History obtained from patient and past medical records. No interpreter used during encounter.       Home Medications Prior to Admission medications   Medication Sig Start Date End Date Taking? Authorizing Provider  amLODipine (NORVASC) 5 MG tablet Take 1 tablet (5 mg total) by mouth daily. For high blood pressure 03/04/18   Tysinger, Camelia Eng, PA-C  Buprenorphine HCl-Naloxone HCl 12-3 MG FILM Place 12 mg under the tongue daily. 02/13/18   [provider]  buPROPion (WELLBUTRIN XL) 300 MG 24 hr tablet Take 300 mg by mouth daily.    [provider]  lisinopril (PRINIVIL,ZESTRIL) 10  MG tablet Take 1 tablet (10 mg total) by mouth daily. 03/04/18   Tysinger, Camelia Eng, PA-C  mirtazapine (REMERON) 30 MG tablet TK 1 T PO HS 03/29/18   [provider]      Allergies    Patient has no known allergies.    Review of Systems   Review of Systems  Skin:  Positive for wound.  Psychiatric/Behavioral:  Positive for self-injury and suicidal ideas.     Physical Exam Updated Vital Signs BP (!) 140/84 (BP Location: Right Arm)   Pulse 83   Temp 98.7 F (37.1 C) (Oral)   Resp 17   SpO2 97%  Physical Exam Vitals and nursing note reviewed.  Constitutional:      General: He is not in acute distress.    Appearance: He is not ill-appearing.  HENT:     Head: Normocephalic.  Eyes:     Pupils: Pupils are equal, round, and reactive to light.  Cardiovascular:     Rate and Rhythm: Normal rate and regular rhythm.     Pulses: Normal pulses.     Heart sounds: Normal heart sounds. No murmur heard.    No friction rub. No gallop.  Pulmonary:     Effort: Pulmonary effort is normal.     Breath sounds: Normal breath sounds.  Abdominal:     General: Abdomen is flat. There is no distension.     Palpations: Abdomen is soft.  Tenderness: There is no abdominal tenderness. There is no guarding or rebound.  Musculoskeletal:        General: Normal range of motion.     Cervical back: Neck supple.     Comments: Full ROM of left thumb and all fingers. Radial pulse intact  Skin:    General: Skin is warm and dry.     Comments: 2 cm laceration to dorsum of left hand. Hemostasis achieved.  Neurological:     General: No focal deficit present.     Mental Status: He is alert.  Psychiatric:        Mood and Affect: Mood normal.        Behavior: Behavior normal.     ED Results / Procedures / Treatments   Labs (all labs ordered are listed, but only abnormal results are displayed) Labs Reviewed  COMPREHENSIVE METABOLIC PANEL - Abnormal; Notable for the following components:      Result  Value   Potassium 3.4 (*)    BUN 24 (*)    Creatinine, Ser 1.61 (*)    AST 107 (*)    ALT 58 (*)    Total Bilirubin 4.8 (*)    GFR, Estimated 51 (*)    All other components within normal limits  CBC WITH DIFFERENTIAL/PLATELET - Abnormal; Notable for the following components:   Hemoglobin 12.7 (*)    MCV 74.6 (*)    MCH 24.3 (*)    RDW 15.8 (*)    All other components within normal limits  ACETAMINOPHEN LEVEL - Abnormal; Notable for the following components:   Acetaminophen (Tylenol), Serum <10 (*)    All other components within normal limits  SALICYLATE LEVEL - Abnormal; Notable for the following components:   Salicylate Lvl [id number] (*)    All other components within normal limits  RESP PANEL BY RT-PCR (RSV, FLU A&B, COVID)  [id number]  ETHANOL  RAPID URINE DRUG SCREEN, HOSP PERFORMED    EKG EKG Interpretation  Date/Time:  Tuesday June 20 2022 15:28:49 EDT Ventricular Rate:  75 PR Interval:  170 QRS Duration: 104 QT Interval:  404 QTC Calculation: 451 R Axis:   66 Text Interpretation: Normal sinus rhythm with sinus arrhythmia Normal ECG When compared with ECG of 22-Nov-2017 16:46, PREVIOUS ECG IS PRESENT Confirmed by Octaviano Glow [phone]) on 06/20/2022 4:12:40 PM  Radiology US Abdomen Limited RUQ (LIVER/GB)  Result Date: 06/20/2022 CLINICAL DATA:  Elevated LFTs EXAM: ULTRASOUND ABDOMEN LIMITED RIGHT UPPER QUADRANT COMPARISON:  Right upper quadrant ultrasound 05/29/2022 FINDINGS: Gallbladder: Cholelithiasis is again seen with a gallstone measuring up to 1.2 cm. There is no gallbladder wall thickening or pericholecystic fluid. Sonographic Murphy's sign was reported negative. Common bile duct: Diameter: 3 mm Liver: Hepatic parenchymal echogenicity is mildly increased which may reflect mild fatty infiltration. There are no focal lesions. Portal vein is patent on color Doppler imaging with normal direction of blood flow towards the liver. Other: None. IMPRESSION: 1. Cholelithiasis  without evidence of acute cholecystitis. 2. Possible mild fatty infiltration of the liver. Electronically Signed   By: Valetta Mole M.D.   On: 06/20/2022 16:38   DG Hand Complete Left  Result Date: 06/20/2022 CLINICAL DATA:  Laceration EXAM: LEFT HAND - COMPLETE 3 VIEW COMPARISON:  None FINDINGS: No acute fracture or dislocation. Preserved joint spaces and bone mineralization. No definite radiopaque foreign body. Please correlate with the exact location of the soft tissue injury IMPRESSION: No acute osseous abnormality Electronically Signed   By: Larose Hires.D.  On: 06/20/2022 15:22    Procedures Procedures    Medications Ordered in ED Medications  amLODipine (NORVASC) tablet 5 mg (5 mg Oral Given 06/20/22 1450)  lisinopril (ZESTRIL) tablet 10 mg (10 mg Oral Given 06/20/22 1450)  Tdap (BOOSTRIX) injection 0.5 mL (0.5 mLs Intramuscular Given 06/20/22 1452)  sodium chloride 0.9 % bolus 1,000 mL (1,000 mLs Intravenous New Bag/Given 06/20/22 1649)    ED Course/ Medical Decision Making/ A&P                             Medical Decision Making Amount and/or Complexity of Data Reviewed External Data Reviewed: notes.    Details: Previous admission notes Labs: ordered. Decision-making details documented in ED Course. Radiology: ordered and independent interpretation performed. Decision-making details documented in ED Course. ECG/medicine tests: ordered and independent interpretation performed. Decision-making details documented in ED Course.  Risk Prescription drug management.   57 year old male presents to the ED due to suicidal ideations.  Patient notes he left home 3 days ago and has been filed as a missing person.  [employer] at bedside.  Patient has a history of depression and anxiety.  Was previously on Wellbutrin but notes he has not taken it in a few months.  Takes Xanax periodically which he has taken multiple since feeling depressed 5 days ago.  Patient has plans to cut himself.  History  of same.  Patient cut his left hand yesterday with a piece of glass.  Unsure when his last tetanus shot was.  No HI or auditory/visual hallucinations.  Upon arrival, stable vitals.  BP significantly elevated by EMS however, upon ED presentation patient's BP 140/84.  Patient notes he has not taken any of his medications in the past few days.  Will give his home dose of BP medications.  Patient is currently asymptomatic.  Low suspicion for hypertensive urgency/emergency. He admits to increased stress over the past few days. Medical clearance labs ordered. Laceration to left thumb. Full ROM of thumb. Low suspicion for tendon involvement. X-ray ordered to rule out foreign bodies vs underlying fracture.   Patient's last Tdap in 2017. Updated here in the ED. Wound thoroughly cleaned by RN. Will hold off on suture repair give wound has been opened for >12 hours.  CBC reassuring.  No leukocytosis.  Mild edema with hemoglobin at 12.7.  CMP significant for hypokalemia 3.4.  Elevated creatinine 1.61 and BUN at 24 which appears to be around patient's baseline.  Transaminitis.  Ordered right upper quadrant ultrasound to rule out evidence of acute cholecystitis however, my suspicion is lower given no tenderness on exam.  Ethanol, acetaminophen, and salicylate levels within normal limits.  COVID/influenza negative.  Left hand x-ray personally viewed interpreted which are negative for any foreign bodies or underlying bony fractures.  Right upper quadrant ultrasound demonstrates gallstones however, no evidence of acute cholecystitis.  Patient will need repeat LFTs in 1 week.  EKG demonstrates normal sinus rhythm.  No signs of acute ischemia.  5:01 PM reassessed patient at bedside.  Patient resting comfortably in bed.  Denies any physical complaints.  Patient has been medically cleared for TTS evaluation.  Patient is here voluntarily however, if he attempts to leave would IVC patient.  Has PCP Hx  anxiety/depression       Final Clinical Impression(s) / ED Diagnoses Final diagnoses:  Suicidal ideation  Laceration of left hand without foreign body, initial encounter    Rx / DC Orders ED  Discharge Orders     None         Karie Kirks 06/20/22 [place], Dante, DO 06/21/22 [phone]

## 2022-06-20 NOTE — Progress Notes (Signed)
Pt was accepted to [facility] 06/20/22; Bed Assignment 400-1 PENDING Labs, covid, voluntary consent faxed to [phone].  Pt meets inpatient criteria per Vesta Mixer, NP   Attending Physician will be Dr. Janine Limbo  Report can be called to: -Adult unit: [phone]  Pt can arrive after [facility] Team notified: [facility] Lynnda Shields, RN, Vesta Mixer, NP, Terri Piedra, RN  [place], [place] 06/20/2022 @ 6:50 PM

## 2022-06-20 NOTE — ED Notes (Signed)
Pt is voluntary for now consent form attached to the clipboard in orange zone 

## 2022-06-20 NOTE — ED Notes (Signed)
Pt dressed out in pruple scrubs. Pt belongings removed from pt area and placed in locker number 3 in the purple zone. Pt notified of locker number. Pt wanded  by security.

## 2022-06-20 NOTE — ED Triage Notes (Signed)
Pt BIB EMS after being found on side of road. Pt reports SI and leaving his home on Saturday, stating he wants to kill himself to his wife. Pt traveled from [place] area to [place].  EMS VS 220/130 HR 90 97 % RA [id number]

## 2022-06-21 ENCOUNTER — Encounter (HOSPITAL_COMMUNITY): Payer: Self-pay | Admitting: Nurse Practitioner

## 2022-06-21 ENCOUNTER — Encounter (HOSPITAL_COMMUNITY): Payer: Self-pay

## 2022-06-21 ENCOUNTER — Inpatient Hospital Stay (HOSPITAL_COMMUNITY)
Admission: AD | Admit: 2022-06-21 | Discharge: 2022-06-29 | DRG: 885 | Disposition: A | Discharge status: Home / Self Care | Payer: Medicare Other | Source: Intra-hospital | Attending: Psychiatry | Admitting: Psychiatry

## 2022-06-21 DIAGNOSIS — F4310 Post-traumatic stress disorder, unspecified: Secondary | ICD-10-CM | POA: Diagnosis present

## 2022-06-21 DIAGNOSIS — Z560 Unemployment, unspecified: Secondary | ICD-10-CM | POA: Diagnosis not present

## 2022-06-21 DIAGNOSIS — Z87891 Personal history of nicotine dependence: Secondary | ICD-10-CM | POA: Diagnosis not present

## 2022-06-21 DIAGNOSIS — Z833 Family history of diabetes mellitus: Secondary | ICD-10-CM | POA: Diagnosis not present

## 2022-06-21 DIAGNOSIS — Z9152 Personal history of nonsuicidal self-harm: Secondary | ICD-10-CM

## 2022-06-21 DIAGNOSIS — F1410 Cocaine abuse, uncomplicated: Secondary | ICD-10-CM | POA: Diagnosis present

## 2022-06-21 DIAGNOSIS — Z9151 Personal history of suicidal behavior: Secondary | ICD-10-CM | POA: Diagnosis not present

## 2022-06-21 DIAGNOSIS — R45851 Suicidal ideations: Secondary | ICD-10-CM | POA: Diagnosis present

## 2022-06-21 DIAGNOSIS — I129 Hypertensive chronic kidney disease with stage 1 through stage 4 chronic kidney disease, or unspecified chronic kidney disease: Secondary | ICD-10-CM | POA: Diagnosis present

## 2022-06-21 DIAGNOSIS — Z20822 Contact with and (suspected) exposure to covid-19: Secondary | ICD-10-CM | POA: Diagnosis present

## 2022-06-21 DIAGNOSIS — Z5901 Sheltered homelessness: Secondary | ICD-10-CM

## 2022-06-21 DIAGNOSIS — F1510 Other stimulant abuse, uncomplicated: Secondary | ICD-10-CM | POA: Diagnosis present

## 2022-06-21 DIAGNOSIS — G894 Chronic pain syndrome: Secondary | ICD-10-CM | POA: Diagnosis present

## 2022-06-21 DIAGNOSIS — N183 Chronic kidney disease, stage 3 unspecified: Secondary | ICD-10-CM | POA: Diagnosis present

## 2022-06-21 DIAGNOSIS — Z23 Encounter for immunization: Secondary | ICD-10-CM | POA: Diagnosis present

## 2022-06-21 DIAGNOSIS — F332 Major depressive disorder, recurrent severe without psychotic features: Secondary | ICD-10-CM | POA: Diagnosis present

## 2022-06-21 DIAGNOSIS — I10 Essential (primary) hypertension: Secondary | ICD-10-CM | POA: Diagnosis present

## 2022-06-21 DIAGNOSIS — F411 Generalized anxiety disorder: Secondary | ICD-10-CM | POA: Diagnosis present

## 2022-06-21 DIAGNOSIS — E785 Hyperlipidemia, unspecified: Secondary | ICD-10-CM | POA: Diagnosis present

## 2022-06-21 DIAGNOSIS — G4700 Insomnia, unspecified: Secondary | ICD-10-CM | POA: Diagnosis present

## 2022-06-21 DIAGNOSIS — Z818 Family history of other mental and behavioral disorders: Secondary | ICD-10-CM

## 2022-06-21 DIAGNOSIS — F329 Major depressive disorder, single episode, unspecified: Secondary | ICD-10-CM | POA: Diagnosis present

## 2022-06-21 MED ORDER — TRAZODONE HCL 50 MG PO TABS
50.0000 mg | ORAL_TABLET | Freq: Every evening | ORAL | Status: DC | PRN
  Administered 2022-06-21: 50 mg via ORAL
  Filled 2022-06-21: qty 1

## 2022-06-21 MED ORDER — ALUM & MAG HYDROXIDE-SIMETH 200-200-20 MG/5ML PO SUSP: 30.0000 mL | ORAL | Status: DC | PRN

## 2022-06-21 MED ORDER — BACITRACIN ZINC 500 UNIT/GM EX OINT
TOPICAL_OINTMENT | Freq: Two times a day (BID) | CUTANEOUS | Status: DC
  Filled 2022-06-21 (×15): qty 0.9

## 2022-06-21 MED ORDER — ACETAMINOPHEN 325 MG PO TABS
650.0000 mg | ORAL_TABLET | Freq: Four times a day (QID) | ORAL | Status: DC | PRN
  Administered 2022-06-21: 650 mg via ORAL
  Filled 2022-06-21: qty 2

## 2022-06-21 MED ORDER — IRBESARTAN 150 MG PO TABS
150.0000 mg | ORAL_TABLET | Freq: Every day | ORAL | Status: DC
  Administered 2022-06-21: 150 mg via ORAL
  Filled 2022-06-21 (×3): qty 1

## 2022-06-21 MED ORDER — VITAMIN B-1 100 MG PO TABS
100.0000 mg | ORAL_TABLET | Freq: Every day | ORAL | Status: DC
  Administered 2022-06-22 – 2022-06-29 (×8): 100 mg via ORAL
  Filled 2022-06-21 (×10): qty 1

## 2022-06-21 MED ORDER — LORAZEPAM 2 MG/ML IJ SOLN: 2.0000 mg | Freq: Three times a day (TID) | INTRAMUSCULAR | Status: DC | PRN

## 2022-06-21 MED ORDER — LOPERAMIDE HCL 2 MG PO CAPS: 2.0000 mg | ORAL_CAPSULE | ORAL | Status: AC | PRN

## 2022-06-21 MED ORDER — HYDROCODONE-ACETAMINOPHEN 10-325 MG PO TABS
1.0000 | ORAL_TABLET | Freq: Three times a day (TID) | ORAL | Status: DC | PRN
  Administered 2022-06-21 – 2022-06-22 (×3): 1 via ORAL
  Filled 2022-06-21 (×3): qty 1

## 2022-06-21 MED ORDER — AMLODIPINE BESYLATE 5 MG PO TABS
5.0000 mg | ORAL_TABLET | Freq: Once | ORAL | Status: AC
  Administered 2022-06-21: 5 mg via ORAL
  Filled 2022-06-21: qty 1

## 2022-06-21 MED ORDER — BUPROPION HCL ER (XL) 150 MG PO TB24
150.0000 mg | ORAL_TABLET | Freq: Every day | ORAL | Status: DC
  Administered 2022-06-21 – 2022-06-24 (×4): 150 mg via ORAL
  Filled 2022-06-21 (×6): qty 1

## 2022-06-21 MED ORDER — IRBESARTAN 150 MG PO TABS
150.0000 mg | ORAL_TABLET | Freq: Every day | ORAL | Status: DC
  Administered 2022-06-22 – 2022-06-24 (×3): 150 mg via ORAL
  Filled 2022-06-21 (×3): qty 1
  Filled 2022-06-21: qty 2
  Filled 2022-06-21 (×2): qty 1

## 2022-06-21 MED ORDER — AMLODIPINE BESYLATE 5 MG PO TABS
5.0000 mg | ORAL_TABLET | Freq: Every day | ORAL | Status: DC
  Administered 2022-06-22 – 2022-06-24 (×3): 5 mg via ORAL
  Filled 2022-06-21 (×5): qty 1

## 2022-06-21 MED ORDER — ROSUVASTATIN CALCIUM 10 MG PO TABS
10.0000 mg | ORAL_TABLET | Freq: Every day | ORAL | Status: DC
  Administered 2022-06-21 – 2022-06-29 (×9): 10 mg via ORAL
  Filled 2022-06-21 (×11): qty 1

## 2022-06-21 MED ORDER — LORAZEPAM 1 MG PO TABS: 1.0000 mg | ORAL_TABLET | Freq: Four times a day (QID) | ORAL | Status: AC | PRN

## 2022-06-21 MED ORDER — ADULT MULTIVITAMIN W/MINERALS CH
1.0000 | ORAL_TABLET | Freq: Every day | ORAL | Status: DC
  Administered 2022-06-21 – 2022-06-29 (×9): 1 via ORAL
  Filled 2022-06-21 (×11): qty 1

## 2022-06-21 MED ORDER — AMLODIPINE BESYLATE-VALSARTAN 5-160 MG PO TABS: 1.0000 | ORAL_TABLET | Freq: Every day | ORAL | Status: DC

## 2022-06-21 MED ORDER — HALOPERIDOL 5 MG PO TABS: 5.0000 mg | ORAL_TABLET | Freq: Three times a day (TID) | ORAL | Status: DC | PRN

## 2022-06-21 MED ORDER — BUPROPION HCL ER (XL) 150 MG PO TB24: 150.0000 mg | ORAL_TABLET | Freq: Every day | ORAL | Status: DC

## 2022-06-21 MED ORDER — ONDANSETRON 4 MG PO TBDP: 4.0000 mg | ORAL_TABLET | Freq: Four times a day (QID) | ORAL | Status: AC | PRN

## 2022-06-21 MED ORDER — MELATONIN 3 MG PO TABS
3.0000 mg | ORAL_TABLET | Freq: Every day | ORAL | Status: DC
  Administered 2022-06-21 – 2022-06-25 (×5): 3 mg via ORAL
  Filled 2022-06-21 (×9): qty 1

## 2022-06-21 MED ORDER — THIAMINE HCL 100 MG/ML IJ SOLN
100.0000 mg | Freq: Once | INTRAMUSCULAR | Status: DC
  Filled 2022-06-21: qty 2

## 2022-06-21 MED ORDER — BACITRACIN-NEOMYCIN-POLYMYXIN OINTMENT TUBE
TOPICAL_OINTMENT | Freq: Two times a day (BID) | CUTANEOUS | Status: DC
  Filled 2022-06-21: qty 14.17

## 2022-06-21 MED ORDER — LORAZEPAM 1 MG PO TABS: 2.0000 mg | ORAL_TABLET | Freq: Three times a day (TID) | ORAL | Status: DC | PRN

## 2022-06-21 MED ORDER — MAGNESIUM HYDROXIDE 400 MG/5ML PO SUSP: 30.0000 mL | Freq: Every day | ORAL | Status: DC | PRN

## 2022-06-21 MED ORDER — HYDROXYZINE HCL 25 MG PO TABS
25.0000 mg | ORAL_TABLET | Freq: Three times a day (TID) | ORAL | Status: DC | PRN
  Administered 2022-06-21 – 2022-06-22 (×4): 25 mg via ORAL
  Filled 2022-06-21 (×4): qty 1

## 2022-06-21 MED ORDER — HALOPERIDOL LACTATE 5 MG/ML IJ SOLN: 5.0000 mg | Freq: Three times a day (TID) | INTRAMUSCULAR | Status: DC | PRN

## 2022-06-21 MED ORDER — BACITRACIN ZINC 500 UNIT/GM EX OINT
TOPICAL_OINTMENT | Freq: Two times a day (BID) | CUTANEOUS | Status: DC
  Administered 2022-06-22 – 2022-06-25 (×6): 31.5556 via TOPICAL

## 2022-06-21 NOTE — H&P (Cosign Needed Addendum)
Psychiatric Admission Assessment Adult  Patient Identification: Benjamin [name] MRN:  [id number] Date of Evaluation:  06/21/2022 Chief Complaint:  MDD (major depressive disorder) [F32.9] Principal Diagnosis: MDD (major depressive disorder), recurrent severe, without psychosis ([place]) Diagnosis:  Principal Problem:   MDD (major depressive disorder), recurrent severe, without psychosis ([place]) Active Problems:   Essential hypertension, benign   Insomnia   Cocaine use disorder ([place])   Chronic pain syndrome   GAD (generalized anxiety disorder)  CC:" Last Saturday, I blacked out. I did not know who I was. I wasn't me, I just left the house and I walked and walked, and got on a bus, got off the bus, got on another bus, got off again, met some guys, bought some beer, bought some cigarettes, some drugs, and used them. I was on a suicide mission. I found some glass and cut myself before I called my wife."  Reason for Admission: Benjamin [name] is a 57 yo [place] American male with a history of MDD and polysubstance use who was taken to the [facility] ED by EMS after being found by a roadside, verbalizing suicidal ideations after making a deep cut to his left thumb area. Pt was medically treated and cleared prior to being transferred voluntarily to this [facility] for treatment and stabilization of his mental status.   Mode of transport to Hospital:Safe Transport Current Outpatient (Home) Medication List: Hydrocodone, Xanax, Wellbutrin. PRN medication prior to evaluation: Milk of mag, Tylenol, Maalox, trazodone.  ED course: Uneventful medically treated, administered a tetanus shot prior to being transferred voluntarily Collateral Information: None at this time POA/Legal Guardian: Patient is his own guardian  History of Present Illness: Patient reports that "Last Saturday, I blacked out.", but then goes on to recount the series of events that happened during the time of his black out, as quoted above under  "Chief compliant", and states that he remembers the details.  He states that Xanax which was a new prescription which he had taken for a month prior to the events above, triggered the episode that he had leading up to this hospitalization.  He admits to feeling suicidal during that time, states that he was on "a suicidal mission", reports that he made a cut to his left hand with a piece of glass that he found, and is observed to have a deep cut to his left thumb area.  Orders placed for bacitracin ointment, and for nursing to cleanse the area with normal saline solution, apply bacitracin ointment to area, prior to covering with a nonadherent dressing or Band-Aid. He states that a tetanus vaccine was administered to him in the ER.  Patient reports worsening intrusive thoughts of death for at least 2 days prior to leaving his home and walking on foot leading to the series of events which he recounts that happened prior to him being found by the roadside. He states that he did not know where he was going, states that he met random people with whom he purchased and used drugs during that time; He states that in the past week, he used "every type of drug". Toxicology + Cocaine, +Benzos.   Pt reports stressors stressors; states that his depressive symptoms started 5 yrs ago after his mother passed away, states that he attempted suicide at the time via cutting his right forearm, states that he was found at a hotel at the time bleeding, was medically treated prior to being transferred to [facility]; large healed surgical scar visible to right forearm. Pt  admits to being on a suicide mission in the past week, but denies depressive symptoms of anhedonia, decreased energy levels, trouble concentrating, denies feelings of hopelessness, helplessness or worthlessness, denies access to firearms, denies mania in the past or most recently, denies any psychosis in the past or recently, reports worsening anxiety, denies panic type  symptoms, denies emotional, sexual or physical abuse in the past or most recently.  Denies PTSD/OCD type symptoms in the past or recently.  Current Presentation: Pt presents with flat affect and an anxious & depressed mood, attention to personal hygiene and grooming is fair, eye contact is good, speech is clear & coherent. Thought contents are organized and logical, and pt currently endorses +SI, verbally contracts for safety on the unit, denies HI/AVH or paranoia. There is no evidence of delusional thoughts.    Past Psychiatric Hx: Previous Psych Diagnoses: MDD, polysubstance abuse Prior inpatient treatment: [facility] admissions on 08/13/15, 08/24/15, 11/13/17 & 11/26/17. Current/prior outpatient treatment: "Dr Sherrie Mustache" Prior rehab [id number]  Psychotherapy [id number] having one currently  History of suicide attempts: Multiple via cutting since childhood as per patient and via sitting on railroad tracks while living in [place]  History of homicide or aggression: Denies  Psychiatric medication history: Wellbutrin x 5 years-States it is effective, stopped "a couple of months ago" because he was feeling good and did not think that he needed it.  Psychiatric medication compliance history: not compliant for the most part Neuromodulation history: None  Current Psychiatrist: "Dr Sherrie Mustache" Current therapist: Denies   Substance Abuse Hx: Alcohol: Denies use Tobacco:Denies use Illicit drugs: States that he only used drugs "when I am not myself, and when I am on a suicide mission". As per chart review, pt has a substantial and extensive history of history use. Has a history of going on drug binges.  Rx drug abuse:Denies  Rehab [id number]   Past Medical History: Medical Diagnoses:  Chronic back pain      [facility]   CKD (chronic kidney disease), stage III ([place]) 10/2017   Erectile dysfunction     History of substance abuse     Hx of suicide attempt     Hypertension    Home [id number]   Prior Hosp:Denies  Prior Surgeries/Trauma:Patient reports that he used to play HS football and has sustained multiple injuries including a L hip injury for which he now requires a total replacement. He reports a chronic back injury which he states is "a crushed spine", and states he has a R shoulder injury and needs a R shoulder repair, and that he had a sciatica nerve injury and a rotator cuff injury. He states that he uses a cane and uses a back brace for ambulation Head trauma, LOC, concussions, seizures: PT consult places, and pt provided with a walker and educated on how to use it.  Allergies:Denies  LMP:n/a Contraception:Denies  [id number] that his Orthopaedic Provider is Dr Nelva Bush and he prescribes Hydrocodone for the pain in the various parts of his body.  Family History: Medical:Family History:       Family History  Problem Relation Age of Onset   Cancer Mother          lung   Anxiety disorder Mother     Cancer Father          throat   Diabetes Paternal Grandmother     Cancer Paternal Aunt     Heart disease Neg Hx     Stroke Neg Hx  Psych: Mother with MDD, attempted suicide multiple times, didn't succeed, but, but died of cancer. Psych [id number] SA/HA:mother, but didn't complete Substance use family [id number]   Social History:Reports a HS education, states that he lives with his wife who is supportive of him. He reports that he has 3 dogs, but no children.  Total Time spent with patient: 1.5 hours  Is the patient at risk to self? Yes.    Has the patient been a risk to self in the past 6 months? Yes.    Has the patient been a risk to self within the distant past? Yes.    Is the patient a risk to others? No.  Has the patient been a risk to others in the past 6 months? No.  Has the patient been a risk to others within the distant past? No.   [place] Scale:  [place] Admission (Current) from 06/21/2022 in [place] [facility] ED from  06/20/2022 in [facility] Emergency Department at [facility] ED from 11/22/2017 in [facility] Emergency Department at [place] CATEGORY High Risk High Risk High Risk      Alcohol Screening: 1. How often do you have a drink containing alcohol?: Never 2. How many drinks containing alcohol do you have on a typical day when you are drinking?: 1 or 2 3. How often do you have six or more drinks on one occasion?: Never AUDIT-C Score: 0 4. How often during the last year have you found that you were not able to stop drinking once you had started?: Never 5. How often during the last year have you failed to do what was normally expected from you because of drinking?: Never 6. How often during the last year have you needed a first drink in the morning to get yourself going after a heavy drinking session?: Never 7. How often during the last year have you had a feeling of guilt of remorse after drinking?: Never 8. How often during the last year have you been unable to remember what happened the night before because you had been drinking?: Never 9. Have you or someone else been injured as a result of your drinking?: No 10. Has a relative or friend or a doctor or another health worker been concerned about your drinking or suggested you cut down?: No Alcohol Use Disorder Identification Test Final Score (AUDIT): 0 Alcohol Brief Interventions/Follow-up: Alcohol education/Brief advice Substance Abuse History in the last 12 months:  Yes.   Consequences of Substance Abuse: Medical Consequences:  worsening of mental health symptoms  Previous Psychotropic Medications: Yes  Psychological Evaluations: No  Past Medical History:  Past Medical History:  Diagnosis Date   Chronic back pain    [facility]   CKD (chronic kidney disease), stage III ([place]) 10/2017   Erectile dysfunction    History of substance abuse ([place])    Hx of suicide attempt    Hypertension      Past Surgical History:  Procedure Laterality Date   ANKLE SURGERY     right   APPLICATION OF WOUND VAC Right 08/17/2015   Procedure: APPLICATION OF WOUND VAC;  Surgeon: Roseanne Kaufman, MD;  Location: [place];  Service: Orthopedics;  Laterality: Right;   HAND SURGERY     fracture, ORIG, teenage years, right.   I & D EXTREMITY Right 08/10/2015   Procedure: IRRIGATION AND DEBRIDEMENT SKIN, SUBCUTANEOS TISSUE AND  MUSCLE, CARPAL TUNNEL RELEASE, MEDIAN NERVE  LYSIS WITH NERVE WRAP;  Surgeon: Roseanne Kaufman, MD;  Location: [place];  Service: Orthopedics;  Laterality: Right;   INCISION AND DRAINAGE OF WOUND Right 08/15/2015   Procedure: IRRIGATION AND DEBRIDEMENT WOUND;  Surgeon: Justice Britain, MD;  Location: [facility];  Service: Orthopedics;  Laterality: Right;   LUMBAR EPIDURAL INJECTION     [facility]   SKIN SPLIT GRAFT Right 08/17/2015   Procedure: I&D RIGHT FOREARM/POSSIBLE SKIN GRAFT;  Surgeon: Roseanne Kaufman, MD;  Location: [place];  Service: Orthopedics;  Laterality: Right;   TENDON REPAIR N/A 07/24/2015   Procedure: WRIST LACERATION AND TENDON REPAIR;  Surgeon: Iran Planas, MD;  Location: [place];  Service: Orthopedics;  Laterality: N/A;   WOUND EXPLORATION Right 08/15/2015   Procedure: WOUND EXPLORATION right forearm;  Surgeon: Justice Britain, MD;  Location: [facility];  Service: Orthopedics;  Laterality: Right;   Family History:  Family History  Problem Relation Age of Onset   Cancer Mother        lung, smoker   Anxiety disorder Mother    Cancer Father        throat   Diabetes Paternal Grandmother    Cancer Paternal Aunt    Colon cancer Paternal Aunt    Heart disease Neg Hx    Stroke Neg Hx    Esophageal cancer Neg Hx    Inflammatory bowel disease Neg Hx    Liver disease Neg Hx    Pancreatic cancer Neg Hx    Rectal cancer Neg Hx    Stomach cancer Neg Hx    Family Psychiatric  History: See above Tobacco Screening:  Social History   Tobacco Use  Smoking Status Former    Packs/day: 0.50   Years: 6.00   Additional pack years: 0.00   Total pack years: 3.00   Types: Cigarettes  Smokeless Tobacco Never    BH Tobacco Counseling     Are you interested in Tobacco Cessation Medications?  No value filed. Counseled patient on smoking cessation:  N/A, patient does not use tobacco products Reason Tobacco Screening Not Completed: No value filed.       Social History:  Social History   Substance and Sexual Activity  Alcohol Use Yes   Alcohol/week: 0.0 standard drinks of alcohol   Comment: occasional     Social History   Substance and Sexual Activity  Drug Use Yes   Types: Marijuana, Cocaine, Heroin   Comment: last use cocaine 08/08/15, last use MJ 08/08/15     Allergies:  No Known Allergies Lab Results:  Results for orders placed or performed during the hospital encounter of 06/20/22 (from the past 48 hour(s))  Comprehensive metabolic panel     Status: Abnormal   Collection Time: 06/20/22  2:21 PM  Result Value Ref Range   Sodium 136 135 - 145 mmol/L   Potassium 3.4 (L) 3.5 - 5.1 mmol/L   Chloride 102 98 - 111 mmol/L   CO2 25 22 - 32 mmol/L   Glucose, Bld 98 70 - 99 mg/dL    Comment: Glucose reference range applies only to samples taken after fasting for at least 8 hours.   BUN 24 (H) 6 - 20 mg/dL   Creatinine, Ser 1.61 (H) 0.61 - 1.24 mg/dL   Calcium 9.1 8.9 - 10.3 mg/dL   Total Protein 6.8 6.5 - 8.1 g/dL   Albumin 4.5 3.5 - 5.0 g/dL   AST 107 (H) 15 - 41 U/L   ALT 58 (H) 0 -  44 U/L   Alkaline Phosphatase 38 38 - 126 U/L   Total Bilirubin 4.8 (H) 0.3 - 1.2 mg/dL   GFR, Estimated 51 (L) >60 mL/min    Comment: (NOTE) Calculated using the CKD-EPI Creatinine Equation (2021)    Anion gap 9 5 - 15    Comment: Performed at [facility] [address], [place], [place] [address]  Ethanol     Status: None   Collection Time: 06/20/22  2:21 PM  Result Value Ref Range   Alcohol, Ethyl (B) <10 <10 mg/dL    Comment: (NOTE) Lowest  detectable limit for serum alcohol is 10 mg/dL.  For medical purposes only. Performed at [facility], [place] [address], [place], [place] [address]   CBC with Diff     Status: Abnormal   Collection Time: 06/20/22  2:21 PM  Result Value Ref Range   WBC 7.8 4.0 - 10.5 K/uL   RBC 5.23 4.22 - 5.81 MIL/uL   Hemoglobin 12.7 (L) 13.0 - 17.0 g/dL   HCT 39.0 39.0 - 52.0 %   MCV 74.6 (L) 80.0 - 100.0 fL   MCH 24.3 (L) 26.0 - 34.0 pg   MCHC 32.6 30.0 - 36.0 g/dL   RDW 15.8 (H) 11.5 - 15.5 %   Platelets 233 150 - 400 K/uL   nRBC 0.0 0.0 - 0.2 %   Neutrophils Relative % 69 %   Neutro Abs 5.3 1.7 - 7.7 K/uL   Lymphocytes Relative 22 %   Lymphs Abs 1.7 0.7 - 4.0 K/uL   Monocytes Relative 9 %   Monocytes Absolute 0.7 0.1 - 1.0 K/uL   Eosinophils Relative 0 %   Eosinophils Absolute 0.0 0.0 - 0.5 K/uL   Basophils Relative 0 %   Basophils Absolute 0.0 0.0 - 0.1 K/uL   Immature Granulocytes 0 %   Abs Immature Granulocytes 0.03 0.00 - 0.07 K/uL    Comment: Performed at [facility], [address], [place], [place] [address]  Acetaminophen level     Status: Abnormal   Collection Time: 06/20/22  2:21 PM  Result Value Ref Range   Acetaminophen (Tylenol), Serum <10 (L) 10 - 30 ug/mL    Comment: (NOTE) Therapeutic concentrations vary significantly. A range of 10-30 ug/mL  may be an effective concentration for many patients. However, some  are best treated at concentrations outside of this range. Acetaminophen concentrations >150 ug/mL at 4 hours after ingestion  and >50 ug/mL at 12 hours after ingestion are often associated with  toxic reactions.  Performed at [facility], [place] [address] [place], [place] [id number]   Salicylate level     Status: Abnormal   Collection Time: 06/20/22  2:21 PM  Result Value Ref Range   Salicylate Lvl [id number] (L) 7.0 - 30.0 mg/dL    Comment: Performed at [facility] [address], [place], [place] [address]  Resp panel by RT-PCR (RSV,  Flu A&B, Covid) Anterior Nasal Swab     Status: None   Collection Time: 06/20/22  2:53 PM   Specimen: Anterior Nasal Swab  Result Value Ref Range   SARS Coronavirus 2 by RT PCR NEGATIVE NEGATIVE   Influenza A by PCR NEGATIVE NEGATIVE   Influenza B by PCR NEGATIVE NEGATIVE    Comment: (NOTE) The Xpert Xpress SARS-CoV-2/FLU/RSV plus assay is intended as an aid in the diagnosis of influenza from Nasopharyngeal swab specimens and should not be used as a sole  basis for treatment. Nasal washings and aspirates are unacceptable for Xpert Xpress SARS-CoV-2/FLU/RSV testing.  Fact Sheet for Patients: EntrepreneurPulse.com.au  Fact Sheet for Healthcare Providers: IncredibleEmployment.be  This test is not yet approved or cleared by the [place] FDA and has been authorized for detection and/or diagnosis of SARS-CoV-2 by FDA under an Emergency Use Authorization (EUA). This EUA will remain in effect (meaning this test can be used) for the duration of the COVID-19 declaration under Section 564(b)(1) of the Act, 21 U.S.C. section 360bbb-3(b)(1), unless the authorization is terminated or revoked.     Resp Syncytial Virus by PCR NEGATIVE NEGATIVE    Comment: (NOTE) Fact Sheet for Patients: EntrepreneurPulse.com.au  Fact Sheet for Healthcare Providers: IncredibleEmployment.be  This test is not yet approved or cleared by the [place] FDA and has been authorized for detection and/or diagnosis of SARS-CoV-2 by FDA under an Emergency Use Authorization (EUA). This EUA will remain in effect (meaning this test can be used) for the duration of the COVID-19 declaration under Section 564(b)(1) of the Act, 21 U.S.C. section 360bbb-3(b)(1), unless the authorization is terminated or revoked.  Performed at [facility], [place] [address] [place], [place] [address]   Urine rapid drug screen (hosp performed)     Status:  Abnormal   Collection Time: 06/20/22  5:47 PM  Result Value Ref Range   Opiates NONE DETECTED NONE DETECTED   Cocaine POSITIVE (A) NONE DETECTED   Benzodiazepines POSITIVE (A) NONE DETECTED   Amphetamines NONE DETECTED NONE DETECTED   Tetrahydrocannabinol NONE DETECTED NONE DETECTED   Barbiturates NONE DETECTED NONE DETECTED    Comment: (NOTE) DRUG SCREEN FOR MEDICAL PURPOSES ONLY.  IF CONFIRMATION IS NEEDED FOR ANY PURPOSE, NOTIFY LAB WITHIN 5 DAYS.  LOWEST DETECTABLE LIMITS FOR URINE DRUG SCREEN Drug Class                     Cutoff (ng/mL) Amphetamine and metabolites    1000 Barbiturate and metabolites    200 Benzodiazepine                 200 Opiates and metabolites        300 Cocaine and metabolites        300 THC                            50 Performed at [facility], [place] [address], [place], [place] [address]     Blood Alcohol level:  Lab Results  Component Value Date   [facility] <10 06/20/2022   ETH <10 [id number]    Metabolic Disorder Labs:  Lab Results  Component Value Date   HGBA1C 5.6 08/31/2016   MPG 114 08/31/2016   MPG 120 (H) 05/24/2015   No results found for: "PROLACTIN" Lab Results  Component Value Date   CHOL 249 (H) 03/04/2018   TRIG 137 03/04/2018   HDL 52 03/04/2018   CHOLHDL 4.8 03/04/2018   VLDL 33 (H) 08/31/2016   LDLCALC 170 (H) 03/04/2018   LDLCALC 159 (H) 10/08/2017    Current Medications: Current Facility-Administered Medications  Medication Dose Route Frequency Provider Last Rate Last Admin   acetaminophen (TYLENOL) tablet 650 mg  650 mg Oral Q6H PRN Vesta Mixer, NP   650 mg at 06/21/22 0254   alum & mag hydroxide-simeth (MAALOX/MYLANTA) 200-200-20 MG/5ML suspension 30 mL  30 mL Oral Q4H PRN Vesta Mixer, NP       [  START ON 06/22/2022] amLODipine (NORVASC) tablet 5 mg  5 mg Oral Daily Massengill, Ovid Curd, MD       And   Derrill Memo ON 06/22/2022] irbesartan (AVAPRO) tablet 150 mg  150 mg Oral Daily Massengill, Ovid Curd, MD        bacitracin ointment   Topical BID Janine Limbo, MD   Given at 06/21/22 1452   buPROPion (WELLBUTRIN XL) 24 hr tablet 150 mg  150 mg Oral Daily Vesta Mixer, NP   150 mg at 06/21/22 0900   haloperidol (HALDOL) tablet 5 mg  5 mg Oral TID PRN Vesta Mixer, NP       Or   haloperidol lactate (HALDOL) injection 5 mg  5 mg Intramuscular TID PRN Vesta Mixer, NP       HYDROcodone-acetaminophen (NORCO) 10-325 MG per tablet 1 tablet  1 tablet Oral Q8H PRN Nicholes Rough, NP   1 tablet at 06/21/22 2202   hydrOXYzine (ATARAX) tablet 25 mg  25 mg Oral TID PRN Vesta Mixer, NP   25 mg at 06/21/22 2158   irbesartan (AVAPRO) tablet 150 mg  150 mg Oral Daily Massengill, Ovid Curd, MD   150 mg at 06/21/22 1441   loperamide (IMODIUM) capsule 2-4 mg  2-4 mg Oral PRN Nicholes Rough, NP       LORazepam (ATIVAN) injection 2 mg  2 mg Intramuscular TID PRN Vesta Mixer, NP       LORazepam (ATIVAN) tablet 1 mg  1 mg Oral Q6H PRN Nicholes Rough, NP       magnesium hydroxide (MILK OF MAGNESIA) suspension 30 mL  30 mL Oral Daily PRN Vesta Mixer, NP       melatonin tablet 3 mg  3 mg Oral QHS Nicholes Rough, NP   3 mg at 06/21/22 2158   multivitamin with minerals tablet 1 tablet  1 tablet Oral Daily Nicholes Rough, NP   1 tablet at 06/21/22 1441   ondansetron (ZOFRAN-ODT) disintegrating tablet 4 mg  4 mg Oral Q6H PRN Nicholes Rough, NP       rosuvastatin (CRESTOR) tablet 10 mg  10 mg Oral Daily Nicholes Rough, NP   10 mg at 06/21/22 1442   [START ON 06/22/2022] thiamine (Vitamin B-1) tablet 100 mg  100 mg Oral Daily Denajah Farias, Tamela Oddi, NP       thiamine (VITAMIN B1) injection 100 mg  100 mg Intramuscular Once Nicholes Rough, NP       PTA Medications: Medications Prior to Admission  Medication Sig Dispense Refill Last Dose   ALPRAZolam (XANAX) 0.5 MG tablet Take 0.5 mg by mouth daily as needed.      amLODipine-valsartan (EXFORGE) 5-160 MG tablet Take 1 tablet by mouth daily.       HYDROcodone-acetaminophen (NORCO) 10-325 MG tablet Take 0.5-1 tablets by mouth every 8 (eight) hours as needed.      rosuvastatin (CRESTOR) 10 MG tablet Take 10 mg by mouth daily.      buPROPion (WELLBUTRIN XL) 300 MG 24 hr tablet Take 300 mg by mouth daily.      DULoxetine (CYMBALTA) 60 MG capsule Take 60 mg by mouth daily. (Patient not taking: Reported on 06/21/2022)   Not Taking  Musculoskeletal: Strength & Muscle Tone: within normal limits Gait & Station: normal Patient leans: N/A Psychiatric Specialty Exam:  Presentation  General Appearance:  Casual; Fairly Groomed  Eye Contact: Good  Speech: Clear and Coherent  Speech Volume: Normal  Handedness:Right   Mood and Affect  Mood: Depressed; Anxious  Affect: Congruent  Thought Process  Thought Processes: Coherent  Duration of Psychotic Symptoms:N/A Past Diagnosis of Schizophrenia or Psychoactive disorder: No data recorded Descriptions of Associations:Intact  Orientation:Full (Time, Place and Person)  Thought Content:Logical  Hallucinations:Hallucinations: None  Ideas of Reference:None  Suicidal Thoughts:Suicidal Thoughts: Yes, Active SI Active Intent and/or Plan: Without Intent; Without Plan SI Passive Intent and/or Plan: Without Intent; Without Plan  Homicidal Thoughts:Homicidal Thoughts: No   Sensorium  Memory: Immediate Good  Judgment: Poor  Insight: Poor   Executive Functions  Concentration: Fair  Attention Span: Fair  Recall: [employer] of Knowledge: Fair  Language: Fair   Psychomotor Activity  Psychomotor Activity: Psychomotor Activity: Decreased   Assets  Assets: Social Support   Sleep  Sleep: Sleep: Poor    Physical Exam: Physical Exam Constitutional:      Appearance: Normal appearance.  HENT:     Head: Normocephalic.     Nose: No congestion.  Musculoskeletal:     Cervical back: Normal range of motion.  Neurological:     General: No focal deficit  present.     Mental Status: He is alert and oriented to person, place, and time.    Review of Systems  Constitutional:  Negative for fever.  HENT:  Negative for hearing loss.   Eyes: Negative.   Respiratory:  Negative for cough.   Cardiovascular:  Negative for chest pain.  Gastrointestinal:  Negative for heartburn and nausea.  Genitourinary: Negative.   Musculoskeletal:  Positive for back pain, falls, joint pain and myalgias.  Skin: Negative.   Neurological:  Negative for dizziness.  Psychiatric/Behavioral:  Positive for depression, substance abuse and suicidal ideas. Negative for hallucinations. The patient is nervous/anxious and has insomnia.    Blood pressure (!) 149/92, pulse 68, temperature 98.3 F (36.8 C), temperature source Oral, resp. rate 18, height 6\' 2"  (1.88 m), weight 101.1 kg, SpO2 100 %. Body mass index is 28.61 kg/m.  Treatment Plan Summary: Daily contact with patient to assess and evaluate symptoms and progress in treatment and Medication management  Observation Level/Precautions:  15 minute checks  Laboratory:  Labs independently reviewed : Orders placed for lipid panel, HAIC, TSH, Vitamin D, repeat LFTs d/t elevated LFTs  Psychotherapy:  Unit Group sessions  Medications:  See [facility]  Consultations:  To be determined   Discharge Concerns:  Safety, medication compliance, mood stability  Estimated LOS: 5-7 days  Other:  N/A   Physician Treatment Plan for Primary Diagnosis: MDD (major depressive disorder), recurrent severe, without psychosis ([place])  PLAN Safety and Monitoring: Voluntary admission to inpatient psychiatric unit for safety, stabilization and treatment Daily contact with patient to assess and evaluate symptoms and progress in treatment Patient's case to be discussed in multi-disciplinary team meeting Observation Level : q15 minute checks Vital signs: q12 hours Precautions: Safety  Long Term Goal(s): Improvement in symptoms so as ready for  discharge  Short Term Goals: Ability to identify changes in lifestyle to reduce recurrence of condition will improve, Ability to disclose and discuss suicidal ideas, Ability to demonstrate self-control will improve, Ability to identify and develop effective coping behaviors will improve, Ability to maintain clinical measurements within normal limits will improve, Compliance with prescribed medications will improve, and Ability to identify triggers associated with substance abuse/mental health issues will improve  Diagnoses:  Principal Problem:   MDD (major depressive disorder), recurrent severe, without psychosis ([place]) Active Problems:   Essential hypertension, benign   Insomnia   Cocaine use disorder (HCC)   Chronic pain syndrome  GAD (generalized anxiety disorder)  Medications -Start Wellbutrin XR 150 mg daily for depressive symptoms (Home med) -Start Norco 1tab Q 8 H PRN for pain (home med) -Start Avapro 150 mg/Norvasc 5 mg  daily for htn (home med) -Start Hydroxyzine 25 mg TID PRN for anxiety -Start Melatonin 3 mg nightly for sleep -Start Crestor 10 mg daily for hyperlipidemia (home med) -Start Ativan 1mg  Q6H PRN for CIWA scores >10 -Start agitation protocol Zyprexa/Ativan/Geodon PRN  PRNS -Continue Tylenol 650 mg every 6 hours PRN for mild pain -Continue Maalox 30 mg every 4 hrs PRN for indigestion -Continue Imodium 2-4 mg as needed for diarrhea -Continue Milk of Magnesia as needed every 6 hrs for constipation -Continue Zofran disintegrating tabs every 6 hrs PRN for nausea   Discharge Planning: Social work and case management to assist with discharge planning and identification of hospital follow-up needs prior to discharge Estimated LOS: 5-7 days Discharge Concerns: Need to establish a safety plan; Medication compliance and effectiveness Discharge Goals: Return home with outpatient referrals for mental health follow-up including medication management/psychotherapy  I  certify that inpatient services furnished can reasonably be expected to improve the patient's condition.    Nicholes Rough, NP 3/27/202411:59 PM

## 2022-06-21 NOTE — Tx Team (Signed)
Initial Treatment Plan 06/21/2022 5:42 AM Benjamin [name] [id number]    PATIENT STRESSORS: Financial difficulties   Health problems   Medication change or noncompliance   Substance abuse     PATIENT STRENGTHS: Hydrographic surveyor for treatment/growth  Supportive family/friends    PATIENT IDENTIFIED PROBLEMS: Substance use  Suicide ideation  " How to stop suicidal thoughts"                 DISCHARGE CRITERIA:  Ability to meet basic life and health needs Improved stabilization in mood, thinking, and/or behavior Medical problems require only outpatient monitoring Verbal commitment to aftercare and medication compliance  PRELIMINARY DISCHARGE PLAN: Attend aftercare/continuing care group Attend PHP/IOP Attend 12-step recovery group Outpatient therapy Return to previous work or school arrangements  PATIENT/FAMILY INVOLVEMENT: This treatment plan has been presented to and reviewed with the patient, Benjamin [name], and/or family member.  The patient and family have been given the opportunity to ask questions and make suggestions.  Wolfgang Phoenix, RN 06/21/2022, 5:42 AM

## 2022-06-21 NOTE — Group Note (Signed)
Recreation Therapy Group Note   Group Topic:Team Building  Group Date: 06/21/2022 Start Time: 0930 End Time: 1000 Facilitators: Loribeth Katich-McCall, LRT,CTRS Location: [address] Dayroom   Goal Area(s) Addresses:  Patient will effectively work with peer towards shared goal.  Patient will identify skills used to make activity successful.  Patient will identify how skills used during activity can be applied to reach post d/c goals.   Group Description: [employer]. In teams of 5-6, patients were given 11 craft pipe cleaners. Using the materials provided, patients were instructed to compete again the opposing team(s) to build the tallest free-standing structure from floor level. The activity was timed; difficulty increased by Probation officer as Pharmacist, hospital continued.  Systematically resources were removed with additional directions for example, placing one arm behind their back, working in silence, and shape stipulations. LRT facilitated post-activity discussion reviewing team processes and necessary communication skills involved in completion. Patients were encouraged to reflect how the skills utilized, or not utilized, in this activity can be incorporated to positively impact support systems post discharge.   Affect/Mood: N/A   Participation Level: Did not attend    Clinical Observations/Individualized Feedback:      Plan: Continue to engage patient in RT group sessions 2-3x/week.   Trystin Terhune-McCall, LRT,CTRS 06/21/2022 12:00 PM

## 2022-06-21 NOTE — Progress Notes (Signed)
   06/21/22 1600  Psych Admission Type (Psych Patients Only)  Admission Status Voluntary  Psychosocial Assessment  Patient Complaints Anxiety;Depression  Eye Contact Brief  Facial Expression Anxious  Affect Depressed  Speech Logical/coherent  Interaction Assertive  Motor Activity Slow  Appearance/Hygiene In scrubs  Behavior Characteristics Cooperative  Mood Anxious  Aggressive Behavior  Targets Self  Effect Self-harm;Patient harmed  Thought Process  Coherency WDL  Content WDL  Delusions None reported or observed  Perception WDL  Hallucination None reported or observed  Judgment Poor  Confusion None  Danger to Self  Current suicidal ideation? Denies  Danger to Others  Danger to Others None reported or observed

## 2022-06-21 NOTE — BHH Group Notes (Signed)
[place] Group Notes:  (Nursing/MHT/Case Management/Adjunct)  Date:  06/21/2022  Time:  9:55 AM  Type of Therapy:  Group Therapy  Participation Level:  Did Not Attend  Summary of Progress/Problems:  Patient did not attend goals/ orientation group today.   Elza Rafter 06/21/2022, 9:55 AM

## 2022-06-21 NOTE — Progress Notes (Signed)
   06/21/22 0515  15 Minute Checks  Location Bedroom  Visual Appearance Calm  Behavior Sleeping  Sleep (Behavioral Health Patients Only)  Calculate sleep? (Click Yes once per 24 hr at 0600 safety check) Yes  Documented sleep last 24 hours 1.5

## 2022-06-21 NOTE — BHH Group Notes (Signed)
Pt attended N/A

## 2022-06-21 NOTE — Progress Notes (Signed)
RN dressed patient's L wrist wound.  Applied bacitracin and covered with dressing.    Pt shaved his head under direction of MHT present and pt's scalp was bleeding on the back of head.  Bandage applied.

## 2022-06-21 NOTE — Progress Notes (Signed)
Benjamin [name] is a 57 y.o. male voluntarily admitted for suicide ideation and depression. Pt stated he is been overwhelm with life, he stopped taking his prescribed medication and stared taking xanax which he think was a bad idea. Stated he left his house last Saturday and has bee walking around not knowing where he was going. Pt stated police found him and brought him to the ED. Pt is alert and orient at the time of admission. Pt reporting passive SI with a plan to slit his wrist but contracted for safety. Consents signed, skin/belongings search completed and pt oriented to unit. Pt stable at this time. Pt given the opportunity to express concerns and ask questions. Pt given toiletries. Will continue to monitor.

## 2022-06-21 NOTE — BH IP Treatment Plan (Signed)
Interdisciplinary Treatment and Diagnostic Plan Update  06/21/2022 Time of Session: 10:45 AM  Benjamin [name] MRN: [id number]  Principal Diagnosis: MDD (major depressive disorder)  Secondary Diagnoses: Principal Problem:   MDD (major depressive disorder)   Current Medications:  Current Facility-Administered Medications  Medication Dose Route Frequency Provider Last Rate Last Admin   acetaminophen (TYLENOL) tablet 650 mg  650 mg Oral Q6H PRN Vesta Mixer, NP   650 mg at 06/21/22 0254   alum & mag hydroxide-simeth (MAALOX/MYLANTA) 200-200-20 MG/5ML suspension 30 mL  30 mL Oral Q4H PRN Vesta Mixer, NP       buPROPion (WELLBUTRIN XL) 24 hr tablet 150 mg  150 mg Oral Daily Vesta Mixer, NP   150 mg at 06/21/22 0900   haloperidol (HALDOL) tablet 5 mg  5 mg Oral TID PRN Vesta Mixer, NP       Or   haloperidol lactate (HALDOL) injection 5 mg  5 mg Intramuscular TID PRN Vesta Mixer, NP       hydrOXYzine (ATARAX) tablet 25 mg  25 mg Oral TID PRN Vesta Mixer, NP   25 mg at 06/21/22 0254   LORazepam (ATIVAN) tablet 2 mg  2 mg Oral TID PRN Vesta Mixer, NP       Or   LORazepam (ATIVAN) injection 2 mg  2 mg Intramuscular TID PRN Vesta Mixer, NP       magnesium hydroxide (MILK OF MAGNESIA) suspension 30 mL  30 mL Oral Daily PRN Vesta Mixer, NP       traZODone (DESYREL) tablet 50 mg  50 mg Oral QHS PRN Vesta Mixer, NP   50 mg at 06/21/22 0254   PTA Medications: Medications Prior to Admission  Medication Sig Dispense Refill Last Dose   ALPRAZolam (XANAX) 0.5 MG tablet Take 0.5 mg by mouth daily as needed.      amLODipine-valsartan (EXFORGE) 5-160 MG tablet Take 1 tablet by mouth daily.      HYDROcodone-acetaminophen (NORCO) 10-325 MG tablet Take 0.5-1 tablets by mouth every 8 (eight) hours as needed.      rosuvastatin (CRESTOR) 10 MG tablet Take 10 mg by mouth daily.      buPROPion (WELLBUTRIN XL) 300 MG 24 hr tablet Take 300 mg by mouth daily.       DULoxetine (CYMBALTA) 60 MG capsule Take 60 mg by mouth daily. (Patient not taking: Reported on 06/21/2022)   Not Taking    Patient Stressors: Financial difficulties   Health problems   Medication change or noncompliance   Substance abuse    Patient Strengths: Hydrographic surveyor for treatment/growth  Supportive family/friends   Treatment Modalities: Medication Management, Group therapy, Case management,  1 to 1 session with clinician, Psychoeducation, Recreational therapy.   Physician Treatment Plan for Primary Diagnosis: MDD (major depressive disorder) Long Term Goal(s):     Short Term Goals:    Medication Management: Evaluate patient's response, side effects, and tolerance of medication regimen.  Therapeutic Interventions: 1 to 1 sessions, Unit Group sessions and Medication administration.  Evaluation of Outcomes: Not Progressing  Physician Treatment Plan for Secondary Diagnosis: Principal Problem:   MDD (major depressive disorder)  Long Term Goal(s):     Short Term Goals:       Medication Management: Evaluate patient's response, side effects, and tolerance of medication regimen.  Therapeutic Interventions: 1 to 1 sessions, Unit Group sessions and Medication administration.  Evaluation of Outcomes: Not Progressing   RN Treatment Plan for Primary Diagnosis: MDD (major depressive disorder) Long Term  Goal(s): Knowledge of disease and therapeutic regimen to maintain health will improve  Short Term Goals: Ability to remain free from injury will improve, Ability to verbalize frustration and anger appropriately will improve, Ability to demonstrate self-control, Ability to participate in decision making will improve, Ability to verbalize feelings will improve, Ability to disclose and discuss suicidal ideas, Ability to identify and develop effective coping behaviors will improve, and Compliance with prescribed medications will improve  Medication Management: RN  will administer medications as ordered by provider, will assess and evaluate patient's response and provide education to patient for prescribed medication. RN will report any adverse and/or side effects to prescribing provider.  Therapeutic Interventions: 1 on 1 counseling sessions, Psychoeducation, Medication administration, Evaluate responses to treatment, Monitor vital signs and CBGs as ordered, Perform/monitor CIWA, COWS, AIMS and Fall Risk screenings as ordered, Perform wound care treatments as ordered.  Evaluation of Outcomes: Not Progressing   LCSW Treatment Plan for Primary Diagnosis: MDD (major depressive disorder) Long Term Goal(s): Safe transition to appropriate next level of care at discharge, Engage patient in therapeutic group addressing interpersonal concerns.  Short Term Goals: Engage patient in aftercare planning with referrals and resources, Increase social support, Increase ability to appropriately verbalize feelings, Increase emotional regulation, Facilitate acceptance of mental health diagnosis and concerns, Facilitate patient progression through stages of change regarding substance use diagnoses and concerns, Identify triggers associated with mental health/substance abuse issues, and Increase skills for wellness and recovery  Therapeutic Interventions: Assess for all discharge needs, 1 to 1 time with Social worker, Explore available resources and support systems, Assess for adequacy in community support network, Educate family and significant other(s) on suicide prevention, Complete Psychosocial Assessment, Interpersonal group therapy.  Evaluation of Outcomes: Not Progressing   Progress in Treatment: Attending groups: No. Participating in groups: No. Taking medication as prescribed: Yes. Toleration medication: Yes. Family/Significant other contact made: No, will contact:  Whoever pt give CSW permission to contact Patient understands diagnosis: Yes. Discussing patient  identified problems/goals with staff: Yes. Medical problems stabilized or resolved: Yes. Denies suicidal/homicidal ideation: Yes. Issues/concerns per patient self-inventory: No.   New problem(s) identified: No, Describe:  None reported   New Short Term/Long Term Goal(s): medication stabilization, elimination of SI thoughts, development of comprehensive mental wellness plan.    Patient Goals:  " Feel strong enough and confident not to hurt myself "   Discharge Plan or Barriers: Patient recently admitted. CSW will continue to follow and assess for appropriate referrals and possible discharge planning.    Reason for Continuation of Hospitalization: Anxiety Depression Medication stabilization Suicidal ideation  Estimated Length of Stay: 3-5 days   Last 3 [place] Suicide Severity Risk Score: [place] Admission (Current) from 06/21/2022 in [place] [facility] ED from 06/20/2022 in [facility] Emergency Department at [facility] ED from 11/22/2017 in [facility] Emergency Department at [place] High Risk High Risk High Risk       Last [facility] 2/9 Scores:    10/08/2017    2:29 PM  Depression screen PHQ 2/9  Decreased Interest 2  Down, Depressed, Hopeless 1  PHQ - 2 Score 3  Altered sleeping 1  Tired, decreased energy 1  Change in appetite 0  Feeling bad or failure about yourself  0  Trouble concentrating 0  Moving slowly or fidgety/restless 0  Suicidal thoughts 0  PHQ-9 Score 5    Scribe for Treatment Team: Charlett Lango 06/21/2022 11:49 AM

## 2022-06-21 NOTE — Group Note (Signed)
Date:  06/21/2022 Time:  5:26 PM  Group Topic/Focus:  Spirituality:   The focus of this group is to discuss how one's spirituality can aide in recovery.    Participation Level:  Active  Participation Quality:  Appropriate  Affect:  Appropriate  Cognitive:  Appropriate  Insight: Appropriate  Engagement in Group:  Engaged  Modes of Intervention:  Discussion  Additional Comments:     [name] 06/21/2022, 5:26 PM

## 2022-06-22 DIAGNOSIS — F332 Major depressive disorder, recurrent severe without psychotic features: Secondary | ICD-10-CM | POA: Diagnosis not present

## 2022-06-22 MED ORDER — CLONIDINE HCL 0.1 MG PO TABS
0.1000 mg | ORAL_TABLET | ORAL | Status: AC
  Administered 2022-06-22: 0.1 mg via ORAL
  Filled 2022-06-22 (×2): qty 1

## 2022-06-22 MED ORDER — WHITE PETROLATUM EX OINT
TOPICAL_OINTMENT | CUTANEOUS | Status: AC
  Filled 2022-06-22: qty 5

## 2022-06-22 MED ORDER — HYDROCODONE-ACETAMINOPHEN 10-325 MG PO TABS
1.0000 | ORAL_TABLET | Freq: Three times a day (TID) | ORAL | Status: DC | PRN
  Administered 2022-06-22 – 2022-06-29 (×21): 1 via ORAL
  Filled 2022-06-22 (×21): qty 1

## 2022-06-22 NOTE — BHH Suicide Risk Assessment (Signed)
Suicide Risk Assessment  Admission Assessment    [facility] Admission Suicide Risk Assessment   Nursing information obtained from:  Patient Demographic factors:  Male, Low socioeconomic status, Unemployed Current Mental Status:  Suicidal ideation indicated by patient Loss Factors:  Decline in physical health, Financial problems / change in socioeconomic status Historical Factors:  Prior suicide attempts Risk Reduction Factors:  Positive social support, Positive therapeutic relationship, Living with another person, especially a relative  Total Time spent with patient: 1.5 hours Principal Problem: MDD (major depressive disorder), recurrent severe, without psychosis ([place]) Diagnosis:  Principal Problem:   MDD (major depressive disorder), recurrent severe, without psychosis ([place]) Active Problems:   Essential hypertension, benign   Insomnia   Cocaine use disorder ([place])   Chronic pain syndrome   GAD (generalized anxiety disorder)  Subjective Data: C:" Last Saturday, I blacked out. I did not know who I was. I wasn't me, I just left the house and I walked and walked, and got on a bus, got off the bus, got on another bus, got off again, met some guys, bought some beer, bought some cigarettes, some drugs, and used them. I was on a suicide mission. I found some glass and cut myself before I called my wife."   Continued Clinical Symptoms: Suicidal ideations and depressed mood in need of continuous hospitalization for treatment and stabilization of current mental status.  Alcohol Use Disorder Identification Test Final Score (AUDIT): 0 The "Alcohol Use Disorders Identification Test", Guidelines for Use in Primary Care, Second Edition.  World Pharmacologist [facility]). Score between 0-7:  no or low risk or alcohol related problems. Score between 8-15:  moderate risk of alcohol related problems. Score between 16-19:  high risk of alcohol related problems. Score 20 or above:  warrants further diagnostic  evaluation for alcohol dependence and treatment.   CLINICAL FACTORS:   Depression:   Comorbid alcohol abuse/dependence   Musculoskeletal: Strength & Muscle Tone: within normal limits Gait & Station: unsteady Patient leans: N/A  Psychiatric Specialty Exam:  Presentation  General Appearance:  Casual; Fairly Groomed  Eye Contact: Good  Speech: Clear and Coherent  Speech Volume: Normal  Handedness: Right   Mood and Affect  Mood: Depressed; Anxious  Affect: Congruent   Thought Process  Thought Processes: Coherent  Descriptions of Associations:Intact  Orientation:Full (Time, Place and Person)  Thought Content:Logical  History of Schizophrenia/Schizoaffective disorder:No data recorded Duration of Psychotic Symptoms:No data recorded Hallucinations:Hallucinations: None  Ideas of Reference:None  Suicidal Thoughts:Suicidal Thoughts: Yes, Active SI Active Intent and/or Plan: Without Intent; Without Plan  Homicidal Thoughts:Homicidal Thoughts: No   Sensorium  Memory: Immediate Good  Judgment: Poor  Insight: Poor   Executive Functions  Concentration: Fair  Attention Span: Fair  Recall: [employer] of Knowledge: Fair  Language: Fair   Psychomotor Activity  Psychomotor Activity: Psychomotor Activity: Decreased   Assets  Assets: Social Support   Sleep  Sleep: Sleep: Poor    Physical Exam: Physical Exam Review of Systems  Musculoskeletal:  Positive for back pain, joint pain and myalgias.  Psychiatric/Behavioral:  Positive for depression and suicidal ideas.    Blood pressure (!) 149/92, pulse 68, temperature 98.3 F (36.8 C), temperature source Oral, resp. rate 18, height 6\' 2"  (1.88 m), weight 101.1 kg, SpO2 100 %. Body mass index is 28.61 kg/m.   COGNITIVE FEATURES THAT CONTRIBUTE TO RISK:  None    SUICIDE RISK:   Moderate:  Frequent suicidal ideation with limited intensity, and duration, some specificity in terms  of plans, no associated intent, good self-control, limited dysphoria/symptomatology, some risk factors present, and identifiable protective factors, including available and accessible social support.  PLAN OF CARE: See H & P  I certify that inpatient services furnished can reasonably be expected to improve the patient's condition.   Nicholes Rough, NP 06/22/2022, 12:21 AM

## 2022-06-22 NOTE — Progress Notes (Addendum)
PT Cancellation Note  Patient Details Name: Benjamin [name] MRN: [id number] DOB: 1965/07/25   Cancelled Treatment:      PT order received. Our OT who covers [place] will see and assess pt. If any further PT needs, he will let us know. If he can address all issues then PT will sign off.   Addendum: Spoke with OT at 1544 and pt with no acute PT needs. Will sign off.  Thank you  Abran Richard, PT Acute Rehab [facility] [phone]  Karlton Lemon 06/22/2022, 1:17 PM

## 2022-06-22 NOTE — Plan of Care (Signed)
  Problem: Activity: Goal: Interest or engagement in leisure activities will improve Outcome: Progressing   Problem: Education: Goal: Knowledge of the prescribed therapeutic regimen will improve Outcome: Progressing

## 2022-06-22 NOTE — Progress Notes (Signed)
   06/22/22 2230  Vital Signs  Pulse Rate 67  Pulse Rate Source Monitor  BP 132/88  BP Method Automatic  Oxygen Therapy  SpO2 98 %    Blood pressure post Clonidine administration.  Provider notified.

## 2022-06-22 NOTE — Progress Notes (Addendum)
BP Rechecked    06/22/22 2031  Vital Signs  Pulse Rate 67  BP (!) 160/86  BP Location Right Arm  BP Method Automatic  Patient Position (if appropriate) Standing    Provider notified.  Please advise.  Administered "NOW" order of Clonidine per MAR per provider's order.

## 2022-06-22 NOTE — Progress Notes (Signed)
   06/22/22 1500  Psych Admission Type (Psych Patients Only)  Admission Status Voluntary  Psychosocial Assessment  Patient Complaints Anxiety;Depression  Eye Contact Fair  Facial Expression Anxious  Affect Depressed  Speech Logical/coherent  Interaction Assertive  Motor Activity Slow  Appearance/Hygiene Unremarkable  Behavior Characteristics Cooperative;Appropriate to situation  Mood Depressed;Anxious  Thought Process  Coherency WDL  Content Blaming self  Delusions None reported or observed  Perception WDL  Hallucination None reported or observed  Judgment Poor  Confusion None  Danger to Self  Current suicidal ideation? Passive  Agreement Not to Harm Self Yes  Description of Agreement verbal contract  Danger to Others  Danger to Others None reported or observed
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
## 2022-06-22 NOTE — Progress Notes (Signed)
   06/22/22 1950  Vital Signs  Temp 98.6 F (37 C)  Temp Source Oral  Pulse Rate 65  BP (!) 171/89  BP Method Automatic   Patient presents mildly anxious and rates pain 8/10. Administered PRN Hydrocodone and Hydroxyzine.   Provider notified.  Will recheck BP in 30 minutes.

## 2022-06-22 NOTE — Plan of Care (Signed)
  Problem: Activity: Goal: Interest or engagement in leisure activities will improve Outcome: Progressing   Problem: Education: Goal: Knowledge of the prescribed therapeutic regimen will improve Outcome: Progressing   Problem: Coping: Goal: Will verbalize feelings Outcome: Progressing

## 2022-06-22 NOTE — Progress Notes (Signed)
[facility] MD Progress Note  06/22/2022 10:56 AM Benjamin [name]  MRN:  [id number]  Principal Problem: MDD (major depressive disorder), recurrent severe, without psychosis ([place])  Diagnosis: Principal Problem:   MDD (major depressive disorder), recurrent severe, without psychosis ([place]) Active Problems:   Essential hypertension, benign   Insomnia   Cocaine use disorder ([place])   Chronic pain syndrome   GAD (generalized anxiety disorder)   Subjective: Benjamin [name] states," I feel like I am getting better and cannot recollect what happened to me, because my head was so clouded and could not figure out what was going on when I came in."                 Brief reason for admission: Patient reports that "Last Saturday, I blacked out.", but then goes on to recount the series of events that happened during the time of his black out, as quoted above under "Chief compliant", and states that he remembers the details.  He states that Xanax which was a new prescription which he had taken for a month prior to the events above, triggered the episode that he had leading up to this hospitalization.  He admits to feeling suicidal during that time, states that he was on "a suicidal mission", reports that he made a cut to his left hand with a piece of glass that he found, and is observed to have a deep cut to his left thumb area.  Orders placed for bacitracin ointment, and for nursing to cleanse the area with normal saline solution, apply bacitracin ointment to area, prior to covering with a nonadherent dressing or Band-Aid. He states that a tetanus vaccine was administered to him in the ER.   24-hour chart Review: Past 24 hours of patient's chart was reviewed.  Patient is compliant with scheduled meds. Required Agitation PRNs: none Per RN notes, no documented behavioral issues and is attending group. Patient slept, 7 hours.   As needed medications of Norco 10-325 mg p.o. given at 1212 today for back and hip pain, and  hydroxyzine 25 mg p.o. given at 2158 yesterday for anxiety.  Today's assessment: Patient seen and examined in his room sitting on a bench on [address].  He is alert, oriented to person, place, time, and situation.  Chart reviewed and findings shared with the treatment team and discussed with attending psychiatrist.  Patient reports his mood is getting better and overall he is thinking process is much more improved compared to when he was admitted.  Continues to ambulate with a walker without any incident.  Safety measures in place.  Report anxiety of #5 and depression of #10, with 10 being the worst.  Continues on scheduled medication without complaint of somatic discomfort.  He reports good appetite and sleeping over 7 hours last night.  Vital signs reviewed with blood pressure 143/100 and pulse 69.  Patient remains asymptomatic. He continues to endorse passive suicidal ideation without any plans, intent, or means to carry it out.  Denies HI and AVH.  No delusional thinking or paranoia observed during this examination.  Able to contract for safety during this admission.  Continue with current treatment plan as indicated below.  Total Time spent with patient: 30 minutes  Past Psychiatric History:  Previous Psych Diagnoses: MDD, polysubstance abuse Prior inpatient treatment: [facility] admissions on 08/13/15, 08/24/15, 11/13/17 & 11/26/17. Current/prior outpatient treatment: "Dr Sherrie Mustache" Prior rehab [id number]  Psychotherapy [id number] having one currently  History of suicide attempts: Multiple via cutting since childhood as  per patient and via sitting on railroad tracks while living in [place]    Past Medical History:  Past Medical History:  Diagnosis Date   Chronic back pain    managed by [facility]   CKD (chronic kidney disease), stage III ([place]) 10/2017   Erectile dysfunction    History of substance abuse ([place])    Hx of suicide attempt    Hypertension     Past Surgical History:  Procedure  Laterality Date   ANKLE SURGERY     right   APPLICATION OF WOUND VAC Right 08/17/2015   Procedure: APPLICATION OF WOUND VAC;  Surgeon: Roseanne Kaufman, MD;  Location: [place];  Service: Orthopedics;  Laterality: Right;   HAND SURGERY     fracture, ORIG, teenage years, right.   I & D EXTREMITY Right 08/10/2015   Procedure: IRRIGATION AND DEBRIDEMENT SKIN, SUBCUTANEOS TISSUE AND  MUSCLE, CARPAL TUNNEL RELEASE, MEDIAN NERVE LYSIS WITH NERVE WRAP;  Surgeon: Roseanne Kaufman, MD;  Location: [place];  Service: Orthopedics;  Laterality: Right;   INCISION AND DRAINAGE OF WOUND Right 08/15/2015   Procedure: IRRIGATION AND DEBRIDEMENT WOUND;  Surgeon: Justice Britain, MD;  Location: [facility];  Service: Orthopedics;  Laterality: Right;   LUMBAR EPIDURAL INJECTION     [facility]   SKIN SPLIT GRAFT Right 08/17/2015   Procedure: I&D RIGHT FOREARM/POSSIBLE SKIN GRAFT;  Surgeon: Roseanne Kaufman, MD;  Location: [place];  Service: Orthopedics;  Laterality: Right;   TENDON REPAIR N/A 07/24/2015   Procedure: WRIST LACERATION AND TENDON REPAIR;  Surgeon: Iran Planas, MD;  Location: [place];  Service: Orthopedics;  Laterality: N/A;   WOUND EXPLORATION Right 08/15/2015   Procedure: WOUND EXPLORATION right forearm;  Surgeon: Justice Britain, MD;  Location: [facility];  Service: Orthopedics;  Laterality: Right;   Family History:  Family History  Problem Relation Age of Onset   Cancer Mother        lung, smoker   Anxiety disorder Mother    Cancer Father        throat   Diabetes Paternal Grandmother    Cancer Paternal Aunt    Colon cancer Paternal Aunt    Heart disease Neg Hx    Stroke Neg Hx    Esophageal cancer Neg Hx    Inflammatory bowel disease Neg Hx    Liver disease Neg Hx    Pancreatic cancer Neg Hx    Rectal cancer Neg Hx    Stomach cancer Neg Hx    Family Psychiatric  History:  Mother with MDD, attempted suicide multiple times, didn't succeed, but, but died of cancer. Psych [id number] SA/HA:mother, but  didn't complete Substance use family [id number]   Social History:  Social History   Substance and Sexual Activity  Alcohol Use Yes   Alcohol/week: 0.0 standard drinks of alcohol   Comment: occasional     Social History   Substance and Sexual Activity  Drug Use Yes   Types: Marijuana, Cocaine, Heroin   Comment: last use cocaine 08/08/15, last use MJ 08/08/15    Social History   Socioeconomic History   Marital status: Single    Spouse name: Not on file   Number of children: 0   Years of education: Not on file   Highest education level: Not on file  Occupational History   Occupation: unemployed  Tobacco Use   Smoking status: Former    Packs/day: 0.50    Years: 6.00    Additional pack years: 0.00  Total pack years: 3.00    Types: Cigarettes   Smokeless tobacco: Never  Vaping Use   Vaping Use: Never used  Substance and Sexual Activity   Alcohol use: Yes    Alcohol/week: 0.0 standard drinks of alcohol    Comment: occasional   Drug use: Yes    Types: Marijuana, Cocaine, Heroin    Comment: last use cocaine 08/08/15, last use MJ 08/08/15   Sexual activity: Yes  Other Topics Concern   Not on file  Social History Narrative   Single,prior worked as Teacher, 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   No children.  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   09/2017.    Social Determinants of Health   Financial Resource Strain: Not on file  Food Insecurity: Not on file  Transportation Needs: Not on file  Physical Activity: Not on file  Stress: Not on file  Social Connections: Not on file   Additional Social History:    Sleep: Fair  Appetite:  Good  Current Medications: Current Facility-Administered Medications  Medication Dose Route Frequency Provider Last Rate Last Admin   acetaminophen (TYLENOL) tablet 650 mg  650 mg Oral Q6H PRN Vesta Mixer, NP   650 mg at 06/21/22 0254   alum & mag hydroxide-simeth (MAALOX/MYLANTA) 200-200-20 MG/5ML suspension  30 mL  30 mL Oral Q4H PRN Vesta Mixer, NP       amLODipine (NORVASC) tablet 5 mg  5 mg Oral Daily Massengill, Nathan, MD   5 mg at 06/22/22 [id number]   And   irbesartan (AVAPRO) tablet 150 mg  150 mg Oral Daily Massengill, Ovid Curd, MD   150 mg at 06/22/22 0815   bacitracin ointment   Topical BID Janine Limbo, MD   Given at 06/22/22 0800   buPROPion (WELLBUTRIN XL) 24 hr tablet 150 mg  150 mg Oral Daily Vesta Mixer, NP   150 mg at 06/22/22 0815   haloperidol (HALDOL) tablet 5 mg  5 mg Oral TID PRN Vesta Mixer, NP       Or   haloperidol lactate (HALDOL) injection 5 mg  5 mg Intramuscular TID PRN Vesta Mixer, NP       HYDROcodone-acetaminophen (NORCO) 10-325 MG per tablet 1 tablet  1 tablet Oral TID PRN Ranae Palms, MD       hydrOXYzine (ATARAX) tablet 25 mg  25 mg Oral TID PRN Vesta Mixer, NP   25 mg at 06/21/22 2158   loperamide (IMODIUM) capsule 2-4 mg  2-4 mg Oral PRN Nicholes Rough, NP       LORazepam (ATIVAN) injection 2 mg  2 mg Intramuscular TID PRN Vesta Mixer, NP       LORazepam (ATIVAN) tablet 1 mg  1 mg Oral Q6H PRN Nkwenti, Doris, NP       magnesium hydroxide (MILK OF MAGNESIA) suspension 30 mL  30 mL Oral Daily PRN Vesta Mixer, NP       melatonin tablet 3 mg  3 mg Oral QHS Nkwenti, Doris, NP   3 mg at 06/21/22 2158   multivitamin with minerals tablet 1 tablet  1 tablet Oral Daily Nicholes Rough, NP   1 tablet at 06/22/22 0815   ondansetron (ZOFRAN-ODT) disintegrating tablet 4 mg  4 mg Oral Q6H PRN Nicholes Rough, NP       rosuvastatin (CRESTOR) tablet 10 mg  10 mg Oral Daily Nicholes Rough, NP   10 mg at 06/22/22 0815   thiamine (Vitamin B-1) tablet 100 mg  100 mg Oral Daily  Nicholes Rough, NP   100 mg at 06/22/22 [id number]   thiamine (VITAMIN B1) injection 100 mg  100 mg Intramuscular Once Nicholes Rough, NP        Lab Results:  Results for orders placed or performed during the hospital encounter of 06/20/22 (from the past 48 hour(s))   Comprehensive metabolic panel     Status: Abnormal   Collection Time: 06/20/22  2:21 PM  Result Value Ref Range   Sodium 136 135 - 145 mmol/L   Potassium 3.4 (L) 3.5 - 5.1 mmol/L   Chloride 102 98 - 111 mmol/L   CO2 25 22 - 32 mmol/L   Glucose, Bld 98 70 - 99 mg/dL    Comment: Glucose reference range applies only to samples taken after fasting for at least 8 hours.   BUN 24 (H) 6 - 20 mg/dL   Creatinine, Ser 1.61 (H) 0.61 - 1.24 mg/dL   Calcium 9.1 8.9 - 10.3 mg/dL   Total Protein 6.8 6.5 - 8.1 g/dL   Albumin 4.5 3.5 - 5.0 g/dL   AST 107 (H) 15 - 41 U/L   ALT 58 (H) 0 - 44 U/L   Alkaline Phosphatase 38 38 - 126 U/L   Total Bilirubin 4.8 (H) 0.3 - 1.2 mg/dL   GFR, Estimated 51 (L) >60 mL/min    Comment: (NOTE) Calculated using the CKD-EPI Creatinine Equation (2021)    Anion gap 9 5 - 15    Comment: Performed at [facility], [place] [address], [place], [place] [address]  Ethanol     Status: None   Collection Time: 06/20/22  2:21 PM  Result Value Ref Range   Alcohol, Ethyl (B) <10 <10 mg/dL    Comment: (NOTE) Lowest detectable limit for serum alcohol is 10 mg/dL.  For medical purposes only. Performed at [facility], [place] [address], [place], [place] [address]   CBC with Diff     Status: Abnormal   Collection Time: 06/20/22  2:21 PM  Result Value Ref Range   WBC 7.8 4.0 - 10.5 K/uL   RBC 5.23 4.22 - 5.81 MIL/uL   Hemoglobin 12.7 (L) 13.0 - 17.0 g/dL   HCT 39.0 39.0 - 52.0 %   MCV 74.6 (L) 80.0 - 100.0 fL   MCH 24.3 (L) 26.0 - 34.0 pg   MCHC 32.6 30.0 - 36.0 g/dL   RDW 15.8 (H) 11.5 - 15.5 %   Platelets 233 150 - 400 K/uL   nRBC 0.0 0.0 - 0.2 %   Neutrophils Relative % 69 %   Neutro Abs 5.3 1.7 - 7.7 K/uL   Lymphocytes Relative 22 %   Lymphs Abs 1.7 0.7 - 4.0 K/uL   Monocytes Relative 9 %   Monocytes Absolute 0.7 0.1 - 1.0 K/uL   Eosinophils Relative 0 %   Eosinophils Absolute 0.0 0.0 - 0.5 K/uL   Basophils Relative 0 %   Basophils Absolute 0.0 0.0 -  0.1 K/uL   Immature Granulocytes 0 %   Abs Immature Granulocytes 0.03 0.00 - 0.07 K/uL    Comment: Performed at [facility], [address] [place], [place] [address]  Acetaminophen level     Status: Abnormal   Collection Time: 06/20/22  2:21 PM  Result Value Ref Range   Acetaminophen (Tylenol), Serum <10 (L) 10 - 30 ug/mL    Comment: (NOTE) Therapeutic concentrations vary significantly. A range of 10-30 ug/mL  may be an effective concentration for many patients.  However, some  are best treated at concentrations outside of this range. Acetaminophen concentrations >150 ug/mL at 4 hours after ingestion  and >50 ug/mL at 12 hours after ingestion are often associated with  toxic reactions.  Performed at [facility], [place] [address], [place], [place] [id number]   Salicylate level     Status: Abnormal   Collection Time: 06/20/22  2:21 PM  Result Value Ref Range   Salicylate Lvl [id number] (L) 7.0 - 30.0 mg/dL    Comment: Performed at [place] [address], [place], [place] [address]  Resp panel by RT-PCR (RSV, Flu A&B, Covid) Anterior Nasal Swab     Status: None   Collection Time: 06/20/22  2:53 PM   Specimen: Anterior Nasal Swab  Result Value Ref Range   SARS Coronavirus 2 by RT PCR NEGATIVE NEGATIVE   Influenza A by PCR NEGATIVE NEGATIVE   Influenza B by PCR NEGATIVE NEGATIVE    Comment: (NOTE) The Xpert Xpress SARS-CoV-2/FLU/RSV plus assay is intended as an aid in the diagnosis of influenza from Nasopharyngeal swab specimens and should not be used as a sole basis for treatment. Nasal washings and aspirates are unacceptable for Xpert Xpress SARS-CoV-2/FLU/RSV testing.  Fact Sheet for Patients: EntrepreneurPulse.com.au  Fact Sheet for Healthcare Providers: IncredibleEmployment.be  This test is not yet approved or cleared by the [place] FDA and has been authorized for detection and/or diagnosis of SARS-CoV-2 by FDA  under an Emergency Use Authorization (EUA). This EUA will remain in effect (meaning this test can be used) for the duration of the COVID-19 declaration under Section 564(b)(1) of the Act, 21 U.S.C. section 360bbb-3(b)(1), unless the authorization is terminated or revoked.     Resp Syncytial Virus by PCR NEGATIVE NEGATIVE    Comment: (NOTE) Fact Sheet for Patients: EntrepreneurPulse.com.au  Fact Sheet for Healthcare Providers: IncredibleEmployment.be  This test is not yet approved or cleared by the [place] FDA and has been authorized for detection and/or diagnosis of SARS-CoV-2 by FDA under an Emergency Use Authorization (EUA). This EUA will remain in effect (meaning this test can be used) for the duration of the COVID-19 declaration under Section 564(b)(1) of the Act, 21 U.S.C. section 360bbb-3(b)(1), unless the authorization is terminated or revoked.  Performed at [facility], [place] [address] [place], [place] [address]   Urine rapid drug screen (hosp performed)     Status: Abnormal   Collection Time: 06/20/22  5:47 PM  Result Value Ref Range   Opiates NONE DETECTED NONE DETECTED   Cocaine POSITIVE (A) NONE DETECTED   Benzodiazepines POSITIVE (A) NONE DETECTED   Amphetamines NONE DETECTED NONE DETECTED   Tetrahydrocannabinol NONE DETECTED NONE DETECTED   Barbiturates NONE DETECTED NONE DETECTED    Comment: (NOTE) DRUG SCREEN FOR MEDICAL PURPOSES ONLY.  IF CONFIRMATION IS NEEDED FOR ANY PURPOSE, NOTIFY LAB WITHIN 5 DAYS.  LOWEST DETECTABLE LIMITS FOR URINE DRUG SCREEN Drug Class                     Cutoff (ng/mL) Amphetamine and metabolites    1000 Barbiturate and metabolites    200 Benzodiazepine                 200 Opiates and metabolites        300 Cocaine and metabolites        300 THC  50 Performed at [facility], [place] [address], [place],  [address]    Blood Alcohol level:   Lab Results  Component Value Date   [facility] <10 06/20/2022   ETH <10 [id number]   Metabolic Disorder Labs: Lab Results  Component Value Date   HGBA1C 5.6 08/31/2016   MPG 114 08/31/2016   MPG 120 (H) 05/24/2015   No results found for: "PROLACTIN" Lab Results  Component Value Date   CHOL 249 (H) 03/04/2018   TRIG 137 03/04/2018   HDL 52 03/04/2018   CHOLHDL 4.8 03/04/2018   VLDL 33 (H) 08/31/2016   LDLCALC 170 (H) 03/04/2018   LDLCALC 159 (H) 10/08/2017   Physical Findings: AIMS:  , ,  ,  ,    CIWA:  CIWA-Ar Total: 1 COWS:     Musculoskeletal: Strength & Muscle Tone: within normal limits Gait & Station: unsteady Patient leans:  Walks with a rolling walker  Psychiatric Specialty Exam:  Presentation  General Appearance:  Casual; Fairly Groomed  Eye Contact: Good  Speech: Clear and Coherent; Normal Rate  Speech Volume: Normal  Handedness: Right  Mood and Affect  Mood: Depressed; Anxious  Affect: Congruent  Thought Process  Thought Processes: Coherent  Descriptions of Associations:Intact  Orientation:Full (Time, Place and Person)  Thought Content:Logical  History of Schizophrenia/Schizoaffective disorder:No data recorded Duration of Psychotic Symptoms:No data recorded Hallucinations:Hallucinations: None  Ideas of Reference:None  Suicidal Thoughts:Suicidal Thoughts: Yes, Active SI Active Intent and/or Plan: Without Intent; Without Plan; Without Means to Carry Out SI Passive Intent and/or Plan: -- (Denies)  Homicidal Thoughts:Homicidal Thoughts: No  Sensorium  Memory: Immediate Good; Recent Good  Judgment: Poor  Insight: Poor  Executive Functions  Concentration: Good  Attention Span: Good  Recall: Good  Fund of Knowledge: Fair  Language: Good  Psychomotor Activity  Psychomotor Activity: Psychomotor Activity: Decreased (With a walker due to back and hip pain)  Assets  Assets: Communication Skills; Social Support;  Physical Health  Sleep  Sleep: Sleep: Fair Number of Hours of Sleep: 4  Physical Exam: Physical Exam Vitals and nursing note reviewed.  HENT:     Head: Normocephalic.     Nose: Nose normal.     Mouth/Throat:     Mouth: Mucous membranes are moist.     Pharynx: Oropharynx is clear.  Eyes:     Conjunctiva/sclera: Conjunctivae normal.     Pupils: Pupils are equal, round, and reactive to light.  Cardiovascular:     Rate and Rhythm: Normal rate.     Pulses: Normal pulses.     Comments: Blood pressure 143/100 pulse 69.  Patient asymptomatic.  Nursing staff to recheck vital signs. Abdominal:     Palpations: Abdomen is soft.  Genitourinary:    Comments: Deferred Musculoskeletal:        General: Normal range of motion.     Cervical back: Normal range of motion.  Skin:    General: Skin is warm.  Neurological:     General: No focal deficit present.     Mental Status: He is alert and oriented to person, place, and time.  Psychiatric:        Behavior: Behavior normal.   Review of Systems  Constitutional: Negative.   HENT: Negative.    Eyes: Negative.   Respiratory: Negative.    Cardiovascular: Negative.   Gastrointestinal: Negative.   Genitourinary: Negative.   Musculoskeletal: Negative.   Skin: Negative.   Neurological: Negative.   Endo/Heme/Allergies: Negative.   Psychiatric/Behavioral:  Positive  for depression, substance abuse and suicidal ideas. The patient is nervous/anxious and has insomnia.    Blood pressure (!) 143/100, pulse 69, temperature 97.9 F (36.6 C), temperature source Oral, resp. rate 16, height 6\' 2"  (1.88 m), weight 101.1 kg, SpO2 100 %. Body mass index is 28.61 kg/m.  Treatment Plan Summary: Diagnoses:  Principal Problem:   MDD (major depressive disorder), recurrent severe, without psychosis ([place]) Active Problems:   Essential hypertension, benign   Insomnia   Cocaine use disorder ([facility])   Chronic pain syndrome   GAD (generalized anxiety  disorder)   Medications -Start Wellbutrin XR 150 mg daily for depressive symptoms (Home med) -Start Norco 1tab Q 8 H PRN for pain (home med) -Start Avapro 150 mg/Norvasc 5 mg  daily for htn (home med) -Start Hydroxyzine 25 mg TID PRN for anxiety -Start Melatonin 3 mg nightly for sleep -Start Crestor 10 mg daily for hyperlipidemia (home med) -Start Ativan 1mg  Q6H PRN for CIWA scores >10 -Start agitation protocol Zyprexa/Ativan/Geodon PRN   PRNS -Continue Tylenol 650 mg every 6 hours PRN for mild pain -Continue Maalox 30 mg every 4 hrs PRN for indigestion -Continue Imodium 2-4 mg as needed for diarrhea -Continue Milk of Magnesia as needed every 6 hrs for constipation -Continue Zofran disintegrating tabs every 6 hrs PRN for nausea    Discharge Planning: Social work and case management to assist with discharge planning and identification of hospital follow-up needs prior to discharge Estimated LOS: 5-7 days Discharge Concerns: Need to establish a safety plan; Medication compliance and effectiveness Discharge Goals: Return home with outpatient referrals for mental health follow-up including medication management/psychotherapy  Safety and Monitoring: Voluntary admission to inpatient psychiatric unit for safety, stabilization and treatment Daily contact with patient to assess and evaluate symptoms and progress in treatment Patient's case to be discussed in multi-disciplinary team meeting Observation Level : q15 minute checks Vital signs: q12 hours Precautions: Safety   Long Term Goal(s): Improvement in symptoms so as ready for discharge   Short Term Goals: Ability to identify changes in lifestyle to reduce recurrence of condition will improve, Ability to disclose and discuss suicidal ideas, Ability to demonstrate self-control will improve, Ability to identify and develop effective coping behaviors will improve, Ability to maintain clinical measurements within normal limits will improve,  Compliance with prescribed medications will improve, and Ability to identify triggers associated with substance abuse/mental health issues will improve    I certify that inpatient services furnished can reasonably be expected to improve the patient's condition.     Laretta Bolster, FNP 06/22/2022, 10:56 AM

## 2022-06-22 NOTE — [facility] Group Notes (Signed)
PsychoEducational Group Patients were asked to reflect on how to promote wellbeing with healthy lifestyle choices. Education regarding healthy sleep habits, and lifestyle impact mental health. Conversely patients were then asked to identify warning signs of when they are not well and how to integrate healthy coping skills/lifestyle to help.  Patient attended and left group early, did not participate.

## 2022-06-22 NOTE — Progress Notes (Signed)
    06/21/22 2202  Psych Admission Type (Psych Patients Only)  Admission Status Voluntary  Psychosocial Assessment  Patient Complaints Anxiety;Depression  Eye Contact Brief  Facial Expression Anxious  Affect Depressed  Speech Logical/coherent  Interaction Assertive  Motor Activity Slow  Appearance/Hygiene Unremarkable  Behavior Characteristics Cooperative;Appropriate to situation  Mood Anxious  Thought Process  Coherency WDL  Content WDL  Delusions None reported or observed  Perception WDL  Hallucination None reported or observed  Judgment Poor  Confusion None  Danger to Self  Current suicidal ideation? Denies  Agreement Not to Harm Self Yes  Description of Agreement verbal  Danger to Others  Danger to Others None reported or observed

## 2022-06-22 NOTE — Progress Notes (Signed)
   06/22/22 1954  Psych Admission Type (Psych Patients Only)  Admission Status Voluntary  Psychosocial Assessment  Patient Complaints Anxiety  Eye Contact Brief  Facial Expression Anxious  Affect Depressed  Speech Logical/coherent  Interaction Assertive  Motor Activity Slow  Appearance/Hygiene Unremarkable  Behavior Characteristics Cooperative;Appropriate to situation  Mood Anxious  Thought Process  Coherency WDL  Content Blaming self  Delusions None reported or observed  Perception WDL  Hallucination None reported or observed  Judgment Poor  Confusion None  Danger to Self  Current suicidal ideation? Denies  Agreement Not to Harm Self Yes  Description of Agreement verbal  Danger to Others  Danger to Others None reported or observed

## 2022-06-22 NOTE — Group Note (Signed)
Date:  06/22/2022 Time:  10:46 AM  Group Topic/Focus:  Orientation:   The focus of this group is to educate the patient on the purpose and policies of crisis stabilization and provide a format to answer questions about their admission.  The group details unit policies and expectations of patients while admitted.    Participation Level:  Active  Participation Quality:  Appropriate  Affect:  Appropriate  Cognitive:  Appropriate  Insight: Appropriate  Engagement in Group:  Engaged  Modes of Intervention:  Discussion  Additional Comments:     [name] 06/22/2022, 10:46 AM

## 2022-06-22 NOTE — Progress Notes (Signed)
   06/22/22 0531  15 Minute Checks  Location Bedroom  Visual Appearance Calm  Behavior Sleeping  Sleep (Behavioral Health Patients Only)  Calculate sleep? (Click Yes once per 24 hr at 0600 safety check) Yes  Documented sleep last 24 hours 8.5

## 2022-06-22 NOTE — Evaluation (Signed)
Occupational Therapy Evaluation Patient Details Name: Malakhi Kibbey MRN: [id number] DOB: 1966-02-03 Today's Date: 06/22/2022   History of Present Illness   Reason for Admission: Avantae Cercone is a 57 yo [place] American male with a history of MDD and polysubstance use who was taken to the [facility] ED by EMS after being found by a roadside, verbalizing suicidal ideations after making a deep cut to his left thumb area. Pt was medically treated and cleared prior to being transferred voluntarily to this [facility] for treatment and stabilization of his mental status.    Clinical Impression   OT met w/ pt in hallway where pt is found standing and speaking on the hallway telephone. Pt ambulates w/o AD from 400 hallway phone to room 400 w/o RW and w/o LOB. Pt is able to perform sit/std and entery/exits bed w/ independence. Pt's CC is pain rated 6-7/10 at low back  / pt reports pain is as a consequence of falling on wet grass and landing on buttocks. At this time pt presents w/ 5/5 UB MMT and there are not ROM deficits. Balance is overall intact however pain does require pt to ambulate slowly and reports the RW he has been using in this setting does help in his standing from a seated position. LB ADLs are performed independently but w/ extra time. Pt reports stiffness in LB. OT educates pt on positioning and posture and anterior / posterior pelvic tilt stretching / pt reports positive results during trial. Pt reports he is an active patient of Dr Nelva Bush at [facility] and is also engaged in Physical Therapy and pain mgmt there. Plans to resume his care there once he is discharged. OT does not identify any continued needs for OT or PT at this time and expects pt to make a full recovery in order to engage in ambulation pain free once acute pain d/t fall has subsided. OT will now sign off. Please re-consult as needed.       Recommendations for follow up therapy are one component of a multi-disciplinary discharge  planning process, led by the attending physician.  Recommendations may be updated based on patient status, additional functional criteria and insurance authorization.                              Precautions / Restrictions Precautions Precautions: Fall Precaution Comments: fall precautions are d/t pain s/p accidental fall at home where he reports he slipped on uneven wet grass. Restrictions Weight Bearing Restrictions: No      Mobility Bed Mobility Overal bed mobility: Independent             General bed mobility comments: pt is now using a walker d/t pain s/p an accidental fall at home where he slipped on wet grass / states he does not use an assistive device at home other than a SPC and only rarely    Transfers Overall transfer level: Independent Equipment used: Rolling walker (2 wheels)               General transfer comment: is able to transfer w/ and w/o RW w/o imbalance only additional time d/t LB pain      Balance Overall balance assessment: Modified Independent  ADL either performed or assessed with clinical judgement   ADL Overall ADL's : Independent                                       General ADL Comments: requires additinal time to perform LB tasks, however he is able to perform independently                         Pertinent Vitals/Pain Pain Assessment Pain Assessment: 0-10 Pain Score: 7  Pain Descriptors / Indicators: Cramping Pain Intervention(s): Repositioned                       Communication     Cognition Arousal/Alertness: Awake/alert   Overall Cognitive Status: Within Functional Limits for tasks assessed                                       General Comments  overall balance is intact, however pain may influence mobility, however pt reports mobility inhibitions are improving as pain subsides               Home  Living Family/patient expects to be discharged to:: Private residence Living Arrangements: Spouse/significant other                                                                OT Problem List: Pain            OT Goals(Current goals can be found in the care plan section) Acute Rehab OT Goals Patient Stated Goal: Pt's primary concern is pain mgmt and DC to home when appropriate OT Goal Formulation: All assessment and education complete, DC therapy   AM-PAC OT "6 Clicks" Daily Activity     Outcome Measure Help from another person eating meals?: None Help from another person taking care of personal grooming?: None Help from another person toileting, which includes using toliet, bedpan, or urinal?: None Help from another person bathing (including washing, rinsing, drying)?: None Help from another person to put on and taking off regular upper body clothing?: None Help from another person to put on and taking off regular lower body clothing?: None 6 Click Score: 24   End of Session Equipment Utilized During Treatment: Rolling walker (2 wheels) Nurse Communication: Mobility status  Activity Tolerance: Patient tolerated treatment well Patient left: in chair  OT Visit Diagnosis: Unsteadiness on feet (R26.81)                Time: [id number] OT Time Calculation (min): 44 min Charges:  OT General Charges $OT Visit: 1 Visit OT Evaluation $OT Eval Low Complexity: 1 Low OT Treatments $Self Care/Home Management : 8-22 mins $Therapeutic Activity: 8-22 mins  Cornell Barman, OT   Brantley Stage 06/22/2022, 6:33 PM

## 2022-06-22 NOTE — BHH Group Notes (Signed)
[place] Group Notes:  (Nursing/MHT/Case Management/Adjunct)  Date:  06/22/2022  Time:  8:48 PM  Type of Therapy:  Group Therapy  Participation Level:  Active  Participation Quality:  Appropriate  Affect:  Appropriate  Cognitive:  Appropriate  Insight:  Appropriate  Engagement in Group:  Engaged  Modes of Intervention:  Discussion  Summary of Progress/Problems:  Benjamin [name] 06/22/2022, 8:48 PM

## 2022-06-22 NOTE — Group Note (Signed)
Date:  06/22/2022 Time:  5:18 PM  Group Topic/Focus:  Spirituality:   The focus of this group is to discuss how one's spirituality can aide in recovery.    Participation Level:  Active  Participation Quality:  Appropriate  Affect:  Appropriate  Cognitive:  Appropriate  Insight: Appropriate  Engagement in Group:  Engaged  Modes of Intervention:  Exploration  Additional Comments:     [name] 06/22/2022, 5:18 PM

## 2022-06-23 DIAGNOSIS — F332 Major depressive disorder, recurrent severe without psychotic features: Secondary | ICD-10-CM | POA: Diagnosis not present

## 2022-06-23 MED ORDER — CEPHALEXIN 500 MG PO CAPS
500.0000 mg | ORAL_CAPSULE | Freq: Two times a day (BID) | ORAL | Status: DC
  Administered 2022-06-23 – 2022-06-29 (×12): 500 mg via ORAL
  Filled 2022-06-23 (×14): qty 1
  Filled 2022-06-23: qty 2

## 2022-06-23 NOTE — Progress Notes (Signed)
   06/23/22 1000  Psych Admission Type (Psych Patients Only)  Admission Status Voluntary  Psychosocial Assessment  Patient Complaints Anxiety;Irritability  Eye Contact Brief  Facial Expression Anxious  Affect Depressed;Irritable  Speech Logical/coherent  Interaction Assertive  Motor Activity Slow  Appearance/Hygiene Unremarkable  Behavior Characteristics Cooperative;Appropriate to situation  Mood Anxious  Thought Process  Coherency WDL  Content Blaming self  Delusions None reported or observed  Perception WDL  Hallucination None reported or observed  Judgment Poor  Confusion None  Danger to Self  Current suicidal ideation? Denies  Agreement Not to Harm Self Yes  Description of Agreement verbal  Danger to Others  Danger to Others None reported or observed     Pt has calmed down from this morning.  Pt is concerned about self-inflicted wound on left hand.  RN informed provider that pt is requesting wound consult.  Pt is coping well at this time and his mood has improved as pt is no longer irritable.

## 2022-06-23 NOTE — BHH Group Notes (Signed)
[place] Group Notes:  (Nursing/MHT/Case Management/Adjunct)  Date:  06/23/2022  Time:  8:34 PM  Type of Therapy:   AA  Participation Level:  Active  Participation Quality:  Appropriate  Affect:  Appropriate  Cognitive:  Appropriate  Insight:  Appropriate  Engagement in Group:  Engaged  Modes of Intervention:  Education  Summary of Progress/Problems: Attended AA meeting.  Orvan Falconer 06/23/2022, 8:34 PM

## 2022-06-23 NOTE — BHH Group Notes (Signed)
Adult Psychoeducational Group Note  Date:  06/23/2022 Time:  11:52 AM  Group Topic/Focus:  Goals Group:   The focus of this group is to help patients establish daily goals to achieve during treatment and discuss how the patient can incorporate goal setting into their daily lives to aide in recovery.  Participation Level:  Did Not Attend  Additional Comments:  Pt did not attend group after being encouraged to go.   Victorino December 06/23/2022, 11:52 AM

## 2022-06-23 NOTE — Group Note (Signed)
Recreation Therapy Group Note   Group Topic:Stress Management  Group Date: 06/23/2022 Start Time: 0930 End Time: 0955 Facilitators: Zeda Gangwer-McCall, LRT,CTRS Location: 300 Hall Dayroom   Goal Area(s) Addresses:  Patient will identify positive stress management techniques. Patient will identify benefits of using stress management post d/c.  Group Description:  Meditation.  LRT explained to patients what group would consist of, they should focus on their breathing and be in a comfortable position.  LRT proceeded to play a meditation called 'Stop Holding Yourself Back'.  The meditation wanted patients to identify habits they that have prevented them from the life they want for themselves.  It also encouraged to hold onto the positive image they have of their future selves and to working to get to that point.   Affect/Mood: Appropriate   Participation Level: Engaged   Participation Quality: Independent   Behavior: Attentive    Speech/Thought Process: Focused   Insight: Good   Judgement: Good   Modes of Intervention: App   Patient Response to Interventions:  Engaged   Education Outcome:  Acknowledges education and In group clarification offered    Clinical Observations/Individualized Feedback: Pt attended and participated in group session.    Plan: Continue to engage patient in RT group sessions 2-3x/week.   Dayshon Roback-McCall, LRT,CTRS  06/23/2022 12:25 PM

## 2022-06-23 NOTE — Progress Notes (Signed)
  Patient reports pain 7/10.  Administered PRN Hydrocodone per [facility] per patient request.

## 2022-06-23 NOTE — BHH Suicide Risk Assessment (Signed)
[place] INPATIENT:  Family/Significant Other Suicide Prevention Education  Suicide Prevention Education:  Education Completed; C-Chrystine Weston Settle [phone],  (name of family member/significant other) has been identified by the patient as the family member/significant other with whom the patient will be residing, and identified as the person(s) who will aid the patient in the event of a mental health crisis (suicidal ideations/suicide attempt).  With written consent from the patient, the family member/significant other has been provided the following suicide prevention education, prior to the and/or following the discharge of the patient.   CSW spoke with patient spouse and completed safety planning. Spouse stated that patient went downhill once finding out that their dog was dying in Feb. Spouse states that in Dec. The doctor made a error and the dog died faster than expected. Furthermore,  spouse noticed changes within patient because he was trying to be strong and there for her due his drastic situation. Spouse continued on saying that pt stopped taking his medication and told his psychiatrist what all was going on with him and the psychiatrist prescribed him Xanax and spouse said that he shouldn't have and she will call him to follow up. Spouse said that once patient is free of feeling/having SI and DC he will go straight to [facility] in [place] on a Scholarship, so it will be door to door. Spouse said that the program is 3 months long and she will be arranging transportation for him to go there.   The suicide prevention education provided includes the following: Suicide risk factors Suicide prevention and interventions National Suicide Hotline telephone number [facility] assessment telephone number [facility] Emergency Assistance [place] and/or Residential Mobile Crisis Unit telephone number  Request made of family/significant other to: Remove weapons  (e.g., guns, rifles, knives), all items previously/currently identified as safety concern.   Remove drugs/medications (over-the-counter, prescriptions, illicit drugs), all items previously/currently identified as a safety concern.  The family member/significant other verbalizes understanding of the suicide prevention education information provided.  The family member/significant other agrees to remove the items of safety concern listed above.  Sherre Lain 06/23/2022, 1:27 PM

## 2022-06-23 NOTE — Progress Notes (Signed)
   06/23/22 0533  15 Minute Checks  Location Bedroom  Visual Appearance Calm  Behavior Sleeping  Sleep (Behavioral Health Patients Only)  Calculate sleep? (Click Yes once per 24 hr at 0600 safety check) Yes  Documented sleep last 24 hours 7.5

## 2022-06-23 NOTE — Progress Notes (Signed)
[facility] MD Progress Note  06/23/2022 8:43 AM Benjamin [name]  MRN:  [id number]  Principal Problem: MDD (major depressive disorder), recurrent severe, without psychosis  Diagnosis: Principal Problem:   MDD (major depressive disorder), recurrent severe, without psychosis ([place]) Active Problems:   Essential hypertension, benign   Insomnia   Cocaine use disorder ([place])   Chronic pain syndrome   GAD (generalized anxiety disorder)  Reason for admission: Patient reports that "Last Saturday, I blacked out.", but then goes on to recount the series of events that happened during the time of his black out, as quoted above under "Chief compliant", and states that he remembers the details.  He states that Xanax which was a new prescription which he had taken for a month prior to the events above, triggered the episode that he had leading up to this hospitalization.  He admits to feeling suicidal during that time, states that he was on "a suicidal mission", reports that he made a cut to his left hand with a piece of glass that he found, and is observed to have a deep cut to his left thumb area.  Orders placed for bacitracin ointment, and for nursing to cleanse the area with normal saline solution, apply bacitracin ointment to area, prior to covering with a nonadherent dressing or Band-Aid. He states that a tetanus vaccine was administered to him in the ER.   24-hour chart Review: Past 24 hours of patient's chart was reviewed.  Patient is compliant with scheduled meds. Required Agitation PRNs: None Per RN notes, no documented behavioral issues and is attending group. Patient slept, 8 hours.   He continues to require/receive pain management.  Daily notes: [name] is seen, chart reviewed. The chart findings discussed with the treatment team. He presents alert, oriented & aware of situation. He is visible on the unit, attending group sessions. Today, patient is observed ambulating without the walker. His gait & balance  seem steady. He reports,  "I feel a little stressed now that all the things that transcended prior to me coming to the hospital are becoming clearer. Now that the suicidal ideations are clearing, I'm faced with a lot of guilt & shame. I'm also trying very hard to deal with it. At what point do I know that I'm fit to go home, that I still do not know. But, I know I'm going to go home eventually. And for this reason, today has been very rough for me. Things are starting to surface. How do I handle them once I get home. I'm still feeling like hurting myself". Patient is instructed & encouraged that at this point, he is receiving medication management for his symptoms. He is also enrolled in/participating in the group sessions. And by discharge, the goal is for him to have learned some adequate coping skills that will help him cope better with his stress. Patient continues to required & receive pain management. He is taking & tolerating his treatment regimen. Denies any Side effects. Patient is started on Keflex 500 mg po bid x 7 days for prevention of wound infection to the back his left hand. See the order for dressing changes. Patient has requested a wound consult.  Total Time spent with patient: 30 minutes  Past Psychiatric History:  Previous Psych Diagnoses: MDD, polysubstance abuse Prior inpatient treatment: [facility] admissions on 08/13/15, 08/24/15, 11/13/17 & 11/26/17. Current/prior outpatient treatment: "Dr Sherrie Mustache" Prior rehab [id number]  Psychotherapy [id number] having one currently  History of suicide attempts: Multiple via cutting since childhood as per  patient and via sitting on railroad tracks while living in [place]    Past Medical History:  Past Medical History:  Diagnosis Date   Chronic back pain    managed by [facility]   CKD (chronic kidney disease), stage III ([place]) 10/2017   Erectile dysfunction    History of substance abuse ([place])    Hx of suicide attempt    Hypertension      Past Surgical History:  Procedure Laterality Date   ANKLE SURGERY     right   APPLICATION OF WOUND VAC Right 08/17/2015   Procedure: APPLICATION OF WOUND VAC;  Surgeon: Roseanne Kaufman, MD;  Location: [place];  Service: Orthopedics;  Laterality: Right;   HAND SURGERY     fracture, ORIG, teenage years, right.   I & D EXTREMITY Right 08/10/2015   Procedure: IRRIGATION AND DEBRIDEMENT SKIN, SUBCUTANEOS TISSUE AND  MUSCLE, CARPAL TUNNEL RELEASE, MEDIAN NERVE LYSIS WITH NERVE WRAP;  Surgeon: Roseanne Kaufman, MD;  Location: [place];  Service: Orthopedics;  Laterality: Right;   INCISION AND DRAINAGE OF WOUND Right 08/15/2015   Procedure: IRRIGATION AND DEBRIDEMENT WOUND;  Surgeon: Justice Britain, MD;  Location: [facility];  Service: Orthopedics;  Laterality: Right;   LUMBAR EPIDURAL INJECTION     [facility]   SKIN SPLIT GRAFT Right 08/17/2015   Procedure: I&D RIGHT FOREARM/POSSIBLE SKIN GRAFT;  Surgeon: Roseanne Kaufman, MD;  Location: [place];  Service: Orthopedics;  Laterality: Right;   TENDON REPAIR N/A 07/24/2015   Procedure: WRIST LACERATION AND TENDON REPAIR;  Surgeon: Iran Planas, MD;  Location: [place];  Service: Orthopedics;  Laterality: N/A;   WOUND EXPLORATION Right 08/15/2015   Procedure: WOUND EXPLORATION right forearm;  Surgeon: Justice Britain, MD;  Location: [facility];  Service: Orthopedics;  Laterality: Right;   Family History:  Family History  Problem Relation Age of Onset   Cancer Mother        lung, smoker   Anxiety disorder Mother    Cancer Father        throat   Diabetes Paternal Grandmother    Cancer Paternal Aunt    Colon cancer Paternal Aunt    Heart disease Neg Hx    Stroke Neg Hx    Esophageal cancer Neg Hx    Inflammatory bowel disease Neg Hx    Liver disease Neg Hx    Pancreatic cancer Neg Hx    Rectal cancer Neg Hx    Stomach cancer Neg Hx    Family Psychiatric  History:  Mother with MDD, attempted suicide multiple times, didn't succeed, but, but died of  cancer. Psych [id number] SA/HA:mother, but didn't complete Substance use family [id number]   Social History:  Social History   Substance and Sexual Activity  Alcohol Use Yes   Alcohol/week: 0.0 standard drinks of alcohol   Comment: occasional     Social History   Substance and Sexual Activity  Drug Use Yes   Types: Marijuana, Cocaine, Heroin   Comment: last use cocaine 08/08/15, last use MJ 08/08/15    Social History   Socioeconomic History   Marital status: Single    Spouse name: Not on file   Number of children: 0   Years of education: Not on file   Highest education level: Not on file  Occupational History   Occupation: unemployed  Tobacco Use   Smoking status: Former    Packs/day: 0.50    Years: 6.00    Additional pack years: 0.00  Total pack years: 3.00    Types: Cigarettes   Smokeless tobacco: Never  Vaping Use   Vaping Use: Never used  Substance and Sexual Activity   Alcohol use: Yes    Alcohol/week: 0.0 standard drinks of alcohol    Comment: occasional   Drug use: Yes    Types: Marijuana, Cocaine, Heroin    Comment: last use cocaine 08/08/15, last use MJ 08/08/15   Sexual activity: Yes  Other Topics Concern   Not on file  Social History Narrative   Single,prior worked as Teacher, 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   No children.  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   09/2017.    Social Determinants of Health   Financial Resource Strain: Not on file  Food Insecurity: Not on file  Transportation Needs: Not on file  Physical Activity: Not on file  Stress: Not on file  Social Connections: Not on file   Additional Social History:    Sleep: Fair  Appetite:  Good  Current Medications: Current Facility-Administered Medications  Medication Dose Route Frequency Provider Last Rate Last Admin   acetaminophen (TYLENOL) tablet 650 mg  650 mg Oral Q6H PRN Vesta Mixer, NP   650 mg at 06/21/22 0254   alum & mag hydroxide-simeth  (MAALOX/MYLANTA) 200-200-20 MG/5ML suspension 30 mL  30 mL Oral Q4H PRN Vesta Mixer, NP       amLODipine (NORVASC) tablet 5 mg  5 mg Oral Daily Massengill, Nathan, MD   5 mg at 06/22/22 [id number]   And   irbesartan (AVAPRO) tablet 150 mg  150 mg Oral Daily Massengill, Ovid Curd, MD   150 mg at 06/22/22 0815   bacitracin ointment   Topical BID Janine Limbo, MD   [id number] Application at [id number] 2104   buPROPion (WELLBUTRIN XL) 24 hr tablet 150 mg  150 mg Oral Daily Vesta Mixer, NP   150 mg at 06/22/22 0815   haloperidol (HALDOL) tablet 5 mg  5 mg Oral TID PRN Vesta Mixer, NP       Or   haloperidol lactate (HALDOL) injection 5 mg  5 mg Intramuscular TID PRN Vesta Mixer, NP       HYDROcodone-acetaminophen (NORCO) 10-325 MG per tablet 1 tablet  1 tablet Oral TID PRN Ranae Palms, MD   1 tablet at 06/23/22 [id number]   hydrOXYzine (ATARAX) tablet 25 mg  25 mg Oral TID PRN Vesta Mixer, NP   25 mg at 06/22/22 1956   loperamide (IMODIUM) capsule 2-4 mg  2-4 mg Oral PRN Nicholes Rough, NP       LORazepam (ATIVAN) injection 2 mg  2 mg Intramuscular TID PRN Vesta Mixer, NP       LORazepam (ATIVAN) tablet 1 mg  1 mg Oral Q6H PRN Nkwenti, Doris, NP       magnesium hydroxide (MILK OF MAGNESIA) suspension 30 mL  30 mL Oral Daily PRN Vesta Mixer, NP       melatonin tablet 3 mg  3 mg Oral QHS Nkwenti, Doris, NP   3 mg at 06/22/22 2103   multivitamin with minerals tablet 1 tablet  1 tablet Oral Daily Nicholes Rough, NP   1 tablet at 06/22/22 0815   ondansetron (ZOFRAN-ODT) disintegrating tablet 4 mg  4 mg Oral Q6H PRN Nicholes Rough, NP       rosuvastatin (CRESTOR) tablet 10 mg  10 mg Oral Daily Nicholes Rough, NP   10 mg at 06/22/22 0815   thiamine (Vitamin B-1) tablet 100 mg  100 mg Oral Daily Nicholes Rough, NP   100 mg at 06/22/22 0815   thiamine (VITAMIN B1) injection 100 mg  100 mg Intramuscular Once Nicholes Rough, NP        Lab Results:  No results found for this or any  previous visit (from the past 48 hour(s)).  Blood Alcohol level:  Lab Results  Component Value Date   ETH <10 06/20/2022   ETH <10 [id number]   Metabolic Disorder Labs: Lab Results  Component Value Date   HGBA1C 5.6 08/31/2016   MPG 114 08/31/2016   MPG 120 (H) 05/24/2015   No results found for: "PROLACTIN" Lab Results  Component Value Date   CHOL 249 (H) 03/04/2018   TRIG 137 03/04/2018   HDL 52 03/04/2018   CHOLHDL 4.8 03/04/2018   VLDL 33 (H) 08/31/2016   LDLCALC 170 (H) 03/04/2018   LDLCALC 159 (H) 10/08/2017   Physical Findings: AIMS:  , ,  ,  ,    CIWA:  CIWA-Ar Total: 1 COWS:     Musculoskeletal: Strength & Muscle Tone: within normal limits Gait & Station: unsteady Patient leans:  Walks with a rolling walker  Psychiatric Specialty Exam:  Presentation  General Appearance:  Casual; Fairly Groomed  Eye Contact: Good  Speech: Clear and Coherent; Normal Rate  Speech Volume: Normal  Handedness: Right  Mood and Affect  Mood: Depressed; Anxious  Affect: Congruent  Thought Process  Thought Processes: Coherent  Descriptions of Associations:Intact  Orientation:Full (Time, Place and Person)  Thought Content:Logical  History of Schizophrenia/Schizoaffective disorder:No data recorded Duration of Psychotic Symptoms:No data recorded Hallucinations:Hallucinations: None  Ideas of Reference:None  Suicidal Thoughts:Suicidal Thoughts: Yes, Active SI Active Intent and/or Plan: Without Intent; Without Plan; Without Means to Carry Out SI Passive Intent and/or Plan: -- (Denies)  Homicidal Thoughts:Homicidal Thoughts: No  Sensorium  Memory: Immediate Good; Recent Good  Judgment: Poor  Insight: Poor  Executive Functions  Concentration: Good  Attention Span: Good  Recall: Good  Fund of Knowledge: Fair  Language: Good  Psychomotor Activity  Psychomotor Activity: Psychomotor Activity: Decreased (With a walker due to back and hip  pain)  Assets  Assets: Communication Skills; Social Support; Physical Health  Sleep  Sleep: Sleep: Fair Number of Hours of Sleep: 4  Physical Exam: Physical Exam Vitals and nursing note reviewed.  HENT:     Head: Normocephalic.     Nose: Nose normal.     Mouth/Throat:     Mouth: Mucous membranes are moist.     Pharynx: Oropharynx is clear.  Eyes:     Conjunctiva/sclera: Conjunctivae normal.     Pupils: Pupils are equal, round, and reactive to light.  Cardiovascular:     Rate and Rhythm: Normal rate.     Pulses: Normal pulses.     Comments: Blood pressure 143/100 pulse 69.  Patient asymptomatic.  Nursing staff to recheck vital signs. Abdominal:     Palpations: Abdomen is soft.  Genitourinary:    Comments: Deferred Musculoskeletal:        General: Normal range of motion.     Cervical back: Normal range of motion.  Skin:    General: Skin is warm.  Neurological:     General: No focal deficit present.     Mental Status: He is alert and oriented to person, place, and time.  Psychiatric:        Behavior: Behavior normal.    Review of Systems  Constitutional: Negative.   HENT: Negative.    Eyes:  Negative.   Respiratory: Negative.    Cardiovascular: Negative.   Gastrointestinal: Negative.   Genitourinary: Negative.   Musculoskeletal: Negative.   Skin: Negative.   Neurological: Negative.   Endo/Heme/Allergies: Negative.   Psychiatric/Behavioral:  Positive for depression, substance abuse and suicidal ideas. The patient is nervous/anxious and has insomnia.    Blood pressure (!) 139/94, pulse 67, temperature 98.1 F (36.7 C), temperature source Oral, resp. rate 16, height 6\' 2"  (1.88 m), weight 101.1 kg, SpO2 99 %. Body mass index is 28.61 kg/m.  Treatment Plan Summary: Diagnoses:  Principal Problem:   MDD (major depressive disorder), recurrent severe, without psychosis ([place]) Active Problems:   Essential hypertension, benign   Insomnia   Cocaine use disorder  ([facility])   Chronic pain syndrome   GAD (generalized anxiety disorder)   Medications -Continue Wellbutrin XR 150 mg daily for depressive symptoms (Home med) -Continue Norco 1tab Q 8 H PRN for pain (home med) -Continue Avapro 150 mg/Norvasc 5 mg  daily for htn (home med) -Continue Hydroxyzine 25 mg TID PRN for anxiety -Continue Melatonin 3 mg nightly for sleep -Continue Crestor 10 mg daily for hyperlipidemia (home med) -Continue Ativan 1mg  Q6H PRN for CIWA scores >10 -Continue agitation protocol Zyprexa/Ativan/Geodon PRN.  -Initiated Keflex 500 mg po bid x 7 days for wound infection.   PRNS -Continue Tylenol 650 mg every 6 hours PRN for mild pain -Continue Maalox 30 mg every 4 hrs PRN for indigestion -Continue Imodium 2-4 mg as needed for diarrhea -Continue Milk of Magnesia as needed every 6 hrs for constipation -Continue Zofran disintegrating tabs every 6 hrs PRN for nausea    Discharge Planning: Social work and case management to assist with discharge planning and identification of hospital follow-up needs prior to discharge Estimated LOS: 5-7 days Discharge Concerns: Need to establish a safety plan; Medication compliance and effectiveness Discharge Goals: Return home with outpatient referrals for mental health follow-up including medication management/psychotherapy  Safety and Monitoring: Voluntary admission to inpatient psychiatric unit for safety, stabilization and treatment Daily contact with patient to assess and evaluate symptoms and progress in treatment Patient's case to be discussed in multi-disciplinary team meeting Observation Level : q15 minute checks Vital signs: q12 hours Precautions: Safety   I certify that inpatient services furnished can reasonably be expected to improve the patient's condition.     Lindell Spar, NP, pmhnp, fnp-bc. 06/23/2022, 8:43 AMPatient ID: Benjamin [name], male   DOB: [birth year], 57 y.o.   MRN: [id number]

## 2022-06-23 NOTE — Progress Notes (Signed)
RN changed dressing to left wrist.  No signs of infection noted.  Pt says he has pain in the area and wants wound consult.  Will send request to provider.

## 2022-06-24 DIAGNOSIS — F332 Major depressive disorder, recurrent severe without psychotic features: Secondary | ICD-10-CM | POA: Diagnosis not present

## 2022-06-24 MED ORDER — AMLODIPINE BESYLATE 10 MG PO TABS: 10.0000 mg | ORAL_TABLET | Freq: Every day | ORAL | Status: DC

## 2022-06-24 MED ORDER — IRBESARTAN 150 MG PO TABS: 150.0000 mg | ORAL_TABLET | Freq: Every day | ORAL | Status: DC

## 2022-06-24 MED ORDER — SODIUM CHLORIDE 0.9 % IN NEBU
INHALATION_SOLUTION | RESPIRATORY_TRACT | Status: AC
  Filled 2022-06-24: qty 6

## 2022-06-24 MED ORDER — IRBESARTAN 300 MG PO TABS
300.0000 mg | ORAL_TABLET | Freq: Every day | ORAL | Status: DC
  Administered 2022-06-25 – 2022-06-29 (×5): 300 mg via ORAL
  Filled 2022-06-24 (×7): qty 1

## 2022-06-24 MED ORDER — BUPROPION HCL ER (XL) 300 MG PO TB24
300.0000 mg | ORAL_TABLET | Freq: Every day | ORAL | Status: DC
  Administered 2022-06-25 – 2022-06-29 (×5): 300 mg via ORAL
  Filled 2022-06-24 (×7): qty 1

## 2022-06-24 MED ORDER — MIRTAZAPINE 7.5 MG PO TABS
7.5000 mg | ORAL_TABLET | Freq: Every day | ORAL | Status: DC
  Administered 2022-06-24 – 2022-06-28 (×5): 7.5 mg via ORAL
  Filled 2022-06-24 (×8): qty 1

## 2022-06-24 MED ORDER — AMLODIPINE BESYLATE 10 MG PO TABS
10.0000 mg | ORAL_TABLET | Freq: Every day | ORAL | Status: DC
  Administered 2022-06-25 – 2022-06-29 (×5): 10 mg via ORAL
  Filled 2022-06-24 (×7): qty 1

## 2022-06-24 NOTE — [facility] Group Notes (Signed)
[facility] Group Notes:  (Nursing/MHT/Case Management/Adjunct)  Date:  06/24/2022  Time:  9:11 PM  Type of Therapy:  Group Therapy  Participation Level:  Active  Participation Quality:  Appropriate  Affect:  Appropriate  Cognitive:  Appropriate  Insight:  Appropriate  Engagement in Group:  Engaged  Modes of Intervention:  Education  Summary of Progress/Problems: Goal to feel better, Pt reports he is feeling better. Day 8/10 Participated in angermanagement exercise.  Orvan Falconer 06/24/2022, 9:11 PM

## 2022-06-24 NOTE — Progress Notes (Signed)
   06/23/22 2300  Psych Admission Type (Psych Patients Only)  Admission Status Voluntary  Psychosocial Assessment  Patient Complaints None  Eye Contact Fair  Facial Expression Anxious  Affect Appropriate to circumstance  Speech Logical/coherent  Interaction Assertive  Motor Activity Slow  Appearance/Hygiene Unremarkable  Behavior Characteristics Appropriate to situation;Cooperative  Mood Pleasant  Thought Process  Coherency WDL  Content WDL  Delusions None reported or observed  Perception WDL  Hallucination None reported or observed  Judgment Poor  Confusion None  Danger to Self  Current suicidal ideation? Denies  Agreement Not to Harm Self Yes  Description of Agreement verbal  Danger to Others  Danger to Others None reported or observed

## 2022-06-24 NOTE — [facility] Group Notes (Signed)
Progress Group Notes:  (Nursing/MHT/Case Management/Adjunct)  Date:  06/24/2022  Time:  9:21 AM  Type of Therapy:  Group Therapy  Participation Level:  Minimal  Participation Quality:  Appropriate  Affect:  Appropriate  Cognitive:  Appropriate  Insight:  Good  Engagement in Group:  None  Modes of Intervention:  Discussion and Orientation  Summary of Progress/Problems: No goal  Chase Picket 06/24/2022, 9:21 AM

## 2022-06-24 NOTE — Progress Notes (Signed)
Changed dressing to left hand- wound noted to be red/ swollen and painful. Some old drainage- serosanguineous- noted on old dressing. Wound cleansed with sterile normal saline solution, bacitracin ointment applied per MD order- wound covered with band aid.

## 2022-06-24 NOTE — [facility] Group Notes (Signed)
[facility] Group Notes:  (Nursing)  Date:  06/24/2022  Time:  400PM  Type of Therapy:  Nurse Education  Participation Level:  Active  Participation Quality:  Appropriate and Attentive  Affect:  Appropriate  Cognitive:  Alert and Appropriate  Insight:  Appropriate  Engagement in Group:  Engaged and Improving  Modes of Intervention:  Discussion, Exploration, Rapport Building, Socialization, and Support  Summary of Progress/Problems:  Benjamin [name] 06/24/2022, 7:04 PM

## 2022-06-24 NOTE — Progress Notes (Signed)
D. Pt presents with a sad affect/ depressed mood, calm, cooperative behavior- rated his depression,hopelessness and anxiety an 8/10/4, respectively. Pt's stated goal was to "work on feeling better about life by thinking more positive." Pt currently denies SI/HI and AVH , but did report that he occasionally has self harm thoughts. Pt agrees to contact staff before acting on any harmful thoughts.  A. Labs and vitals monitored. Pt given prn oxycodone for back pain rated a 7/10. Pt supported emotionally and encouraged to express concerns and ask questions.   R. Pt remains safe with 15 minute checks. Will continue POC.

## 2022-06-24 NOTE — Progress Notes (Signed)
[facility] MD Progress Note  06/24/2022 3:51 PM Benjamin [name]  MRN:  [id number]  Principal Problem: MDD (major depressive disorder), recurrent severe, without psychosis  Diagnosis: Principal Problem:   MDD (major depressive disorder), recurrent severe, without psychosis ([place]) Active Problems:   Essential hypertension, benign   Insomnia   Cocaine use disorder ([place])   Chronic pain syndrome   GAD (generalized anxiety disorder)  Reason for admission: Patient reports that "Last Saturday, I blacked out.", but then goes on to recount the series of events that happened during the time of his black out, as quoted above under "Chief compliant", and states that he remembers the details.  He states that Xanax which was a new prescription which he had taken for a month prior to the events above, triggered the episode that he had leading up to this hospitalization.  He admits to feeling suicidal during that time, states that he was on "a suicidal mission", reports that he made a cut to his left hand with a piece of glass that he found, and is observed to have a deep cut to his left thumb area.  Orders placed for bacitracin ointment, and for nursing to cleanse the area with normal saline solution, apply bacitracin ointment to area, prior to covering with a nonadherent dressing or Band-Aid. He states that a tetanus vaccine was administered to him in the ER.   24-hour chart Review: Past 24 hours of patient's chart was reviewed.  Patient is compliant with scheduled meds. Required Agitation PRNs: None Per RN notes, no documented behavioral issues and is attending group. He continues to require/receive pain management.  Daily notes: Pt continues to present today with a depressed mood, he endorses SI, verbally contracts for safety on the unit, states that if discharged today, he will cut himself in hopes to die.  He reports having urges to cut himself, but verbally contracting for safety on the unit.  He reports  feeling hopeless, helpless, and tired, states that he feels like giving up because he is supposed to be going to rehab at a place called "[facility]" in [place], [place], but feels as though he will not be able to participate in activities there due to his physical limitations. He states that he is not able to walk well, but that program requires hiking and other activities. Empathy and active listening provided, pt encouraged to speak with his wife regarding this, and jointly make a decision with him regarding other programs he can go to. He denies HI/AVH or paranoia. There is no evidence of delusional thoughts.    Pt reports that his sleep quality has been very poor, he reports a good appetite, reports pain of between 7-8 all the time, states that the pain is all over his body, but states that the Hydrocodone is helpful for a couple of hours when he takes it. He denies trouble moving his bowels, reports being open to medication adjustments; We discussed starting a low dose Remeron 7.5 mg nightly for sleep, discussed increased Wellbutrin to 300 mg starting tomorrow morning for mood stabilization, and discussed increasing Avapro to 300 mg daily and Norvasc to 10 mg daily for htn. We are continuing other medications as listed below.   Total Time spent with patient: 30 minutes  Past Psychiatric History:  Previous Psych Diagnoses: MDD, polysubstance abuse Prior inpatient treatment: [facility] admissions on 08/13/15, 08/24/15, 11/13/17 & 11/26/17. Current/prior outpatient treatment: "Benjamin [name]" Prior rehab [id number]  Psychotherapy [id number] having one currently  History of suicide attempts:  Multiple via cutting since childhood as per patient and via sitting on railroad tracks while living in [place]   Past Medical History:  Past Medical History:  Diagnosis Date   Chronic back pain    managed by [facility]   CKD (chronic kidney disease), stage III ([place]) 10/2017   Erectile dysfunction    History of substance  abuse ([place])    Hx of suicide attempt    Hypertension     Past Surgical History:  Procedure Laterality Date   ANKLE SURGERY     right   APPLICATION OF WOUND VAC Right 08/17/2015   Procedure: APPLICATION OF WOUND VAC;  Surgeon: Roseanne Kaufman, MD;  Location: [place];  Service: Orthopedics;  Laterality: Right;   HAND SURGERY     fracture, ORIG, teenage years, right.   I & D EXTREMITY Right 08/10/2015   Procedure: IRRIGATION AND DEBRIDEMENT SKIN, SUBCUTANEOS TISSUE AND  MUSCLE, CARPAL TUNNEL RELEASE, MEDIAN NERVE LYSIS WITH NERVE WRAP;  Surgeon: Roseanne Kaufman, MD;  Location: [place];  Service: Orthopedics;  Laterality: Right;   INCISION AND DRAINAGE OF WOUND Right 08/15/2015   Procedure: IRRIGATION AND DEBRIDEMENT WOUND;  Surgeon: Justice Britain, MD;  Location: [facility];  Service: Orthopedics;  Laterality: Right;   LUMBAR EPIDURAL INJECTION     [facility]   SKIN SPLIT GRAFT Right 08/17/2015   Procedure: I&D RIGHT FOREARM/POSSIBLE SKIN GRAFT;  Surgeon: Roseanne Kaufman, MD;  Location: [place];  Service: Orthopedics;  Laterality: Right;   TENDON REPAIR N/A 07/24/2015   Procedure: WRIST LACERATION AND TENDON REPAIR;  Surgeon: Iran Planas, MD;  Location: [place];  Service: Orthopedics;  Laterality: N/A;   WOUND EXPLORATION Right 08/15/2015   Procedure: WOUND EXPLORATION right forearm;  Surgeon: Justice Britain, MD;  Location: [facility];  Service: Orthopedics;  Laterality: Right;   Family History:  Family History  Problem Relation Age of Onset   Cancer Mother        lung, smoker   Anxiety disorder Mother    Cancer Father        throat   Diabetes Paternal Grandmother    Cancer Paternal Aunt    Colon cancer Paternal Aunt    Heart disease Neg Hx    Stroke Neg Hx    Esophageal cancer Neg Hx    Inflammatory bowel disease Neg Hx    Liver disease Neg Hx    Pancreatic cancer Neg Hx    Rectal cancer Neg Hx    Stomach cancer Neg Hx    Family Psychiatric  History:  Mother with MDD, attempted suicide  multiple times, didn't succeed, but, but died of cancer. Psych [id number] SA/HA:mother, but didn't complete Substance use family [id number]   Social History:  Social History   Substance and Sexual Activity  Alcohol Use Yes   Alcohol/week: 0.0 standard drinks of alcohol   Comment: occasional     Social History   Substance and Sexual Activity  Drug Use Yes   Types: Marijuana, Cocaine, Heroin   Comment: last use cocaine 08/08/15, last use MJ 08/08/15    Social History   Socioeconomic History   Marital status: Single    Spouse name: Not on file   Number of children: 0   Years of education: Not on file   Highest education level: Not on file  Occupational History   Occupation: unemployed  Tobacco Use   Smoking status: Former    Packs/day: 0.50    Years: 6.00  Additional pack years: 0.00    Total pack years: 3.00    Types: Cigarettes   Smokeless tobacco: Never  Vaping Use   Vaping Use: Never used  Substance and Sexual Activity   Alcohol use: Yes    Alcohol/week: 0.0 standard drinks of alcohol    Comment: occasional   Drug use: Yes    Types: Marijuana, Cocaine, Heroin    Comment: last use cocaine 08/08/15, last use MJ 08/08/15   Sexual activity: Yes  Other Topics Concern   Not on file  Social History Narrative   Single,prior worked as Teacher, 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   No children.  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   09/2017.    Social Determinants of Health   Financial Resource Strain: Not on file  Food Insecurity: Not on file  Transportation Needs: Not on file  Physical Activity: Not on file  Stress: Not on file  Social Connections: Not on file   Additional Social History:    Sleep: Fair  Appetite:  Good  Current Medications: Current Facility-Administered Medications  Medication Dose Route Frequency Provider Last Rate Last Admin   acetaminophen (TYLENOL) tablet 650 mg  650 mg Oral Q6H PRN Vesta Mixer, NP   650 mg at  06/21/22 0254   alum & mag hydroxide-simeth (MAALOX/MYLANTA) 200-200-20 MG/5ML suspension 30 mL  30 mL Oral Q4H PRN Vesta Mixer, NP       Derrill Memo ON 06/25/2022] amLODipine (NORVASC) tablet 10 mg  10 mg Oral Daily Nicholes Rough, NP       And   [START ON 06/25/2022] irbesartan (AVAPRO) tablet 300 mg  300 mg Oral Daily Nicholes Rough, NP       bacitracin ointment   Topical BID Janine Limbo, MD   [id number] Application at [id number]   [START ON 06/25/2022] buPROPion (WELLBUTRIN XL) 24 hr tablet 300 mg  300 mg Oral Daily Jadarrius Maselli, Tamela Oddi, NP       cephALEXin (KEFLEX) capsule 500 mg  500 mg Oral Q12H Nwoko, Agnes I, NP   500 mg at 06/24/22 0825   haloperidol (HALDOL) tablet 5 mg  5 mg Oral TID PRN Vesta Mixer, NP       Or   haloperidol lactate (HALDOL) injection 5 mg  5 mg Intramuscular TID PRN Vesta Mixer, NP       HYDROcodone-acetaminophen (NORCO) 10-325 MG per tablet 1 tablet  1 tablet Oral TID PRN Ranae Palms, MD   1 tablet at 06/24/22 1241   hydrOXYzine (ATARAX) tablet 25 mg  25 mg Oral TID PRN Vesta Mixer, NP   25 mg at 06/22/22 1956   LORazepam (ATIVAN) injection 2 mg  2 mg Intramuscular TID PRN Vesta Mixer, NP       magnesium hydroxide (MILK OF MAGNESIA) suspension 30 mL  30 mL Oral Daily PRN Vesta Mixer, NP       melatonin tablet 3 mg  3 mg Oral QHS Keasia Dubose, NP   3 mg at 06/23/22 2147   mirtazapine (REMERON) tablet 7.5 mg  7.5 mg Oral QHS Kymir Coles, Tamela Oddi, NP       multivitamin with minerals tablet 1 tablet  1 tablet Oral Daily Nicholes Rough, NP   1 tablet at 06/24/22 0825   rosuvastatin (CRESTOR) tablet 10 mg  10 mg Oral Daily Nicholes Rough, NP   10 mg at 06/24/22 0825   thiamine (Vitamin B-1) tablet 100 mg  100 mg Oral Daily Nicholes Rough, NP   100 mg  at 06/24/22 0825   thiamine (VITAMIN B1) injection 100 mg  100 mg Intramuscular Once Nicholes Rough, NP        Lab Results:  No results found for this or any previous visit (from the past 48  hour(s)).  Blood Alcohol level:  Lab Results  Component Value Date   ETH <10 06/20/2022   ETH <10 123456   Metabolic Disorder Labs: Lab Results  Component Value Date   HGBA1C 5.6 08/31/2016   MPG 114 08/31/2016   MPG 120 (H) 05/24/2015   No results found for: "PROLACTIN" Lab Results  Component Value Date   CHOL 249 (H) 03/04/2018   TRIG 137 03/04/2018   HDL 52 03/04/2018   CHOLHDL 4.8 03/04/2018   VLDL 33 (H) 08/31/2016   LDLCALC 170 (H) 03/04/2018   LDLCALC 159 (H) 10/08/2017   Physical Findings: AIMS:  , ,  ,  ,    CIWA:  CIWA-Ar Total: 1 COWS:     Musculoskeletal: Strength & Muscle Tone: within normal limits Gait & Station: unsteady Patient leans:  Walks with a rolling walker  Psychiatric Specialty Exam:  Presentation  General Appearance:  Appropriate for Environment; Fairly Groomed  Eye Contact: Fair  Speech: Clear and Coherent  Speech Volume: Normal  Handedness: Right  Mood and Affect  Mood: Depressed; Anxious  Affect: Congruent  Thought Process  Thought Processes: Coherent  Descriptions of Associations:Intact  Orientation:Full (Time, Place and Person)  Thought Content:Logical  History of Schizophrenia/Schizoaffective disorder:No data recorded Duration of Psychotic Symptoms:No data recorded Hallucinations:Hallucinations: None   Ideas of Reference:None  Suicidal Thoughts:Suicidal Thoughts: Yes, Active SI Active Intent and/or Plan: Without Intent; Without Plan   Homicidal Thoughts:Homicidal Thoughts: No   Sensorium  Memory: Immediate Good  Judgment: Fair  Insight: Fair  Community education officer  Concentration: Good  Attention Span: Good  Recall: Good  Fund of Knowledge: Good  Language: Good  Psychomotor Activity  Psychomotor Activity: Psychomotor Activity: Normal   Assets  Assets: Communication Skills  Sleep  Sleep: Sleep: Poor   Physical Exam: Physical Exam Vitals and nursing note  reviewed.  HENT:     Head: Normocephalic.     Nose: Nose normal.     Mouth/Throat:     Mouth: Mucous membranes are moist.     Pharynx: Oropharynx is clear.  Eyes:     Conjunctiva/sclera: Conjunctivae normal.     Pupils: Pupils are equal, round, and reactive to light.  Cardiovascular:     Rate and Rhythm: Normal rate.     Pulses: Normal pulses.     Comments: Blood pressure 143/100 pulse 69.  Patient asymptomatic.  Nursing staff to recheck vital signs. Abdominal:     Palpations: Abdomen is soft.  Genitourinary:    Comments: Deferred Musculoskeletal:        General: Normal range of motion.     Cervical back: Normal range of motion.  Skin:    General: Skin is warm.  Neurological:     General: No focal deficit present.     Mental Status: He is alert and oriented to person, place, and time.  Psychiatric:        Behavior: Behavior normal.    Review of Systems  Constitutional: Negative.   HENT: Negative.    Eyes: Negative.   Respiratory: Negative.    Cardiovascular: Negative.   Gastrointestinal: Negative.   Genitourinary: Negative.   Musculoskeletal: Negative.   Skin: Negative.   Neurological: Negative.   Endo/Heme/Allergies: Negative.   Psychiatric/Behavioral:  Positive  for depression, substance abuse and suicidal ideas. The patient is nervous/anxious and has insomnia.    Blood pressure (!) 146/83, pulse 65, temperature 98.5 F (36.9 C), temperature source Oral, resp. rate 16, height 6\' 2"  (1.88 m), weight 101.1 kg, SpO2 100 %. Body mass index is 28.61 kg/m.  Treatment Plan Summary: Diagnoses:  Principal Problem:   MDD (major depressive disorder), recurrent severe, without psychosis ([place]) Active Problems:   Essential hypertension, benign   Insomnia   Cocaine use disorder ([facility])   Chronic pain syndrome   GAD (generalized anxiety disorder)   Medications -Start Remeron 7.5 mg nightly for sleep -Increase Wellbutrin XR to 300  mg daily for depressive symptoms (Home  med) -Continue Norco 1tab Q 8 H PRN for pain (home med) -Increase Avapro to 300 mg/Norvasc 10mg   daily for htn (home med) -Continue Hydroxyzine 25 mg TID PRN for anxiety -Continue Melatonin 3 mg nightly for sleep -Continue Crestor 10 mg daily for hyperlipidemia (home med) -Continue Ativan 1mg  Q6H PRN for CIWA scores >10 -Continue agitation protocol Zyprexa/Ativan/Geodon PRN.  -Continue Keflex 500 mg po bid x 7 days for wound infection.   PRNS -Continue Tylenol 650 mg every 6 hours PRN for mild pain -Continue Maalox 30 mg every 4 hrs PRN for indigestion -Continue Imodium 2-4 mg as needed for diarrhea -Continue Milk of Magnesia as needed every 6 hrs for constipation -Continue Zofran disintegrating tabs every 6 hrs PRN for nausea    Discharge Planning: Social work and case management to assist with discharge planning and identification of hospital follow-up needs prior to discharge Estimated LOS: 5-7 days Discharge Concerns: Need to establish a safety plan; Medication compliance and effectiveness Discharge Goals: Return home with outpatient referrals for mental health follow-up including medication management/psychotherapy  Safety and Monitoring: Voluntary admission to inpatient psychiatric unit for safety, stabilization and treatment Daily contact with patient to assess and evaluate symptoms and progress in treatment Patient's case to be discussed in multi-disciplinary team meeting Observation Level : q15 minute checks Vital signs: q12 hours Precautions: Safety   I certify that inpatient services furnished can reasonably be expected to improve the patient's condition.     Nicholes Rough, NP, pmhnp, fnp-bc. 06/24/2022, 3:51 PMPatient ID: Benjamin [name], male   DOB: 25-Sep-1965, 57 y.o.   MRN: [id number] Patient ID: Benjamin [name], male   DOB: 1965/04/26, 57 y.o.   MRN: [id number]

## 2022-06-25 DIAGNOSIS — F332 Major depressive disorder, recurrent severe without psychotic features: Secondary | ICD-10-CM | POA: Diagnosis not present

## 2022-06-25 NOTE — Group Note (Signed)
Date:  06/25/2022 Time:  5:10 PM  Group Topic/Focus:  Spirituality:   The focus of this group is to discuss how one's spirituality can aide in recovery.    Participation Level:  Active  Participation Quality:  Appropriate  Affect:  Appropriate  Cognitive:  Appropriate  Insight: Appropriate  Engagement in Group:  Engaged  Modes of Intervention:  Exploration  Additional Comments:     [name] 06/25/2022, 5:10 PM

## 2022-06-25 NOTE — Progress Notes (Signed)
   06/24/22 2300  Psych Admission Type (Psych Patients Only)  Admission Status Voluntary  Psychosocial Assessment  Patient Complaints Isolation  Eye Contact Fair  Facial Expression Anxious  Affect Appropriate to circumstance  Speech Logical/coherent  Interaction Assertive  Motor Activity Slow  Appearance/Hygiene Unremarkable  Behavior Characteristics Cooperative;Appropriate to situation  Mood Depressed  Thought Process  Coherency WDL  Content WDL  Delusions None reported or observed  Perception WDL  Hallucination None reported or observed  Judgment Poor  Confusion None  Danger to Self  Current suicidal ideation? Denies  Agreement Not to Harm Self Yes  Description of Agreement verbal  Danger to Others  Danger to Others None reported or observed

## 2022-06-25 NOTE — Progress Notes (Signed)
[facility] MD Progress Note  06/25/2022 12:13 PM Benjamin [name]  MRN:  [id number]  Principal Problem: MDD (major depressive disorder), recurrent severe, without psychosis  Diagnosis: Principal Problem:   MDD (major depressive disorder), recurrent severe, without psychosis ([place]) Active Problems:   Essential hypertension, benign   Insomnia   Cocaine use disorder ([place])   Chronic pain syndrome   GAD (generalized anxiety disorder)  Reason for admission: Patient reports that "Last Saturday, I blacked out.", but then goes on to recount the series of events that happened during the time of his black out, as quoted above under "Chief compliant", and states that he remembers the details.  He states that Xanax which was a new prescription which he had taken for a month prior to the events above, triggered the episode that he had leading up to this hospitalization.  He admits to feeling suicidal during that time, states that he was on "a suicidal mission", reports that he made a cut to his left hand with a piece of glass that he found, and is observed to have a deep cut to his left thumb area.  Orders placed for bacitracin ointment, and for nursing to cleanse the area with normal saline solution, apply bacitracin ointment to area, prior to covering with a nonadherent dressing or Band-Aid. He states that a tetanus vaccine was administered to him in the ER.   24-hour chart Review: Past 24 hours of patient's chart was reviewed.  Patient is compliant with scheduled meds. Required Agitation PRNs: Hydrocodone and hydroxyzine Per RN notes, no documented behavioral issues and is attending some group sessions. He continues to require/receive pain management.  Daily notes: Mood today remains depressed, affect is congruent.  Patient presents today with passive SI, states that he does not care if he goes to sleep and never wakes up ever.  He is not able to identify the reasons for his continuos existence, unable to  verbally contract for safety at this time outside of this hospital, but able to contract for safety inside of this hospital.  He states that he would not do anything to harm himself while here.  He denies HI/AVH, denies paranoia and there is no evidence of delusional thinking.  He is taking care of personal hygiene and grooming, ambulating in the hallways without assistance, has a walker that has been provided to him to help with ambulation due to complaints of chronic back pain.  Patient rates his pain today as a 7, 10 being worst, rates anxiety as a 4, 10 being worse, and rates depression as a 9, 10 being worst.  He reports that he was able to talk to his wife about the outpatient program that he intends to go to after discharge, but remains apprehensive about the program due to his physical limitations rendering him unable to participate in several of the programs.  Patient states that he will reach out to that program tomorrow to inquire if programming can be modified for him.  He is continuing to tolerate all medications without any side effects, Wellbutrin was increased to 300 mg starting today, Remeron 7.5 mg was added last night for sleep, patient reports that this was effective as he was able to sleep more.  We will continue current medications at current doses with no adjustments being made today. BP meds were also increased yesterday as below. We will continue tom monitor BP. Mood remains very depressed, and inpatient hospitalization continues to be necessary at this time to treat and stabilize symptoms  enough for management on an outpatient basis.  Total Time spent with patient: 45 minutes  Past Psychiatric History:  Previous Psych Diagnoses: MDD, polysubstance abuse Prior inpatient treatment: [facility] admissions on 08/13/15, 08/24/15, 11/13/17 & 11/26/17. Current/prior outpatient treatment: "Dr Benjamin [name]" Prior rehab [id number]  Psychotherapy [id number] having one currently  History of suicide attempts:  Multiple via cutting since childhood as per patient and via sitting on railroad tracks while living in [place]   Past Medical History:  Past Medical History:  Diagnosis Date   Chronic back pain    managed by [facility]   CKD (chronic kidney disease), stage III ([place]) 10/2017   Erectile dysfunction    History of substance abuse ([place])    Hx of suicide attempt    Hypertension     Past Surgical History:  Procedure Laterality Date   ANKLE SURGERY     right   APPLICATION OF WOUND VAC Right 08/17/2015   Procedure: APPLICATION OF WOUND VAC;  Surgeon: Roseanne Kaufman, MD;  Location: [place];  Service: Orthopedics;  Laterality: Right;   HAND SURGERY     fracture, ORIG, teenage years, right.   I & D EXTREMITY Right 08/10/2015   Procedure: IRRIGATION AND DEBRIDEMENT SKIN, SUBCUTANEOS TISSUE AND  MUSCLE, CARPAL TUNNEL RELEASE, MEDIAN NERVE LYSIS WITH NERVE WRAP;  Surgeon: Roseanne Kaufman, MD;  Location: [place];  Service: Orthopedics;  Laterality: Right;   INCISION AND DRAINAGE OF WOUND Right 08/15/2015   Procedure: IRRIGATION AND DEBRIDEMENT WOUND;  Surgeon: Justice Britain, MD;  Location: [facility];  Service: Orthopedics;  Laterality: Right;   LUMBAR EPIDURAL INJECTION     [facility]   SKIN SPLIT GRAFT Right 08/17/2015   Procedure: I&D RIGHT FOREARM/POSSIBLE SKIN GRAFT;  Surgeon: Roseanne Kaufman, MD;  Location: [place];  Service: Orthopedics;  Laterality: Right;   TENDON REPAIR N/A 07/24/2015   Procedure: WRIST LACERATION AND TENDON REPAIR;  Surgeon: Iran Planas, MD;  Location: [place];  Service: Orthopedics;  Laterality: N/A;   WOUND EXPLORATION Right 08/15/2015   Procedure: WOUND EXPLORATION right forearm;  Surgeon: Justice Britain, MD;  Location: [facility];  Service: Orthopedics;  Laterality: Right;   Family History:  Family History  Problem Relation Age of Onset   Cancer Mother        lung, smoker   Anxiety disorder Mother    Cancer Father        throat   Diabetes Paternal  Grandmother    Cancer Paternal Aunt    Colon cancer Paternal Aunt    Heart disease Neg Hx    Stroke Neg Hx    Esophageal cancer Neg Hx    Inflammatory bowel disease Neg Hx    Liver disease Neg Hx    Pancreatic cancer Neg Hx    Rectal cancer Neg Hx    Stomach cancer Neg Hx    Family Psychiatric  History:  Mother with MDD, attempted suicide multiple times, didn't succeed, but, but died of cancer. Psych [id number] SA/HA:mother, but didn't complete Substance use family [id number]   Social History:  Social History   Substance and Sexual Activity  Alcohol Use Yes   Alcohol/week: 0.0 standard drinks of alcohol   Comment: occasional     Social History   Substance and Sexual Activity  Drug Use Yes   Types: Marijuana, Cocaine, Heroin   Comment: last use cocaine 08/08/15, last use MJ 08/08/15    Social History   Socioeconomic History   Marital status: Single  Spouse name: Not on file   Number of children: 0   Years of education: Not on file   Highest education level: Not on file  Occupational History   Occupation: unemployed  Tobacco Use   Smoking status: Former    Packs/day: 0.50    Years: 6.00    Additional pack years: 0.00    Total pack years: 3.00    Types: Cigarettes   Smokeless tobacco: Never  Vaping Use   Vaping Use: Never used  Substance and Sexual Activity   Alcohol use: Yes    Alcohol/week: 0.0 standard drinks of alcohol    Comment: occasional   Drug use: Yes    Types: Marijuana, Cocaine, Heroin    Comment: last use cocaine 08/08/15, last use MJ 08/08/15   Sexual activity: Yes  Other Topics Concern   Not on file  Social History Narrative   Single,prior worked as Teacher, 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   No children.  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   09/2017.    Social Determinants of Health   Financial Resource Strain: Not on file  Food Insecurity: Not on file  Transportation Needs: Not on file  Physical Activity: Not on  file  Stress: Not on file  Social Connections: Not on file   Additional Social History:    Sleep: Fair  Appetite:  Good  Current Medications: Current Facility-Administered Medications  Medication Dose Route Frequency Provider Last Rate Last Admin   acetaminophen (TYLENOL) tablet 650 mg  650 mg Oral Q6H PRN Vesta Mixer, NP   650 mg at 06/21/22 0254   alum & mag hydroxide-simeth (MAALOX/MYLANTA) 200-200-20 MG/5ML suspension 30 mL  30 mL Oral Q4H PRN Vesta Mixer, NP       amLODipine (NORVASC) tablet 10 mg  10 mg Oral Daily Nicholes Rough, NP   10 mg at 06/25/22 [id number]   And   irbesartan (AVAPRO) tablet 300 mg  300 mg Oral Daily Nicholes Rough, NP   300 mg at 06/25/22 0755   bacitracin ointment   Topical BID Janine Limbo, MD   [id number] Application at [id number]   buPROPion (WELLBUTRIN XL) 24 hr tablet 300 mg  300 mg Oral Daily Nicholes Rough, NP   300 mg at 06/25/22 0755   cephALEXin (KEFLEX) capsule 500 mg  500 mg Oral Q12H Lindell Spar I, NP   500 mg at 06/25/22 0755   haloperidol (HALDOL) tablet 5 mg  5 mg Oral TID PRN Vesta Mixer, NP       Or   haloperidol lactate (HALDOL) injection 5 mg  5 mg Intramuscular TID PRN Vesta Mixer, NP       HYDROcodone-acetaminophen (NORCO) 10-325 MG per tablet 1 tablet  1 tablet Oral TID PRN Ranae Palms, MD   1 tablet at 06/25/22 0612   hydrOXYzine (ATARAX) tablet 25 mg  25 mg Oral TID PRN Vesta Mixer, NP   25 mg at 06/22/22 1956   LORazepam (ATIVAN) injection 2 mg  2 mg Intramuscular TID PRN Vesta Mixer, NP       magnesium hydroxide (MILK OF MAGNESIA) suspension 30 mL  30 mL Oral Daily PRN Vesta Mixer, NP       melatonin tablet 3 mg  3 mg Oral QHS Lakeith Careaga, NP   3 mg at 06/24/22 2105   mirtazapine (REMERON) tablet 7.5 mg  7.5 mg Oral QHS Omelia Marquart, NP   7.5 mg at 06/24/22 2105   multivitamin with minerals tablet 1  tablet  1 tablet Oral Daily Nicholes Rough, NP   1 tablet at 06/25/22 0755    rosuvastatin (CRESTOR) tablet 10 mg  10 mg Oral Daily Nicholes Rough, NP   10 mg at 06/25/22 0756   thiamine (Vitamin B-1) tablet 100 mg  100 mg Oral Daily Nicholes Rough, NP   100 mg at 06/25/22 0756   thiamine (VITAMIN B1) injection 100 mg  100 mg Intramuscular Once Nicholes Rough, NP        Lab Results:  No results found for this or any previous visit (from the past 59 hour(s)).  Blood Alcohol level:  Lab Results  Component Value Date   ETH <10 06/20/2022   ETH <10 [id number]   Metabolic Disorder Labs: Lab Results  Component Value Date   HGBA1C 5.6 08/31/2016   MPG 114 08/31/2016   MPG 120 (H) 05/24/2015   No results found for: "PROLACTIN" Lab Results  Component Value Date   CHOL 249 (H) 03/04/2018   TRIG 137 03/04/2018   HDL 52 03/04/2018   CHOLHDL 4.8 03/04/2018   VLDL 33 (H) 08/31/2016   LDLCALC 170 (H) 03/04/2018   LDLCALC 159 (H) 10/08/2017   Physical Findings: AIMS:  , ,  ,  ,    CIWA:  CIWA-Ar Total: 1 COWS:     Musculoskeletal: Strength & Muscle Tone: within normal limits Gait & Station: unsteady Patient leans:  Walks with a rolling walker  Psychiatric Specialty Exam:  Presentation  General Appearance:  Appropriate for Environment; Fairly Groomed  Eye Contact: Good  Speech: Clear and Coherent  Speech Volume: Normal  Handedness: Right  Mood and Affect  Mood: Depressed; Anxious  Affect: Congruent  Thought Process  Thought Processes: Coherent  Descriptions of Associations:Intact  Orientation:Full (Time, Place and Person)  Thought Content:Logical  History of Schizophrenia/Schizoaffective disorder:No data recorded Duration of Psychotic Symptoms:No data recorded Hallucinations:Hallucinations: None   Ideas of Reference:None  Suicidal Thoughts:Suicidal Thoughts: Yes, Passive SI Active Intent and/or Plan: Without Intent; Without Plan   Homicidal Thoughts:Homicidal Thoughts: No   Sensorium  Memory: Immediate  Good  Judgment: Fair  Insight: Fair  Community education officer  Concentration: Good  Attention Span: Good  Recall: Good  Fund of Knowledge: Good  Language: Good  Psychomotor Activity  Psychomotor Activity: Psychomotor Activity: Normal   Assets  Assets: Communication Skills  Sleep  Sleep: Sleep: Good   Physical Exam: Physical Exam Vitals and nursing note reviewed.  HENT:     Head: Normocephalic.     Nose: Nose normal.     Mouth/Throat:     Mouth: Mucous membranes are moist.     Pharynx: Oropharynx is clear.  Eyes:     Conjunctiva/sclera: Conjunctivae normal.     Pupils: Pupils are equal, round, and reactive to light.  Cardiovascular:     Rate and Rhythm: Normal rate.     Pulses: Normal pulses.     Comments: Blood pressure 143/100 pulse 69.  Patient asymptomatic.  Nursing staff to recheck vital signs. Abdominal:     Palpations: Abdomen is soft.  Genitourinary:    Comments: Deferred Musculoskeletal:        General: Normal range of motion.     Cervical back: Normal range of motion.  Skin:    General: Skin is warm.  Neurological:     General: No focal deficit present.     Mental Status: He is alert and oriented to person, place, and time.  Psychiatric:        Behavior:  Behavior normal.    Review of Systems  Constitutional: Negative.   HENT: Negative.    Eyes: Negative.   Respiratory: Negative.    Cardiovascular: Negative.   Gastrointestinal: Negative.   Genitourinary: Negative.   Musculoskeletal: Negative.   Skin: Negative.   Neurological: Negative.   Endo/Heme/Allergies: Negative.   Psychiatric/Behavioral:  Positive for depression, substance abuse and suicidal ideas. Negative for hallucinations. The patient is nervous/anxious and has insomnia.    Blood pressure 126/89, pulse 74, temperature 98.2 F (36.8 C), temperature source Oral, resp. rate 20, height 6\' 2"  (1.88 m), weight 101.1 kg, SpO2 98 %. Body mass index is 28.61 kg/m.  Treatment  Plan Summary: Diagnoses:  Principal Problem:   MDD (major depressive disorder), recurrent severe, without psychosis ([place]) Active Problems:   Essential hypertension, benign   Insomnia   Cocaine use disorder ([facility])   Chronic pain syndrome   GAD (generalized anxiety disorder)   Medications -Continue Remeron 7.5 mg nightly for sleep -Continue Wellbutrin XR to 300  mg daily for depressive symptoms (Home med) -Continue Norco 1tab Q 8 H PRN for pain (home med) -Continue Avapro 300 mg/Norvasc 10mg   daily for htn (home med) -Continue Hydroxyzine 25 mg TID PRN for anxiety -Continue Melatonin 3 mg nightly for sleep -Continue Crestor 10 mg daily for hyperlipidemia (home med) -Continue Ativan 1mg  Q6H PRN for CIWA scores >10 -Continue agitation protocol Zyprexa/Ativan/Geodon PRN.  -Continue Keflex 500 mg po bid x 7 days for wound infection.   PRNS -Continue Tylenol 650 mg every 6 hours PRN for mild pain -Continue Maalox 30 mg every 4 hrs PRN for indigestion -Continue Imodium 2-4 mg as needed for diarrhea -Continue Milk of Magnesia as needed every 6 hrs for constipation -Continue Zofran disintegrating tabs every 6 hrs PRN for nausea    Discharge Planning: Social work and case management to assist with discharge planning and identification of hospital follow-up needs prior to discharge Estimated LOS: 5-7 days Discharge Concerns: Need to establish a safety plan; Medication compliance and effectiveness Discharge Goals: Return home with outpatient referrals for mental health follow-up including medication management/psychotherapy  Safety and Monitoring: Voluntary admission to inpatient psychiatric unit for safety, stabilization and treatment Daily contact with patient to assess and evaluate symptoms and progress in treatment Patient's case to be discussed in multi-disciplinary team meeting Observation Level : q15 minute checks Vital signs: q12 hours Precautions: Safety   I certify that  inpatient services furnished can reasonably be expected to improve the patient's condition.     Nicholes Rough, NP, pmhnp. 06/25/2022, 12:13 PMPatient ID: Benjamin [name], male   DOB: Feb 16, 1966, 57 y.o. , male   DOB: Aug 09, 1965, 57 y.o.   MRN: [id number]

## 2022-06-25 NOTE — Progress Notes (Signed)
Adult Psychoeducational Group Note  Date:  06/25/2022 Time:  9:09 PM  Group Topic/Focus:  Wrap-Up Group:   The focus of this group is to help patients review their daily goal of treatment and discuss progress on daily workbooks.  Participation Level:  Active  Participation Quality:  Appropriate  Affect:  Appropriate  Cognitive:  Appropriate  Insight: Improving  Engagement in Group:  Engaged  Modes of Intervention:  Education  Additional Comments:  Pt attended the evening wrap-up group. Tech introduced the staff for the evening, reminded group of the evening schedule and reminded them to ask for anything they need. PT introduced themself to the group and shared a coping skill they use or have learned.  Amie Critchley 06/25/2022, 9:09 PM

## 2022-06-25 NOTE — Progress Notes (Signed)
D. Pt continues to present with a sad affect/ depressed mood. Patient compliant with scheduled meds.  Pt requested prn oxycodone twice for chronic back pain rated 7/10. Pt endorses passive SI- no plan, and agrees to contact staff before acting on any harmful thoughts. Pt denies A/VH and does not appear to be responding to internal stimuli.  A. Labs and vitals monitored. Pt given and educated on medications. Pt supported emotionally and encouraged to express concerns and ask questions.   R. Pt remains safe with 15 minute checks. Will continue POC.

## 2022-06-26 ENCOUNTER — Encounter (HOSPITAL_COMMUNITY): Payer: Self-pay

## 2022-06-26 DIAGNOSIS — F332 Major depressive disorder, recurrent severe without psychotic features: Secondary | ICD-10-CM | POA: Diagnosis not present

## 2022-06-26 MED ORDER — MELATONIN 3 MG PO TABS
6.0000 mg | ORAL_TABLET | Freq: Every day | ORAL | Status: DC
  Administered 2022-06-26 – 2022-06-28 (×3): 6 mg via ORAL
  Filled 2022-06-26 (×5): qty 2

## 2022-06-26 NOTE — Progress Notes (Signed)
[facility] MD Progress Note  06/26/2022 1:38 PM Benjamin [name]  MRN:  [id number]  Principal Problem: MDD (major depressive disorder), recurrent severe, without psychosis  Diagnosis: Principal Problem:   MDD (major depressive disorder), recurrent severe, without psychosis Active Problems:   Essential hypertension, benign   Insomnia   Cocaine use disorder   Chronic pain syndrome   GAD (generalized anxiety disorder)  Reason for admission: Patient reports that "Last Saturday, I blacked out.", but then goes on to recount the series of events that happened during the time of his black out, as quoted above under "Chief compliant", and states that he remembers the details.  He states that Xanax which was a new prescription which he had taken for a month prior to the events above, triggered the episode that he had leading up to this hospitalization.  He admits to feeling suicidal during that time, states that he was on "a suicidal mission", reports that he made a cut to his left hand with a piece of glass that he found, and is observed to have a deep cut to his left thumb area.  Orders placed for bacitracin ointment, and for nursing to cleanse the area with normal saline solution, apply bacitracin ointment to area, prior to covering with a nonadherent dressing or Band-Aid. He states that a tetanus vaccine was administered to him in the ER.   24-hour chart Review: Past 24 hours of patient's chart was reviewed.  Patient is compliant with scheduled meds. Required PRNs: Hydrocodone and hydroxyzine Per RN notes, no documented behavioral issues and is continuing to be in attendance of some group sessions.  Daily notes: Mood today remains depressed, affect is congruent.  Patient  continues to present today with passive SI, perseverate about not wanting to go to [facility] treatment program, states that he is physically not capable of working the program, and his wife will not understand this.  He states that he prefers at  this point, to go to a shelter, because his wife has stated that he cannot return home if he does not go to that program.  He verbalizes feelings of hopelessness, states that he feels overwhelmed, will not contract for safety on the outside of this hospital, and hopes that he can go to sleep and never wake up.  He contracts for safety, and states that he will not harm himself while hospitalized here at this hospital.  Writer asked if patient could discuss the option of intensive outpatient programming with his wife, and patient stated that he would like for social work to reach out to his wife regarding this. This information will be passed on to CSW.  Pt denies HI/AVH, denies paranoia and there is no evidence of delusional thinking. He states that his pain is better today and rates it as a 6-7, 10 being worst. He continues to reports that he is tolerating medications well, reports that his depressed mood is mostly related to the fact that he is being mandated by his wife to go to a program which he thinks he is not physically capable of going to. Pt reports that his appetite is improving and is currently fair. WE are increasing Melatonin to 6 mg nightly due to c/o trouble sleeping. We are continuing Wellbutrin at 300 mg daily and tentative discharge date is Wednesday 4/3.    Total Time spent with patient: 45 minutes  Past Psychiatric History:  Previous Psych Diagnoses: MDD, polysubstance abuse Prior inpatient treatment: [facility] admissions on 08/13/15, 08/24/15, 11/13/17 & 11/26/17.  Current/prior outpatient treatment: "Dr Sherrie Mustache" Prior rehab [id number]  Psychotherapy [id number] having one currently  History of suicide attempts: Multiple via cutting since childhood as per patient and via sitting on railroad tracks while living in [place]   Past Medical History:  Past Medical History:  Diagnosis Date   Chronic back pain    managed by [facility]   CKD (chronic kidney disease), stage III ([place]) 10/2017    Erectile dysfunction    History of substance abuse ([place])    Hx of suicide attempt    Hypertension     Past Surgical History:  Procedure Laterality Date   ANKLE SURGERY     right   APPLICATION OF WOUND VAC Right 08/17/2015   Procedure: APPLICATION OF WOUND VAC;  Surgeon: Roseanne Kaufman, MD;  Location: [place];  Service: Orthopedics;  Laterality: Right;   HAND SURGERY     fracture, ORIG, teenage years, right.   I & D EXTREMITY Right 08/10/2015   Procedure: IRRIGATION AND DEBRIDEMENT SKIN, SUBCUTANEOS TISSUE AND  MUSCLE, CARPAL TUNNEL RELEASE, MEDIAN NERVE LYSIS WITH NERVE WRAP;  Surgeon: Roseanne Kaufman, MD;  Location: [place];  Service: Orthopedics;  Laterality: Right;   INCISION AND DRAINAGE OF WOUND Right 08/15/2015   Procedure: IRRIGATION AND DEBRIDEMENT WOUND;  Surgeon: Justice Britain, MD;  Location: [facility];  Service: Orthopedics;  Laterality: Right;   LUMBAR EPIDURAL INJECTION     [facility]   SKIN SPLIT GRAFT Right 08/17/2015   Procedure: I&D RIGHT FOREARM/POSSIBLE SKIN GRAFT;  Surgeon: Roseanne Kaufman, MD;  Location: [place];  Service: Orthopedics;  Laterality: Right;   TENDON REPAIR N/A 07/24/2015   Procedure: WRIST LACERATION AND TENDON REPAIR;  Surgeon: Iran Planas, MD;  Location: [place];  Service: Orthopedics;  Laterality: N/A;   WOUND EXPLORATION Right 08/15/2015   Procedure: WOUND EXPLORATION right forearm;  Surgeon: Justice Britain, MD;  Location: [facility];  Service: Orthopedics;  Laterality: Right;   Family History:  Family History  Problem Relation Age of Onset   Cancer Mother        lung, smoker   Anxiety disorder Mother    Cancer Father        throat   Diabetes Paternal Grandmother    Cancer Paternal Aunt    Colon cancer Paternal Aunt    Heart disease Neg Hx    Stroke Neg Hx    Esophageal cancer Neg Hx    Inflammatory bowel disease Neg Hx    Liver disease Neg Hx    Pancreatic cancer Neg Hx    Rectal cancer Neg Hx    Stomach cancer Neg Hx    Family  Psychiatric  History:  Mother with MDD, attempted suicide multiple times, didn't succeed, but, but died of cancer. Psych [id number] SA/HA:mother, but didn't complete Substance use family [id number]   Social History:  Social History   Substance and Sexual Activity  Alcohol Use Yes   Alcohol/week: 0.0 standard drinks of alcohol   Comment: occasional     Social History   Substance and Sexual Activity  Drug Use Yes   Types: Marijuana, Cocaine, Heroin   Comment: last use cocaine 08/08/15, last use MJ 08/08/15    Social History   Socioeconomic History   Marital status: Single    Spouse name: Not on file   Number of children: 0   Years of education: Not on file   Highest education level: Not on file  Occupational History   Occupation: unemployed  Tobacco Use   Smoking status: Former    Packs/day: 0.50    Years: 6.00    Additional pack years: 0.00    Total pack years: 3.00    Types: Cigarettes   Smokeless tobacco: Never  Vaping Use   Vaping Use: Never used  Substance and Sexual Activity   Alcohol use: Yes    Alcohol/week: 0.0 standard drinks of alcohol    Comment: occasional   Drug use: Yes    Types: Marijuana, Cocaine, Heroin    Comment: last use cocaine 08/08/15, last use MJ 08/08/15   Sexual activity: Yes  Other Topics Concern   Not on file  Social History Narrative   Single,prior worked as Teacher, 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   No children.  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   09/2017.    Social Determinants of Health   Financial Resource Strain: Not on file  Food Insecurity: Not on file  Transportation Needs: Not on file  Physical Activity: Not on file  Stress: Not on file  Social Connections: Not on file   Additional Social History:    Sleep: Fair  Appetite:  Good  Current Medications: Current Facility-Administered Medications  Medication Dose Route Frequency Provider Last Rate Last Admin   acetaminophen (TYLENOL) tablet  650 mg  650 mg Oral Q6H PRN Vesta Mixer, NP   650 mg at 06/21/22 0254   alum & mag hydroxide-simeth (MAALOX/MYLANTA) 200-200-20 MG/5ML suspension 30 mL  30 mL Oral Q4H PRN Vesta Mixer, NP       amLODipine (NORVASC) tablet 10 mg  10 mg Oral Daily Nicholes Rough, NP   10 mg at 06/26/22 [id number]   And   irbesartan (AVAPRO) tablet 300 mg  300 mg Oral Daily Nicholes Rough, NP   300 mg at 06/26/22 0753   bacitracin ointment   Topical BID Janine Limbo, MD   [id number] Application at [id number]   buPROPion (WELLBUTRIN XL) 24 hr tablet 300 mg  300 mg Oral Daily Nicholes Rough, NP   300 mg at 06/26/22 0754   cephALEXin (KEFLEX) capsule 500 mg  500 mg Oral Q12H Lindell Spar I, NP   500 mg at 06/26/22 0754   haloperidol (HALDOL) tablet 5 mg  5 mg Oral TID PRN Vesta Mixer, NP       Or   haloperidol lactate (HALDOL) injection 5 mg  5 mg Intramuscular TID PRN Vesta Mixer, NP       HYDROcodone-acetaminophen (NORCO) 10-325 MG per tablet 1 tablet  1 tablet Oral TID PRN Ranae Palms, MD   1 tablet at 06/26/22 1300   hydrOXYzine (ATARAX) tablet 25 mg  25 mg Oral TID PRN Vesta Mixer, NP   25 mg at 06/22/22 1956   LORazepam (ATIVAN) injection 2 mg  2 mg Intramuscular TID PRN Vesta Mixer, NP       magnesium hydroxide (MILK OF MAGNESIA) suspension 30 mL  30 mL Oral Daily PRN Vesta Mixer, NP       melatonin tablet 6 mg  6 mg Oral QHS Charma Mocarski, NP       mirtazapine (REMERON) tablet 7.5 mg  7.5 mg Oral QHS Mayan Kloepfer, NP   7.5 mg at 06/25/22 2055   multivitamin with minerals tablet 1 tablet  1 tablet Oral Daily Nicholes Rough, NP   1 tablet at 06/26/22 0753   rosuvastatin (CRESTOR) tablet 10 mg  10 mg Oral Daily Nicholes Rough, NP   10 mg at 06/26/22 [phone]  thiamine (Vitamin B-1) tablet 100 mg  100 mg Oral Daily Nicholes Rough, NP   100 mg at 06/26/22 0753   thiamine (VITAMIN B1) injection 100 mg  100 mg Intramuscular Once Nicholes Rough, NP        Lab Results:  No  results found for this or any previous visit (from the past 48 hour(s)).  Blood Alcohol level:  Lab Results  Component Value Date   ETH <10 06/20/2022   ETH <10 [id number]   Metabolic Disorder Labs: Lab Results  Component Value Date   HGBA1C 5.6 08/31/2016   MPG 114 08/31/2016   MPG 120 (H) 05/24/2015   No results found for: "PROLACTIN" Lab Results  Component Value Date   CHOL 249 (H) 03/04/2018   TRIG 137 03/04/2018   HDL 52 03/04/2018   CHOLHDL 4.8 03/04/2018   VLDL 33 (H) 08/31/2016   LDLCALC 170 (H) 03/04/2018   LDLCALC 159 (H) 10/08/2017   Physical Findings: AIMS:  , ,  ,  ,    CIWA:  CIWA-Ar Total: 1 COWS:     Musculoskeletal: Strength & Muscle Tone: within normal limits Gait & Station: unsteady Patient leans:  Walks with a rolling walker  Psychiatric Specialty Exam:  Presentation  General Appearance:  Appropriate for Environment; Fairly Groomed  Eye Contact: Good  Speech: Clear and Coherent  Speech Volume: Normal  Handedness: Right  Mood and Affect  Mood: Depressed; Anxious  Affect: Congruent  Thought Process  Thought Processes: Coherent  Descriptions of Associations:Intact  Orientation:Full (Time, Place and Person)  Thought Content:Logical  History of Schizophrenia/Schizoaffective disorder:No data recorded Duration of Psychotic Symptoms:No data recorded Hallucinations:Hallucinations: None   Ideas of Reference:None  Suicidal Thoughts:Suicidal Thoughts: Yes, Passive SI Active Intent and/or Plan: Without Intent; Without Plan   Homicidal Thoughts:Homicidal Thoughts: No   Sensorium  Memory: Immediate Good  Judgment: Fair  Insight: Fair  Community education officer  Concentration: Good  Attention Span: Good  Recall: Good  Fund of Knowledge: Good  Language: Good  Psychomotor Activity  Psychomotor Activity: Psychomotor Activity: Normal   Assets  Assets: Communication Skills  Sleep  Sleep: Sleep:  Good   Physical Exam: Physical Exam Vitals and nursing note reviewed.  HENT:     Head: Normocephalic.     Nose: Nose normal.     Mouth/Throat:     Mouth: Mucous membranes are moist.     Pharynx: Oropharynx is clear.  Eyes:     Conjunctiva/sclera: Conjunctivae normal.     Pupils: Pupils are equal, round, and reactive to light.  Cardiovascular:     Rate and Rhythm: Normal rate.     Pulses: Normal pulses.     Comments: Blood pressure 143/100 pulse 69.  Patient asymptomatic.  Nursing staff to recheck vital signs. Abdominal:     Palpations: Abdomen is soft.  Genitourinary:    Comments: Deferred Musculoskeletal:        General: Normal range of motion.     Cervical back: Normal range of motion.  Skin:    General: Skin is warm.  Neurological:     General: No focal deficit present.     Mental Status: He is alert and oriented to person, place, and time.  Psychiatric:        Behavior: Behavior normal.    Review of Systems  Constitutional: Negative.   HENT: Negative.    Eyes: Negative.   Respiratory: Negative.    Cardiovascular: Negative.   Gastrointestinal: Negative.   Genitourinary: Negative.   Musculoskeletal:  Negative.   Skin: Negative.   Neurological: Negative.   Endo/Heme/Allergies: Negative.   Psychiatric/Behavioral:  Positive for depression, substance abuse and suicidal ideas. Negative for hallucinations. The patient is nervous/anxious and has insomnia.    Blood pressure 122/84, pulse 77, temperature 98.1 F (36.7 C), temperature source Oral, resp. rate 20, height 6\' 2"  (1.88 m), weight 101.1 kg, SpO2 99 %. Body mass index is 28.61 kg/m.  Treatment Plan Summary: Diagnoses:  Principal Problem:   MDD (major depressive disorder), recurrent severe, without psychosis ([place]) Active Problems:   Essential hypertension, benign   Insomnia   Cocaine use disorder ([facility])   Chronic pain syndrome   GAD (generalized anxiety disorder)   Medications -Continue Remeron 7.5 mg  nightly for sleep -Continue Wellbutrin XR to 300  mg daily for depressive symptoms (Home med) -Continue Norco 1tab Q 8 H PRN for pain (home med) -Continue Avapro 300 mg/Norvasc 10mg   daily for htn (home med) -Continue Hydroxyzine 25 mg TID PRN for anxiety -Increase Melatonin to 6 mg nightly for sleep -Continue Crestor 10 mg daily for hyperlipidemia (home med) -Continue Ativan 1mg  Q6H PRN for CIWA scores >10 -Continue agitation protocol Zyprexa/Ativan/Geodon PRN.  -Continue Keflex 500 mg po bid x 7 days for wound infection.   PRNS -Continue Tylenol 650 mg every 6 hours PRN for mild pain -Continue Maalox 30 mg every 4 hrs PRN for indigestion -Continue Imodium 2-4 mg as needed for diarrhea -Continue Milk of Magnesia as needed every 6 hrs for constipation -Continue Zofran disintegrating tabs every 6 hrs PRN for nausea    Discharge Planning: Social work and case management to assist with discharge planning and identification of hospital follow-up needs prior to discharge Estimated LOS: 5-7 days Discharge Concerns: Need to establish a safety plan; Medication compliance and effectiveness Discharge Goals: Return home with outpatient referrals for mental health follow-up including medication management/psychotherapy  Safety and Monitoring: Voluntary admission to inpatient psychiatric unit for safety, stabilization and treatment Daily contact with patient to assess and evaluate symptoms and progress in treatment Patient's case to be discussed in multi-disciplinary team meeting Observation Level : q15 minute checks Vital signs: q12 hours Precautions: Safety   I certify that inpatient services furnished can reasonably be expected to improve the patient's condition.     Nicholes Rough, NP, pmhnp. 06/26/2022, 1:38 PMPatient ID: Benjamin [name], male   DOB: [birth year], 57 y.o.

## 2022-06-26 NOTE — Group Note (Signed)
Recreation Therapy Group Note   Group Topic:Health and Wellness  Group Date: 06/26/2022 Start Time: 0930 End Time: 1000 Facilitators: Khori Underberg-McCall, LRT,CTRS Location: [address] Dayroom   Goal Area(s) Addresses:  Patient will verbalize benefit of exercise during group session. Patient will identify an exercise that can be completed post d/c. Patient will acknowledge benefits of exercise when used as a coping mechanism.   Group Description:  Exercise.  LRT introduced the activity of exercise patients.  LRT explained to patients they would be leading the activity by choosing the exercises that were completed by the group.  LRT and group completed three rounds of exercises.  Patients were instructed to take breaks if needed and listen to their bodies so as to not do any exercises that are outside of their ability.   Affect/Mood: N/A   Participation Level: Did not attend    Clinical Observations/Individualized Feedback:     Plan: Continue to engage patient in RT group sessions 2-3x/week.   Chia Rock-McCall, LRT,CTRS  06/26/2022 1:09 PM

## 2022-06-26 NOTE — Plan of Care (Signed)
  Problem: Self-Concept: Goal: Ability to disclose and discuss suicidal ideas will improve Outcome: Progressing   Problem: Health Behavior/Discharge Planning: Goal: Ability to make decisions will improve Outcome: Progressing   Problem: Self-Concept: Goal: Level of anxiety will decrease Outcome: Progressing   Problem: Coping: Goal: Coping ability will improve Outcome: Progressing   Problem: Medication: Goal: Compliance with prescribed medication regimen will improve Outcome: Progressing   Problem: Self-Concept: Goal: Will verbalize positive feelings about self Outcome: Progressing

## 2022-06-26 NOTE — Progress Notes (Signed)
   06/26/22 0615  15 Minute Checks  Location Bedroom  Visual Appearance Calm  Behavior Sleeping  Sleep (Behavioral Health Patients Only)  Calculate sleep? (Click Yes once per 24 hr at 0600 safety check) Yes  Documented sleep last 24 hours 8

## 2022-06-26 NOTE — [facility] Group Notes (Signed)
PsychoEducational Group Note. Patients were given poem by Cristopher Peru, titled '' There's a hole in my sidewalk'' as it describes repeating negative behavioral patterns. Patients were then asked to reflect on negative patterns in their own life they would like to change, using reflection and positive reframing.  Pt did not attend.

## 2022-06-26 NOTE — BH IP Treatment Plan (Signed)
Interdisciplinary Treatment and Diagnostic Plan Update  06/26/2022 Time of Session: 8:30am, update Benjamin [name] MRN: [id number]  Principal Diagnosis: MDD (major depressive disorder), recurrent severe, without psychosis  Secondary Diagnoses: Principal Problem:   MDD (major depressive disorder), recurrent severe, without psychosis Active Problems:   Essential hypertension, benign   Insomnia   Cocaine use disorder   Chronic pain syndrome   GAD (generalized anxiety disorder)   Current Medications:  Current Facility-Administered Medications  Medication Dose Route Frequency Provider Last Rate Last Admin   acetaminophen (TYLENOL) tablet 650 mg  650 mg Oral Q6H PRN Vesta Mixer, NP   650 mg at 06/21/22 0254   alum & mag hydroxide-simeth (MAALOX/MYLANTA) 200-200-20 MG/5ML suspension 30 mL  30 mL Oral Q4H PRN Vesta Mixer, NP       amLODipine (NORVASC) tablet 10 mg  10 mg Oral Daily Nicholes Rough, NP   10 mg at 06/26/22 [id number]   And   irbesartan (AVAPRO) tablet 300 mg  300 mg Oral Daily Nicholes Rough, NP   300 mg at 06/26/22 0753   bacitracin ointment   Topical BID Janine Limbo, MD   [id number] Application at [id number]   buPROPion (WELLBUTRIN XL) 24 hr tablet 300 mg  300 mg Oral Daily Nicholes Rough, NP   300 mg at 06/26/22 0754   cephALEXin (KEFLEX) capsule 500 mg  500 mg Oral Q12H Lindell Spar I, NP   500 mg at 06/26/22 0754   haloperidol (HALDOL) tablet 5 mg  5 mg Oral TID PRN Vesta Mixer, NP       Or   haloperidol lactate (HALDOL) injection 5 mg  5 mg Intramuscular TID PRN Vesta Mixer, NP       HYDROcodone-acetaminophen (NORCO) 10-325 MG per tablet 1 tablet  1 tablet Oral TID PRN Ranae Palms, MD   1 tablet at 06/26/22 1300   hydrOXYzine (ATARAX) tablet 25 mg  25 mg Oral TID PRN Vesta Mixer, NP   25 mg at 06/22/22 1956   LORazepam (ATIVAN) injection 2 mg  2 mg Intramuscular TID PRN Vesta Mixer, NP       magnesium hydroxide (MILK OF MAGNESIA)  suspension 30 mL  30 mL Oral Daily PRN Vesta Mixer, NP       melatonin tablet 6 mg  6 mg Oral QHS Nkwenti, Doris, NP       mirtazapine (REMERON) tablet 7.5 mg  7.5 mg Oral QHS Nkwenti, Doris, NP   7.5 mg at 06/25/22 2055   multivitamin with minerals tablet 1 tablet  1 tablet Oral Daily Nicholes Rough, NP   1 tablet at 06/26/22 0753   rosuvastatin (CRESTOR) tablet 10 mg  10 mg Oral Daily Nicholes Rough, NP   10 mg at 06/26/22 0753   thiamine (Vitamin B-1) tablet 100 mg  100 mg Oral Daily Nicholes Rough, NP   100 mg at 06/26/22 [id number]   thiamine (VITAMIN B1) injection 100 mg  100 mg Intramuscular Once Nicholes Rough, NP       PTA Medications: Medications Prior to Admission  Medication Sig Dispense Refill Last Dose   ALPRAZolam (XANAX) 0.5 MG tablet Take 0.5 mg by mouth daily as needed.      amLODipine-valsartan (EXFORGE) 5-160 MG tablet Take 1 tablet by mouth daily.      HYDROcodone-acetaminophen (NORCO) 10-325 MG tablet Take 0.5-1 tablets by mouth every 8 (eight) hours as needed.      rosuvastatin (CRESTOR) 10 MG tablet Take 10 mg by mouth daily.  buPROPion (WELLBUTRIN XL) 300 MG 24 hr tablet Take 300 mg by mouth daily.      DULoxetine (CYMBALTA) 60 MG capsule Take 60 mg by mouth daily. (Patient not taking: Reported on 06/21/2022)   Not Taking    Patient Stressors: Financial difficulties   Health problems   Medication change or noncompliance   Substance abuse    Patient Strengths: Hydrographic surveyor for treatment/growth  Supportive family/friends   Treatment Modalities: Medication Management, Group therapy, Case management,  1 to 1 session with clinician, Psychoeducation, Recreational therapy.   Physician Treatment Plan for Primary Diagnosis: MDD (major depressive disorder), recurrent severe, without psychosis Long Term Goal(s): Improvement in symptoms so as ready for discharge   Short Term Goals: Ability to identify changes in lifestyle to reduce recurrence of  condition will improve Ability to disclose and discuss suicidal ideas Ability to demonstrate self-control will improve Ability to identify and develop effective coping behaviors will improve Ability to maintain clinical measurements within normal limits will improve Compliance with prescribed medications will improve Ability to identify triggers associated with substance abuse/mental health issues will improve  Medication Management: Evaluate patient's response, side effects, and tolerance of medication regimen.  Therapeutic Interventions: 1 to 1 sessions, Unit Group sessions and Medication administration.  Evaluation of Outcomes: Progressing  Physician Treatment Plan for Secondary Diagnosis: Principal Problem:   MDD (major depressive disorder), recurrent severe, without psychosis Active Problems:   Essential hypertension, benign   Insomnia   Cocaine use disorder   Chronic pain syndrome   GAD (generalized anxiety disorder)  Long Term Goal(s): Improvement in symptoms so as ready for discharge   Short Term Goals: Ability to identify changes in lifestyle to reduce recurrence of condition will improve Ability to disclose and discuss suicidal ideas Ability to demonstrate self-control will improve Ability to identify and develop effective coping behaviors will improve Ability to maintain clinical measurements within normal limits will improve Compliance with prescribed medications will improve Ability to identify triggers associated with substance abuse/mental health issues will improve     Medication Management: Evaluate patient's response, side effects, and tolerance of medication regimen.  Therapeutic Interventions: 1 to 1 sessions, Unit Group sessions and Medication administration.  Evaluation of Outcomes: Progressing   RN Treatment Plan for Primary Diagnosis: MDD (major depressive disorder), recurrent severe, without psychosis Long Term Goal(s): Knowledge of disease and  therapeutic regimen to maintain health will improve  Short Term Goals: Ability to remain free from injury will improve, Ability to verbalize frustration and anger appropriately will improve, Ability to demonstrate self-control, Ability to participate in decision making will improve, Ability to verbalize feelings will improve, Ability to disclose and discuss suicidal ideas, Ability to identify and develop effective coping behaviors will improve, and Compliance with prescribed medications will improve  Medication Management: RN will administer medications as ordered by provider, will assess and evaluate patient's response and provide education to patient for prescribed medication. RN will report any adverse and/or side effects to prescribing provider.  Therapeutic Interventions: 1 on 1 counseling sessions, Psychoeducation, Medication administration, Evaluate responses to treatment, Monitor vital signs and CBGs as ordered, Perform/monitor CIWA, COWS, AIMS and Fall Risk screenings as ordered, Perform wound care treatments as ordered.  Evaluation of Outcomes: Progressing   LCSW Treatment Plan for Primary Diagnosis: MDD (major depressive disorder), recurrent severe, without psychosis Long Term Goal(s): Safe transition to appropriate next level of care at discharge, Engage patient in therapeutic group addressing interpersonal concerns.  Short Term Goals: Engage patient in  aftercare planning with referrals and resources, Increase social support, Increase ability to appropriately verbalize feelings, Increase emotional regulation, Facilitate acceptance of mental health diagnosis and concerns, Facilitate patient progression through stages of change regarding substance use diagnoses and concerns, Identify triggers associated with mental health/substance abuse issues, and Increase skills for wellness and recovery  Therapeutic Interventions: Assess for all discharge needs, 1 to 1 time with Social worker, Explore  available resources and support systems, Assess for adequacy in community support network, Educate family and significant other(s) on suicide prevention, Complete Psychosocial Assessment, Interpersonal group therapy.  Evaluation of Outcomes: Progressing   Progress in Treatment: Attending groups: Yes. Participating in groups: Yes. Taking medication as prescribed: Yes. Toleration medication: Yes. Family/Significant other contact made: Yes, individual(s) contacted:  Rosalie Doctor [phone] Patient understands diagnosis: Yes. Discussing patient identified problems/goals with staff: Yes. Medical problems stabilized or resolved: Yes. Denies suicidal/homicidal ideation: Yes. Issues/concerns per patient self-inventory: No.   New problem(s) identified: No, Describe:  none reported   New Short Term/Long Term Goal(s):   medication stabilization, elimination of SI thoughts, development of comprehensive mental wellness plan.    Patient Goals:  " Feel strong enough and confident not to hurt myself "   Discharge Plan or Barriers:  Patient will be connected to med management and therapy.  Reason for Continuation of Hospitalization: Anxiety Depression Medication stabilization  Estimated Length of Stay: 1-2 days  Last 3 [place] Suicide Severity Risk Score: [place] Admission (Current) from 06/21/2022 in [place] [facility] ED from 06/20/2022 in [facility] Emergency Department at [facility] ED from 11/22/2017 in [facility] Emergency Department at [place] High Risk High Risk High Risk       Last [facility] 2/9 Scores:    10/08/2017    2:29 PM  Depression screen PHQ 2/9  Decreased Interest 2  Down, Depressed, Hopeless 1  PHQ - 2 Score 3  Altered sleeping 1  Tired, decreased energy 1  Change in appetite 0  Feeling bad or failure about yourself  0  Trouble concentrating 0  Moving slowly or fidgety/restless 0   Suicidal thoughts 0  PHQ-9 Score 5    Scribe for Treatment Team: Zachery Conch, LCSW 06/26/2022 4:29 PM

## 2022-06-26 NOTE — Progress Notes (Signed)
  Delight Ovens, Chaplain  Othelia Pulling, Chaplain Spiritual care group on grief and loss facilitated by [employer], [employer] and Chaplain Keyarra Rendall   Group Goal: Support / Education around grief and loss   Members engage in facilitated group support and psycho-social education.   Group Description:   Following introductions and group rules, group members engaged in facilitated group dialogue and support around topic of loss, with particular support around experiences of loss in their lives. Group Identified types of loss (relationships / self / things) and identified patterns, circumstances, and changes that precipitate losses. Reflected on thoughts / feelings around loss, normalized grief responses, and recognized variety in grief experience. Group encouraged individual reflection on safe space and on the coping skills that they are already utilizing.   Group drew on Adlerian / Rogerian and narrative framework   Patient Progress: Patient attended group and was engaged in topic. Patient participated in discussions and was supportive of peers.      Nesiah Jump [employer]

## 2022-06-26 NOTE — BHH Group Notes (Signed)
Patient did not attend the AA group. 

## 2022-06-26 NOTE — Group Note (Unsigned)
Date:  06/26/2022 Time:  1:36 PM  Group Topic/Focus:  Wellness Toolbox:   The focus of this group is to discuss various aspects of wellness, balancing those aspects and exploring ways to increase the ability to experience wellness.  Patients will create a wellness toolbox for use upon discharge.     Participation Level:  {BHH PARTICIPATION HD:[id number]  Participation Quality:  {BHH PARTICIPATION QUALITY:22265}  Affect:  {BHH AFFECT:22266}  Cognitive:  {BHH COGNITIVE:22267}  Insight: {BHH Insight2:20797}  Engagement in Group:  {BHH ENGAGEMENT IN J[place]:[id number]  Modes of Intervention:  {BHH MODES OF INTERVENTION:22269}  Additional Comments:  ***  Garvin Fila 06/26/2022, 1:36 PM

## 2022-06-26 NOTE — BHH Counselor (Signed)
BHH/BMU LCSW Progress Note   06/26/2022    4:51 PM  Benjamin [name]      Type of Note: Visit    CSW went to see patient today after getting a report from NP about his interest in going to a shelter. CSW and patient spoke for about 30 minutes; Patient feels that his significant other is not supportive and wants him to go to [facility] treatment center and if not he cannot return home. Patient states that he is fed up and just wants to go home for a few weeks to get his physical health back on track and then go to treatment center. Afterwards patient asked for the disability transportation information so he can sign up to get his own transportation and CSW printed it off for him. CSW will continue to assist.     Signed:   Silas Flood, MSW, [facility] 06/26/2022 4:51 PM

## 2022-06-26 NOTE — Progress Notes (Signed)
   06/25/22 2100  Psych Admission Type (Psych Patients Only)  Admission Status Voluntary  Psychosocial Assessment  Patient Complaints Anxiety;Depression  Eye Contact Fair  Facial Expression Sad  Affect Appropriate to circumstance  Speech Logical/coherent  Interaction Assertive  Motor Activity Slow  Appearance/Hygiene Unremarkable  Behavior Characteristics Cooperative;Appropriate to situation  Mood Depressed  Thought Process  Coherency WDL  Content WDL  Delusions None reported or observed  Perception WDL  Hallucination None reported or observed  Judgment Poor  Confusion None  Danger to Self  Current suicidal ideation? Denies  Agreement Not to Harm Self Yes  Description of Agreement verbal contract  Danger to Others  Danger to Others None reported or observed

## 2022-06-26 NOTE — Plan of Care (Signed)
  Problem: Medication: Goal: Compliance with prescribed medication regimen will improve Outcome: Progressing   Problem: Coping: Goal: Ability to identify and develop effective coping behavior will improve Outcome: Progressing   Problem: Safety: Goal: Ability to disclose and discuss suicidal ideas will improve Outcome: Progressing   Problem: Self-Concept: Goal: Will verbalize positive feelings about self Outcome: Progressing   Problem: Education: Goal: Ability to make informed decisions regarding treatment will improve Outcome: Progressing   Problem: Coping: Goal: Coping ability will improve Outcome: Progressing   Problem: Self-Concept: Goal: Ability to disclose and discuss suicidal ideas will improve Outcome: Progressing

## 2022-06-27 DIAGNOSIS — F332 Major depressive disorder, recurrent severe without psychotic features: Secondary | ICD-10-CM | POA: Diagnosis not present

## 2022-06-27 NOTE — Progress Notes (Signed)
   06/26/22 2100  Psych Admission Type (Psych Patients Only)  Admission Status Voluntary  Psychosocial Assessment  Patient Complaints Anxiety;Depression  Eye Contact Brief  Facial Expression Anxious;Sad  Affect Appropriate to circumstance  Speech Logical/coherent  Interaction Assertive  Motor Activity Slow  Appearance/Hygiene Unremarkable  Behavior Characteristics Cooperative;Appropriate to situation  Mood Depressed  Thought Process  Coherency WDL  Content WDL  Delusions None reported or observed  Perception WDL  Hallucination None reported or observed  Judgment Poor  Confusion None  Danger to Self  Current suicidal ideation? Denies  Agreement Not to Harm Self Yes  Description of Agreement Verbal  Danger to Others  Danger to Others None reported or observed

## 2022-06-27 NOTE — Progress Notes (Addendum)
Pt reported passive SI this morning, stating "I do not have a plan for how I want to hurt myself right now". Pt denied HI/AVH. Pt verbally contracts for safety. Pt has been pleasant, calm, and cooperative throughout the shift. Pt reports 7/10 chronic back pain throughout the day, PRN Hydrocodone provided as ordered. Pt given scheduled medications as prescribed. Q15 min checks verified for safety. Patient verbally contracts for safety. Patient compliant with medications and treatment plan. Patient is interacting well on the unit. Pt is safe on the unit.   06/27/22 1032  Psych Admission Type (Psych Patients Only)  Admission Status Voluntary  Psychosocial Assessment  Patient Complaints Anxiety;Depression  Eye Contact Fair  Facial Expression Anxious;Sad  Affect Depressed  Speech Logical/coherent  Interaction Assertive  Motor Activity Slow  Appearance/Hygiene Unremarkable  Behavior Characteristics Cooperative;Appropriate to situation  Mood Depressed;Anxious  Thought Process  Coherency WDL  Content WDL  Delusions None reported or observed  Perception WDL  Hallucination None reported or observed  Judgment Impaired  Confusion None  Danger to Self  Current suicidal ideation? Denies  Self-Injurious Behavior No self-injurious ideation or behavior indicators observed or expressed   Agreement Not to Harm Self No  Description of Agreement Pt verbally contracts for safety  Danger to Others  Danger to Others None reported or observed

## 2022-06-27 NOTE — Progress Notes (Signed)
Adult Psychoeducational Group Note  Date:  06/27/2022 Time:  8:47 PM  Group Topic/Focus:  Wrap-Up Group:   The focus of this group is to help patients review their daily goal of treatment and discuss progress on daily workbooks.  Participation Level:  Active  Participation Quality:  Appropriate  Affect:  Appropriate  Cognitive:  Appropriate  Insight: Appropriate  Engagement in Group:  Engaged  Modes of Intervention:  Discussion and Support  Additional Comments:  Pt attended the evening group and responded to all discussion prompts from the [name]. Pt shared that today was a good day on the unit, the highlight of which was talking to a supportive friend on the phone. On the subject of staying well upon discharge, Benjamin [name] mentioned wanting to be more self-aware of his own emotions going forward. He rated his day a 6 out of 10.  Wende Crease 06/27/2022, 8:47 PM

## 2022-06-27 NOTE — Group Note (Signed)
Recreation Therapy Group Note   Group Topic:Animal Assisted Therapy   Group Date: 06/27/2022 Start Time: 0945 End Time: 1030 Facilitators: Okie Bogacz-McCall, LRT,CTRS Location: [address] Dayroom   Animal-Assisted Activity (AAA) Program Checklist/Progress Notes Patient Eligibility Criteria Checklist & Daily Group note for Rec Tx Intervention  AAA/T Program Assumption of Risk Form signed by Patient/ or Parent Legal Guardian Yes  Patient understands his/her participation is voluntary Yes   Affect/Mood: N/A   Participation Level: Did not attend    Clinical Observations/Individualized Feedback:     Plan: Continue to engage patient in RT group sessions 2-3x/week.   Kima Malenfant-McCall, LRT,CTRS  06/27/2022 12:50 PM

## 2022-06-27 NOTE — Progress Notes (Signed)
   06/27/22 0559  15 Minute Checks  Location Bedroom  Visual Appearance Calm  Behavior Sleeping  Sleep (Behavioral Health Patients Only)  Calculate sleep? (Click Yes once per 24 hr at 0600 safety check) Yes  Documented sleep last 24 hours 7.75

## 2022-06-28 DIAGNOSIS — F332 Major depressive disorder, recurrent severe without psychotic features: Secondary | ICD-10-CM | POA: Diagnosis not present

## 2022-06-28 NOTE — BHH Group Notes (Signed)
Pt did not at NA group

## 2022-06-28 NOTE — Group Note (Signed)
Recreation Therapy Group Note   Group Topic:Other  Group Date: 06/28/2022 Start Time: 1400 End Time: 1440 Facilitators: Stanisha Lorenz-McCall, LRT,CTRS Location: [address] Dayroom   Activity Description/Intervention: Therapeutic Drumming. Patients with peers and staff were given the opportunity to engage in a leader facilitated [place] with staff from the [employer], in partnership with [employer]. Nurse, adult and trained [employer], Devin Going leading with LRT observing and documenting intervention and pt response. This evidenced-based practice targets 7 areas of health and wellbeing in the human experience including: stress-reduction, exercise, self-expression, camaraderie/support, nurturing, spirituality, and music-making (leisure).   Goal Area(s) Addresses:  Patient will engage in pro-social way in music group.  Patient will follow directions of drum leader on the first prompt. Patient will identify if a reduction in stress level occurs as a result of participation in therapeutic drum circle.     Affect/Mood: N/A   Participation Level: Did not attend    Clinical Observations/Individualized Feedback:     Plan: Continue to engage patient in RT group sessions 2-3x/week.   Areli Jowett-McCall, LRT,CTRS 06/28/2022 3:31 PM

## 2022-06-28 NOTE — Progress Notes (Signed)
   06/27/22 2200  Psych Admission Type (Psych Patients Only)  Admission Status Voluntary  Psychosocial Assessment  Patient Complaints Anxiety;Depression  Eye Contact Fair  Facial Expression Anxious;Sad  Affect Depressed  Speech Logical/coherent  Interaction Assertive  Motor Activity Slow  Appearance/Hygiene Unremarkable  Behavior Characteristics Cooperative;Appropriate to situation  Mood Depressed;Anxious  Thought Process  Coherency WDL  Content WDL  Delusions None reported or observed  Perception WDL  Hallucination None reported or observed  Judgment Impaired  Confusion None  Danger to Self  Current suicidal ideation? Denies  Self-Injurious Behavior No self-injurious ideation or behavior indicators observed or expressed   Agreement Not to Harm Self Yes  Description of Agreement Verbal contract  Danger to Others  Danger to Others None reported or observed

## 2022-06-28 NOTE — Progress Notes (Signed)
[facility] MD Progress Note  06/28/2022 2:49 PM Benjamin [name]  MRN:  [id number]  Principal Problem: MDD (major depressive disorder), recurrent severe, without psychosis  Diagnosis: Principal Problem:   MDD (major depressive disorder), recurrent severe, without psychosis Active Problems:   Essential hypertension, benign   Insomnia   Cocaine use disorder   Chronic pain syndrome   GAD (generalized anxiety disorder)  Reason for admission: Patient reports that "Last Saturday, I blacked out.", but then goes on to recount the series of events that happened during the time of his black out, as quoted above under "Chief compliant", and states that he remembers the details.  He states that Xanax which was a new prescription which he had taken for a month prior to the events above, triggered the episode that he had leading up to this hospitalization.  He admits to feeling suicidal during that time, states that he was on "a suicidal mission", reports that he made a cut to his left hand with a piece of glass that he found, and is observed to have a deep cut to his left thumb area.  Orders placed for bacitracin ointment, and for nursing to cleanse the area with normal saline solution, apply bacitracin ointment to area, prior to covering with a nonadherent dressing or Band-Aid. He states that a tetanus vaccine was administered to him in the ER.   24-hour chart Review: Past 24 hours of patient's chart was reviewed. V/S WNL for the past 24 hrs. Slept for 6.75 hrs last night as per nursing documentation. Patient is compliant with scheduled meds. Required PRNs: Hydrocodone and hydroxyzine Per RN notes, no documented behavioral issues and is continuing to be in attendance of some group sessions.  Daily notes, 06/28/2022: During this encounter, pt is seen in his room sitting in bed. Pt's attention to personal hygiene and grooming is good today, eye contact is good, speech is clear & coherent. Thought contents are organized  and logical, pt  denies SI/HI/AVH or paranoia. There is no evidence of delusional thoughts.    Pt reports that his sleep quality last night was good, he reports that his appetite is continuing to improve, reports that pain is better controlled today and is 6 (10 being worst). He continues to state that he is apprehensive about going to "[facility]", talks about the Lucianne Lei transfers there which will be uncomfortable, talks about the program manager over there not being truthful about the modifications that she stated that would be made for him. Writer educated pt that his initial discharge date had been Tuesday of this week, but moved to Tomorrow (4/3) due to staff giving him more time to work out things with "[facility]" since this is a program picked by himself and his wife. Pt educated that plan remains to discharge him tomorrow (4/3), so that he can have time to go do the other things that he plans to do such as going to his pain management provider and his other providers, and packing up his belongings etc. Pt later approached writer at the nurses' station requesting to be discharged on Monday (4/8), but did not give a rationale as to why he thinks that he should remain hospitalized until that day.   Pt has consistently denied SI/HI/AVH for the past 48 hrs to Building services engineer. Plan is to discharge him tomorrow as his mood is currently stable enough to be managed on an outpatient basis. Inpatient medication management has been completed, and Probation officer and CSW will call pt's wife  to let her and pt know of plan. We will continue medications as listed below.  Total Time spent with patient: 45 minutes  Past Psychiatric History:  Previous Psych Diagnoses: MDD, polysubstance abuse Prior inpatient treatment: [facility] admissions on 08/13/15, 08/24/15, 11/13/17 & 11/26/17. Current/prior outpatient treatment: "Dr Sherrie Mustache" Prior rehab [id number]  Psychotherapy [id number] having one currently  History of suicide attempts: Multiple  via cutting since childhood as per patient and via sitting on railroad tracks while living in [place]   Past Medical History:  Past Medical History:  Diagnosis Date   Chronic back pain    managed by [facility]   CKD (chronic kidney disease), stage III ([place]) 10/2017   Erectile dysfunction    History of substance abuse ([place])    Hx of suicide attempt    Hypertension     Past Surgical History:  Procedure Laterality Date   ANKLE SURGERY     right   APPLICATION OF WOUND VAC Right 08/17/2015   Procedure: APPLICATION OF WOUND VAC;  Surgeon: Roseanne Kaufman, MD;  Location: [place];  Service: Orthopedics;  Laterality: Right;   HAND SURGERY     fracture, ORIG, teenage years, right.   I & D EXTREMITY Right 08/10/2015   Procedure: IRRIGATION AND DEBRIDEMENT SKIN, SUBCUTANEOS TISSUE AND  MUSCLE, CARPAL TUNNEL RELEASE, MEDIAN NERVE LYSIS WITH NERVE WRAP;  Surgeon: Roseanne Kaufman, MD;  Location: [place];  Service: Orthopedics;  Laterality: Right;   INCISION AND DRAINAGE OF WOUND Right 08/15/2015   Procedure: IRRIGATION AND DEBRIDEMENT WOUND;  Surgeon: Justice Britain, MD;  Location: [facility];  Service: Orthopedics;  Laterality: Right;   LUMBAR EPIDURAL INJECTION     [facility]   SKIN SPLIT GRAFT Right 08/17/2015   Procedure: I&D RIGHT FOREARM/POSSIBLE SKIN GRAFT;  Surgeon: Roseanne Kaufman, MD;  Location: [place];  Service: Orthopedics;  Laterality: Right;   TENDON REPAIR N/A 07/24/2015   Procedure: WRIST LACERATION AND TENDON REPAIR;  Surgeon: Iran Planas, MD;  Location: [place];  Service: Orthopedics;  Laterality: N/A;   WOUND EXPLORATION Right 08/15/2015   Procedure: WOUND EXPLORATION right forearm;  Surgeon: Justice Britain, MD;  Location: [facility];  Service: Orthopedics;  Laterality: Right;   Family History:  Family History  Problem Relation Age of Onset   Cancer Mother        lung, smoker   Anxiety disorder Mother    Cancer Father        throat   Diabetes Paternal Grandmother     Cancer Paternal Aunt    Colon cancer Paternal Aunt    Heart disease Neg Hx    Stroke Neg Hx    Esophageal cancer Neg Hx    Inflammatory bowel disease Neg Hx    Liver disease Neg Hx    Pancreatic cancer Neg Hx    Rectal cancer Neg Hx    Stomach cancer Neg Hx    Family Psychiatric  History:  Mother with MDD, attempted suicide multiple times, didn't succeed, but, but died of cancer. Psych [id number] SA/HA:mother, but didn't complete Substance use family [id number]   Social History:  Social History   Substance and Sexual Activity  Alcohol Use Yes   Alcohol/week: 0.0 standard drinks of alcohol   Comment: occasional     Social History   Substance and Sexual Activity  Drug Use Yes   Types: Marijuana, Cocaine, Heroin   Comment: last use cocaine 08/08/15, last use MJ 08/08/15    Social History  Socioeconomic History   Marital status: Single    Spouse name: Not on file   Number of children: 0   Years of education: Not on file   Highest education level: Not on file  Occupational History   Occupation: unemployed  Tobacco Use   Smoking status: Former    Packs/day: 0.50    Years: 6.00    Additional pack years: 0.00    Total pack years: 3.00    Types: Cigarettes   Smokeless tobacco: Never  Vaping Use   Vaping Use: Never used  Substance and Sexual Activity   Alcohol use: Yes    Alcohol/week: 0.0 standard drinks of alcohol    Comment: occasional   Drug use: Yes    Types: Marijuana, Cocaine, Heroin    Comment: last use cocaine 08/08/15, last use MJ 08/08/15   Sexual activity: Yes  Other Topics Concern   Not on file  Social History Narrative   Single,prior worked as Teacher, 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   No children.  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   09/2017.    Social Determinants of Health   Financial Resource Strain: Not on file  Food Insecurity: Not on file  Transportation Needs: Not on file  Physical Activity: Not on file  Stress:  Not on file  Social Connections: Not on file   Additional Social History:    Sleep: Fair  Appetite:  Good  Current Medications: Current Facility-Administered Medications  Medication Dose Route Frequency Provider Last Rate Last Admin   acetaminophen (TYLENOL) tablet 650 mg  650 mg Oral Q6H PRN Vesta Mixer, NP   650 mg at 06/21/22 0254   alum & mag hydroxide-simeth (MAALOX/MYLANTA) 200-200-20 MG/5ML suspension 30 mL  30 mL Oral Q4H PRN Vesta Mixer, NP       amLODipine (NORVASC) tablet 10 mg  10 mg Oral Daily Bonny Egger, NP   10 mg at 06/28/22 [id number]   And   irbesartan (AVAPRO) tablet 300 mg  300 mg Oral Daily Nicholes Rough, NP   300 mg at 06/28/22 0747   bacitracin ointment   Topical BID Janine Limbo, MD   Given at 06/28/22 0747   buPROPion (WELLBUTRIN XL) 24 hr tablet 300 mg  300 mg Oral Daily Nicholes Rough, NP   300 mg at 06/28/22 0747   cephALEXin (KEFLEX) capsule 500 mg  500 mg Oral Q12H Lindell Spar I, NP   500 mg at 06/28/22 0747   haloperidol (HALDOL) tablet 5 mg  5 mg Oral TID PRN Vesta Mixer, NP       Or   haloperidol lactate (HALDOL) injection 5 mg  5 mg Intramuscular TID PRN Vesta Mixer, NP       HYDROcodone-acetaminophen (NORCO) 10-325 MG per tablet 1 tablet  1 tablet Oral TID PRN Ranae Palms, MD   1 tablet at 06/28/22 1212   hydrOXYzine (ATARAX) tablet 25 mg  25 mg Oral TID PRN Vesta Mixer, NP   25 mg at 06/22/22 1956   LORazepam (ATIVAN) injection 2 mg  2 mg Intramuscular TID PRN Vesta Mixer, NP       magnesium hydroxide (MILK OF MAGNESIA) suspension 30 mL  30 mL Oral Daily PRN Vesta Mixer, NP       melatonin tablet 6 mg  6 mg Oral QHS Dorrian Doggett, NP   6 mg at 06/27/22 2151   mirtazapine (REMERON) tablet 7.5 mg  7.5 mg Oral QHS Geovana Gebel, NP   7.5 mg at  06/27/22 2152   multivitamin with minerals tablet 1 tablet  1 tablet Oral Daily Nicholes Rough, NP   1 tablet at 06/28/22 0747   rosuvastatin (CRESTOR) tablet 10 mg   10 mg Oral Daily Nicholes Rough, NP   10 mg at 06/28/22 0747   thiamine (Vitamin B-1) tablet 100 mg  100 mg Oral Daily Nicholes Rough, NP   100 mg at 06/28/22 0747   thiamine (VITAMIN B1) injection 100 mg  100 mg Intramuscular Once Nicholes Rough, NP        Lab Results:  No results found for this or any previous visit (from the past 36 hour(s)).  Blood Alcohol level:  Lab Results  Component Value Date   ETH <10 06/20/2022   ETH <10 [id number]   Metabolic Disorder Labs: Lab Results  Component Value Date   HGBA1C 5.6 08/31/2016   MPG 114 08/31/2016   MPG 120 (H) 05/24/2015   No results found for: "PROLACTIN" Lab Results  Component Value Date   CHOL 249 (H) 03/04/2018   TRIG 137 03/04/2018   HDL 52 03/04/2018   CHOLHDL 4.8 03/04/2018   VLDL 33 (H) 08/31/2016   LDLCALC 170 (H) 03/04/2018   LDLCALC 159 (H) 10/08/2017   Physical Findings: AIMS: 0 CIWA:  CIWA-Ar Total: 0 COWS:     Musculoskeletal: Strength & Muscle Tone: within normal limits Gait & Station: unsteady Patient leans:  Walks with a rolling walker  Psychiatric Specialty Exam:  Presentation  General Appearance:  Appropriate for Environment; Fairly Groomed  Eye Contact: Fair  Speech: Clear and Coherent  Speech Volume: Normal  Handedness: Right  Mood and Affect  Mood: Euthymic  Affect: Appropriate; Congruent  Thought Process  Thought Processes: Coherent  Descriptions of Associations:Intact  Orientation:Full (Time, Place and Person)  Thought Content:Logical  History of Schizophrenia/Schizoaffective disorder:No data recorded Duration of Psychotic Symptoms:No data recorded Hallucinations:Hallucinations: None    Ideas of Reference:None  Suicidal Thoughts:Suicidal Thoughts: No    Homicidal Thoughts:Homicidal Thoughts: No    Sensorium  Memory: Immediate Good  Judgment: Good  Insight: Good  Executive Functions  Concentration: Good  Attention  Span: Good  Recall: Good  Fund of Knowledge: Good  Language: Good  Psychomotor Activity  Psychomotor Activity: Psychomotor Activity: Normal    Assets  Assets: Communication Skills; Social Support; Resilience  Sleep  Sleep: Sleep: Good    Physical Exam: Physical Exam Vitals and nursing note reviewed.  HENT:     Head: Normocephalic.     Nose: Nose normal.     Mouth/Throat:     Mouth: Mucous membranes are moist.     Pharynx: Oropharynx is clear.  Eyes:     Conjunctiva/sclera: Conjunctivae normal.     Pupils: Pupils are equal, round, and reactive to light.  Cardiovascular:     Rate and Rhythm: Normal rate.     Pulses: Normal pulses.     Comments: Blood pressure 143/100 pulse 69.  Patient asymptomatic.  Nursing staff to recheck vital signs. Abdominal:     Palpations: Abdomen is soft.  Genitourinary:    Comments: Deferred Musculoskeletal:        General: Normal range of motion.     Cervical back: Normal range of motion.  Skin:    General: Skin is warm.  Neurological:     General: No focal deficit present.     Mental Status: He is alert and oriented to person, place, and time.  Psychiatric:        Behavior: Behavior normal.  Review of Systems  Constitutional: Negative.   HENT: Negative.    Eyes: Negative.   Respiratory: Negative.    Cardiovascular: Negative.   Gastrointestinal: Negative.   Genitourinary: Negative.   Musculoskeletal: Negative.   Skin: Negative.   Neurological: Negative.   Endo/Heme/Allergies: Negative.   Psychiatric/Behavioral:  Positive for depression and substance abuse. Negative for hallucinations and suicidal ideas. The patient is nervous/anxious and has insomnia.    Blood pressure 131/82, pulse 70, temperature 98.4 F (36.9 C), temperature source Oral, resp. rate 16, height 6\' 2"  (1.88 m), weight 101.1 kg, SpO2 98 %. Body mass index is 28.61 kg/m.  Treatment Plan Summary: Diagnoses:  Principal Problem:   MDD (major  depressive disorder), recurrent severe, without psychosis ([place]) Active Problems:   Essential hypertension, benign   Insomnia   Cocaine use disorder (H[facility])   Chronic pain syndrome   GAD (generalized anxiety disorder)   Medications -Continue Remeron 7.5 mg nightly for sleep -Continue Wellbutrin XR to 300  mg daily for depressive symptoms (Home med) -Continue Norco 1tab Q 8 H PRN for pain (home med) -Continue Avapro 300 mg/Norvasc 10mg   daily for htn (home med) -Continue Hydroxyzine 25 mg TID PRN for anxiety -Continue Melatonin to 6 mg nightly for sleep -Continue Crestor 10 mg daily for hyperlipidemia (home med) -Continue Ativan 1mg  Q6H PRN for CIWA scores >10 -Continue agitation protocol Zyprexa/Ativan/Geodon PRN.  -Continue Keflex 500 mg po bid x 7 days for wound infection.   PRNS -Continue Tylenol 650 mg every 6 hours PRN for mild pain -Continue Maalox 30 mg every 4 hrs PRN for indigestion -Continue Imodium 2-4 mg as needed for diarrhea -Continue Milk of Magnesia as needed every 6 hrs for constipation -Continue Zofran disintegrating tabs every 6 hrs PRN for nausea    Discharge Planning: Social work and case management to assist with discharge planning and identification of hospital follow-up needs prior to discharge Estimated LOS: 5-7 days Discharge Concerns: Need to establish a safety plan; Medication compliance and effectiveness Discharge Goals: Return home with outpatient referrals for mental health follow-up including medication management/psychotherapy  Safety and Monitoring: Voluntary admission to inpatient psychiatric unit for safety, stabilization and treatment Daily contact with patient to assess and evaluate symptoms and progress in treatment Patient's case to be discussed in multi-disciplinary team meeting Observation Level : q15 minute checks Vital signs: q12 hours Precautions: Safety   I certify that inpatient services furnished can reasonably be expected to  improve the patient's condition.     Nicholes Rough, NP, pmhnp. 06/28/2022, 2:49 PMPatient ID: [name], male   DOB: 07-Feb-1966, 57 y.o.

## 2022-06-28 NOTE — BHH Counselor (Signed)
[facility] LCSW Progress Note   06/28/2022    4:03 PM  Benjamin [name]      Type of Note: Family Meeting  CSW and NP went to patient room to have family meeting with wife via phone. Np and CSW gave wife an update on pt stability, expected DC for tomorrow, and follow up appointments. Wife did not agree with DC stating that patient did not need to be release due to having SI, with no intent that he shared with her. However, NP explained that based on her encounter with patient in the past 48-HR patient was clear headed with no issues or reports of having SI . Overall meeting did not go well , wife was not accepting that of pt expected released date of tomorrow. Patient stated that he was ready for DC and then wife said, " no you are not speaking clearly babe". Patient got aggravated with wife and stated that he would call her back and tell her about herself; NP advised pt not to reach back out right now to wife and to take some breathes or walk instead of being in his room with his mind wondering and pt said that he would do that.   Afterwards CSW call [facility] with pt permission and spoke with [name] Personnel officer) who stated that the offer for patient to come to the clinical program will always be on the table since he is an alumni, so pt can go anytime. CSW did mention to [name] that IOP was mentioned by NP and [name] said that IOP would be a good start for patient to try new treatment to connect with local recoveries.    Signed:   Silas Flood, MSW, [facility] 06/28/2022 4:03 PM

## 2022-06-28 NOTE — Plan of Care (Signed)
  Problem: Education: Goal: Knowledge of [place] [facility] Education information/materials will improve Outcome: Progressing Goal: Mental status will improve Outcome: Progressing Goal: Verbalization of understanding the information provided will improve Outcome: Progressing   Problem: Activity: Goal: Imbalance in normal sleep/wake cycle will improve Outcome: Progressing   Problem: Self-Concept: Goal: Will verbalize positive feelings about self Outcome: Progressing   Problem: Safety: Goal: Ability to identify and utilize support systems that promote safety will improve Outcome: Progressing   Problem: Self-Concept: Goal: Level of anxiety will decrease Outcome: Progressing

## 2022-06-28 NOTE — Progress Notes (Signed)
D: Patient is alert, oriented, pleasant, and cooperative. Denies SI, HI, AVH, and verbally contracts for safety. Patient reports thoughts of suicide upon waking this morning but that these thoughts were gone by the time this RN assessed him. He reports chronic low back pain.   A: Scheduled medications administered per MD order. PRN norco administered. Support provided. Patient educated on safety on the unit and medications. Routine safety checks every 15 minutes. Patient stated understanding to tell nurse about any new physical symptoms. Patient understands to tell staff of any needs.     R: No adverse drug reactions noted. Patient verbally contracts for safety. Patient remains safe at this time and will continue to monitor.    06/28/22 0800  Psych Admission Type (Psych Patients Only)  Admission Status Voluntary  Psychosocial Assessment  Patient Complaints Anxiety;Depression  Eye Contact Fair  Facial Expression Anxious;Sad  Affect Depressed  Speech Logical/coherent  Interaction Assertive  Motor Activity Slow  Appearance/Hygiene Unremarkable  Behavior Characteristics Cooperative;Appropriate to situation  Mood Depressed;Anxious  Thought Process  Coherency WDL  Content WDL  Delusions None reported or observed  Perception WDL  Hallucination None reported or observed  Judgment Impaired  Confusion None  Danger to Self  Current suicidal ideation? Denies  Self-Injurious Behavior No self-injurious ideation or behavior indicators observed or expressed   Agreement Not to Harm Self Yes  Description of Agreement verbal  Danger to Others  Danger to Others None reported or observed

## 2022-06-28 NOTE — Progress Notes (Signed)
   06/28/22 [id number]  15 Minute Checks  Location Hallway  Visual Appearance Calm  Behavior Composed  Sleep (Behavioral Health Patients Only)  Calculate sleep? (Click Yes once per 24 hr at 0600 safety check) Yes  Documented sleep last 24 hours 6.75

## 2022-06-28 NOTE — Progress Notes (Signed)
[facility] MD Progress Note  06/28/2022 9:29 AM Jakaiden Jabs  MRN:  [id number]  Principal Problem: MDD (major depressive disorder), recurrent severe, without psychosis  Diagnosis: Principal Problem:   MDD (major depressive disorder), recurrent severe, without psychosis Active Problems:   Essential hypertension, benign   Insomnia   Cocaine use disorder   Chronic pain syndrome   GAD (generalized anxiety disorder)  Reason for admission: Patient reports that "Last Saturday, I blacked out.", but then goes on to recount the series of events that happened during the time of his black out, as quoted above under "Chief compliant", and states that he remembers the details.  He states that Xanax which was a new prescription which he had taken for a month prior to the events above, triggered the episode that he had leading up to this hospitalization.  He admits to feeling suicidal during that time, states that he was on "a suicidal mission", reports that he made a cut to his left hand with a piece of glass that he found, and is observed to have a deep cut to his left thumb area.  Orders placed for bacitracin ointment, and for nursing to cleanse the area with normal saline solution, apply bacitracin ointment to area, prior to covering with a nonadherent dressing or Band-Aid. He states that a tetanus vaccine was administered to him in the ER.   24-hour chart Review: Past 24 hours of patient's chart was reviewed.  Patient is compliant with scheduled meds. Required PRNs: Hydrocodone and hydroxyzine Per RN notes, no documented behavioral issues and is continuing to be in attendance of some group sessions.  Daily notes, 06/27/2022: During this encounter, pt is seen in his room lying in bed, stated that he was up earlier, but came back to lie in bed due to pain. He presents today with a less depressed mood than yesterday, and verbalizes feeling better and that his mood is better as compared yesterday. Pt's attention to  personal hygiene and grooming is fair, eye contact is good, speech is clear & coherent. Thought contents are organized and logical. During this encounter, pt  denies SI/HI/AVH or paranoia. There is no evidence of delusional thoughts.    Pt perseverates about the "[facility]" program, states that the program manager at that program reached out to his wife and told her that they would make accommodations for him to attend the program due to his mobility issues. Pt stated that he would call the program back and let staff know once he is able to get an accurate understanding of what modifications they will be making. Pt also requested a family meeting with CSW and Probation officer to speak with his wife about his medications, and outpatient f/u. Writer and CSW went to talk to pt and wife, and call was placed to wife, but she stated that she was preparing for a doctor's appointment. Writer and CSW stated that we would call her back tomorrow (06/28/22) and talk to answer any questions that she might have for about 5-10 minutes. She verbalized understanding and call ended.  Pt reported that he slept well, but had some difficulty staying asleep,and also stated that his appetite is improving. He reported that his pain was more under control, and was educated that a tentative discharge date for him would be 4/4, even though he was opposed to this, but did not give any rationale as to why he wanted to remain hospitalized. We will continue medications as below and revisit discharge planning tomorrow.  Total Time  spent with patient: 45 minutes  Past Psychiatric History:  Previous Psych Diagnoses: MDD, polysubstance abuse Prior inpatient treatment: [facility] admissions on 08/13/15, 08/24/15, 11/13/17 & 11/26/17. Current/prior outpatient treatment: "Dr Sherrie Mustache" Prior rehab [id number]  Psychotherapy [id number] having one currently  History of suicide attempts: Multiple via cutting since childhood as per patient and via sitting on railroad tracks  while living in [place]   Past Medical History:  Past Medical History:  Diagnosis Date   Chronic back pain    managed by [facility]   CKD (chronic kidney disease), stage III ([place]) 10/2017   Erectile dysfunction    History of substance abuse ([place])    Hx of suicide attempt    Hypertension     Past Surgical History:  Procedure Laterality Date   ANKLE SURGERY     right   APPLICATION OF WOUND VAC Right 08/17/2015   Procedure: APPLICATION OF WOUND VAC;  Surgeon: Roseanne Kaufman, MD;  Location: [place];  Service: Orthopedics;  Laterality: Right;   HAND SURGERY     fracture, ORIG, teenage years, right.   I & D EXTREMITY Right 08/10/2015   Procedure: IRRIGATION AND DEBRIDEMENT SKIN, SUBCUTANEOS TISSUE AND  MUSCLE, CARPAL TUNNEL RELEASE, MEDIAN NERVE LYSIS WITH NERVE WRAP;  Surgeon: Roseanne Kaufman, MD;  Location: [place];  Service: Orthopedics;  Laterality: Right;   INCISION AND DRAINAGE OF WOUND Right 08/15/2015   Procedure: IRRIGATION AND DEBRIDEMENT WOUND;  Surgeon: Justice Britain, MD;  Location: [facility];  Service: Orthopedics;  Laterality: Right;   LUMBAR EPIDURAL INJECTION     [facility]   SKIN SPLIT GRAFT Right 08/17/2015   Procedure: I&D RIGHT FOREARM/POSSIBLE SKIN GRAFT;  Surgeon: Roseanne Kaufman, MD;  Location: [place];  Service: Orthopedics;  Laterality: Right;   TENDON REPAIR N/A 07/24/2015   Procedure: WRIST LACERATION AND TENDON REPAIR;  Surgeon: Iran Planas, MD;  Location: [place];  Service: Orthopedics;  Laterality: N/A;   WOUND EXPLORATION Right 08/15/2015   Procedure: WOUND EXPLORATION right forearm;  Surgeon: Justice Britain, MD;  Location: [facility];  Service: Orthopedics;  Laterality: Right;   Family History:  Family History  Problem Relation Age of Onset   Cancer Mother        lung, smoker   Anxiety disorder Mother    Cancer Father        throat   Diabetes Paternal Grandmother    Cancer Paternal Aunt    Colon cancer Paternal Aunt    Heart disease Neg Hx     Stroke Neg Hx    Esophageal cancer Neg Hx    Inflammatory bowel disease Neg Hx    Liver disease Neg Hx    Pancreatic cancer Neg Hx    Rectal cancer Neg Hx    Stomach cancer Neg Hx    Family Psychiatric  History:  Mother with MDD, attempted suicide multiple times, didn't succeed, but, but died of cancer. Psych [id number] SA/HA:mother, but didn't complete Substance use family [id number]   Social History:  Social History   Substance and Sexual Activity  Alcohol Use Yes   Alcohol/week: 0.0 standard drinks of alcohol   Comment: occasional     Social History   Substance and Sexual Activity  Drug Use Yes   Types: Marijuana, Cocaine, Heroin   Comment: last use cocaine 08/08/15, last use MJ 08/08/15    Social History   Socioeconomic History   Marital status: Single    Spouse name: Not on file   Number  of children: 0   Years of education: Not on file   Highest education level: Not on file  Occupational History   Occupation: unemployed  Tobacco Use   Smoking status: Former    Packs/day: 0.50    Years: 6.00    Additional pack years: 0.00    Total pack years: 3.00    Types: Cigarettes   Smokeless tobacco: Never  Vaping Use   Vaping Use: Never used  Substance and Sexual Activity   Alcohol use: Yes    Alcohol/week: 0.0 standard drinks of alcohol    Comment: occasional   Drug use: Yes    Types: Marijuana, Cocaine, Heroin    Comment: last use cocaine 08/08/15, last use MJ 08/08/15   Sexual activity: Yes  Other Topics Concern   Not on file  Social History Narrative   Single,prior worked as Teacher, 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   No children.  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   09/2017.    Social Determinants of Health   Financial Resource Strain: Not on file  Food Insecurity: Not on file  Transportation Needs: Not on file  Physical Activity: Not on file  Stress: Not on file  Social Connections: Not on file   Additional Social History:     Sleep: Fair  Appetite:  Good  Current Medications: Current Facility-Administered Medications  Medication Dose Route Frequency Provider Last Rate Last Admin   acetaminophen (TYLENOL) tablet 650 mg  650 mg Oral Q6H PRN Vesta Mixer, NP   650 mg at 06/21/22 0254   alum & mag hydroxide-simeth (MAALOX/MYLANTA) 200-200-20 MG/5ML suspension 30 mL  30 mL Oral Q4H PRN Vesta Mixer, NP       amLODipine (NORVASC) tablet 10 mg  10 mg Oral Daily Scarleth Brame, NP   10 mg at 06/28/22 [id number]   And   irbesartan (AVAPRO) tablet 300 mg  300 mg Oral Daily Nicholes Rough, NP   300 mg at 06/28/22 0747   bacitracin ointment   Topical BID Janine Limbo, MD   Given at 06/28/22 0747   buPROPion (WELLBUTRIN XL) 24 hr tablet 300 mg  300 mg Oral Daily Nicholes Rough, NP   300 mg at 06/28/22 0747   cephALEXin (KEFLEX) capsule 500 mg  500 mg Oral Q12H Nw[place], Herbert Pun I, NP   500 mg at 06/28/22 0747   haloperidol (HALDOL) tablet 5 mg  5 mg Oral TID PRN Vesta Mixer, NP       Or   haloperidol lactate (HALDOL) injection 5 mg  5 mg Intramuscular TID PRN Vesta Mixer, NP       HYDROcodone-acetaminophen (NORCO) 10-325 MG per tablet 1 tablet  1 tablet Oral TID PRN Ranae Palms, MD   1 tablet at 06/28/22 [id number]   hydrOXYzine (ATARAX) tablet 25 mg  25 mg Oral TID PRN Vesta Mixer, NP   25 mg at 06/22/22 1956   LORazepam (ATIVAN) injection 2 mg  2 mg Intramuscular TID PRN Vesta Mixer, NP       magnesium hydroxide (MILK OF MAGNESIA) suspension 30 mL  30 mL Oral Daily PRN Vesta Mixer, NP       melatonin tablet 6 mg  6 mg Oral QHS Naomi Fitton, NP   6 mg at 06/27/22 2151   mirtazapine (REMERON) tablet 7.5 mg  7.5 mg Oral QHS Pamelyn Bancroft, NP   7.5 mg at 06/27/22 2152   multivitamin with minerals tablet 1 tablet  1 tablet Oral Daily Nicholes Rough, NP  1 tablet at 06/28/22 0747   rosuvastatin (CRESTOR) tablet 10 mg  10 mg Oral Daily Nicholes Rough, NP   10 mg at 06/28/22 0747   thiamine  (Vitamin B-1) tablet 100 mg  100 mg Oral Daily Nicholes Rough, NP   100 mg at 06/28/22 0747   thiamine (VITAMIN B1) injection 100 mg  100 mg Intramuscular Once Nicholes Rough, NP        Lab Results:  No results found for this or any previous visit (from the past 1 hour(s)).  Blood Alcohol level:  Lab Results  Component Value Date   ETH <10 06/20/2022   ETH <10 [id number]   Metabolic Disorder Labs: Lab Results  Component Value Date   HGBA1C 5.6 08/31/2016   MPG 114 08/31/2016   MPG 120 (H) 05/24/2015   No results found for: "PROLACTIN" Lab Results  Component Value Date   CHOL 249 (H) 03/04/2018   TRIG 137 03/04/2018   HDL 52 03/04/2018   CHOLHDL 4.8 03/04/2018   VLDL 33 (H) 08/31/2016   LDLCALC 170 (H) 03/04/2018   LDLCALC 159 (H) 10/08/2017   Physical Findings: AIMS:  , ,  ,  ,    CIWA:  CIWA-Ar Total: 0 COWS:     Musculoskeletal: Strength & Muscle Tone: within normal limits Gait & Station: unsteady Patient leans:  Walks with a rolling walker  Psychiatric Specialty Exam:  Presentation  General Appearance:  Appropriate for Environment; Fairly Groomed  Eye Contact: Good  Speech: Clear and Coherent  Speech Volume: Normal  Handedness: Right  Mood and Affect  Mood: Depressed; Anxious  Affect: Congruent  Thought Process  Thought Processes: Coherent  Descriptions of Associations:Intact  Orientation:Full (Time, Place and Person)  Thought Content:Logical  History of Schizophrenia/Schizoaffective disorder:No data recorded Duration of Psychotic Symptoms:No data recorded Hallucinations:No data recorded   Ideas of Reference:None  Suicidal Thoughts:No data recorded   Homicidal Thoughts:No data recorded   Sensorium  Memory: Immediate Good  Judgment: Fair  Insight: Fair  Community education officer  Concentration: Good  Attention Span: Good  Recall: Good  Fund of Knowledge: Good  Language: Good  Psychomotor Activity   Psychomotor Activity: No data recorded   Assets  Assets: Communication Skills  Sleep  Sleep: No data recorded   Physical Exam: Physical Exam Vitals and nursing note reviewed.  HENT:     Head: Normocephalic.     Nose: Nose normal.     Mouth/Throat:     Mouth: Mucous membranes are moist.     Pharynx: Oropharynx is clear.  Eyes:     Conjunctiva/sclera: Conjunctivae normal.     Pupils: Pupils are equal, round, and reactive to light.  Cardiovascular:     Rate and Rhythm: Normal rate.     Pulses: Normal pulses.     Comments: Blood pressure 143/100 pulse 69.  Patient asymptomatic.  Nursing staff to recheck vital signs. Abdominal:     Palpations: Abdomen is soft.  Genitourinary:    Comments: Deferred Musculoskeletal:        General: Normal range of motion.     Cervical back: Normal range of motion.  Skin:    General: Skin is warm.  Neurological:     General: No focal deficit present.     Mental Status: He is alert and oriented to person, place, and time.  Psychiatric:        Behavior: Behavior normal.    Review of Systems  Constitutional: Negative.   HENT: Negative.    Eyes:  Negative.   Respiratory: Negative.    Cardiovascular: Negative.   Gastrointestinal: Negative.   Genitourinary: Negative.   Musculoskeletal: Negative.   Skin: Negative.   Neurological: Negative.   Endo/Heme/Allergies: Negative.   Psychiatric/Behavioral:  Positive for depression and substance abuse. Negative for hallucinations and suicidal ideas. The patient is nervous/anxious and has insomnia.    Blood pressure (!) 139/96, pulse 88, temperature 98.4 F (36.9 C), temperature source Oral, resp. rate 16, height 6\' 2"  (1.88 m), weight 101.1 kg, SpO2 100 %. Body mass index is 28.61 kg/m.  Treatment Plan Summary: Diagnoses:  Principal Problem:   MDD (major depressive disorder), recurrent severe, without psychosis ([place]) Active Problems:   Essential hypertension, benign   Insomnia   Cocaine  use disorder ([facility])   Chronic pain syndrome   GAD (generalized anxiety disorder)   Medications -Continue Remeron 7.5 mg nightly for sleep -Continue Wellbutrin XR to 300  mg daily for depressive symptoms (Home med) -Continue Norco 1tab Q 8 H PRN for pain (home med) -Continue Avapro 300 mg/Norvasc 10mg   daily for htn (home med) -Continue Hydroxyzine 25 mg TID PRN for anxiety -Continue Melatonin to 6 mg nightly for sleep -Continue Crestor 10 mg daily for hyperlipidemia (home med) -Continue Ativan 1mg  Q6H PRN for CIWA scores >10 -Continue agitation protocol Zyprexa/Ativan/Geodon PRN.  -Continue Keflex 500 mg po bid x 7 days for wound infection.   PRNS -Continue Tylenol 650 mg every 6 hours PRN for mild pain -Continue Maalox 30 mg every 4 hrs PRN for indigestion -Continue Imodium 2-4 mg as needed for diarrhea -Continue Milk of Magnesia as needed every 6 hrs for constipation -Continue Zofran disintegrating tabs every 6 hrs PRN for nausea    Discharge Planning: Social work and case management to assist with discharge planning and identification of hospital follow-up needs prior to discharge Estimated LOS: 5-7 days Discharge Concerns: Need to establish a safety plan; Medication compliance and effectiveness Discharge Goals: Return home with outpatient referrals for mental health follow-up including medication management/psychotherapy  Safety and Monitoring: Voluntary admission to inpatient psychiatric unit for safety, stabilization and treatment Daily contact with patient to assess and evaluate symptoms and progress in treatment Patient's case to be discussed in multi-disciplinary team meeting Observation Level : q15 minute checks Vital signs: q12 hours Precautions: Safety   I certify that inpatient services furnished can reasonably be expected to improve the patient's condition.     Nicholes Rough, NP, pmhnp. 06/28/2022, 9:29 AMPatient ID: [name], male   DOB: 1965/08/01, 57  y.o.Patient ID: Peja Hartter, male   DOB: 1965/06/22, 57 y.o.   MRN: [id number]

## 2022-06-28 NOTE — [facility] Group Notes (Signed)
[place] Group Notes:  (Nursing/MHT/Case Management/Adjunct)  Date:  06/28/2022  Time:  4:18 PM  Type of Therapy:  Group Therapy  Participation Level:  Minimal  Participation Quality:  Appropriate  Affect:  Appropriate  Cognitive:  Appropriate  Insight:  Lacking  Engagement in Group:  Lacking  Modes of Intervention:  Discussion  Summary of Progress/Problems:  Benjamin [name] 06/28/2022, 4:18 PM

## 2022-06-29 DIAGNOSIS — Z23 Encounter for immunization: Secondary | ICD-10-CM | POA: Diagnosis present

## 2022-06-29 DIAGNOSIS — F332 Major depressive disorder, recurrent severe without psychotic features: Secondary | ICD-10-CM | POA: Diagnosis not present

## 2022-06-29 MED ORDER — BACITRACIN ZINC 500 UNIT/GM EX OINT: TOPICAL_OINTMENT | Freq: Two times a day (BID) | CUTANEOUS | 0 refills | Status: DC

## 2022-06-29 MED ORDER — MIRTAZAPINE 7.5 MG PO TABS: 7.5000 mg | ORAL_TABLET | Freq: Every day | ORAL | 0 refills | Status: AC

## 2022-06-29 MED ORDER — AMLODIPINE BESYLATE 10 MG PO TABS: 10.0000 mg | ORAL_TABLET | Freq: Every day | ORAL | 0 refills | Status: DC

## 2022-06-29 MED ORDER — HYDROXYZINE HCL 25 MG PO TABS: 25.0000 mg | ORAL_TABLET | Freq: Three times a day (TID) | ORAL | 0 refills | Status: AC | PRN

## 2022-06-29 MED ORDER — CEPHALEXIN 500 MG PO CAPS: 500.0000 mg | ORAL_CAPSULE | Freq: Two times a day (BID) | ORAL | 0 refills | Status: DC

## 2022-06-29 MED ORDER — MELATONIN 3 MG PO TABS: 6.0000 mg | ORAL_TABLET | Freq: Every day | ORAL | 0 refills | Status: AC

## 2022-06-29 MED ORDER — IRBESARTAN 300 MG PO TABS: 300.0000 mg | ORAL_TABLET | Freq: Every day | ORAL | 0 refills | Status: DC

## 2022-06-29 MED ORDER — BUPROPION HCL ER (XL) 300 MG PO TB24: 300.0000 mg | ORAL_TABLET | Freq: Every day | ORAL | 0 refills | Status: AC

## 2022-06-29 NOTE — Discharge Summary (Signed)
Physician Discharge Summary Note  Patient:  Benjamin [name] is an 57 y.o., male  MRN:  [id number]  DOB:  1966/03/21  Patient phone:  [phone] (home)   Patient address:   [address] [place] [address],   Total Time spent with patient:  Greater than 30 minutes  Date of Admission:  06/21/2022  Date of Discharge: 06-29-22  Reason for Admission: Patient found by a roadside, verbalizing suicidal ideations after making a deep cut to his left thumb area.   Principal Problem: MDD (major depressive disorder), recurrent severe, without psychosis  Discharge Diagnoses: Principal Problem:   MDD (major depressive disorder), recurrent severe, without psychosis Active Problems:   Essential hypertension, benign   Insomnia   Cocaine use disorder   Chronic pain syndrome   GAD (generalized anxiety disorder)  Past Psychiatric History: MDD, GAD, stimulant use disorder.  Past Medical History:  Past Medical History:  Diagnosis Date   Chronic back pain    managed [facility]   CKD (chronic kidney disease), stage III (HCC) 10/2017   Erectile dysfunction    History of substance abuse (HCC)    Hx of suicide attempt    Hypertension     Past Surgical History:  Procedure Laterality Date   ANKLE SURGERY     right   APPLICATION OF WOUND VAC Right 08/17/2015   Procedure: APPLICATION OF WOUND VAC;  Surgeon: Dominica Severin, MD;  Location: [facility] OR;  Service: Orthopedics;  Laterality: Right;   HAND SURGERY     fracture, ORIG, teenage years, right.   I & D EXTREMITY Right 08/10/2015   Procedure: IRRIGATION AND DEBRIDEMENT SKIN, SUBCUTANEOS TISSUE AND  MUSCLE, CARPAL TUNNEL RELEASE, MEDIAN NERVE LYSIS WITH NERVE WRAP;  Surgeon: Dominica Severin, MD;  Location: [facility] OR;  Service: Orthopedics;  Laterality: Right;   INCISION AND DRAINAGE OF WOUND Right 08/15/2015   Procedure: IRRIGATION AND DEBRIDEMENT WOUND;  Surgeon: Francena Hanly, MD;  Location: [facility];  Service: Orthopedics;   Laterality: Right;   LUMBAR EPIDURAL INJECTION     [facility]   SKIN SPLIT GRAFT Right 08/17/2015   Procedure: I&D RIGHT FOREARM/POSSIBLE SKIN GRAFT;  Surgeon: Dominica Severin, MD;  Location: [facility];  Service: Orthopedics;  Laterality: Right;   TENDON REPAIR N/A 07/24/2015   Procedure: WRIST LACERATION AND TENDON REPAIR;  Surgeon: Bradly Bienenstock, MD;  Location: [facility];  Service: Orthopedics;  Laterality: N/A;   WOUND EXPLORATION Right 08/15/2015   Procedure: WOUND EXPLORATION right forearm;  Surgeon: Francena Hanly, MD;  Location: [facility];  Service: Orthopedics;  Laterality: Right;   Family History:  Family History  Problem Relation Age of Onset   Cancer Mother        lung, smoker   Anxiety disorder Mother    Cancer Father        throat   Diabetes Paternal Grandmother    Cancer Paternal Aunt    Colon cancer Paternal Aunt    Heart disease Neg Hx    Stroke Neg Hx    Esophageal cancer Neg Hx    Inflammatory bowel disease Neg Hx    Liver disease Neg Hx    Pancreatic cancer Neg Hx    Rectal cancer Neg Hx    Stomach cancer Neg Hx    Family Psychiatric  History: See H&P.  Social History:  Social History   Substance and Sexual Activity  Alcohol Use Yes   Alcohol/week: 0.0 standard drinks of alcohol   Comment: occasional  Social History   Substance and Sexual Activity  Drug Use Yes   Types: Marijuana, Cocaine, Heroin   Comment: last use cocaine 08/08/15, last use MJ 08/08/15    Social History   Socioeconomic History   Marital status: Single    Spouse name: Not on file   Number of children: 0   Years of education: Not on file   Highest education level: Not on file  Occupational History   Occupation: unemployed  Tobacco Use   Smoking status: Former    Packs/day: 0.50    Years: 6.00    Additional pack years: 0.00    Total pack years: 3.00    Types: Cigarettes   Smokeless tobacco: Never  Vaping Use   Vaping Use: Never used  Substance and Sexual Activity    Alcohol use: Yes    Alcohol/week: 0.0 standard drinks of alcohol    Comment: occasional   Drug use: Yes    Types: Marijuana, Cocaine, Heroin    Comment: last use cocaine 08/08/15, last use MJ 08/08/15   Sexual activity: Yes  Other Topics Concern   Not on file  Social History Narrative   Single,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   No children.  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   09/2017.    Social Determinants of Health   Financial Resource Strain: Not on file  Food Insecurity: Not on file  Transportation Needs: Not on file  Physical Activity: Not on file  Stress: Not on file  Social Connections: Not on file   Hospital Course: (Per admission evaluation notes): Benjamin [name] is a 57 yo [place] American male with a history of MDD and polysubstance use who was taken to the [facility] ED by EMS after being found by a roadside, verbalizing suicidal ideations after making a deep cut to his left thumb area. Pt was medically treated and cleared prior to being transferred voluntarily to this [facility] for treatment and stabilization of his mental status.  CC:" Last Saturday, I blacked out. I did not know who I was. I wasn't me, I just left the house and I walked and walked, and got on a bus, got off the bus, got on another bus, got off again, met some guys, bought some beer, bought some cigarettes, some drugs, and used them. I was on a suicide mission. I found some glass and cut myself before I called my wife".  Prior to this discharge, Benjamin [name] was seen & evaluated for mental health stability. The current laboratory findings were reviewed (stable), The nurses notes & vital signs were reviewed as well. There are no current mental health or medical issues that should prevent this discharge at this time. Patient is being discharged to continue mental health care & medication management as noted below.   After his admission evaluation by his treatment team,  Benjamin [name] was recommended for mood stabilization treatments. The medication regimen for his presenting symptoms were discussed with him & with his consent initiated. The use/side effects of the medication in use were explained to him. He was given an ample amount of time to ask questions that he may have at any time during his hospital stay/duration of treatments. He received & was stabilized on the medications as listed below on his discharge medication lists. Patient did present other pre-existing medical issues (chronic pain, hyperlipidemia, HTN) that required treatments. He was resumed & discharged on all the medications used for those medical issues. He tolerated  his treatment regimen without any adverse effects or reactions reported.   Benjamin [name]'s symptoms responded well to his treatment regimen warranting this discharge. This is evidenced by his reports of improved symptoms & absence of suicidal ideations. He is also medically/mentally stable & agreeable to this discharge. However, during the safety plan with patient's wife. Patient's wife expressed her concerns that her husband does not seem stable enough for discharge. However, patient expressed that his wife wants him to go from the hospital to a residential treatment center because of his chronic use of opioid medications. He says he is on the opioid medication for his chronic pain & he does not need substance abuse treatments. He also states that the program that his wife had in mind for him to attend will not not serve him well because the environment is not conducive for his comfort. Patient says he plans to go home after discharge & continue his pain management with his orthopedic doctor.   And because Benjamin [name] is seen today by his treatment team for discharge evaluation. He stated that he is pleased that he sought help for his mental health issues. Says he is tolerating his medications well. He says he is no longer feeling depressed or anxious. He describes  normal energy & ability to think clearly & stay focus on task. He denies any suicidal/homicidal thoughts. Denies any thoughts of violence. Patient reports normal biological functions.    The nursing staff reports that patient has been appropriate on the unit. Patient has been interacting well with peers & staff. No behavioral issues reported. Patient has not voiced any suicidal thoughts. Patient has not been observed to be internally stimulated or pre-occupied. Patient has been adherent with treatment recommendations. Patient has been tolerating his medication well, denies any adverse reactions or side effects.    Patient was discussed at the team meeting this morning. Team members feel that patient is back to his baseline level of function. He has met the maximum benefit of this hospitalization. The Team agrees with plan to discharge patient today to continue mental health care on outpatient basis as noted below. Upon discharge, Benjamin [name] adamantly denies any SIHI, AVH, delusional thoughts or paranoia. He does not appear to be responding to any internal stimuli. He will continue further psychiatric follow-up care/medication management on an outpatient basis as noted below. He was provided with all the necessary information needed to make this appointment without problems. He was able to engage in safety planning including plan to return to [facility] or contact emergency services if he feels unable to maintain his own safety or the safety of others. Pt had no further questions, comments, or concerns.   He left [facility] with all personal belongings in no apparent distress. Transportation per family (girlfriend/wife).  Physical Findings: AIMS:  , ,  ,  ,    CIWA:  CIWA-Ar Total: 0 COWS:     Musculoskeletal: Strength & Muscle Tone: within normal limits Gait & Station: normal Patient leans: N/A  Psychiatric Specialty Exam:  Presentation  General Appearance:  Appropriate for Environment; Casual; Fairly Groomed  Eye  Contact: Good  Speech: Clear and Coherent; Normal Rate  Speech Volume: Normal  Handedness: Right  Mood and Affect  Mood: Euthymic  Affect: Appropriate; Congruent  Thought Process  Thought Processes: Coherent; Goal Directed; Linear  Descriptions of Associations:Intact  Orientation:Full (Time, Place and Person)  Thought Content:Logical  History of Schizophrenia/Schizoaffective disorder:No data recorded Duration of Psychotic Symptoms:No data recorded Hallucinations:Hallucinations: None  Ideas of Reference:None  Suicidal Thoughts:Suicidal Thoughts: No SI Active Intent and/or Plan: Without Intent; Without Plan; Without Means to Carry Out; Without Access to Means SI Passive Intent and/or Plan: Without Intent; Without Plan; Without Means to Carry Out; Without Access to Means  Homicidal Thoughts:Homicidal Thoughts: No  Sensorium  Memory: Immediate Good; Recent Good; Remote Good  Judgment: Good  Insight: Good; Present  Executive Functions  Concentration: Good  Attention Span: Good  Recall: Good  Fund of Knowledge: Good  Language: Good  Psychomotor Activity  Psychomotor Activity: Psychomotor Activity: Normal  Assets  Assets: Communication Skills; Desire for Improvement; Financial Resources/Insurance; Housing; Physical Health; Resilience; Social Support  Sleep  Sleep: Sleep: Good Number of Hours of Sleep: 7.5  Physical Exam: Physical Exam Vitals and nursing note reviewed.  HENT:     Head: Normocephalic.     Nose: Nose normal.     Mouth/Throat:     Pharynx: Oropharynx is clear.  Eyes:     Pupils: Pupils are equal, round, and reactive to light.  Cardiovascular:     Rate and Rhythm: Normal rate.     Pulses: Normal pulses.  Pulmonary:     Effort: Pulmonary effort is normal.  Genitourinary:    Comments: Deferred Musculoskeletal:        General: Normal range of motion.     Cervical back: Normal range of motion.  Skin:    General:  Skin is warm and dry.  Neurological:     General: No focal deficit present.     Mental Status: He is alert and oriented to person, place, and time. Mental status is at baseline.    Review of Systems  Constitutional:  Negative for chills, diaphoresis, fever and malaise/fatigue.  HENT:  Negative for congestion and sore throat.   Eyes:  Negative for blurred vision.  Respiratory:  Negative for cough, shortness of breath and wheezing.   Cardiovascular:  Negative for chest pain and palpitations.  Gastrointestinal:  Negative for abdominal pain, constipation, diarrhea, heartburn, nausea and vomiting.  Genitourinary:  Negative for dysuria.  Musculoskeletal:  Positive for joint pain. Negative for myalgias.  Skin:  Negative for itching and rash.  Neurological:  Negative for dizziness, tingling, tremors, sensory change, speech change, focal weakness, seizures, loss of consciousness, weakness and headaches.  Endo/Heme/Allergies:        NKDA  Psychiatric/Behavioral:  Positive for depression (Hx of (stable on medication).) and substance abuse (Hx stimulant (cocaine) use disorder, chronic use of opioid drugs for pain.). Negative for hallucinations, memory loss and suicidal ideas (Hx. chronic suicidality, currently stable upon discharge.). The patient has insomnia (Hx of (stable upon discharge.). The patient is not nervous/anxious (Stable upon discharge.).    Blood pressure (!) 116/96, pulse 84, temperature 97.7 F (36.5 C), temperature source Oral, resp. rate 16, height  (1.88 m), weight 101.1 kg, SpO2 100 %. Body mass index is 28.61 kg/m.  Social History   Tobacco Use  Smoking Status Former   Packs/day: 0.50   Years: 6.00   Additional pack years: 0.00   Total pack years: 3.00   Types: Cigarettes  Smokeless Tobacco Never   Tobacco Cessation:  N/A, patient does not currently use tobacco products  Blood Alcohol level:  Lab Results  Component Value Date   ETH <10 06/20/2022   ETH <10  11/22/2017   Metabolic Disorder Labs:  Lab Results  Component Value Date   HGBA1C 5.6 08/31/2016   MPG 114 08/31/2016   MPG 120 (H) 05/24/2015   No results  found for: "PROLACTIN" Lab Results  Component Value Date   CHOL 249 (H) 03/04/2018   TRIG 137 03/04/2018   HDL 52 03/04/2018   CHOLHDL 4.8 03/04/2018   VLDL 33 (H) 08/31/2016   LDLCALC 170 (H) 03/04/2018   LDLCALC 159 (H) 10/08/2017   See Psychiatric Specialty Exam and Suicide Risk Assessment completed by Attending Physician prior to discharge.  Discharge destination:  Home  Is patient on multiple antipsychotic therapies at discharge:  No   Has Patient had three or more failed trials of antipsychotic monotherapy by history:  No  Recommended Plan for Multiple Antipsychotic Therapies: NA  Allergies as of 06/29/2022   No Known Allergies      Medication List     STOP taking these medications    ALPRAZolam 0.5 MG tablet Commonly known as: XANAX   amLODipine-valsartan 5-160 MG tablet Commonly known as: EXFORGE   DULoxetine 60 MG capsule Commonly known as: CYMBALTA       TAKE these medications      Indication  amLODipine 10 MG tablet Commonly known as: NORVASC Take 1 tablet (10 mg total) by mouth daily. For high blood pressure Start taking on: June 30, 2022  Indication: High Blood Pressure Disorder   bacitracin ointment Apply topically 2 (two) times daily. (May buy from over the counter): Apply lightly once a day: For  wound care  Indication: Wound care   buPROPion 300 MG 24 hr tablet Commonly known as: WELLBUTRIN XL Take 1 tablet (300 mg total) by mouth daily. For depression What changed: additional instructions  Indication: Major Depressive Disorder   cephALEXin 500 MG capsule Commonly known as: KEFLEX Take 1 capsule (500 mg total) by mouth every 12 (twelve) hours. For wound infection  Indication: Bone and/or Joint Infection   HYDROcodone-acetaminophen 10-325 MG tablet Commonly known as:  NORCO Take 0.5-1 tablets by mouth every 8 (eight) hours as needed.  Indication: Pain   hydrOXYzine 25 MG tablet Commonly known as: ATARAX Take 1 tablet (25 mg total) by mouth 3 (three) times daily as needed for anxiety.  Indication: Feeling Anxious   irbesartan 300 MG tablet Commonly known as: AVAPRO Take 1 tablet (300 mg total) by mouth daily. For high blood pressure Start taking on: June 30, 2022  Indication: High Blood Pressure Disorder   melatonin 3 MG Tabs tablet Take 2 tablets (6 mg total) by mouth at bedtime. For sleep  Indication: Trouble Sleeping   mirtazapine 7.5 MG tablet Commonly known as: REMERON Take 1 tablet (7.5 mg total) by mouth at bedtime. For sleep/depression  Indication: de[ression/insomnia.   rosuvastatin 10 MG tablet Commonly known as: CRESTOR Take 10 mg by mouth daily.  Indication: High Amount of Fats in the Blood        Follow-up Information     Center, [facility]. Go on 07/11/2022.   Why: You have an appointment for therapy services on 07/11/22 at 4:00 pm.  This appointment will be held in person. Contact information: [address] [place], [place] [address] [phone]         [facility] Go on 07/21/2022.   Why: You have an appointment for medication management services on 07/21/22 at 11:00 am.   This appointment will be held in person. Contact information: [address] [place] [address] [phone]                Follow-up recommendations: Activity:  As tolerated Diet: As recommended by  your primary care doctor. Keep all scheduled follow-up appointments as recommended.   Comments: Comments: Patient is recommended to follow-up care on an outpatient basis as noted above. Prescriptions sent to pt's pharmacy of choice at discharge.   Patient agreeable to plan.   Given opportunity to ask questions.   Appears to feel comfortable with discharge denies any  current suicidal or homicidal thought. Patient is also instructed prior to discharge to: Take all medications as prescribed by his/her mental healthcare provider. Report any adverse effects and or reactions from the medicines to his/her outpatient provider promptly. Patient has been instructed & cautioned: To not engage in alcohol and or illegal drug use while on prescription medicines. In the event of worsening symptoms, patient is instructed to call the crisis hotline, 911 and or go to the nearest ED for appropriate evaluation and treatment of symptoms. To follow-up with his/her primary care provider for your other medical issues, concerns and or health care needs.  Signed: Armandina Stammer, NP, pmhnp, fnp-bc. 06/29/2022, 10:34 AM

## 2022-06-29 NOTE — Progress Notes (Signed)
Pt stated he was excited about D/C tomorrow, Probation officer talked to pt about things moving forward and pt appeared to listen to Probation officer and stated he was hopeful for the future    06/29/22 0000  Psych Admission Type (Psych Patients Only)  Admission Status Voluntary  Psychosocial Assessment  Patient Complaints Anxiety;Worrying  Eye Contact Fair  Facial Expression Anxious  Affect Depressed  Speech Logical/coherent  Interaction Assertive  Motor Activity Slow  Appearance/Hygiene Unremarkable  Behavior Characteristics Cooperative  Mood Depressed;Anxious  Aggressive Behavior  Effect No apparent injury  Thought Process  Coherency WDL  Content WDL  Delusions WDL  Perception WDL  Hallucination None reported or observed  Judgment Impaired  Confusion None  Danger to Self  Current suicidal ideation? Denies  Danger to Others  Danger to Others None reported or observed

## 2022-06-29 NOTE — Progress Notes (Signed)
Pt discharged to lobby. Pt was stable and appreciative at that time. All papers, samples and prescriptions were given and valuables returned. Verbal understanding expressed. Denies SI/HI and A/VH. Pt given opportunity to express concerns and ask questions.  

## 2022-06-29 NOTE — Progress Notes (Signed)
  [facility] Adult Case Management Discharge Plan :  Will you be returning to the same living situation after discharge:  Yes,  Patient will be returning back to home with significant other  At discharge, do you have transportation home?: Yes,  Significant other is picking patient up between 11AM-12PM Do you have the ability to pay for your medications: Yes,  patient has Medicare part A/B  Release of information consent forms completed and in the chart;  Patient's signature needed at discharge.  Patient to Follow up at:  Follow-up [facility]. Go on 07/11/2022.   Why: You have an appointment for therapy services on 07/11/22 at 4:00 pm.  This appointment will be held in person. Contact information: [place], [place], [place] [address] [phone]         [facility]. Go on 07/21/2022.   Why: You have an appointment for medication management services on 07/21/22 at 11:00 am.   This appointment will be held in person. Contact information: [place] [address] [phone]                 Next level of care provider has access to [facility] and Suicide Prevention discussed: C[place] Mulligan [phone]     Has patient been referred to the Quitline?: Patient refused referral  Patient has been referred for addiction treatment: Pt. refused referral. Patient and significant other will find own residential programing that best accommodates patient needs. CSW did provide patient with resources to help assist with search   Sherre Lain, LCSWA 06/29/2022, 11:12 AM

## 2022-06-29 NOTE — Progress Notes (Addendum)
Patient ID: Benjamin [name], male   DOB: Dec 24, 1965, 57 y.o.   MRN: [id number] Writer and CSW presented to pt's room for a family meeting via telephone with wife. CSW called, introduced herself and Probation officer, and meeting began with Probation officer explaining to pt's wife that pt would be discharged on 06/29/22, as he is currently stable enough mentally for care on the outpatient or rehab. Writer made attempts to educate wife about intensive outpatient programs as well as our outpatient follow up appointments that were already in place for pt. CSW also did same. Pt's wife however, became verbally very aggressive and dominated conversation, stating that she does not want pt to be discharged "because whenever he is discharged, he takes off". Writer explained that we are a crises stabilization unit, and we met pt in his crises, and he is currently ready for discharge. Wife remained belligerent, and angry, raising her voice at staff, talking over staff, not allowing staff input, did not also allow pt to have an input even though he was verbalizing readiness for discharge. Wife continued to tell him that he is not clear headed, even though in writer's interactions with pt, he has been oriented to person, place, situation  time since day of admission. He has also been rationale & logical during all interactions with him. Pt's wife perseverated over pt going to an "[employer]", and was overly focused on the program. Pt asked her if he could come home to get his other Physicians' appointments together prior to going for the program, but she declined stating that he has to go from [facility] to the program. Pt verbalized to Probation officer and CSW that his wife is overlybearing and he wants to be away from her. Empathy and active listening was provided to pt, but it was reiterated that he cannot remain hospitalized at this point.  Writer made it clear to patient and his wife that he will be discharged on 06/29/22, as there is no longer a need for  continuous hospitalization. Pt has made significant improvement since hospitalization. Writer admitted pt to the unit, and can attest to the progress that he has made so far. He was ambulatory with a walker, but is not able to walk by himself. Mood is currently euthymic, he is visible on the unit. no more med adjustments are being completed at this time, and pt is verbalizing readiness for discharge, and denying SI/HI/AVH. Writer educated wife and pt that after discharge, should pt experience a crises situation again, she or pt can call 25, 911 or take pt to the nearest emergency room or to the behavioral health urgent care.

## 2022-06-29 NOTE — [facility] Suicide Risk Assessment (Signed)
Suicide Risk Assessment  Discharge Assessment    [facility] Discharge Suicide Risk Assessment  Principal Problem: MDD (major depressive disorder), recurrent severe, without psychosis  Discharge Diagnoses: Principal Problem:   MDD (major depressive disorder), recurrent severe, without psychosis Active Problems:   Essential hypertension, benign   Insomnia   Cocaine use disorder   Chronic pain syndrome   GAD (generalized anxiety disorder)  Total Time spent with patient:  Greater than 30 minutes  Musculoskeletal: Strength & Muscle Tone: within normal limits Gait & Station: normal Patient leans: N/A  Psychiatric Specialty Exam  Presentation  General Appearance:  Appropriate for Environment; Casual; Fairly Groomed  Eye Contact: Good  Speech: Clear and Coherent; Normal Rate  Speech Volume: Normal  Handedness: Right  Mood and Affect  Mood: Euthymic  Duration of Depression Symptoms: No data recorded Affect: Appropriate; Congruent  Thought Process  Thought Processes: Coherent; Goal Directed; Linear  Descriptions of Associations:Intact  Orientation:Full (Time, Place and Person)  Thought Content:Logical  History of Schizophrenia/Schizoaffective disorder:No data recorded Duration of Psychotic Symptoms:No data recorded Hallucinations:Hallucinations: None  Ideas of Reference:None  Suicidal Thoughts:Suicidal Thoughts: No SI Active Intent and/or Plan: Without Intent; Without Plan; Without Means to Carry Out; Without Access to Means SI Passive Intent and/or Plan: Without Intent; Without Plan; Without Means to Carry Out; Without Access to Means  Homicidal Thoughts:Homicidal Thoughts: No  Sensorium  Memory: Immediate Good; Recent Good; Remote Good  Judgment: Good  Insight: Good; Present   Executive Functions  Concentration: Good  Attention Span: Good  Recall: Good  Fund of Knowledge: Good  Language: Good  Psychomotor Activity  Psychomotor  Activity: Psychomotor Activity: Normal  Assets  Assets: Communication Skills; Desire for Improvement; Financial Resources/Insurance; Housing; Physical Health; Resilience; Social Support  Sleep  Sleep: Sleep: Good Number of Hours of Sleep: 7.5  Physical Exam: Blood pressure (!) 116/96, pulse 84, temperature 97.7 F (36.5 C), temperature source Oral, resp. rate 16, height 6\' 2"  (1.88 m), weight 101.1 kg, SpO2 100 %. Body mass index is 28.61 kg/m.  Mental Status Per Nursing Assessment::   On Admission:  Suicidal ideation indicated by patient  Demographic Factors:  Male, Adolescent or young adult, and Low socioeconomic status  Loss Factors: NA  Historical Factors: NA  Risk Reduction Factors:   Sense of responsibility to family, Religious beliefs about death, Living with another person, especially a relative, Positive social support, Positive therapeutic relationship, and Positive coping skills or problem solving skills  Continued Clinical Symptoms:  Depression:   Comorbid alcohol abuse/dependence Insomnia Alcohol/Substance Abuse/Dependencies Chronic Pain Previous Psychiatric Diagnoses and Treatments Medical Diagnoses and Treatments/Surgeries  Cognitive Features That Contribute To Risk:  Closed-mindedness, Polarized thinking, and Thought constriction (tunnel vision)    Suicide Risk:  Minimal: No identifiable suicidal ideation.  Patients presenting with no risk factors but with morbid ruminations; may be classified as minimal risk based on the severity of the depressive symptoms   Follow-up [place], [place]. Go on 07/11/2022.   Why: You have an appointment for therapy services on 07/11/22 at 4:00 pm.  This appointment will be held in person. Contact information: [address], [place], [place] [address] [phone]         [place]. Go on 07/21/2022.   Why: You have an appointment for medication  management services on 07/21/22 at 11:00 am.   This appointment will be held in person. Contact information: [place]  [place] [phone]                Plan Of Care/Follow-up recommendations:  See the discharge recommendations above.  Lindell Spar, NP, pmhnp, fnp-bc. 06/29/2022, 10:33 AM

## 2022-06-29 NOTE — Group Note (Signed)
Date:  06/29/2022 Time:  10:49 AM  Group Topic/Focus:  Goals Group:   The focus of this group is to help patients establish daily goals to achieve during treatment and discuss how the patient can incorporate goal setting into their daily lives to aide in recovery.    Participation Level:  Did Not Attend  Participation Quality:    Affect:    Cognitive:   Insight:   Engagement in Group:    Modes of Intervention:    Additional Comments:    Garvin Fila 06/29/2022, 10:49 AM

## 2022-06-29 NOTE — Progress Notes (Signed)
[facility] LCSW Progress Note   06/29/2022    11:19 AM  Benjamin [name]      Type of Note: Medicare Notice   Patient informed of right to appeal discharge, provided phone number to [facility]. Patient expressed no interest in appealing discharge at this time. CSW will continue to monitor situation.     Signed:   Silas Flood, MSW, [facility] 06/29/2022 11:19 AM

## 2022-06-29 NOTE — Progress Notes (Signed)
Patient's significant other, Christen Bame came to [facility] this evening with concerns about her "husband's discharge". Ms Weston Settle is requesting a call from the provider prior to discharge. [phone]

## 2022-06-29 NOTE — Group Note (Signed)
Date:  06/29/2022 Time:  10:14 AM  Group Topic/Focus:  Goals Group:   The focus of this group is to help patients establish daily goals to achieve during treatment and discuss how the patient can incorporate goal setting into their daily lives to aide in recovery.    Participation Level:  Did Not Attend  Participation Quality:    Affect:    Cognitive:    Insight:   Engagement in Group:    Modes of Intervention:    Additional Comments:    Garvin Fila 06/29/2022, 10:14 AM

## 2022-06-30 NOTE — Progress Notes (Signed)
Patient was evaluated on 4/4//24 around 8 AM, he presents alert and fully oriented, calm and cooperative noting that his admission was treated by stopping his medication and having "a lot of stress in the household" causing poor sleep and caused worsening depression and suicidal ideation led to his suicide gesture by cutting himself which led to this admission.  Patient reports since admission he feels medication has been helpful he still feeling depressed and occasionally feeling hopeless and helpless but mainly related to stress secondary to chronic pain as well as stressful relationship with wife who he describes to have very poor communication "anxiety is a stressful memory she denies any alcohol.  I wish I can stay away but I have no other place to go" he does report some passive SI on and off mainly related to stressors noted but denies any active SI intention or plan, he does confirm that he feels going home today is a safe plan and he describes feeling excited about the.  He is clearly refusing to go to rehab program as he believes that his physical limitations secondary to chronic pain will cause his inability to participate in activities at that program, he is motivated about being discharged home and starting individual counseling to help with long-term management stressors as well as presenting future oriented and goal oriented to make an appointment with his outpatient pain management provider to get further care for his back pain.  He is able to discuss crisis plan if increased depression or recurring active SI after discharge including going to urgent care building or calling [phone] or [phone] or go to the nearest emergency room.  He believes that his medications are at a good level now and does not believe the changes are needed, he agrees to comply with medications and follow-up appointments after discharge including medication management and counseling.  Patient was reported by staff last night reporting  feeling excited about discharge plan, he is attending groups and participating as well as interacting in the milieu.  Patient denies HI or AVH and presents in my opinion with minimal acute suicide risk and ready for discharge.

## 2023-07-25 ENCOUNTER — Ambulatory Visit (HOSPITAL_COMMUNITY): Admit: 2023-07-25 | Payer: Medicare Other | Admitting: Orthopedic Surgery

## 2023-07-25 SURGERY — ARTHROPLASTY, HIP, TOTAL, ANTERIOR APPROACH
Anesthesia: Choice | Site: Hip | Laterality: Left

## 2023-07-25 NOTE — H&P (Signed)
 TOTAL HIP ADMISSION H&P  Patient is admitted for left total hip arthroplasty.  Subjective:  Chief Complaint: Left hip pain  HPI: Benjamin [name], 58 y.o. male, has a history of pain and functional disability in the left hip due to arthritis and patient has failed non-surgical conservative treatments for greater than 12 weeks to include activity modification. Onset of symptoms was abrupt, starting 1 years ago with gradually worsening course since that time. The patient noted no past surgery on the left hip. Patient currently rates pain in the left hip at 8 out of 10 with activity. Patient has worsening of pain with activity and weight bearing and pain that interfers with activities of daily living. Patient has evidence of periarticular osteophytes and joint space narrowing by imaging studies. This condition presents safety issues increasing the risk of falls. There is no current active infection.  Patient Active Problem List   Diagnosis Date Noted   Chronic pain syndrome 06/21/2022   GAD (generalized anxiety disorder) 06/21/2022   Microcytic anemia 05/03/2018   Family hx of colon cancer 05/03/2018   Former smoker 03/04/2018   Lower extremity edema 03/04/2018   Elevated cholesterol 03/04/2018   History of substance abuse (HCC) 03/04/2018   Polysubstance dependence including opioid drug with daily use (HCC) 11/14/2017   Suicidal ideation 11/13/2017   Anxiety and depression    Acute renal failure (HCC)    Altered mental status    Cocaine use disorder (HCC)    AKI (acute kidney injury) (HCC) 11/07/2017   Leukocytosis 11/07/2017   Chronic kidney disease (CKD), stage III (moderate) (HCC) 09/19/2016   Abnormal LFTs 09/19/2016   Major depressive disorder, recurrent episode, severe (HCC) 08/24/2015   Insomnia    MDD (major depressive disorder), recurrent severe, without psychosis (HCC) 08/14/2015   Cannabis use disorder, mild, abuse 08/14/2015   Cocaine abuse (HCC) 08/10/2015   Weight loss,  non-intentional 08/10/2015   Depression 08/10/2015   Essential hypertension, benign    Vaccine refused by patient 05/24/2015   Routine general medical examination at a health care facility 05/24/2015   Chronic back pain 05/24/2015   Family history of cancer 05/24/2015   Screening for prostate cancer 05/24/2015   Smoker 05/24/2015   Colon cancer screening 05/24/2015   Erectile dysfunction 01/31/2014    Past Medical History:  Diagnosis Date   Chronic back pain    managed by [facility]   CKD (chronic kidney disease), stage III (HCC) 10/2017   Erectile dysfunction    History of substance abuse (HCC)    Hx of suicide attempt    Hypertension     Past Surgical History:  Procedure Laterality Date   ANKLE SURGERY     right   APPLICATION OF WOUND VAC Right 08/17/2015   Procedure: APPLICATION OF WOUND VAC;  Surgeon: Ronn Cohn, MD;  Location: [facility] OR;  Service: Orthopedics;  Laterality: Right;   HAND SURGERY     fracture, ORIG, teenage years, right.   I & D EXTREMITY Right 08/10/2015   Procedure: IRRIGATION AND DEBRIDEMENT SKIN, SUBCUTANEOS TISSUE AND  MUSCLE, CARPAL TUNNEL RELEASE, MEDIAN NERVE LYSIS WITH NERVE WRAP;  Surgeon: Ronn Cohn, MD;  Location: [facility] OR;  Service: Orthopedics;  Laterality: Right;   INCISION AND DRAINAGE OF WOUND Right 08/15/2015   Procedure: IRRIGATION AND DEBRIDEMENT WOUND;  Surgeon: Ellard Gunning, MD;  Location: [facility] ORS;  Service: Orthopedics;  Laterality: Right;   LUMBAR EPIDURAL INJECTION     [facility]   SKIN  SPLIT GRAFT Right 08/17/2015   Procedure: I&D RIGHT FOREARM/POSSIBLE SKIN GRAFT;  Surgeon: Ronn Cohn, MD;  Location: [facility] OR;  Service: Orthopedics;  Laterality: Right;   TENDON REPAIR N/A 07/24/2015   Procedure: WRIST LACERATION AND TENDON REPAIR;  Surgeon: Arvil Birks, MD;  Location: [facility] OR;  Service: Orthopedics;  Laterality: N/A;   WOUND EXPLORATION Right 08/15/2015   Procedure: WOUND EXPLORATION right forearm;   Surgeon: Ellard Gunning, MD;  Location: [facility];  Service: Orthopedics;  Laterality: Right;    Prior to Admission medications   Medication Sig Start Date End Date Taking? Authorizing Provider  amLODipine  (NORVASC ) 10 MG tablet Take 1 tablet (10 mg total) by mouth daily. For high blood pressure 06/30/22   Asuncion Layer I, NP  bacitracin  ointment Apply topically 2 (two) times daily. (May buy from over the counter): Apply lightly once a day: For  wound care 06/29/22   Asuncion Layer I, NP  buPROPion  (WELLBUTRIN  XL) 300 MG 24 hr tablet Take 1 tablet (300 mg total) by mouth daily. For depression 06/29/22   Asuncion Layer I, NP  cephALEXin  (KEFLEX ) 500 MG capsule Take 1 capsule (500 mg total) by mouth every 12 (twelve) hours. For wound infection 06/29/22   Asuncion Layer I, NP  HYDROcodone -acetaminophen  (NORCO) 10-325 MG tablet Take 0.5-1 tablets by mouth every 8 (eight) hours as needed. 12/23/20   [provider]  hydrOXYzine  (ATARAX ) 25 MG tablet Take 1 tablet (25 mg total) by mouth 3 (three) times daily as needed for anxiety. 06/29/22   Asuncion Layer I, NP  irbesartan  (AVAPRO ) 300 MG tablet Take 1 tablet (300 mg total) by mouth daily. For high blood pressure 06/30/22   Nwoko, Devra Fontana I, NP  melatonin 3 MG TABS tablet Take 2 tablets (6 mg total) by mouth at bedtime. For sleep 06/29/22   Asuncion Layer I, NP  mirtazapine  (REMERON ) 7.5 MG tablet Take 1 tablet (7.5 mg total) by mouth at bedtime. For sleep/depression 06/29/22   Asuncion Layer I, NP  rosuvastatin  (CRESTOR ) 10 MG tablet Take 10 mg by mouth daily. 12/02/21   [provider]    No Known Allergies  Social History   Socioeconomic History   Marital status: Single    Spouse name: Not on file   Number of children: 0   Years of education: Not on file   Highest education level: Not on file  Occupational History   Occupation: unemployed  Tobacco Use   Smoking status: Former    Current packs/day: 0.50    Average packs/day: 0.5 packs/day for 6.0 years (3.0  ttl pk-yrs)    Types: Cigarettes   Smokeless tobacco: Never  Vaping Use   Vaping status: Never Used  Substance and Sexual Activity   Alcohol use: Yes    Alcohol/week: 0.0 standard drinks of alcohol    Comment: occasional   Drug use: Yes    Types: Marijuana, Cocaine, Heroin    Comment: last use cocaine 08/08/15, last use MJ 08/08/15   Sexual activity: Yes  Other Topics Concern   Not on file  Social History Narrative   Single,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   No children.  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   09/2017.    Social Drivers of Corporate investment banker Strain: Not on file  Food Insecurity: Low Risk  (05/18/2023)   Received from [facility]   Hunger Vital Sign    Worried About Running Out of Food in the  Last Year: Never true    Ran Out of Food in the Last Year: Never true  Transportation Needs: No Transportation Needs (05/18/2023)   Received from [facility]    In the past 12 months, has lack of reliable transportation kept you from medical appointments, meetings, work or from getting things needed for daily living? : No  Physical Activity: Not on file  Stress: Not on file  Social Connections: Unknown (08/09/2021)   Received from [facility]   Social Network    Social Network: Not on file  Intimate Partner Violence: Unknown (07/01/2021)   Received from [facility]   HITS    Physically Hurt: Not on file    Insult or Talk Down To: Not on file    Threaten Physical Harm: Not on file    Scream or Curse: Not on file    Tobacco Use: Medium Risk (07/23/2023)   Received from [facility]   Patient History    Smoking Tobacco Use: Former    Smokeless Tobacco Use: Never    Passive Exposure: Not on file   Social History   Substance and Sexual Activity  Alcohol Use Yes   Alcohol/week: 0.0 standard drinks of alcohol   Comment: occasional    Family History  Problem  Relation Age of Onset   Cancer Mother        lung, smoker   Anxiety disorder Mother    Cancer Father        throat   Diabetes Paternal Grandmother    Cancer Paternal Aunt    Colon cancer Paternal Aunt    Heart disease Neg Hx    Stroke Neg Hx    Esophageal cancer Neg Hx    Inflammatory bowel disease Neg Hx    Liver disease Neg Hx    Pancreatic cancer Neg Hx    Rectal cancer Neg Hx    Stomach cancer Neg Hx     ROS   Objective:  Physical Exam: - Well-developed male, alert, oriented, no apparent distress.  - Evaluation of his right hip shows normal range of motion without discomfort.  - Left hip shows flexion to 100 degrees, no internal rotation, approximately 10 degrees of external rotation, and 10 to 20 degrees of abduction.  - Gait pattern is significantly antalgic with a cane.   IMAGING:  Radiographs from the last visit demonstrate bone-on-bone arthritis in the left hip with subchondral cystic formation and marginal osteophyte formation. The right hip appears normal.     Assessment/Plan:  End stage arthritis, left hip  The patient history, physical examination, clinical judgement of the provider and imaging studies are consistent with end stage degenerative joint disease of the left hip and total hip arthroplasty is deemed medically necessary. The treatment options including medical management, injection therapy, arthroscopy and arthroplasty were discussed at length. The risks and benefits of total hip arthroplasty were presented and reviewed. The risks due to aseptic loosening, infection, stiffness, dislocation/subluxation, thromboembolic complications and other imponderables were discussed. The patient acknowledged the explanation, agreed to proceed with the plan and consent was signed. Patient is being admitted for inpatient treatment for surgery, pain control, PT, OT, prophylactic antibiotics, VTE prophylaxis, progressive ambulation and ADLs and discharge planning.The  patient is planning to be discharged home.   Patient's anticipated LOS is less than 2 midnights, meeting these requirements: - Younger than 23 - Lives within 1 hour of care - Has a competent adult at home to  recover with post-op recover - NO history of  - Diabetes  - Coronary Artery Disease  - Heart failure  - Heart attack  - Stroke  - DVT/VTE  - Cardiac arrhythmia  - Respiratory Failure/COPD  - Anemia  - Advanced Liver disease   Therapy Plans: HEP Disposition: Home with wife Planned DVT Prophylaxis: Eliquis 2.5mg  BID (hx kidney dysfunction) DME Needed: Otho Blitz ordered PCP: Artemio Larry, MD (clearance received) TXA: IV Allergies: NKDA Anesthesia Concerns: None BMI: 29.3 Last HgbA1c: 6.1% on 05/25/23 Pharmacy: [facility], deliver to room  Other: -Hx kidney dysfunction -Taking percocet 10mg  3-4 times daily.  - Patient was instructed on what medications to stop prior to surgery. - Follow-up visit in 2 weeks with Dr. France Ina - Begin physical therapy following surgery - Pre-operative lab work as pre-surgical testing - Prescriptions will be provided in hospital at time of discharge  Angelo Kennedy, PA-C Orthopedic Surgery [facility] [place]

## 2023-08-03 NOTE — Patient Instructions (Addendum)
 SURGICAL WAITING ROOM VISITATION Patients having surgery or a procedure may have no more than 2 support people in the waiting area - these visitors may rotate.    Children under the age of 21 must have an adult with them who is not the patient.  If the patient needs to stay at the hospital during part of their recovery, the visitor guidelines for inpatient rooms apply. Pre-op nurse will coordinate an appropriate time for 1 support person to accompany patient in pre-op.  This support person may not rotate.    Please refer to the [facility] website for the visitor guidelines for Inpatients (after your surgery is over and you are in a regular room).       Your procedure is scheduled on: 08-15-23   Report to [facility] Main Entrance    Report to admitting at 6:00 AM   Call this number if you have problems the morning of surgery [phone]   Do not eat food :After Midnight.   After Midnight you may have the following liquids until 5:30 AM DAY OF SURGERY  Water Non-Citrus Juices (without pulp, NO RED-Apple, White grape, White cranberry) Black Coffee (NO MILK/CREAM OR CREAMERS, sugar ok)  Clear Tea (NO MILK/CREAM OR CREAMERS, sugar ok) regular and decaf                             Plain Jell-O (NO RED)                                           Fruit ices (not with fruit pulp, NO RED)                                     Popsicles (NO RED)                                                               Sports drinks like Gatorade (NO RED)                   The day of surgery:  Drink ONE (1) Pre-Surgery Clear Ensure  by 5:30 AM the morning of surgery. Drink in one sitting. Do not sip.  This drink was given to you during your hospital  pre-op appointment visit. Nothing else to drink after completing the Pre-Surgery Clear Ensure.          If you have questions, please contact your surgeon's office.   FOLLOW ANY ADDITIONAL PRE OP INSTRUCTIONS YOU RECEIVED FROM YOUR SURGEON'S  OFFICE!!!     Oral Hygiene is also important to reduce your risk of infection.                                    Remember - BRUSH YOUR TEETH THE MORNING OF SURGERY WITH YOUR REGULAR TOOTHPASTE   Do NOT smoke after Midnight   Take these medicines the morning of surgery with A SIP OF WATER:    Bupropion    Rosuvastatin   If needed Percocet  Stop all vitamins and herbal supplements 7 days before surgery                              You may not have any metal on your body including  jewelry, and body piercing             Do not wear lotions, powders, cologne, or deodorant              Men may shave face and neck.   Do not bring valuables to the hospital. [facility] IS NOT RESPONSIBLE   FOR VALUABLES.   Contacts, dentures or bridgework may not be worn into surgery.   Bring small overnight bag day of surgery.   DO NOT BRING YOUR HOME MEDICATIONS TO THE HOSPITAL. PHARMACY WILL DISPENSE MEDICATIONS LISTED ON YOUR MEDICATION LIST TO YOU DURING YOUR ADMISSION IN THE HOSPITAL!   Special Instructions: Bring a copy of your healthcare power of attorney and living will documents the day of surgery if you haven't scanned them before.              Please read over the following fact sheets you were given: IF YOU HAVE QUESTIONS ABOUT YOUR PRE-OP INSTRUCTIONS PLEASE CALL [phone] [name]  If you received a COVID test during your pre-op visit  it is requested that you wear a mask when out in public, stay away from anyone that may not be feeling well and notify your surgeon if you develop symptoms. If you test positive for Covid or have been in contact with anyone that has tested positive in the last 10 days please notify you surgeon.   Pre-operative 5 CHG Bath Instructions   You can play a key role in reducing the risk of infection after surgery. Your skin needs to be as free of germs as possible. You can reduce the number of germs on your skin by washing with CHG (chlorhexidine gluconate)  soap before surgery. CHG is an antiseptic soap that kills germs and continues to kill germs even after washing.   DO NOT use if you have an allergy to chlorhexidine/CHG or antibacterial soaps. If your skin becomes reddened or irritated, stop using the CHG and notify one of our RNs at [phone].   Please shower with the CHG soap starting 4 days before surgery using the following schedule:     Please keep in mind the following:  DO NOT shave, including legs and underarms, starting the day of your first shower.   You may shave your face at any point before/day of surgery.  Place clean sheets on your bed the day you start using CHG soap. Use a clean washcloth (not used since being washed) for each shower. DO NOT sleep with pets once you start using the CHG.   CHG Shower Instructions:  If you choose to wash your hair and private area, wash first with your normal shampoo/soap.  After you use shampoo/soap, rinse your hair and body thoroughly to remove shampoo/soap residue.  Turn the water OFF and apply about 3 tablespoons (45 ml) of CHG soap to a CLEAN washcloth.  Apply CHG soap ONLY FROM YOUR NECK DOWN TO YOUR TOES (washing for 3-5 minutes)  DO NOT use CHG soap on face, private areas, open wounds, or sores.  Pay special attention to the area where your surgery is being performed.  If you are having back surgery, having someone wash your  back for you may be helpful. Wait 2 minutes after CHG soap is applied, then you may rinse off the CHG soap.  Pat dry with a clean towel  Put on clean clothes/pajamas   If you choose to wear lotion, please use ONLY the CHG-compatible lotions on the back of this paper.     Additional instructions for the day of surgery: DO NOT APPLY any lotions, deodorants, cologne, or perfumes.   Put on clean/comfortable clothes.  Brush your teeth.  Ask your nurse before applying any prescription medications to the skin.      CHG Compatible Lotions   Aveeno  Moisturizing lotion  Cetaphil Moisturizing Cream  Cetaphil Moisturizing Lotion  Clairol Herbal Essence Moisturizing Lotion, Dry Skin  Clairol Herbal Essence Moisturizing Lotion, Extra Dry Skin  Clairol Herbal Essence Moisturizing Lotion, Normal Skin  Curel Age Defying Therapeutic Moisturizing Lotion with Alpha Hydroxy  Curel Extreme Care Body Lotion  Curel Soothing Hands Moisturizing Hand Lotion  Curel Therapeutic Moisturizing Cream, Fragrance-Free  Curel Therapeutic Moisturizing Lotion, Fragrance-Free  Curel Therapeutic Moisturizing Lotion, Original Formula  Eucerin Daily Replenishing Lotion  Eucerin Dry Skin Therapy Plus Alpha Hydroxy Crme  Eucerin Dry Skin Therapy Plus Alpha Hydroxy Lotion  Eucerin Original Crme  Eucerin Original Lotion  Eucerin Plus Crme Eucerin Plus Lotion  Eucerin TriLipid Replenishing Lotion  Keri Anti-Bacterial Hand Lotion  Keri Deep Conditioning Original Lotion Dry Skin Formula Softly Scented  Keri Deep Conditioning Original Lotion, Fragrance Free Sensitive Skin Formula  Keri Lotion Fast Absorbing Fragrance Free Sensitive Skin Formula  Keri Lotion Fast Absorbing Softly Scented Dry Skin Formula  Keri Original Lotion  Keri Skin Renewal Lotion Keri Silky Smooth Lotion  Keri Silky Smooth Sensitive Skin Lotion  Nivea Body Creamy Conditioning Oil  Nivea Body Extra Enriched Lotion  Nivea Body Original Lotion  Nivea Body Sheer Moisturizing Lotion Nivea Crme  Nivea Skin Firming Lotion  NutraDerm 30 Skin Lotion  NutraDerm Skin Lotion  NutraDerm Therapeutic Skin Cream  NutraDerm Therapeutic Skin Lotion  ProShield Protective Hand Cream  Provon moisturizing lotion   PATIENT SIGNATURE_________________________________  NURSE SIGNATURE__________________________________  ________________________________________________________________________    Benjamin [name]  An incentive spirometer is a tool that can help keep your lungs clear and active. This  tool measures how well you are filling your lungs with each breath. Taking long deep breaths may help reverse or decrease the chance of developing breathing (pulmonary) problems (especially infection) following: A long period of time when you are unable to move or be active. BEFORE THE PROCEDURE  If the spirometer includes an indicator to show your best effort, your nurse or respiratory therapist will set it to a desired goal. If possible, sit up straight or lean slightly forward. Try not to slouch. Hold the incentive spirometer in an upright position. INSTRUCTIONS FOR USE  Sit on the edge of your bed if possible, or sit up as far as you can in bed or on a chair. Hold the incentive spirometer in an upright position. Breathe out normally. Place the mouthpiece in your mouth and seal your lips tightly around it. Breathe in slowly and as deeply as possible, raising the piston or the ball toward the top of the column. Hold your breath for 3-5 seconds or for as long as possible. Allow the piston or ball to fall to the bottom of the column. Remove the mouthpiece from your mouth and breathe out normally. Rest for a few seconds and repeat Steps 1 through 7 at least 10 times every  1-2 hours when you are awake. Take your time and take a few normal breaths between deep breaths. The spirometer may include an indicator to show your best effort. Use the indicator as a goal to work toward during each repetition. After each set of 10 deep breaths, practice coughing to be sure your lungs are clear. If you have an incision (the cut made at the time of surgery), support your incision when coughing by placing a pillow or rolled up towels firmly against it. Once you are able to get out of bed, walk around indoors and cough well. You may stop using the incentive spirometer when instructed by your caregiver.  RISKS AND COMPLICATIONS Take your time so you do not get dizzy or light-headed. If you are in pain, you may need  to take or ask for pain medication before doing incentive spirometry. It is harder to take a deep breath if you are having pain. AFTER USE Rest and breathe slowly and easily. It can be helpful to keep track of a log of your progress. Your caregiver can provide you with a simple table to help with this. If you are using the spirometer at home, follow these instructions: SEEK MEDICAL CARE IF:  You are having difficultly using the spirometer. You have trouble using the spirometer as often as instructed. Your pain medication is not giving enough relief while using the spirometer. You develop fever of 100.5 F (38.1 C) or higher. SEEK IMMEDIATE MEDICAL CARE IF:  You cough up bloody sputum that had not been present before. You develop fever of 102 F (38.9 C) or greater. You develop worsening pain at or near the incision site. MAKE SURE YOU:  Understand these instructions. Will watch your condition. Will get help right away if you are not doing well or get worse. Document Released: 07/24/2006 Document Revised: 06/05/2011 Document Reviewed: 09/24/2006 Exit[employer] Patient Information 2014 [employer], [place].   ________________________________________________________________________ WHAT IS A BLOOD TRANSFUSION? Blood Transfusion Information  A transfusion is the replacement of blood or some of its parts. Blood is made up of multiple cells which provide different functions. Red blood cells carry oxygen and are used for blood loss replacement. White blood cells fight against infection. Platelets control bleeding. Plasma helps clot blood. Other blood products are available for specialized needs, such as hemophilia or other clotting disorders. BEFORE THE TRANSFUSION  Who gives blood for transfusions?  Healthy volunteers who are fully evaluated to make sure their blood is safe. This is blood bank blood. Transfusion therapy is the safest it has ever been in the practice of medicine. Before blood is  taken from a donor, a complete history is taken to make sure that person has no history of diseases nor engages in risky social behavior (examples are intravenous drug use or sexual activity with multiple partners). The donor's travel history is screened to minimize risk of transmitting infections, such as malaria. The donated blood is tested for signs of infectious diseases, such as HIV and hepatitis. The blood is then tested to be sure it is compatible with you in order to minimize the chance of a transfusion reaction. If you or a relative donates blood, this is often done in anticipation of surgery and is not appropriate for emergency situations. It takes many days to process the donated blood. RISKS AND COMPLICATIONS Although transfusion therapy is very safe and saves many lives, the main dangers of transfusion include:  Getting an infectious disease. Developing a transfusion reaction. This is an allergic  reaction to something in the blood you were given. Every precaution is taken to prevent this. The decision to have a blood transfusion has been considered carefully by your caregiver before blood is given. Blood is not given unless the benefits outweigh the risks. AFTER THE TRANSFUSION Right after receiving a blood transfusion, you will usually feel much better and more energetic. This is especially true if your red blood cells have gotten low (anemic). The transfusion raises the level of the red blood cells which carry oxygen, and this usually causes an energy increase. The nurse administering the transfusion will monitor you carefully for complications. HOME CARE INSTRUCTIONS  No special instructions are needed after a transfusion. You may find your energy is better. Speak with your caregiver about any limitations on activity for underlying diseases you may have. SEEK MEDICAL CARE IF:  Your condition is not improving after your transfusion. You develop redness or irritation at the intravenous  (IV) site. SEEK IMMEDIATE MEDICAL CARE IF:  Any of the following symptoms occur over the next 12 hours: Shaking chills. You have a temperature by mouth above 102 F (38.9 C), not controlled by medicine. Chest, back, or muscle pain. People around you feel you are not acting correctly or are confused. Shortness of breath or difficulty breathing. Dizziness and fainting. You get a rash or develop hives. You have a decrease in urine output. Your urine turns a dark color or changes to pink, red, or brown. Any of the following symptoms occur over the next 10 days: You have a temperature by mouth above 102 F (38.9 C), not controlled by medicine. Shortness of breath. Weakness after normal activity. The white part of the eye turns yellow (jaundice). You have a decrease in the amount of urine or are urinating less often. Your urine turns a dark color or changes to pink, red, or brown. Document Released: 03/10/2000 Document Revised: 06/05/2011 Document Reviewed: 10/28/2007 [facility] Patient Information 2014 [place], [place].  _______________________________________________________________________

## 2023-08-06 ENCOUNTER — Encounter (HOSPITAL_COMMUNITY)
Admission: RE | Admit: 2023-08-06 | Discharge: 2023-08-06 | Disposition: A | Discharge status: Home / Self Care | Source: Ambulatory Visit | Attending: Orthopedic Surgery | Admitting: Orthopedic Surgery

## 2023-08-06 ENCOUNTER — Other Ambulatory Visit: Payer: Self-pay

## 2023-08-06 ENCOUNTER — Encounter (HOSPITAL_COMMUNITY): Payer: Self-pay

## 2023-08-06 VITALS — BP 138/101 | HR 69 | Temp 98.1°F | Resp 16 | Ht 74.0 in | Wt 215.0 lb

## 2023-08-06 DIAGNOSIS — Z01818 Encounter for other preprocedural examination: Secondary | ICD-10-CM

## 2023-08-06 DIAGNOSIS — Z01812 Encounter for preprocedural laboratory examination: Secondary | ICD-10-CM | POA: Insufficient documentation

## 2023-08-06 HISTORY — DX: Depression, unspecified: F32.A

## 2023-08-06 HISTORY — DX: Unspecified osteoarthritis, unspecified site: M19.90

## 2023-08-06 LAB — SURGICAL PCR SCREEN
MRSA, PCR: NEGATIVE
Staphylococcus aureus: NEGATIVE

## 2023-08-06 NOTE — Progress Notes (Addendum)
 COVID Vaccine received:  []  No [x]  Yes Date of any COVID positive Test in last 90 days: no PCP - Dr. Germaine Kohut on [address] Cardiologist -   Chest x-ray -  EKG -  05/18/23 [facility] Stress Test -  ECHO - 11/08/17 Epic Cardiac Cath -   Bowel Prep - [x]  No  []   Yes ______  Pacemaker / ICD device [x]  No []  Yes   Spinal Cord Stimulator:[x]  No []  Yes       History of Sleep Apnea? [x]  No []  Yes   CPAP used?- [x]  No []  Yes    Does the patient monitor blood sugar?          [x]  No []  Yes  []  N/A  Patient has: [x]  NO Hx DM   []  Pre-DM                 []  DM1  []   DM2 Does patient have a [employer] or Dexacom? []  No []  Yes   Fasting Blood Sugar Ranges-  Checks Blood Sugar _____ times a day  GLP1 agonist / usual dose - no GLP1 instructions:  SGLT-2 inhibitors / usual dose - no SGLT-2 instructions:   Blood Thinner / Instructions:no Aspirin Instructions:no  Comments:   Activity level: Patient is able  to climb a flight of stairs without difficulty; [x]  No CP  [x]  No SOB,   Patient can perform ADLs without assistance.   Anesthesia review:   Patient denies shortness of breath, fever, cough and chest pain at PAT appointment.  Patient verbalized understanding and agreement to the Pre-Surgical Instructions that were given to them at this PAT appointment. Patient was also educated of the need to review these PAT instructions again prior to his/her surgery.I reviewed the appropriate phone numbers to call if they have any and questions or concerns.

## 2023-08-15 ENCOUNTER — Ambulatory Visit (HOSPITAL_COMMUNITY): Admitting: Anesthesiology

## 2023-08-15 ENCOUNTER — Observation Stay (HOSPITAL_COMMUNITY)

## 2023-08-15 ENCOUNTER — Encounter (HOSPITAL_COMMUNITY)
Admission: RE | Disposition: A | Discharge status: Home / Self Care | Payer: Self-pay | Source: Ambulatory Visit | Attending: Orthopedic Surgery

## 2023-08-15 ENCOUNTER — Other Ambulatory Visit: Payer: Self-pay

## 2023-08-15 ENCOUNTER — Observation Stay (HOSPITAL_COMMUNITY)
Admission: RE | Admit: 2023-08-15 | Discharge: 2023-08-16 | Disposition: A | Discharge status: Home / Self Care | Source: Ambulatory Visit | Attending: Orthopedic Surgery | Admitting: Orthopedic Surgery

## 2023-08-15 ENCOUNTER — Encounter (HOSPITAL_COMMUNITY): Payer: Self-pay | Admitting: Orthopedic Surgery

## 2023-08-15 ENCOUNTER — Ambulatory Visit (HOSPITAL_BASED_OUTPATIENT_CLINIC_OR_DEPARTMENT_OTHER): Admitting: Anesthesiology

## 2023-08-15 ENCOUNTER — Ambulatory Visit (HOSPITAL_COMMUNITY)

## 2023-08-15 DIAGNOSIS — M1612 Unilateral primary osteoarthritis, left hip: Principal | ICD-10-CM | POA: Diagnosis present

## 2023-08-15 DIAGNOSIS — M169 Osteoarthritis of hip, unspecified: Secondary | ICD-10-CM | POA: Diagnosis present

## 2023-08-15 DIAGNOSIS — I129 Hypertensive chronic kidney disease with stage 1 through stage 4 chronic kidney disease, or unspecified chronic kidney disease: Secondary | ICD-10-CM

## 2023-08-15 DIAGNOSIS — N183 Chronic kidney disease, stage 3 unspecified: Secondary | ICD-10-CM | POA: Diagnosis not present

## 2023-08-15 DIAGNOSIS — F418 Other specified anxiety disorders: Secondary | ICD-10-CM | POA: Diagnosis not present

## 2023-08-15 DIAGNOSIS — Z79899 Other long term (current) drug therapy: Secondary | ICD-10-CM | POA: Insufficient documentation

## 2023-08-15 DIAGNOSIS — Z87891 Personal history of nicotine dependence: Secondary | ICD-10-CM | POA: Diagnosis not present

## 2023-08-15 HISTORY — PX: TOTAL HIP ARTHROPLASTY: SHX124

## 2023-08-15 LAB — TYPE AND SCREEN
ABO/RH(D): A POS
Antibody Screen: NEGATIVE

## 2023-08-15 SURGERY — ARTHROPLASTY, HIP, TOTAL, ANTERIOR APPROACH
Anesthesia: Monitor Anesthesia Care | Site: Hip | Laterality: Left

## 2023-08-15 MED ORDER — CEFAZOLIN SODIUM-DEXTROSE 2-4 GM/100ML-% IV SOLN
2.0000 g | INTRAVENOUS | Status: AC
  Administered 2023-08-15: 2 g via INTRAVENOUS
  Filled 2023-08-15: qty 100

## 2023-08-15 MED ORDER — SODIUM CHLORIDE 0.9 % IV SOLN: INTRAVENOUS | Status: DC

## 2023-08-15 MED ORDER — ACETAMINOPHEN 10 MG/ML IV SOLN
1000.0000 mg | Freq: Four times a day (QID) | INTRAVENOUS | Status: DC
  Administered 2023-08-15: 1000 mg via INTRAVENOUS
  Filled 2023-08-15: qty 100

## 2023-08-15 MED ORDER — METHOCARBAMOL 500 MG PO TABS
500.0000 mg | ORAL_TABLET | Freq: Four times a day (QID) | ORAL | Status: DC | PRN
  Administered 2023-08-15 (×2): 500 mg via ORAL
  Filled 2023-08-15: qty 1

## 2023-08-15 MED ORDER — FERROUS SULFATE 325 (65 FE) MG PO TABS
325.0000 mg | ORAL_TABLET | Freq: Every day | ORAL | Status: DC
  Administered 2023-08-16: 325 mg via ORAL
  Filled 2023-08-15: qty 1

## 2023-08-15 MED ORDER — ONDANSETRON HCL 4 MG/2ML IJ SOLN
INTRAMUSCULAR | Status: DC | PRN
  Administered 2023-08-15: 4 mg via INTRAVENOUS

## 2023-08-15 MED ORDER — FENTANYL CITRATE PF 50 MCG/ML IJ SOSY
PREFILLED_SYRINGE | INTRAMUSCULAR | Status: AC
  Filled 2023-08-15: qty 3

## 2023-08-15 MED ORDER — MAGNESIUM CITRATE PO SOLN: 1.0000 | Freq: Once | ORAL | Status: DC | PRN

## 2023-08-15 MED ORDER — ACETAMINOPHEN 10 MG/ML IV SOLN: 1000.0000 mg | Freq: Once | INTRAVENOUS | Status: DC | PRN

## 2023-08-15 MED ORDER — FENTANYL CITRATE (PF) 100 MCG/2ML IJ SOLN
INTRAMUSCULAR | Status: AC
  Filled 2023-08-15: qty 2

## 2023-08-15 MED ORDER — ORAL CARE MOUTH RINSE: 15.0000 mL | Freq: Once | OROMUCOSAL | Status: AC

## 2023-08-15 MED ORDER — METOCLOPRAMIDE HCL 5 MG/ML IJ SOLN: 5.0000 mg | Freq: Three times a day (TID) | INTRAMUSCULAR | Status: DC | PRN

## 2023-08-15 MED ORDER — OXYCODONE HCL 5 MG PO TABS
ORAL_TABLET | ORAL | Status: AC
Start: 2023-08-15 — End: ?
  Filled 2023-08-15: qty 2

## 2023-08-15 MED ORDER — AMLODIPINE BESYLATE 5 MG PO TABS
5.0000 mg | ORAL_TABLET | Freq: Every day | ORAL | Status: DC
  Administered 2023-08-16: 5 mg via ORAL
  Filled 2023-08-15: qty 1

## 2023-08-15 MED ORDER — ONDANSETRON HCL 4 MG/2ML IJ SOLN
INTRAMUSCULAR | Status: AC
  Filled 2023-08-15: qty 2

## 2023-08-15 MED ORDER — PROPOFOL 10 MG/ML IV BOLUS
INTRAVENOUS | Status: DC | PRN
  Administered 2023-08-15: 40 mg via INTRAVENOUS

## 2023-08-15 MED ORDER — MIDAZOLAM HCL 2 MG/2ML IJ SOLN
INTRAMUSCULAR | Status: AC
  Filled 2023-08-15: qty 2

## 2023-08-15 MED ORDER — TRANEXAMIC ACID-NACL 1000-0.7 MG/100ML-% IV SOLN
1000.0000 mg | INTRAVENOUS | Status: AC
  Administered 2023-08-15: 1000 mg via INTRAVENOUS
  Filled 2023-08-15: qty 100

## 2023-08-15 MED ORDER — DEXAMETHASONE SODIUM PHOSPHATE 10 MG/ML IJ SOLN
10.0000 mg | Freq: Once | INTRAMUSCULAR | Status: AC
  Administered 2023-08-16: 10 mg via INTRAVENOUS
  Filled 2023-08-15: qty 1

## 2023-08-15 MED ORDER — HYDROMORPHONE HCL 1 MG/ML IJ SOLN
0.5000 mg | INTRAMUSCULAR | Status: DC | PRN
  Administered 2023-08-15 – 2023-08-16 (×4): 1 mg via INTRAVENOUS
  Filled 2023-08-15 (×4): qty 1

## 2023-08-15 MED ORDER — PROPOFOL 500 MG/50ML IV EMUL
INTRAVENOUS | Status: AC
Start: 2023-08-15 — End: ?
  Filled 2023-08-15: qty 50

## 2023-08-15 MED ORDER — ROSUVASTATIN CALCIUM 20 MG PO TABS
20.0000 mg | ORAL_TABLET | Freq: Every day | ORAL | Status: DC
  Administered 2023-08-16: 20 mg via ORAL
  Filled 2023-08-15: qty 1

## 2023-08-15 MED ORDER — OXYCODONE-ACETAMINOPHEN 10-325 MG PO TABS: 1.0000 | ORAL_TABLET | Freq: Four times a day (QID) | ORAL | Status: DC | PRN

## 2023-08-15 MED ORDER — POVIDONE-IODINE 10 % EX SWAB: 2.0000 | Freq: Once | CUTANEOUS | Status: DC

## 2023-08-15 MED ORDER — PHENOL 1.4 % MT LIQD: 1.0000 | OROMUCOSAL | Status: DC | PRN

## 2023-08-15 MED ORDER — METHOCARBAMOL 500 MG PO TABS
ORAL_TABLET | ORAL | Status: AC
  Filled 2023-08-15: qty 1

## 2023-08-15 MED ORDER — HYDROMORPHONE HCL 2 MG PO TABS
4.0000 mg | ORAL_TABLET | ORAL | Status: DC | PRN
  Administered 2023-08-15: 4 mg via ORAL
  Administered 2023-08-16 (×4): 8 mg via ORAL
  Filled 2023-08-15: qty 4
  Filled 2023-08-15: qty 2
  Filled 2023-08-15 (×3): qty 4

## 2023-08-15 MED ORDER — PROPOFOL 1000 MG/100ML IV EMUL
INTRAVENOUS | Status: AC
  Filled 2023-08-15: qty 100

## 2023-08-15 MED ORDER — ONDANSETRON HCL 4 MG/2ML IJ SOLN: 4.0000 mg | Freq: Four times a day (QID) | INTRAMUSCULAR | Status: DC | PRN

## 2023-08-15 MED ORDER — MENTHOL 3 MG MT LOZG: 1.0000 | LOZENGE | OROMUCOSAL | Status: DC | PRN

## 2023-08-15 MED ORDER — METHOCARBAMOL 1000 MG/10ML IJ SOLN: 500.0000 mg | Freq: Four times a day (QID) | INTRAMUSCULAR | Status: DC | PRN

## 2023-08-15 MED ORDER — OXYCODONE HCL 5 MG PO TABS
ORAL_TABLET | ORAL | Status: AC
  Filled 2023-08-15: qty 1

## 2023-08-15 MED ORDER — DEXAMETHASONE SODIUM PHOSPHATE 10 MG/ML IJ SOLN
8.0000 mg | Freq: Once | INTRAMUSCULAR | Status: AC
  Administered 2023-08-15: 8 mg via INTRAVENOUS

## 2023-08-15 MED ORDER — DOCUSATE SODIUM 100 MG PO CAPS
100.0000 mg | ORAL_CAPSULE | Freq: Two times a day (BID) | ORAL | Status: DC
  Administered 2023-08-15 – 2023-08-16 (×2): 100 mg via ORAL
  Filled 2023-08-15 (×2): qty 1

## 2023-08-15 MED ORDER — BUPROPION HCL ER (XL) 300 MG PO TB24: 300.0000 mg | ORAL_TABLET | Freq: Every day | ORAL | Status: DC

## 2023-08-15 MED ORDER — BUPROPION HCL ER (XL) 300 MG PO TB24
450.0000 mg | ORAL_TABLET | Freq: Every day | ORAL | Status: DC
  Administered 2023-08-15 – 2023-08-16 (×2): 450 mg via ORAL
  Filled 2023-08-15 (×2): qty 1

## 2023-08-15 MED ORDER — POLYETHYLENE GLYCOL 3350 17 G PO PACK: 17.0000 g | PACK | Freq: Every day | ORAL | Status: DC | PRN

## 2023-08-15 MED ORDER — CEFAZOLIN SODIUM-DEXTROSE 2-4 GM/100ML-% IV SOLN
2.0000 g | Freq: Four times a day (QID) | INTRAVENOUS | Status: AC
  Administered 2023-08-15 (×2): 2 g via INTRAVENOUS
  Filled 2023-08-15 (×2): qty 100

## 2023-08-15 MED ORDER — APIXABAN 2.5 MG PO TABS
2.5000 mg | ORAL_TABLET | Freq: Two times a day (BID) | ORAL | Status: DC
  Administered 2023-08-16: 2.5 mg via ORAL
  Filled 2023-08-15: qty 1

## 2023-08-15 MED ORDER — IRBESARTAN 150 MG PO TABS
150.0000 mg | ORAL_TABLET | Freq: Every day | ORAL | Status: DC
  Administered 2023-08-16: 150 mg via ORAL
  Filled 2023-08-15: qty 1

## 2023-08-15 MED ORDER — BISACODYL 10 MG RE SUPP: 10.0000 mg | Freq: Every day | RECTAL | Status: DC | PRN

## 2023-08-15 MED ORDER — METOCLOPRAMIDE HCL 5 MG PO TABS: 5.0000 mg | ORAL_TABLET | Freq: Three times a day (TID) | ORAL | Status: DC | PRN

## 2023-08-15 MED ORDER — FENTANYL CITRATE PF 50 MCG/ML IJ SOSY
25.0000 ug | PREFILLED_SYRINGE | INTRAMUSCULAR | Status: DC | PRN
  Administered 2023-08-15 (×3): 50 ug via INTRAVENOUS

## 2023-08-15 MED ORDER — PHENYLEPHRINE HCL-NACL 20-0.9 MG/250ML-% IV SOLN
INTRAVENOUS | Status: DC | PRN
  Administered 2023-08-15: 25 ug/min via INTRAVENOUS

## 2023-08-15 MED ORDER — BUPIVACAINE IN DEXTROSE 0.75-8.25 % IT SOLN
INTRATHECAL | Status: DC | PRN
  Administered 2023-08-15: 2 mL via INTRATHECAL

## 2023-08-15 MED ORDER — LACTATED RINGERS IV SOLN: INTRAVENOUS | Status: DC

## 2023-08-15 MED ORDER — OXYCODONE HCL 5 MG PO TABS
5.0000 mg | ORAL_TABLET | Freq: Once | ORAL | Status: AC | PRN
  Administered 2023-08-15: 5 mg via ORAL

## 2023-08-15 MED ORDER — 0.9 % SODIUM CHLORIDE (POUR BTL) OPTIME
TOPICAL | Status: DC | PRN
  Administered 2023-08-15: 1000 mL

## 2023-08-15 MED ORDER — LIDOCAINE HCL (PF) 2 % IJ SOLN
INTRAMUSCULAR | Status: AC
Start: 2023-08-15 — End: ?
  Filled 2023-08-15: qty 5

## 2023-08-15 MED ORDER — BUPROPION HCL ER (XL) 150 MG PO TB24: 150.0000 mg | ORAL_TABLET | Freq: Every morning | ORAL | Status: DC

## 2023-08-15 MED ORDER — CHLORHEXIDINE GLUCONATE 0.12 % MT SOLN
15.0000 mL | Freq: Once | OROMUCOSAL | Status: AC
  Administered 2023-08-15: 15 mL via OROMUCOSAL

## 2023-08-15 MED ORDER — BUPIVACAINE-EPINEPHRINE (PF) 0.25% -1:200000 IJ SOLN
INTRAMUSCULAR | Status: AC
  Filled 2023-08-15: qty 30

## 2023-08-15 MED ORDER — ONDANSETRON HCL 4 MG PO TABS: 4.0000 mg | ORAL_TABLET | Freq: Four times a day (QID) | ORAL | Status: DC | PRN

## 2023-08-15 MED ORDER — DEXAMETHASONE SODIUM PHOSPHATE 10 MG/ML IJ SOLN
INTRAMUSCULAR | Status: AC
  Filled 2023-08-15: qty 1

## 2023-08-15 MED ORDER — BUPIVACAINE-EPINEPHRINE (PF) 0.25% -1:200000 IJ SOLN
INTRAMUSCULAR | Status: DC | PRN
  Administered 2023-08-15: 30 mL

## 2023-08-15 MED ORDER — AMLODIPINE BESYLATE-VALSARTAN 5-160 MG PO TABS: 1.0000 | ORAL_TABLET | Freq: Every day | ORAL | Status: DC

## 2023-08-15 MED ORDER — OXYCODONE HCL 5 MG PO TABS: 5.0000 mg | ORAL_TABLET | Freq: Four times a day (QID) | ORAL | Status: DC | PRN

## 2023-08-15 MED ORDER — METHOCARBAMOL 500 MG PO TABS
750.0000 mg | ORAL_TABLET | Freq: Four times a day (QID) | ORAL | Status: DC | PRN
  Administered 2023-08-16 (×2): 750 mg via ORAL
  Filled 2023-08-15 (×2): qty 2

## 2023-08-15 MED ORDER — HYDROMORPHONE HCL 1 MG/ML IJ SOLN
1.0000 mg | Freq: Once | INTRAMUSCULAR | Status: AC
  Administered 2023-08-15: 1 mg via INTRAVENOUS
  Filled 2023-08-15: qty 1

## 2023-08-15 MED ORDER — ACETAMINOPHEN 325 MG PO TABS: 325.0000 mg | ORAL_TABLET | Freq: Four times a day (QID) | ORAL | Status: DC | PRN

## 2023-08-15 MED ORDER — PROPOFOL 500 MG/50ML IV EMUL
INTRAVENOUS | Status: DC | PRN
  Administered 2023-08-15: 80 ug/kg/min via INTRAVENOUS

## 2023-08-15 MED ORDER — OXYCODONE HCL 5 MG/5ML PO SOLN: 5.0000 mg | Freq: Once | ORAL | Status: AC | PRN

## 2023-08-15 MED ORDER — MIDAZOLAM HCL 5 MG/5ML IJ SOLN
INTRAMUSCULAR | Status: DC | PRN
  Administered 2023-08-15: 2 mg via INTRAVENOUS

## 2023-08-15 MED ORDER — FENTANYL CITRATE (PF) 100 MCG/2ML IJ SOLN
INTRAMUSCULAR | Status: DC | PRN
  Administered 2023-08-15 (×2): 50 ug via INTRAVENOUS

## 2023-08-15 MED ORDER — WATER FOR IRRIGATION, STERILE IR SOLN
Status: DC | PRN
  Administered 2023-08-15: 1000 mL

## 2023-08-15 MED ORDER — OXYCODONE HCL 5 MG PO TABS
5.0000 mg | ORAL_TABLET | Freq: Four times a day (QID) | ORAL | Status: DC | PRN
Start: 2023-08-15 — End: 2023-08-15
  Administered 2023-08-15 (×2): 10 mg via ORAL
  Filled 2023-08-15: qty 2

## 2023-08-15 MED ORDER — OXYCODONE-ACETAMINOPHEN 5-325 MG PO TABS: 1.0000 | ORAL_TABLET | Freq: Four times a day (QID) | ORAL | Status: DC | PRN

## 2023-08-15 MED ORDER — PROPOFOL 10 MG/ML IV BOLUS
INTRAVENOUS | Status: AC
  Filled 2023-08-15: qty 20

## 2023-08-15 SURGICAL SUPPLY — 35 items
BAG COUNTER SPONGE SURGICOUNT (BAG) IMPLANT
BAG ZIPLOCK 12X15 (MISCELLANEOUS) IMPLANT
BLADE SAG 18X100X1.27 (BLADE) ×1 IMPLANT
COVER PERINEAL POST (MISCELLANEOUS) ×1 IMPLANT
COVER SURGICAL LIGHT HANDLE (MISCELLANEOUS) ×1 IMPLANT
CUP ACETBLR 54 OD PINNACLE (Hips) IMPLANT
DERMABOND ADVANCED .7 DNX12 (GAUZE/BANDAGES/DRESSINGS) ×1 IMPLANT
DRAPE FOOT SWITCH (DRAPES) ×1 IMPLANT
DRAPE STERI IOBAN 125X83 (DRAPES) ×1 IMPLANT
DRAPE U-SHAPE 47X51 STRL (DRAPES) ×2 IMPLANT
DRSG AQUACEL AG ADV 3.5X10 (GAUZE/BANDAGES/DRESSINGS) ×1 IMPLANT
DURAPREP 26ML APPLICATOR (WOUND CARE) ×1 IMPLANT
ELECT REM PT RETURN 15FT ADLT (MISCELLANEOUS) ×1 IMPLANT
GLOVE BIO SURGEON STRL SZ 6.5 (GLOVE) IMPLANT
GLOVE BIO SURGEON STRL SZ7 (GLOVE) IMPLANT
GLOVE BIO SURGEON STRL SZ8 (GLOVE) ×1 IMPLANT
GLOVE BIOGEL PI IND STRL 7.0 (GLOVE) IMPLANT
GLOVE BIOGEL PI IND STRL 8 (GLOVE) ×1 IMPLANT
GOWN STRL REUS W/ TWL LRG LVL3 (GOWN DISPOSABLE) ×2 IMPLANT
HEAD CERAMIC 36 PLUS5 (Hips) IMPLANT
HOLDER FOLEY CATH W/STRAP (MISCELLANEOUS) ×1 IMPLANT
KIT TURNOVER KIT A (KITS) IMPLANT
LINER MARATHON NEUT +4X54X36 (Hips) IMPLANT
MANIFOLD NEPTUNE II (INSTRUMENTS) ×1 IMPLANT
PACK ANTERIOR HIP CUSTOM (KITS) ×1 IMPLANT
PENCIL SMOKE EVACUATOR COATED (MISCELLANEOUS) ×1 IMPLANT
SPIKE FLUID TRANSFER (MISCELLANEOUS) ×1 IMPLANT
STEM FEMORAL SZ 6MM STD ACTIS (Stem) IMPLANT
SUT ETHIBOND NAB CT1 #1 30IN (SUTURE) ×1 IMPLANT
SUT MNCRL AB 4-0 PS2 18 (SUTURE) ×1 IMPLANT
SUT VIC AB 2-0 CT1 TAPERPNT 27 (SUTURE) ×2 IMPLANT
SUTURE STRATFX 0 PDS 27 VIOLET (SUTURE) ×1 IMPLANT
TOWEL GREEN STERILE FF (TOWEL DISPOSABLE) ×1 IMPLANT
TRAY FOLEY MTR SLVR 16FR STAT (SET/KITS/TRAYS/PACK) ×1 IMPLANT
TUBE SUCTION HIGH CAP CLEAR NV (SUCTIONS) ×1 IMPLANT

## 2023-08-15 NOTE — Anesthesia Procedure Notes (Signed)
 Spinal  Patient location during procedure: OR Start time: 08/15/2023 9:33 AM End time: 08/15/2023 9:38 AM Reason for block: surgical anesthesia Staffing Performed: anesthesiologist  Anesthesiologist: Arvie Latus, MD Performed by: Arvie Latus, MD Authorized by: Arvie Latus, MD   Preanesthetic Checklist Completed: patient identified, IV checked, risks and benefits discussed, surgical consent, monitors and equipment checked, pre-op evaluation and timeout performed Spinal Block Patient position: sitting Prep: DuraPrep Patient monitoring: heart rate, cardiac monitor, continuous pulse ox and blood pressure Approach: midline Location: L4-5 Injection technique: single-shot Needle Needle type: Pencan  Needle gauge: 24 G Needle length: 9 cm Assessment Sensory level: T6 Events: CSF return

## 2023-08-15 NOTE — Op Note (Signed)
 OPERATIVE REPORT- TOTAL HIP ARTHROPLASTY   PREOPERATIVE DIAGNOSIS: Osteoarthritis of the Left hip.   POSTOPERATIVE DIAGNOSIS: Osteoarthritis of the Left  hip.   PROCEDURE: Left total hip arthroplasty, anterior approach.   SURGEON: Liliane Rei, MD   ASSISTANT: Sharlynn Dear, PA-C  ANESTHESIA:  Spinal  ESTIMATED BLOOD LOSS:-200 mL    DRAINS: None  COMPLICATIONS: None   CONDITION: PACU - hemodynamically stable.   BRIEF CLINICAL NOTE: Benjamin [name] is a 58 y.o. male who has advanced end-  stage arthritis of their Left  hip with progressively worsening pain and  dysfunction.The patient has failed nonoperative management and presents for  total hip arthroplasty.   PROCEDURE IN DETAIL: After successful administration of spinal  anesthetic, the traction boots for the [facility] bed were placed on both  feet and the patient was placed onto the [facility] bed, boots placed into the leg  holders. The Left hip was then isolated from the perineum with plastic  drapes and prepped and draped in the usual sterile fashion. ASIS and  greater trochanter were marked and a oblique incision was made, starting  at about 1 cm lateral and 2 cm distal to the ASIS and coursing towards  the anterior cortex of the femur. The skin was cut with a 10 blade  through subcutaneous tissue to the level of the fascia overlying the  tensor fascia lata muscle. The fascia was then incised in line with the  incision at the junction of the anterior third and posterior 2/3rd. The  muscle was teased off the fascia and then the interval between the TFL  and the rectus was developed. The Hohmann retractor was then placed at  the top of the femoral neck over the capsule. The vessels overlying the  capsule were cauterized and the fat on top of the capsule was removed.  A Hohmann retractor was then placed anterior underneath the rectus  femoris to give exposure to the entire anterior capsule. A T-shaped  capsulotomy  was performed. The edges were tagged and the femoral head  was identified.       Osteophytes are removed off the superior acetabulum.  The femoral neck was then cut in situ with an oscillating saw. Traction  was then applied to the left lower extremity utilizing the [facility]  traction. The femoral head was then removed. Retractors were placed  around the acetabulum and then circumferential removal of the labrum was  performed. Osteophytes were also removed. Reaming starts at 51 mm to  medialize and  Increased in 2 mm increments to 53 mm. We reamed in  approximately 40 degrees of abduction, 20 degrees anteversion. A 54 mm  pinnacle acetabular shell was then impacted in anatomic position under  fluoroscopic guidance with excellent purchase. We did not need to place  any additional dome screws. A 36 mm neutral + 4 marathon liner was then  placed into the acetabular shell.       The femoral lift was then placed along the lateral aspect of the femur  just distal to the vastus ridge. The leg was  externally rotated and capsule  was stripped off the inferior aspect of the femoral neck down to the  level of the lesser trochanter, this was done with electrocautery. The femur was lifted after this was performed. The  leg was then placed in an extended and adducted position essentially delivering the femur. We also removed the capsule superiorly and the piriformis from the piriformis fossa to gain  excellent exposure of the  proximal femur. Rongeur was used to remove some cancellous bone to get  into the lateral portion of the proximal femur for placement of the  initial starter reamer. The starter broaches was placed  the starter broach  and was shown to go down the center of the canal. Broaching  with the Actis system was then performed starting at size 0  coursing  Up to size 6. A size 6 had excellent torsional and rotational  and axial stability. The trial standard offset neck was then placed  with a 36  + 5 trial head. The hip was then reduced. We confirmed that  the stem was in the canal both on AP and lateral x-rays. It also has excellent sizing. The hip was reduced with outstanding stability through full extension and full external rotation.. AP pelvis was taken and the leg lengths were measured and found to be equal. Hip was then dislocated again and the femoral head and neck removed. The  femoral broach was removed. Size 6 Actis stem with a standard offset  neck was then impacted into the femur following native anteversion. Has  excellent purchase in the canal. Excellent torsional and rotational and  axial stability. It is confirmed to be in the canal on AP and lateral  fluoroscopic views. The 36 + 5 ceramic head was placed and the hip  reduced with outstanding stability. Again AP pelvis was taken and it  confirmed that the leg lengths were equal. The wound was then copiously  irrigated with saline solution and the capsule reattached and repaired  with Ethibond suture. 30 ml of .25% Bupivicaine was  injected into the capsule and into the edge of the tensor fascia lata as well as subcutaneous tissue. The fascia overlying the tensor fascia lata was then closed with a running #1 V-Loc. Subcu was closed with interrupted 2-0 Vicryl and subcuticular running 4-0 Monocryl. Incision was cleaned  and dried. Steri-Strips and a bulky sterile dressing applied. The patient was awakened and transported to  recovery in stable condition.        Please note that a surgical assistant was a medical necessity for this procedure to perform it in a safe and expeditious manner. Assistant was necessary to provide appropriate retraction of vital neurovascular structures and to prevent femoral fracture and allow for anatomic placement of the prosthesis.  Liliane Rei, M.D.

## 2023-08-15 NOTE — Discharge Instructions (Addendum)
 Benjamin Rei, MD Total Joint Specialist [facility] [address] [place], [place] [address] [phone]  ANTERIOR APPROACH TOTAL HIP REPLACEMENT POSTOPERATIVE DIRECTIONS     Hip Rehabilitation, Guidelines Following Surgery  The results of a hip operation are greatly improved after range of motion and muscle strengthening exercises. Follow all safety measures which are given to protect your hip. If any of these exercises cause increased pain or swelling in your joint, decrease the amount until you are comfortable again. Then slowly increase the exercises. Call your caregiver if you have problems or questions.   BLOOD CLOT PREVENTION Take a 2.5 mg Eliquis twice a day for three weeks following surgery. Then take an 81 mg Aspirin once a day for three weeks. Then discontinue Aspirin. You may resume your vitamins/supplements once you have discontinued the Eliquis Do not take any NSAIDs (Advil, Aleve, Ibuprofen, Meloxicam, etc.) until you have discontinued the Eliquis   HOME CARE INSTRUCTIONS  Remove items at home which could result in a fall. This includes throw rugs or furniture in walking pathways.  ICE to the affected hip as frequently as 20-30 minutes an hour and then as needed for pain and swelling. Continue to use ice on the hip for pain and swelling from surgery. You may notice swelling that will progress down to the foot and ankle. This is normal after surgery. Elevate the leg when you are not up walking on it.   Continue to use the breathing machine which will help keep your temperature down.  It is common for your temperature to cycle up and down following surgery, especially at night when you are not up moving around and exerting yourself.  The breathing machine keeps your lungs expanded and your temperature down.  DIET You may resume your previous home diet once your are discharged from the hospital.  DRESSING / WOUND CARE / SHOWERING You have an  adhesive waterproof bandage over the incision. Leave this in place until your first follow-up appointment. Once you remove this you will not need to place another bandage.  You may begin showering 3 days following surgery, but do not submerge the incision under water.  ACTIVITY For the first 3-5 days, it is important to rest and keep the operative leg elevated. You should, as a general rule, rest for 50 minutes and walk/stretch for 10 minutes per hour. After 5 days, you may slowly increase activity as tolerated.  Perform the exercises you were provided twice a day for about 15-20 minutes each session. Begin these 2 days following surgery. Walk with your walker as instructed. Use the walker until you are comfortable transitioning to a cane. Walk with the cane in the opposite hand of the operative leg. You may discontinue the cane once you are comfortable and walking steadily. Avoid periods of inactivity such as sitting longer than an hour when not asleep. This helps prevent blood clots.  Do not drive a car for 6 weeks or until released by your surgeon.  Do not drive while taking narcotics.  TED HOSE STOCKINGS Wear the elastic stockings on both legs for three weeks following surgery during the day. You may remove them at night while sleeping.  WEIGHT BEARING Weight bearing as tolerated with assist device (walker, cane, etc) as directed, use it as long as suggested by your surgeon or therapist, typically at least 4-6 weeks.  POSTOPERATIVE CONSTIPATION PROTOCOL Constipation - defined medically as fewer than three stools per week and severe constipation as less  than one stool per week.  One of the most common issues patients have following surgery is constipation.  Even if you have a regular bowel pattern at home, your normal regimen is likely to be disrupted due to multiple reasons following surgery.  Combination of anesthesia, postoperative narcotics, change in appetite and fluid intake all can  affect your bowels.  In order to avoid complications following surgery, here are some recommendations in order to help you during your recovery period.  Colace (docusate) - Pick up an over-the-counter form of Colace or another stool softener and take twice a day as long as you are requiring postoperative pain medications.  Take with a full glass of water daily.  If you experience loose stools or diarrhea, hold the colace until you stool forms back up.  If your symptoms do not get better within 1 week or if they get worse, check with your doctor. Dulcolax (bisacodyl) - Pick up over-the-counter and take as directed by the product packaging as needed to assist with the movement of your bowels.  Take with a full glass of water.  Use this product as needed if not relieved by Colace only.  MiraLax (polyethylene glycol) - Pick up over-the-counter to have on hand.  MiraLax is a solution that will increase the amount of water in your bowels to assist with bowel movements.  Take as directed and can mix with a glass of water, juice, soda, coffee, or tea.  Take if you go more than two days without a movement.Do not use MiraLax more than once per day. Call your doctor if you are still constipated or irregular after using this medication for 7 days in a row.  If you continue to have problems with postoperative constipation, please contact the office for further assistance and recommendations.  If you experience "the worst abdominal pain ever" or develop nausea or vomiting, please contact the office immediatly for further recommendations for treatment.  ITCHING  If you experience itching with your medications, try taking only a single pain pill, or even half a pain pill at a time.  You can also use Benadryl over the counter for itching or also to help with sleep.   MEDICATIONS See your medication summary on the "After Visit Summary" that the nursing staff will review with you prior to discharge.  You may have some home  medications which will be placed on hold until you complete the course of blood thinner medication.  It is important for you to complete the blood thinner medication as prescribed by your surgeon.  Continue your approved medications as instructed at time of discharge.  PRECAUTIONS If you experience chest pain or shortness of breath - call 911 immediately for transfer to the hospital emergency department.  If you develop a fever greater that 101 F, purulent drainage from wound, increased redness or drainage from wound, foul odor from the wound/dressing, or calf pain - CONTACT YOUR SURGEON.                                                   FOLLOW-UP APPOINTMENTS Make sure you keep all of your appointments after your operation with your surgeon and caregivers. You should call the office at the above phone number and make an appointment for approximately two weeks after the date of your surgery or on the date  instructed by your surgeon outlined in the "After Visit Summary".  RANGE OF MOTION AND STRENGTHENING EXERCISES  These exercises are designed to help you keep full movement of your hip joint. Follow your caregiver's or physical therapist's instructions. Perform all exercises about fifteen times, three times per day or as directed. Exercise both hips, even if you have had only one joint replacement. These exercises can be done on a training (exercise) mat, on the floor, on a table or on a bed. Use whatever works the best and is most comfortable for you. Use music or television while you are exercising so that the exercises are a pleasant break in your day. This will make your life better with the exercises acting as a break in routine you can look forward to.  Lying on your back, slowly slide your foot toward your buttocks, raising your knee up off the floor. Then slowly slide your foot back down until your leg is straight again.  Lying on your back spread your legs as far apart as you can without causing  discomfort.  Lying on your side, raise your upper leg and foot straight up from the floor as far as is comfortable. Slowly lower the leg and repeat.  Lying on your back, tighten up the muscle in the front of your thigh (quadriceps muscles). You can do this by keeping your leg straight and trying to raise your heel off the floor. This helps strengthen the largest muscle supporting your knee.  Lying on your back, tighten up the muscles of your buttocks both with the legs straight and with the knee bent at a comfortable angle while keeping your heel on the floor.   POST-OPERATIVE OPIOID TAPER INSTRUCTIONS: It is important to wean off of your opioid medication as soon as possible. If you do not need pain medication after your surgery it is ok to stop day one. Opioids include: Codeine, Hydrocodone (Norco, Vicodin), Oxycodone (Percocet, oxycontin ) and hydromorphone amongst others.  Long term and even short term use of opiods can cause: Increased pain response Dependence Constipation Depression Respiratory depression And more.  Withdrawal symptoms can include Flu like symptoms Nausea, vomiting And more Techniques to manage these symptoms Hydrate well Eat regular healthy meals Stay active Use relaxation techniques(deep breathing, meditating, yoga) Do Not substitute Alcohol to help with tapering If you have been on opioids for less than two weeks and do not have pain than it is ok to stop all together.  Plan to wean off of opioids This plan should start within one week post op of your joint replacement. Maintain the same interval or time between taking each dose and first decrease the dose.  Cut the total daily intake of opioids by one tablet each day Next start to increase the time between doses. The last dose that should be eliminated is the evening dose.   IF YOU ARE TRANSFERRED TO A SKILLED REHAB FACILITY If the patient is transferred to a skilled rehab facility following release from the  hospital, a list of the current medications will be sent to the facility for the patient to continue.  When discharged from the skilled rehab facility, please have the facility set up the patient's Home Health Physical Therapy prior to being released. Also, the skilled facility will be responsible for providing the patient with their medications at time of release from the facility to include their pain medication, the muscle relaxants, and their blood thinner medication. If the patient is still at the rehab facility at  time of the two week follow up appointment, the skilled rehab facility will also need to assist the patient in arranging follow up appointment in our office and any transportation needs.  MAKE SURE YOU:  Understand these instructions.  Get help right away if you are not doing well or get worse.    DENTAL ANTIBIOTICS:  In most cases prophylactic antibiotics for Dental procdeures after total joint surgery are not necessary.  Exceptions are as follows:  1. History of prior total joint infection  2. Severely immunocompromised (Organ Transplant, cancer chemotherapy, Rheumatoid biologic meds such as Humera)  3. Poorly controlled diabetes (A1C &gt; 8.0, blood glucose over 200)  If you have one of these conditions, contact your surgeon for an antibiotic prescription, prior to your dental procedure.    Pick up stool softner and laxative for home use following surgery while on pain medications. Do not submerge incision under water. Please use good hand washing techniques while changing dressing each day. May shower starting three days after surgery. Please use a clean towel to pat the incision dry following showers. Continue to use ice for pain and swelling after surgery. Do not use any lotions or creams on the incision until instructed by your surgeon. ______________________________________________  Information on my medicine - ELIQUIS (apixaban)  This medication education  was reviewed with me or my healthcare representative as part of my discharge preparation.    Why was Eliquis prescribed for you? Eliquis was prescribed for you to reduce the risk of blood clots forming after orthopedic surgery.    What do You need to know about Eliquis? Take your Eliquis TWICE DAILY - one tablet in the morning and one tablet in the evening with or without food.  It would be best to take the dose about the same time each day.  If you have difficulty swallowing the tablet whole please discuss with your pharmacist how to take the medication safely.  Take Eliquis exactly as prescribed by your doctor and DO NOT stop taking Eliquis without talking to the doctor who prescribed the medication.  Stopping without other medication to take the place of Eliquis may increase your risk of developing a clot.  After discharge, you should have regular check-up appointments with your healthcare provider that is prescribing your Eliquis.  What do you do if you miss a dose? If a dose of ELIQUIS is not taken at the scheduled time, take it as soon as possible on the same day and twice-daily administration should be resumed.  The dose should not be doubled to make up for a missed dose.  Do not take more than one tablet of ELIQUIS at the same time.  Important Safety Information A possible side effect of Eliquis is bleeding. You should call your healthcare provider right away if you experience any of the following: Bleeding from an injury or your nose that does not stop. Unusual colored urine (red or dark brown) or unusual colored stools (red or black). Unusual bruising for unknown reasons. A serious fall or if you hit your head (even if there is no bleeding).  Some medicines may interact with Eliquis and might increase your risk of bleeding or clotting while on Eliquis. To help avoid this, consult your healthcare provider or pharmacist prior to using any new prescription or  non-prescription medications, including herbals, vitamins, non-steroidal anti-inflammatory drugs (NSAIDs) and supplements.  This website has more information on Eliquis (apixaban): http://www.eliquis.com/eliquis/home

## 2023-08-15 NOTE — Transfer of Care (Signed)
 Immediate Anesthesia Transfer of Care Note  Patient: Benjamin [name]  Procedure(s) Performed: ARTHROPLASTY, HIP, TOTAL, ANTERIOR APPROACH (Left: Hip)  Patient Location: PACU  Anesthesia Type:Spinal  Level of Consciousness: drowsy and patient cooperative  Airway & Oxygen Therapy: Patient Spontanous Breathing and Patient connected to face mask oxygen  Post-op Assessment: Report given to RN and Post -op Vital signs reviewed and stable  Post vital signs: Reviewed and stable  Last Vitals:  Vitals Value Taken Time  BP 125/67 08/15/23 1111  Temp    Pulse 70 08/15/23 1113  Resp 8 08/15/23 1113  SpO2 100 % 08/15/23 1113  Vitals shown include unfiled device data.  Last Pain:  Vitals:   08/15/23 0756  TempSrc: Oral  PainSc: 6          Complications: No notable events documented.

## 2023-08-15 NOTE — Interval H&P Note (Signed)
 History and Physical Interval Note:  08/15/2023 8:15 AM  Benjamin [name]  has presented today for surgery, with the diagnosis of Left Hip osteoarthritits.  The various methods of treatment have been discussed with the patient and family. After consideration of risks, benefits and other options for treatment, the patient has consented to  Procedure(s): ARTHROPLASTY, HIP, TOTAL, ANTERIOR APPROACH (Left) as a surgical intervention.  The patient's history has been reviewed, patient examined, no change in status, stable for surgery.  I have reviewed the patient's chart and labs.  Questions were answered to the patient's satisfaction.     Samuel Crock [name]

## 2023-08-15 NOTE — Plan of Care (Signed)
   Problem: Coping: Goal: Level of anxiety will decrease Outcome: Progressing   Problem: Pain Managment: Goal: General experience of comfort will improve and/or be controlled Outcome: Progressing   Problem: Safety: Goal: Ability to remain free from injury will improve Outcome: Progressing

## 2023-08-15 NOTE — Anesthesia Postprocedure Evaluation (Signed)
 Anesthesia Post Note  Patient: Benjamin [name]  Procedure(s) Performed: ARTHROPLASTY, HIP, TOTAL, ANTERIOR APPROACH (Left: Hip)     Patient location during evaluation: PACU Anesthesia Type: MAC and Spinal Level of consciousness: awake and alert Pain management: pain level controlled Vital Signs Assessment: post-procedure vital signs reviewed and stable Respiratory status: spontaneous breathing, nonlabored ventilation and respiratory function stable Cardiovascular status: stable and blood pressure returned to baseline Postop Assessment: no apparent nausea or vomiting Anesthetic complications: no   No notable events documented.                  Godson Pollan

## 2023-08-15 NOTE — Progress Notes (Signed)
 Notified on-call provider [facility] [place], [place]) of patients poor pain control. Oxycodone  d/c'ed and po dilaudid  ordered, additional one time dose of IV dilaudid  also ordered to be given with first 4mg  dose of po dilaudid , robaxin  dosage also increased, vss, patient in no acute respiratory distress, continuous pulse ox on, ice packs to left hip, leg repositioned with a pillow, scd's on, encouraged patient to practice deep breathing with his incentive spirometer as he is holding his breath due to the pain, will continue to monitor for decrease in pain and medicate as needed when meds are available.

## 2023-08-15 NOTE — Evaluation (Signed)
 Physical Therapy Evaluation Patient Details Name: Benjamin [name] MRN: [id number] DOB: 11-15-1965 Today's Date: 08/15/2023  History of Present Illness  58 yo male s/p L THA-DA 08/15/23. Hx of polysubstance abuse, chronic pain, herniated discs, anemia, MDD  Clinical Impression  On eval POD 0, pt required Min A for mobility. He walked ~50 feet with a RW. Significant L hip pain with activity. He also reported back pain-chronic and discomfort at catheter site. Pain rated 9/10. Pt assisted into recliner for change in position. He preferred to leave his LEs/feet down for a bit. Will plan to follow and progress activity as tolerated. Plan is for home with HEP.         If plan is discharge home, recommend the following: A little help with walking and/or transfers;A little help with bathing/dressing/bathroom;Assistance with cooking/housework;Assist for transportation;Help with stairs or ramp for entrance   Can travel by private vehicle        Equipment Recommendations Rolling walker (2 wheels)  Recommendations for Other Services       Functional Status Assessment Patient has had a recent decline in their functional status and demonstrates the ability to make significant improvements in function in a reasonable and predictable amount of time.     Precautions / Restrictions Precautions Precautions: Fall Restrictions Weight Bearing Restrictions Per Provider Order: No Other Position/Activity Restrictions: WBAT      Mobility  Bed Mobility Overal bed mobility: Needs Assistance Bed Mobility: Supine to Sit     Supine to sit: Min assist, HOB elevated, Used rails     General bed mobility comments: Assist for L LE. Increased time. Cues provided.    Transfers Overall transfer level: Needs assistance Equipment used: Rolling walker (2 wheels) Transfers: Sit to/from Stand Sit to Stand: Min assist, From elevated surface           General transfer comment: Cues for safety, technique, hand  placement. Assist to rise, steady, control descent to low chair    Ambulation/Gait Ambulation/Gait assistance: Min assist Gait Distance (Feet): 50 Feet Assistive device: Rolling walker (2 wheels) Gait Pattern/deviations: Decreased stride length, Antalgic       General Gait Details: Heavy reliance on UEs and RW. Cues for safety, sequencing. Assist to stabilize throughut distance  Stairs            Wheelchair Mobility     Tilt Bed    Modified Rankin (Stroke Patients Only)       Balance Overall balance assessment: Needs assistance         Standing balance support: Bilateral upper extremity supported, During functional activity, Reliant on assistive device for balance Standing balance-Leahy Scale: Poor                               Pertinent Vitals/Pain Pain Assessment Pain Assessment: 0-10 Pain Score: 9  Pain Location: L hip and discomfort at catheter site Pain Descriptors / Indicators: Grimacing, Operative site guarding Pain Intervention(s): Limited activity within patient's tolerance, Monitored during session, Ice applied, Repositioned, Patient requesting pain meds-RN notified    Home Living Family/patient expects to be discharged to:: Private residence Living Arrangements: Spouse/significant other Available Help at Discharge: Family Type of Home: House Home Access: Stairs to enter Entrance Stairs-Rails: Right Entrance Stairs-Number of Steps: 1 flight Alternate Level Stairs-Number of Steps: 1 flight Home Layout: Multi-level;Able to live on main level with bedroom/bathroom Home Equipment: Jeananne Mighty - single point;Shower seat      Prior  Function Prior Level of Function : Independent/Modified Independent             Mobility Comments: using cane       Extremity/Trunk Assessment   Upper Extremity Assessment Upper Extremity Assessment: Overall WFL for tasks assessed    Lower Extremity Assessment Lower Extremity Assessment: Generalized  weakness    Cervical / Trunk Assessment Cervical / Trunk Assessment: Normal  Communication   Communication Communication: No apparent difficulties    Cognition Arousal: Alert Behavior During Therapy: WFL for tasks assessed/performed   PT - Cognitive impairments: No apparent impairments                         Following commands: Intact       Cueing Cueing Techniques: Verbal cues     General Comments      Exercises     Assessment/Plan    PT Assessment Patient needs continued PT services  PT Problem List Decreased strength;Decreased range of motion;Decreased activity tolerance;Decreased balance;Decreased mobility;Decreased knowledge of use of DME;Pain       PT Treatment Interventions DME instruction;Gait training;Stair training;Functional mobility training;Therapeutic activities;Therapeutic exercise;Patient/family education;Balance training    PT Goals (Current goals can be found in the Care Plan section)  Acute Rehab PT Goals Patient Stated Goal: less pain. regain independence/PLOF PT Goal Formulation: With patient Time For Goal Achievement: 08/29/23 Potential to Achieve Goals: Good    Frequency 7X/week     Co-evaluation               AM-PAC PT "6 Clicks" Mobility  Outcome Measure Help needed turning from your back to your side while in a flat bed without using bedrails?: A Little Help needed moving from lying on your back to sitting on the side of a flat bed without using bedrails?: A Little Help needed moving to and from a bed to a chair (including a wheelchair)?: A Little Help needed standing up from a chair using your arms (e.g., wheelchair or bedside chair)?: A Little Help needed to walk in hospital room?: A Little Help needed climbing 3-5 steps with a railing? : A Lot 6 Click Score: 17    End of Session Equipment Utilized During Treatment: Gait belt Activity Tolerance: Patient limited by pain Patient left: in chair;with call  bell/phone within reach   PT Visit Diagnosis: Pain;Other abnormalities of gait and mobility (R26.89) Pain - Right/Left: Left Pain - part of body: Hip (back)    Time: 1610-9604 PT Time Calculation (min) (ACUTE ONLY): 32 min   Charges:   PT Evaluation $PT Eval Low Complexity: 1 Low PT Treatments $Gait Training: 8-22 mins PT General Charges $$ ACUTE PT VISIT: 1 Visit           Tanda Falter, PT Acute Rehabilitation  Office: [phone]

## 2023-08-15 NOTE — Anesthesia Preprocedure Evaluation (Addendum)
 Anesthesia Evaluation  Patient identified by MRN, date of birth, ID band Patient awake    Reviewed: Allergy & Precautions, NPO status , Patient's Chart, lab work & pertinent test results  History of Anesthesia Complications Negative for: history of anesthetic complications  Airway Mallampati: II  TM Distance: >3 FB Neck ROM: Full    Dental  (+) Teeth Intact, Dental Advisory Given   Pulmonary neg shortness of breath, neg sleep apnea, neg COPD, neg recent URI, former smoker   breath sounds clear to auscultation       Cardiovascular hypertension, Pt. on medications (-) angina (-) Past MI and (-) CHF  Rhythm:Regular  Left ventricle: The cavity size was normal. Systolic function was    normal. The estimated ejection fraction was in the range of 60%    to 65%. Wall motion was normal; there were no regional wall    motion abnormalities. Left ventricular diastolic function    parameters were normal.  - Aortic valve: There was no regurgitation.  - Aortic root: The aortic root was normal in size.  - Mitral valve: There was mild regurgitation.  - Right ventricle: The cavity size was normal. Wall thickness was    normal. Systolic function was normal.  - Right atrium: The atrium was normal in size.  - Tricuspid valve: There was moderate regurgitation.  - Pulmonary arteries: Systolic pressure was moderately increased.    PA peak pressure: 52 mm Hg (S).  - Inferior vena cava: The vessel was normal in size.  - Pericardium, extracardiac: There was no pericardial effusion.     Neuro/Psych  PSYCHIATRIC DISORDERS Anxiety Depression    negative neurological ROS     GI/Hepatic   Endo/Other  negative endocrine ROS  Lab Results      Component                Value               Date                      HGBA1C                   5.6                 08/31/2016             Renal/GU CRFRenal diseaseLab Results      Component                Value                Date                      NA                       136                 06/20/2022                K                        3.4 (L)             06/20/2022                CO2  25                  06/20/2022                GLUCOSE                  98                  06/20/2022                BUN                      24 (H)              06/20/2022                CREATININE               1.61 (H)            06/20/2022                CALCIUM                   9.1                 06/20/2022                GFRNONAA                 51 (L)              06/20/2022                Musculoskeletal  (+) Arthritis ,    Abdominal   Peds  Hematology  (+) Blood dyscrasia, anemia Lab Results      Component                Value               Date                      WBC                      7.8                 06/20/2022                HGB                      12.7 (L)            06/20/2022                HCT                      39.0                06/20/2022                MCV                      74.6 (L)            06/20/2022                PLT                      233  06/20/2022              Anesthesia Other Findings   Reproductive/Obstetrics                             Anesthesia Physical Anesthesia Plan  ASA: 3  Anesthesia Plan: MAC and Spinal   Post-op Pain Management: Ofirmev  IV (intra-op)*   Induction: Intravenous  PONV Risk Score and Plan: 1 and Propofol  infusion and Treatment may vary due to age or medical condition  Airway Management Planned: Nasal Cannula, Natural Airway and Simple Face Mask  Additional Equipment: None  Intra-op Plan:   Post-operative Plan:   Informed Consent:   Plan Discussed with: CRNA  Anesthesia Plan Comments:         Anesthesia Quick Evaluation

## 2023-08-16 ENCOUNTER — Encounter (HOSPITAL_COMMUNITY): Payer: Self-pay | Admitting: Orthopedic Surgery

## 2023-08-16 ENCOUNTER — Other Ambulatory Visit (HOSPITAL_COMMUNITY): Payer: Self-pay

## 2023-08-16 DIAGNOSIS — M1612 Unilateral primary osteoarthritis, left hip: Secondary | ICD-10-CM | POA: Diagnosis not present

## 2023-08-16 LAB — BASIC METABOLIC PANEL WITH GFR
Anion gap: 10 (ref 5–15)
BUN: 21 mg/dL — ABNORMAL HIGH (ref 6–20)
CO2: 23 mmol/L (ref 22–32)
Calcium: 8.8 mg/dL — ABNORMAL LOW (ref 8.9–10.3)
Chloride: 101 mmol/L (ref 98–111)
Creatinine, Ser: 1.23 mg/dL (ref 0.61–1.24)
GFR, Estimated: >60 mL/min (ref >60)
Glucose, Bld: 128 mg/dL — ABNORMAL HIGH (ref 70–99)
Potassium: 4.2 mmol/L (ref 3.5–5.1)
Sodium: 134 mmol/L — ABNORMAL LOW (ref 135–145)

## 2023-08-16 LAB — CBC
HCT: 41.1 % (ref 39.0–52.0)
Hemoglobin: 12.4 g/dL — ABNORMAL LOW (ref 13.0–17.0)
MCH: 24.1 pg — ABNORMAL LOW (ref 26.0–34.0)
MCHC: 30.2 g/dL (ref 30.0–36.0)
MCV: 79.8 fL — ABNORMAL LOW (ref 80.0–100.0)
Platelets: 210 10*3/uL (ref 150–400)
RBC: 5.15 MIL/uL (ref 4.22–5.81)
RDW: 14.6 % (ref 11.5–15.5)
WBC: 16.3 10*3/uL — ABNORMAL HIGH (ref 4.0–10.5)
nRBC: 0 % (ref 0.0–0.2)

## 2023-08-16 MED ORDER — APIXABAN 2.5 MG PO TABS
2.5000 mg | ORAL_TABLET | Freq: Two times a day (BID) | ORAL | 0 refills | Status: AC
  Filled 2023-08-16: qty 42, 21d supply, fill #0

## 2023-08-16 MED ORDER — HYDROMORPHONE HCL 2 MG PO TABS
2.0000 mg | ORAL_TABLET | Freq: Four times a day (QID) | ORAL | 0 refills | Status: AC | PRN
  Filled 2023-08-16: qty 42, 6d supply, fill #0

## 2023-08-16 MED ORDER — METHOCARBAMOL 750 MG PO TABS
750.0000 mg | ORAL_TABLET | Freq: Four times a day (QID) | ORAL | 0 refills | Status: AC | PRN
  Filled 2023-08-16: qty 40, 10d supply, fill #0

## 2023-08-16 MED ORDER — ONDANSETRON HCL 4 MG PO TABS
4.0000 mg | ORAL_TABLET | Freq: Four times a day (QID) | ORAL | 0 refills | Status: AC | PRN
  Filled 2023-08-16: qty 20, 5d supply, fill #0

## 2023-08-16 NOTE — Progress Notes (Addendum)
   Subjective: 1 Day Post-Op Procedure(s) (LRB): ARTHROPLASTY, HIP, TOTAL, ANTERIOR APPROACH (Left) Patient seen in rounds by Dr. France Ina. Patient is well, and has had no acute complaints or problems. Denies SOB or chest pain. Denies calf pain. Foley cath removed this AM. Patient reports pain as severe but improving with change in medications last night. Worked with physical therapy yesterday and ambulated 50'. We will continue physical therapy today.   Objective: Vital signs in last 24 hours: Temp:  [97.5 F (36.4 C)-98.6 F (37 C)] 98.6 F (37 C) (05/22 0442) Pulse Rate:  [62-77] 71 (05/22 0442) Resp:  [7-17] 17 (05/22 0442) BP: (119-173)/(67-98) 173/84 (05/22 0442) SpO2:  [93 %-100 %] 100 % (05/22 0442) Weight:  [97 kg-97.5 kg] 97 kg (05/21 0756)  Intake/Output from previous day:  Intake/Output Summary (Last 24 hours) at 08/16/2023 0743 Last data filed at 08/16/2023 0600 Gross per 24 hour  Intake 4881.13 ml  Output 2700 ml  Net 2181.13 ml     Intake/Output this shift: No intake/output data recorded.  Labs: Recent Labs    08/16/23 0321  HGB 12.4*   Recent Labs    08/16/23 0321  WBC 16.3*  RBC 5.15  HCT 41.1  PLT 210   Recent Labs    08/16/23 0321  NA 134*  K 4.2  CL 101  CO2 23  BUN 21*  CREATININE 1.23  GLUCOSE 128*  CALCIUM  8.8*   No results for input(s): "LABPT", "INR" in the last 72 hours.  Exam: General - Patient is Alert and Oriented Extremity - Neurologically intact Neurovascular intact Sensation intact distally Dorsiflexion/Plantar flexion intact Dressing - dressing C/D/I Motor Function - intact, moving foot and toes well on exam.  Past Medical History:  Diagnosis Date   Arthritis    Chronic back pain    [facility]   CKD (chronic kidney disease), stage III (HCC) 10/2017   Depression    Erectile dysfunction    History of substance abuse (HCC)    Hx of suicide attempt    Hypertension      Assessment/Plan: 1 Day Post-Op Procedure(s) (LRB): ARTHROPLASTY, HIP, TOTAL, ANTERIOR APPROACH (Left) Principal Problem:   OA (osteoarthritis) of hip Active Problems:   Primary osteoarthritis of left hip  Estimated body mass index is 27.46 kg/m as calculated from the following:   Height as of this encounter: 6\' 2"  (1.88 m).   Weight as of this encounter: 97 kg. Advance diet Up with therapy D/C IV fluids  DVT Prophylaxis - Eliquis Weight bearing as tolerated.  Continue physical therapy. Possible discharge home today pending progress and pain control but low threshold to keep additional night in hospital. Plan for HEP once discharged.  Alfreda Imus, PA-C Orthopedic Surgery 08/16/2023, 7:43 AM

## 2023-08-16 NOTE — Progress Notes (Signed)
 Discharge paperwork printed and reviewed with the pt. Medications delivered in the room and handed to pt by pharmacy. Wife provided transportation.

## 2023-08-16 NOTE — Progress Notes (Addendum)
 Physical Therapy Treatment Patient Details Name: Benjamin [name] MRN: [id number] DOB: 1965-12-24 Today's Date: 08/16/2023   History of Present Illness 58 yo male s/p L THA-DA 08/15/23. Hx of polysubstance abuse, chronic pain, herniated discs, anemia, MDD    PT Comments  Pt making excellent progress and meeting goals this am. Ready for d/c with family assisting prn from PT standpoint. Reviewed THA HEP and progression of activity at home. Sent secure chat to ortho PA regarding pt status and potential  d/c readiness.    If plan is discharge home, recommend the following: A little help with walking and/or transfers;A little help with bathing/dressing/bathroom;Assistance with cooking/housework;Assist for transportation;Help with stairs or ramp for entrance   Can travel by private vehicle        Equipment Recommendations  Rolling walker (2 wheels)    Recommendations for Other Services       Precautions / Restrictions Precautions Precautions: None Restrictions Weight Bearing Restrictions Per Provider Order: No LLE Weight Bearing Per Provider Order: Weight bearing as tolerated     Mobility  Bed Mobility Overal bed mobility: Needs Assistance Bed Mobility: Supine to Sit, Sit to Supine     Supine to sit: Supervision Sit to supine: Supervision   General bed mobility comments: able to self assist with LLE    Transfers Overall transfer level: Needs assistance Equipment used: Rolling walker (2 wheels) Transfers: Sit to/from Stand Sit to Stand: Supervision           General transfer comment: cues for hand placement and LLE position    Ambulation/Gait Ambulation/Gait assistance: Contact guard assist, Supervision Gait Distance (Feet): 150 Feet Assistive device: Rolling walker (2 wheels) Gait Pattern/deviations: Step-through pattern, Decreased stride length       General Gait Details: initial cues for sequence; improved wt shift to LLE, able to flex knee during swing phase  and heel strike on initial contact   Stairs Stairs: Yes Stairs assistance: Contact guard assist Stair Management: Step to pattern, Forwards, One rail Right Number of Stairs: 3 (x2) General stair comments: cues for sequence and technique; verbally reviewed use of rail and cane vs rail and crutch   Wheelchair Mobility     Tilt Bed    Modified Rankin (Stroke Patients Only)       Balance                                            Communication Communication Communication: No apparent difficulties  Cognition Arousal: Alert Behavior During Therapy: WFL for tasks assessed/performed   PT - Cognitive impairments: No apparent impairments                                Cueing Cueing Techniques: Verbal cues  Exercises      General Comments        Pertinent Vitals/Pain Pain Assessment Pain Assessment: 0-10 Pain Score: 8  Pain Location: L hip Pain Descriptors / Indicators: Grimacing, Operative site guarding, Tightness Pain Intervention(s): Limited activity within patient's tolerance, Monitored during session, Repositioned, Premedicated before session    Home Living                          Prior Function            PT Goals (current goals can now  be found in the care plan section) Acute Rehab PT Goals Patient Stated Goal: less pain. regain independence/PLOF PT Goal Formulation: With patient Time For Goal Achievement: 08/29/23 Potential to Achieve Goals: Good Progress towards PT goals: Progressing toward goals    Frequency    7X/week      PT Plan      Co-evaluation              AM-PAC PT "6 Clicks" Mobility   Outcome Measure  Help needed turning from your back to your side while in a flat bed without using bedrails?: A Little Help needed moving from lying on your back to sitting on the side of a flat bed without using bedrails?: A Little Help needed moving to and from a bed to a chair (including a  wheelchair)?: A Little Help needed standing up from a chair using your arms (e.g., wheelchair or bedside chair)?: A Little Help needed to walk in hospital room?: A Little Help needed climbing 3-5 steps with a railing? : A Little 6 Click Score: 18    End of Session Equipment Utilized During Treatment: Gait belt Activity Tolerance: Patient tolerated treatment well Patient left: with call bell/phone within reach;in bed;with bed alarm set;with nursing/sitter in room   PT Visit Diagnosis: Pain;Other abnormalities of gait and mobility (R26.89) Pain - Right/Left: Left Pain - part of body: Hip     Time: 1210-1241 PT Time Calculation (min) (ACUTE ONLY): 31 min  Charges:    $Gait Training: 23-37 mins PT General Charges $$ ACUTE PT VISIT: 1 Visit                     Jamesmichael Shadd, PT  Acute Rehab Dept ([facility]) [phone]  08/16/2023    [facility] 08/16/2023, 2:16 PM

## 2023-08-16 NOTE — TOC Transition Note (Signed)
 Transition of Care [facility]) - Discharge Note   Patient Details  Name: Benjamin [name] MRN: [id number] Date of Birth: 07-09-1965  [facility] CM/SW Contact:  Delilah Fend, LCSW Phone Number: 08/16/2023, 9:28 AM   Clinical Narrative:     Met with pt who confirms need for RW.  No DME agency preference.  Order placed with [employer] and delivered to room.  Plan for HEP.  No further TOC needs.  Final next level of care: Home/Self Care Barriers to Discharge: No Barriers Identified   Patient Goals and CMS Choice Patient states their goals for this hospitalization and ongoing recovery are:: return home          Discharge Placement                       Discharge Plan and Services Additional resources added to the After Visit Summary for                  DME Arranged: Walker rolling DME Agency: [employer] Date DME Agency Contacted: 08/16/23 Time DME Agency Contacted: [phone] Representative spoke with at DME Agency: Lavonia Powers            Social Drivers of Health (SDOH) Interventions SDOH Screenings   Food Insecurity: No Food Insecurity (08/15/2023)  Housing: Low Risk  (08/15/2023)  Transportation Needs: No Transportation Needs (08/15/2023)  Utilities: Not At Risk (08/15/2023)  Alcohol Screen: Low Risk  (06/20/2022)  Social Connections: Unknown (08/09/2021)   Received from [facility]  Tobacco Use: Medium Risk (08/15/2023)     Readmission Risk Interventions     No data to display

## 2023-08-16 NOTE — Care Management Obs Status (Signed)
 MEDICARE OBSERVATION STATUS NOTIFICATION   Patient Details  Name: Benjamin [name] MRN: [id number] Date of Birth: 04-Mar-1966   Medicare Observation Status Notification Given:  Birda Buffy, LCSW 08/16/2023, 10:38 AM

## 2023-08-16 NOTE — Discharge Summary (Signed)
 Physician Discharge Summary   Patient ID: Benjamin [name] MRN: [id number] DOB/AGE: 07/15/1965 58 y.o.  Admit date: 08/15/2023 Discharge date: 08/16/2023  Primary Diagnosis: Osteoarthritis of the left hip    Admission Diagnoses:  Past Medical History:  Diagnosis Date   Arthritis    Chronic back pain    managed by [facility]   CKD (chronic kidney disease), stage III (HCC) 10/2017   Depression    Erectile dysfunction    History of substance abuse (HCC)    Hx of suicide attempt    Hypertension    Discharge Diagnoses:   Principal Problem:   OA (osteoarthritis) of hip Active Problems:   Primary osteoarthritis of left hip  Estimated body mass index is 27.46 kg/m as calculated from the following:   Height as of this encounter: 6\' 2"  (1.88 m).   Weight as of this encounter: 97 kg.  Procedure:  Procedure(s) (LRB): ARTHROPLASTY, HIP, TOTAL, ANTERIOR APPROACH (Left)   Consults: None  HPI: Benjamin [name] is a 58 y.o. male who has advanced end-  stage arthritis of their Left  hip with progressively worsening pain and dysfunction.The patient has failed nonoperative management and presents for total hip arthroplasty.   Laboratory Data: Admission on 08/15/2023  Component Date Value Ref Range Status   WBC 08/16/2023 16.3 (H)  4.0 - 10.5 K/uL Final   RBC 08/16/2023 5.15  4.22 - 5.81 MIL/uL Final   Hemoglobin 08/16/2023 12.4 (L)  13.0 - 17.0 g/dL Final   HCT 78/29/5621 41.1  39.0 - 52.0 % Final   MCV 08/16/2023 79.8 (L)  80.0 - 100.0 fL Final   MCH 08/16/2023 24.1 (L)  26.0 - 34.0 pg Final   MCHC 08/16/2023 30.2  30.0 - 36.0 g/dL Final   RDW 30/86/5784 14.6  11.5 - 15.5 % Final   Platelets 08/16/2023 210  150 - 400 K/uL Final   nRBC 08/16/2023 0.0  0.0 - 0.2 % Final   Performed at [facility], [address], [place], [place] [address]   Sodium 08/16/2023 134 (L)  135 - 145 mmol/L Final   Potassium 08/16/2023 4.2  3.5 - 5.1 mmol/L Final    Chloride 08/16/2023 101  98 - 111 mmol/L Final   CO2 08/16/2023 23  22 - 32 mmol/L Final   Glucose, Bld 08/16/2023 128 (H)  70 - 99 mg/dL Final   Glucose reference range applies only to samples taken after fasting for at least 8 hours.   BUN 08/16/2023 21 (H)  6 - 20 mg/dL Final   Creatinine, Ser 08/16/2023 1.23  0.61 - 1.24 mg/dL Final   Calcium  08/16/2023 8.8 (L)  8.9 - 10.3 mg/dL Final   GFR, Estimated 08/16/2023 >60  >60 mL/min Final   Comment: (NOTE) Calculated using the CKD-EPI Creatinine Equation (2021)    Anion gap 08/16/2023 10  5 - 15 Final   Performed at [facility], [address] [place], [place] [address]  Hospital Outpatient Visit on 08/06/2023  Component Date Value Ref Range Status   MRSA, PCR 08/06/2023 NEGATIVE  NEGATIVE Final   Staphylococcus aureus 08/06/2023 NEGATIVE  NEGATIVE Final   Comment: (NOTE) The Xpert SA Assay (FDA approved for NASAL specimens in patients 66 years of age and older), is one component of a comprehensive surveillance program. It is not intended to diagnose infection nor to guide or monitor treatment. Performed at [facility], [address] [place], [place] [address]    ABO/RH(D)  08/06/2023 A POS   Final   Antibody Screen 08/06/2023 NEG   Final   Sample Expiration 08/06/2023 08/18/2023,2359   Final   Extend sample reason 08/06/2023    Final                   Value:NO TRANSFUSIONS OR PREGNANCY IN THE PAST 3 MONTHS Performed at [facility], [address] [place], [place] [address]      X-Rays:DG Pelvis Portable Result Date: 08/15/2023 CLINICAL DATA:  Status post left hip replacement. EXAM: PORTABLE PELVIS 1-2 VIEWS COMPARISON:  C-arm images obtained earlier today. FINDINGS: Left hip prosthesis in satisfactory position and alignment. No fracture or dislocation seen. IMPRESSION: Satisfactory postoperative appearance of a left hip prosthesis. Electronically Signed   By: Catherin Closs M.D.   On: 08/15/2023 14:33   DG HIP UNILAT WITH PELVIS 1V LEFT Result Date: 08/15/2023 CLINICAL DATA:  Status post left total hip arthroplasty. EXAM: DG HIP (WITH OR WITHOUT PELVIS) 1V*L* COMPARISON:  Portable pelvis obtained today. FINDINGS: Two C-arm images of the left hip demonstrate a left hip prosthesis in satisfactory position and alignment. No fracture or dislocation seen. IMPRESSION: Satisfactory postoperative appearance of a left hip prosthesis. Electronically Signed   By: Catherin Closs M.D.   On: 08/15/2023 14:32   DG C-Arm 1-60 Min-No Report Result Date: 08/15/2023 Fluoroscopy was utilized by the requesting physician.  No radiographic interpretation.    EKG: Orders placed or performed during the hospital encounter of 06/20/22   ED EKG   ED EKG   EKG     Hospital Course: Benjamin [name] is a 58 y.o. who was admitted to [facility]. They were brought to the operating room on 08/15/2023 and underwent Procedure(s): ARTHROPLASTY, HIP, TOTAL, ANTERIOR APPROACH.  Patient tolerated the procedure well and was later transferred to the recovery room and then to the orthopaedic floor for postoperative care. They were given PO and IV analgesics for pain control following their surgery. They were given 24 hours of postoperative antibiotics of  Anti-infectives (From admission, onward)    Start     Dose/Rate Route Frequency Ordered Stop   08/15/23 1600  ceFAZolin  (ANCEF ) IVPB 2g/100 mL premix        2 g 200 mL/hr over 30 Minutes Intravenous Every 6 hours 08/15/23 1407 08/15/23 2100   08/15/23 0800  ceFAZolin  (ANCEF ) IVPB 2g/100 mL premix        2 g 200 mL/hr over 30 Minutes Intravenous On call to O.R. 08/15/23 9811 08/15/23 0939      and started on DVT prophylaxis in the form of Eliquis.   PT and OT were ordered for total joint protocol. Discharge planning consulted to help with postop disposition and equipment needs.  Patient had a fair night on the evening of surgery. They  started to get up OOB with therapy on POD #0. Pt was seen during rounds and was ready to go home pending progress with therapy. He worked with therapy on POD #1 and was meeting his goals. Pt was discharged to home later that day in stable condition.  Diet: Regular diet Activity: WBAT Follow-up: in 2 weeks Disposition: Home Discharged Condition: stable   Discharge Instructions     Call MD / Call 911   Complete by: As directed    If you experience chest pain or shortness of breath, CALL 911 and be transported to the hospital emergency room.  If you develope a fever above 101 F, pus (  white drainage) or increased drainage or redness at the wound, or calf pain, call your surgeon's office.   Change dressing   Complete by: As directed    You have an adhesive waterproof bandage over the incision. Leave this in place until your first follow-up appointment. Once you remove this you will not need to place another bandage.   Constipation Prevention   Complete by: As directed    Drink plenty of fluids.  Prune juice may be helpful.  You may use a stool softener, such as Colace (over the counter) 100 mg twice a day.  Use MiraLax  (over the counter) for constipation as needed.   Diet - low sodium heart healthy   Complete by: As directed    Do not sit on low chairs, stoools or toilet seats, as it may be difficult to get up from low surfaces   Complete by: As directed    Driving restrictions   Complete by: As directed    No driving for two weeks   Post-operative opioid taper instructions:   Complete by: As directed    POST-OPERATIVE OPIOID TAPER INSTRUCTIONS: It is important to wean off of your opioid medication as soon as possible. If you do not need pain medication after your surgery it is ok to stop day one. Opioids include: Codeine, Hydrocodone (Norco, Vicodin), Oxycodone (Percocet, oxycontin ) and hydromorphone  amongst others.  Long term and even short term use of opiods can cause: Increased pain  response Dependence Constipation Depression Respiratory depression And more.  Withdrawal symptoms can include Flu like symptoms Nausea, vomiting And more Techniques to manage these symptoms Hydrate well Eat regular healthy meals Stay active Use relaxation techniques(deep breathing, meditating, yoga) Do Not substitute Alcohol to help with tapering If you have been on opioids for less than two weeks and do not have pain than it is ok to stop all together.  Plan to wean off of opioids This plan should start within one week post op of your joint replacement. Maintain the same interval or time between taking each dose and first decrease the dose.  Cut the total daily intake of opioids by one tablet each day Next start to increase the time between doses. The last dose that should be eliminated is the evening dose.      TED hose   Complete by: As directed    Use stockings (TED hose) for three weeks on both leg(s).  You may remove them at night for sleeping.   Weight bearing as tolerated   Complete by: As directed       Allergies as of 08/16/2023   No Known Allergies      Medication List     TAKE these medications    amLODipine -valsartan  5-160 MG tablet Commonly known as: EXFORGE  Take 1 tablet by mouth daily.   buPROPion  150 MG 24 hr tablet Commonly known as: WELLBUTRIN  XL Take 150 mg by mouth every morning.   buPROPion  300 MG 24 hr tablet Commonly known as: WELLBUTRIN  XL Take 1 tablet (300 mg total) by mouth daily. For depression   Eliquis 2.5 MG Tabs tablet Generic drug: apixaban Take 1 tablet (2.5 mg total) by mouth every 12 (twelve) hours for 21 days.   ferrous sulfate  325 (65 FE) MG tablet Take 325 mg by mouth daily with breakfast.   HYDROmorphone  2 MG tablet Commonly known as: Dilaudid  Take 1-2 tablets (2-4 mg total) by mouth every 6 (six) hours as needed for up to 5 days for severe pain (pain  score 7-10) (not responding to chronic oxycodone ).    hydrOXYzine  25 MG tablet Commonly known as: ATARAX  Take 1 tablet (25 mg total) by mouth 3 (three) times daily as needed for anxiety.   melatonin 3 MG Tabs tablet Take 2 tablets (6 mg total) by mouth at bedtime. For sleep   methocarbamol  750 MG tablet Commonly known as: ROBAXIN  Take 1 tablet (750 mg total) by mouth every 6 (six) hours as needed for muscle spasms.   mirtazapine  7.5 MG tablet Commonly known as: REMERON  Take 1 tablet (7.5 mg total) by mouth at bedtime. For sleep/depression   multivitamin with minerals Tabs tablet Take 1 tablet by mouth daily.   ondansetron  4 MG tablet Commonly known as: ZOFRAN  Take 1 tablet (4 mg total) by mouth every 6 (six) hours as needed for nausea.   Percocet 10-325 MG tablet Generic drug: oxyCODONE -acetaminophen  Take 1 tablet by mouth 4 (four) times daily as needed for pain.   rosuvastatin  20 MG tablet Commonly known as: CRESTOR  Take 20 mg by mouth daily.   vitamin E 180 MG (400 UNITS) capsule Take 400 Units by mouth daily.               Discharge Care Instructions  (From admission, onward)           Start     Ordered   08/16/23 0000  Weight bearing as tolerated        08/16/23 1257   08/16/23 0000  Change dressing       Comments: You have an adhesive waterproof bandage over the incision. Leave this in place until your first follow-up appointment. Once you remove this you will not need to place another bandage.   08/16/23 1257            Follow-up Information     Liliane Rei, MD. Schedule an appointment as soon as possible for a visit in 2 week(s).   Specialty: Orthopedic Surgery Contact information: [address] [place] [address] [phone]                 Signed: R. Brinton Canavan, PA-C Orthopedic Surgery 08/16/2023, 3:00 PM
